# Patient Record
Sex: Female | Born: 1940 | Race: White | Hispanic: No | Marital: Married | Smoking: Never smoker
Health system: Southern US, Community
[De-identification: ages and names within clinical notes are randomized; demographics above are authoritative.]

## PROBLEM LIST (undated history)

## (undated) DIAGNOSIS — K449 Diaphragmatic hernia without obstruction or gangrene: Secondary | ICD-10-CM

## (undated) DIAGNOSIS — R0600 Dyspnea, unspecified: Secondary | ICD-10-CM

## (undated) DIAGNOSIS — M810 Age-related osteoporosis without current pathological fracture: Secondary | ICD-10-CM

## (undated) DIAGNOSIS — T8859XA Other complications of anesthesia, initial encounter: Secondary | ICD-10-CM

## (undated) DIAGNOSIS — I10 Essential (primary) hypertension: Secondary | ICD-10-CM

## (undated) DIAGNOSIS — K219 Gastro-esophageal reflux disease without esophagitis: Secondary | ICD-10-CM

## (undated) DIAGNOSIS — E039 Hypothyroidism, unspecified: Secondary | ICD-10-CM

## (undated) DIAGNOSIS — M719 Bursopathy, unspecified: Secondary | ICD-10-CM

## (undated) DIAGNOSIS — I499 Cardiac arrhythmia, unspecified: Secondary | ICD-10-CM

## (undated) DIAGNOSIS — I4891 Unspecified atrial fibrillation: Secondary | ICD-10-CM

## (undated) DIAGNOSIS — T4145XA Adverse effect of unspecified anesthetic, initial encounter: Secondary | ICD-10-CM

## (undated) DIAGNOSIS — F419 Anxiety disorder, unspecified: Secondary | ICD-10-CM

## (undated) DIAGNOSIS — K5790 Diverticulosis of intestine, part unspecified, without perforation or abscess without bleeding: Secondary | ICD-10-CM

## (undated) DIAGNOSIS — D126 Benign neoplasm of colon, unspecified: Secondary | ICD-10-CM

## (undated) DIAGNOSIS — G7000 Myasthenia gravis without (acute) exacerbation: Secondary | ICD-10-CM

## (undated) DIAGNOSIS — H409 Unspecified glaucoma: Secondary | ICD-10-CM

## (undated) DIAGNOSIS — K56609 Unspecified intestinal obstruction, unspecified as to partial versus complete obstruction: Secondary | ICD-10-CM

## (undated) DIAGNOSIS — E785 Hyperlipidemia, unspecified: Secondary | ICD-10-CM

## (undated) DIAGNOSIS — N183 Chronic kidney disease, stage 3 unspecified: Secondary | ICD-10-CM

## (undated) DIAGNOSIS — M1990 Unspecified osteoarthritis, unspecified site: Secondary | ICD-10-CM

## (undated) DIAGNOSIS — K602 Anal fissure, unspecified: Secondary | ICD-10-CM

## (undated) DIAGNOSIS — J45909 Unspecified asthma, uncomplicated: Secondary | ICD-10-CM

## (undated) HISTORY — DX: Hyperlipidemia, unspecified: E78.5

## (undated) HISTORY — DX: Diverticulosis of intestine, part unspecified, without perforation or abscess without bleeding: K57.90

## (undated) HISTORY — DX: Unspecified intestinal obstruction, unspecified as to partial versus complete obstruction: K56.609

## (undated) HISTORY — DX: Unspecified glaucoma: H40.9

## (undated) HISTORY — PX: TONSILLECTOMY: SUR1361

## (undated) HISTORY — DX: Myasthenia gravis without (acute) exacerbation: G70.00

## (undated) HISTORY — PX: APPENDECTOMY: SHX54

## (undated) HISTORY — DX: Chronic kidney disease, stage 3 unspecified: N18.30

## (undated) HISTORY — DX: Unspecified osteoarthritis, unspecified site: M19.90

## (undated) HISTORY — PX: EYE SURGERY: SHX253

## (undated) HISTORY — DX: Hypothyroidism, unspecified: E03.9

## (undated) HISTORY — DX: Diaphragmatic hernia without obstruction or gangrene: K44.9

## (undated) HISTORY — DX: Anal fissure, unspecified: K60.2

## (undated) HISTORY — DX: Age-related osteoporosis without current pathological fracture: M81.0

## (undated) HISTORY — DX: Bursopathy, unspecified: M71.9

## (undated) HISTORY — DX: Chronic kidney disease, stage 3 (moderate): N18.3

## (undated) HISTORY — DX: Unspecified asthma, uncomplicated: J45.909

## (undated) HISTORY — PX: BREAST LUMPECTOMY: SHX2

## (undated) HISTORY — PX: ABDOMINAL HYSTERECTOMY: SHX81

## (undated) HISTORY — DX: Benign neoplasm of colon, unspecified: D12.6

## (undated) HISTORY — DX: Essential (primary) hypertension: I10

## (undated) HISTORY — DX: Unspecified atrial fibrillation: I48.91

---

## 1997-11-05 HISTORY — PX: THYROIDECTOMY, PARTIAL: SHX18

## 2005-01-15 ENCOUNTER — Ambulatory Visit: Payer: Self-pay | Admitting: Family Medicine

## 2005-01-22 ENCOUNTER — Ambulatory Visit: Payer: Self-pay | Admitting: Family Medicine

## 2006-01-24 ENCOUNTER — Ambulatory Visit: Payer: Self-pay | Admitting: Family Medicine

## 2006-01-31 ENCOUNTER — Ambulatory Visit: Payer: Self-pay | Admitting: Family Medicine

## 2006-07-17 ENCOUNTER — Ambulatory Visit: Payer: Self-pay | Admitting: Family Medicine

## 2006-09-13 ENCOUNTER — Ambulatory Visit: Payer: Self-pay | Admitting: Family Medicine

## 2007-02-14 ENCOUNTER — Ambulatory Visit: Payer: Self-pay | Admitting: Family Medicine

## 2007-02-14 LAB — CONVERTED CEMR LAB
BUN: 18 mg/dL (ref 6–23)
Basophils Relative: 0.3 % (ref 0.0–1.0)
Bilirubin, Direct: 0.1 mg/dL (ref 0.0–0.3)
CO2: 30 meq/L (ref 19–32)
Cholesterol: 164 mg/dL (ref 0–200)
Creatinine, Ser: 1.1 mg/dL (ref 0.4–1.2)
GFR calc Af Amer: 64 mL/min
Glucose, Bld: 84 mg/dL (ref 70–99)
HCT: 43.5 % (ref 36.0–46.0)
HDL: 57.1 mg/dL (ref >39.0)
Hemoglobin: 14.7 g/dL (ref 12.0–15.0)
LDL Cholesterol: 80 mg/dL (ref 0–99)
Lymphocytes Relative: 24.6 % (ref 12.0–46.0)
Monocytes Absolute: 0.4 10*3/uL (ref 0.2–0.7)
Monocytes Relative: 5.7 % (ref 3.0–11.0)
Neutro Abs: 4.4 10*3/uL (ref 1.4–7.7)
Neutrophils Relative %: 68.7 % (ref 43.0–77.0)
Potassium: 4 meq/L (ref 3.5–5.1)
RDW: 12.6 % (ref 11.5–14.6)
Sodium: 144 meq/L (ref 135–145)
TSH: 0.51 microintl units/mL (ref 0.35–5.50)
Total Bilirubin: 1.3 mg/dL — ABNORMAL HIGH (ref 0.3–1.2)
Total Protein: 7.2 g/dL (ref 6.0–8.3)
VLDL: 27 mg/dL (ref 0–40)

## 2007-02-24 ENCOUNTER — Ambulatory Visit: Payer: Self-pay

## 2007-02-25 ENCOUNTER — Ambulatory Visit: Payer: Self-pay | Admitting: Gastroenterology

## 2007-03-05 ENCOUNTER — Ambulatory Visit: Payer: Self-pay | Admitting: Gastroenterology

## 2007-04-02 ENCOUNTER — Ambulatory Visit: Payer: Self-pay | Admitting: Gastroenterology

## 2007-04-02 LAB — CONVERTED CEMR LAB
OCCULT 1: NEGATIVE
OCCULT 2: NEGATIVE
OCCULT 5: NEGATIVE

## 2007-07-16 DIAGNOSIS — E039 Hypothyroidism, unspecified: Secondary | ICD-10-CM | POA: Insufficient documentation

## 2007-07-16 DIAGNOSIS — I10 Essential (primary) hypertension: Secondary | ICD-10-CM | POA: Insufficient documentation

## 2007-07-16 DIAGNOSIS — M810 Age-related osteoporosis without current pathological fracture: Secondary | ICD-10-CM | POA: Insufficient documentation

## 2007-07-16 DIAGNOSIS — E785 Hyperlipidemia, unspecified: Secondary | ICD-10-CM | POA: Insufficient documentation

## 2007-09-18 ENCOUNTER — Ambulatory Visit: Payer: Self-pay | Admitting: Family Medicine

## 2007-09-25 ENCOUNTER — Ambulatory Visit: Payer: Self-pay | Admitting: Family Medicine

## 2007-11-17 ENCOUNTER — Ambulatory Visit: Payer: Self-pay | Admitting: Family Medicine

## 2007-11-17 DIAGNOSIS — L723 Sebaceous cyst: Secondary | ICD-10-CM

## 2007-11-17 DIAGNOSIS — I059 Rheumatic mitral valve disease, unspecified: Secondary | ICD-10-CM | POA: Insufficient documentation

## 2008-05-04 ENCOUNTER — Telehealth: Payer: Self-pay | Admitting: Family Medicine

## 2008-06-15 ENCOUNTER — Telehealth: Payer: Self-pay | Admitting: Family Medicine

## 2008-06-22 ENCOUNTER — Encounter: Payer: Self-pay | Admitting: Family Medicine

## 2008-07-01 ENCOUNTER — Ambulatory Visit: Payer: Self-pay | Admitting: Family Medicine

## 2008-07-01 DIAGNOSIS — M1990 Unspecified osteoarthritis, unspecified site: Secondary | ICD-10-CM | POA: Insufficient documentation

## 2008-07-02 ENCOUNTER — Encounter: Payer: Self-pay | Admitting: Family Medicine

## 2008-07-05 LAB — CONVERTED CEMR LAB
AST: 24 units/L (ref 0–37)
Alkaline Phosphatase: 48 units/L (ref 39–117)
Bilirubin, Direct: 0.1 mg/dL (ref 0.0–0.3)
Chloride: 109 meq/L (ref 96–112)
Eosinophils Absolute: 0 10*3/uL (ref 0.0–0.7)
GFR calc Af Amer: 64 mL/min
GFR calc non Af Amer: 53 mL/min
Glucose, Bld: 108 mg/dL — ABNORMAL HIGH (ref 70–99)
HCT: 40.9 % (ref 36.0–46.0)
HDL: 68 mg/dL (ref >39.0)
LDL Cholesterol: 97 mg/dL (ref 0–99)
Monocytes Absolute: 0.4 10*3/uL (ref 0.1–1.0)
Monocytes Relative: 5.3 % (ref 3.0–12.0)
Neutrophils Relative %: 77.7 % — ABNORMAL HIGH (ref 43.0–77.0)
Platelets: 198 10*3/uL (ref 150–400)
Potassium: 4 meq/L (ref 3.5–5.1)
RDW: 12.6 % (ref 11.5–14.6)
Sodium: 141 meq/L (ref 135–145)
Total CHOL/HDL Ratio: 2.9
Triglycerides: 148 mg/dL (ref 0–149)
VLDL: 30 mg/dL (ref 0–40)
WBC: 7.8 10*3/uL (ref 4.5–10.5)

## 2008-12-20 ENCOUNTER — Encounter: Payer: Self-pay | Admitting: Gastroenterology

## 2009-01-06 ENCOUNTER — Ambulatory Visit: Payer: Self-pay | Admitting: Gastroenterology

## 2009-01-07 ENCOUNTER — Encounter: Payer: Self-pay | Admitting: Family Medicine

## 2009-01-07 ENCOUNTER — Encounter: Admission: RE | Admit: 2009-01-07 | Discharge: 2009-01-07 | Payer: Self-pay | Admitting: Orthopedic Surgery

## 2009-01-31 ENCOUNTER — Telehealth: Payer: Self-pay | Admitting: Gastroenterology

## 2009-02-16 ENCOUNTER — Ambulatory Visit: Payer: Self-pay | Admitting: Family Medicine

## 2009-02-16 DIAGNOSIS — K219 Gastro-esophageal reflux disease without esophagitis: Secondary | ICD-10-CM | POA: Insufficient documentation

## 2009-02-21 ENCOUNTER — Telehealth: Payer: Self-pay | Admitting: Family Medicine

## 2009-02-22 ENCOUNTER — Telehealth: Payer: Self-pay | Admitting: Family Medicine

## 2009-06-23 ENCOUNTER — Encounter: Payer: Self-pay | Admitting: Family Medicine

## 2009-07-01 ENCOUNTER — Telehealth: Payer: Self-pay | Admitting: Family Medicine

## 2009-07-13 ENCOUNTER — Ambulatory Visit: Payer: Self-pay | Admitting: Family Medicine

## 2009-07-13 DIAGNOSIS — K644 Residual hemorrhoidal skin tags: Secondary | ICD-10-CM | POA: Insufficient documentation

## 2010-06-05 ENCOUNTER — Ambulatory Visit: Payer: Self-pay | Admitting: Family Medicine

## 2010-06-05 DIAGNOSIS — R05 Cough: Secondary | ICD-10-CM

## 2010-06-05 DIAGNOSIS — R059 Cough, unspecified: Secondary | ICD-10-CM | POA: Insufficient documentation

## 2010-06-13 ENCOUNTER — Telehealth: Payer: Self-pay | Admitting: Family Medicine

## 2010-06-20 ENCOUNTER — Ambulatory Visit: Payer: Self-pay | Admitting: Family Medicine

## 2010-06-23 ENCOUNTER — Ambulatory Visit: Payer: Self-pay | Admitting: Family Medicine

## 2010-06-23 ENCOUNTER — Telehealth: Payer: Self-pay | Admitting: Family Medicine

## 2010-06-23 DIAGNOSIS — R000 Tachycardia, unspecified: Secondary | ICD-10-CM

## 2010-06-29 ENCOUNTER — Encounter: Payer: Self-pay | Admitting: Family Medicine

## 2010-07-14 ENCOUNTER — Telehealth: Payer: Self-pay | Admitting: Family Medicine

## 2010-07-14 ENCOUNTER — Ambulatory Visit: Payer: Self-pay | Admitting: Family Medicine

## 2010-07-14 DIAGNOSIS — K5730 Diverticulosis of large intestine without perforation or abscess without bleeding: Secondary | ICD-10-CM | POA: Insufficient documentation

## 2010-07-14 DIAGNOSIS — J45909 Unspecified asthma, uncomplicated: Secondary | ICD-10-CM | POA: Insufficient documentation

## 2010-07-17 DIAGNOSIS — K469 Unspecified abdominal hernia without obstruction or gangrene: Secondary | ICD-10-CM | POA: Insufficient documentation

## 2010-07-17 LAB — CONVERTED CEMR LAB
AST: 22 units/L (ref 0–37)
Alkaline Phosphatase: 49 units/L (ref 39–117)
BUN: 21 mg/dL (ref 6–23)
Basophils Absolute: 0 10*3/uL (ref 0.0–0.1)
Calcium: 10 mg/dL (ref 8.4–10.5)
Cholesterol: 155 mg/dL (ref 0–200)
GFR calc non Af Amer: 46.41 mL/min (ref >60)
Glucose, Bld: 93 mg/dL (ref 70–99)
HDL: 50 mg/dL (ref >39.00)
Lymphocytes Relative: 24.9 % (ref 12.0–46.0)
Monocytes Relative: 7.2 % (ref 3.0–12.0)
Neutrophils Relative %: 66.2 % (ref 43.0–77.0)
Platelets: 183 10*3/uL (ref 150.0–400.0)
RDW: 14.1 % (ref 11.5–14.6)
Sodium: 142 meq/L (ref 135–145)
Total Bilirubin: 1.4 mg/dL — ABNORMAL HIGH (ref 0.3–1.2)
VLDL: 29.2 mg/dL (ref 0.0–40.0)

## 2010-07-24 ENCOUNTER — Telehealth: Payer: Self-pay | Admitting: Family Medicine

## 2010-07-31 ENCOUNTER — Ambulatory Visit: Payer: Self-pay | Admitting: Family Medicine

## 2010-07-31 DIAGNOSIS — B029 Zoster without complications: Secondary | ICD-10-CM

## 2010-08-28 ENCOUNTER — Encounter: Admission: RE | Admit: 2010-08-28 | Discharge: 2010-08-28 | Payer: Self-pay | Admitting: Surgery

## 2010-09-06 ENCOUNTER — Encounter: Payer: Self-pay | Admitting: Family Medicine

## 2010-09-12 ENCOUNTER — Encounter (INDEPENDENT_AMBULATORY_CARE_PROVIDER_SITE_OTHER): Payer: Self-pay | Admitting: *Deleted

## 2010-09-15 ENCOUNTER — Encounter (INDEPENDENT_AMBULATORY_CARE_PROVIDER_SITE_OTHER): Payer: Self-pay | Admitting: *Deleted

## 2010-09-19 ENCOUNTER — Ambulatory Visit: Payer: Self-pay | Admitting: Gastroenterology

## 2010-09-27 ENCOUNTER — Ambulatory Visit: Payer: Self-pay | Admitting: Gastroenterology

## 2010-09-27 HISTORY — PX: COLONOSCOPY: SHX174

## 2010-10-03 ENCOUNTER — Encounter: Payer: Self-pay | Admitting: Gastroenterology

## 2010-10-18 ENCOUNTER — Inpatient Hospital Stay (HOSPITAL_COMMUNITY)
Admission: RE | Admit: 2010-10-18 | Discharge: 2010-10-20 | Payer: Self-pay | Source: Home / Self Care | Attending: Surgery | Admitting: Surgery

## 2010-10-18 HISTORY — PX: HERNIA REPAIR: SHX51

## 2010-11-09 ENCOUNTER — Encounter: Payer: Self-pay | Admitting: Family Medicine

## 2010-11-14 ENCOUNTER — Ambulatory Visit
Admission: RE | Admit: 2010-11-14 | Discharge: 2010-11-14 | Payer: Self-pay | Source: Home / Self Care | Attending: Family Medicine | Admitting: Family Medicine

## 2010-11-25 ENCOUNTER — Encounter: Payer: Self-pay | Admitting: Family Medicine

## 2010-12-03 LAB — CONVERTED CEMR LAB
AST: 23 units/L (ref 0–37)
BUN: 25 mg/dL — ABNORMAL HIGH (ref 6–23)
Basophils Absolute: 0 10*3/uL (ref 0.0–0.1)
Bilirubin Urine: NEGATIVE
Blood in Urine, dipstick: NEGATIVE
Calcium: 9.6 mg/dL (ref 8.4–10.5)
Cholesterol: 160 mg/dL (ref 0–200)
Creatinine, Ser: 1.3 mg/dL — ABNORMAL HIGH (ref 0.4–1.2)
GFR calc non Af Amer: 43.26 mL/min (ref >60)
Glucose, Bld: 110 mg/dL — ABNORMAL HIGH (ref 70–99)
Glucose, Urine, Semiquant: NEGATIVE
HCT: 42.6 % (ref 36.0–46.0)
HDL: 59.1 mg/dL (ref >39.00)
Lymphocytes Relative: 16.5 % (ref 12.0–46.0)
Lymphs Abs: 1.5 10*3/uL (ref 0.7–4.0)
Monocytes Relative: 5.4 % (ref 3.0–12.0)
Neutrophils Relative %: 77.5 % — ABNORMAL HIGH (ref 43.0–77.0)
Platelets: 200 10*3/uL (ref 150.0–400.0)
Protein, U semiquant: NEGATIVE
RDW: 12.9 % (ref 11.5–14.6)
TSH: 1.48 microintl units/mL (ref 0.35–5.50)
Total Bilirubin: 1.4 mg/dL — ABNORMAL HIGH (ref 0.3–1.2)
Triglycerides: 86 mg/dL (ref 0.0–149.0)
VLDL: 17.2 mg/dL (ref 0.0–40.0)
pH: 5.5

## 2010-12-05 NOTE — Letter (Signed)
Summary: [facility]   Imported By: Maryln Gottron 09/22/2010 10:56:49  _____________________________________________________________________  External Attachment:    Type:   Image     Comment:   External Document

## 2010-12-05 NOTE — Assessment & Plan Note (Signed)
Summary: long history of ? wheeze???/dm   Vital Signs:  Patient profile:   70 year old female Weight:      184 pounds O2 Sat:      94 % Temp:     98.3 degrees F oral Pulse rate:   88 / minute BP sitting:   122 / 90  (left arm) Cuff size:   regular  Vitals Entered By: Kathrynn Speed CMA (June 05, 2010 10:33 AM) CC: Long history of wheeze, started one year ago, worse at night, some cough,src   History of Present Illness: Here for a dry tickling cough that she has had almost every day for the past 9 months. She may wheeze at times with it. It is nonproductive, no chest pains, no SOB. No GERD symptoms.  Current Medications (verified): 1)  Multivitamins   Tabs (Multiple Vitamin) .Marland Kitchen.. 1 By Mouth Once Daily 2)  Actonel 35 Mg  Tabs (Risedronate Sodium) .... Once Weekly 3)  Triamterene-Hctz 37.5-25 Mg  Tabs (Triamterene-Hctz) .... Take 1 Tablet By Mouth Once A Day 4)  Lipitor 10 Mg  Tabs (Atorvastatin Calcium) .... Take 1 Tablet By Mouth Once A Day 5)  Synthroid 100 Mcg  Tabs (Levothyroxine Sodium) .Marland Kitchen.. 1 By Mouth Once Daily 6)  Lotrel 5-20 Mg Caps (Amlodipine Besy-Benazepril Hcl) .... Once Daily 7)  Aspirin 81 Mg  Tbec (Aspirin) .... One By Mouth Every Day 8)  Omeprazole 40 Mg Cpdr (Omeprazole) .... Once Daily  Allergies (verified): 1)  ! Darvon 2)  ! Demerol (Meperidine Hcl)  Past History:  Past Medical History: Reviewed history from 02/16/2009 and no changes required. Hyperlipidemia Hypertension Hypothyroidism Osteopenia, last DEXA 12-20-05 normal cardiac stress test 02-24-07 Osteoarthritis, sees Dr. Judson Roch  Past Surgical History: Reviewed history from 07/01/2008 and no changes required. Appendectomy Hysterectomy Tonsillectomy 7 benign lumps removed from breast Thyroidectomy partial colonoscopy 5-08  per Dr. Jarold Motto, repeat 3 years  Review of Systems  The patient denies anorexia, fever, weight loss, weight gain, vision loss, decreased hearing, hoarseness, chest  pain, syncope, dyspnea on exertion, peripheral edema, headaches, hemoptysis, abdominal pain, melena, hematochezia, severe indigestion/heartburn, hematuria, incontinence, genital sores, muscle weakness, suspicious skin lesions, transient blindness, difficulty walking, depression, unusual weight change, abnormal bleeding, enlarged lymph nodes, angioedema, breast masses, and testicular masses.    Physical Exam  General:  Well-developed,well-nourished,in no acute distress; alert,appropriate and cooperative throughout examination Neck:  No deformities, masses, or tenderness noted. Lungs:  Normal respiratory effort, chest expands symmetrically. Lungs are clear to auscultation, no crackles or wheezes. Heart:  Normal rate and regular rhythm. S1 and S2 normal without gallop, murmur, click, rub or other extra sounds.   Impression & Recommendations:  Problem # 1:  COUGH (ICD-786.2)  Problem # 2:  HYPERTENSION (ICD-401.9)  The following medications were removed from the medication list:    Lotrel 5-20 Mg Caps (Amlodipine besy-benazepril hcl) ..... Once daily Her updated medication list for this problem includes:    Triamterene-hctz 37.5-25 Mg Tabs (Triamterene-hctz) .Marland Kitchen... Take 1 tablet by mouth once a day    Amlodipine Besylate 5 Mg Tabs (Amlodipine besylate) ..... Once daily    Losartan Potassium 50 Mg Tabs (Losartan potassium) ..... Once daily  Orders: Prescription Created Electronically [phone])  Complete Medication List: 1)  Multivitamins Tabs (Multiple vitamin) .Marland Kitchen.. 1 by mouth once daily 2)  Actonel 35 Mg Tabs (Risedronate sodium) .... Once weekly 3)  Triamterene-hctz 37.5-25 Mg Tabs (Triamterene-hctz) .... Take 1 tablet by mouth once a day 4)  Lipitor 10  Mg Tabs (Atorvastatin calcium) .... Take 1 tablet by mouth once a day 5)  Synthroid 100 Mcg Tabs (Levothyroxine sodium) .Marland Kitchen.. 1 by mouth once daily 6)  Aspirin 81 Mg Tbec (Aspirin) .... One by mouth every day 7)  Omeprazole 40 Mg Cpdr  (Omeprazole) .... Once daily 8)  Amlodipine Besylate 5 Mg Tabs (Amlodipine besylate) .... Once daily 9)  Losartan Potassium 50 Mg Tabs (Losartan potassium) .... Once daily  Patient Instructions: 1)  This is consistent with an ACE inhibitor cough. Stop Benazepril and substitute Losartan.  2)  Please schedule a follow-up appointment in 1 month.  Prescriptions: LOSARTAN POTASSIUM 50 MG TABS (LOSARTAN POTASSIUM) once daily  #30 x 11   Entered and Authorized by:   Nelwyn Salisbury MD   Signed by:   Nelwyn Salisbury MD on 06/05/2010   Method used:   Electronically to        [facility] [phone]* (retail)       [address]       [place], [place]  [address]       Ph: [phone] or [phone]       Fax: [phone]   RxID:   [id number] AMLODIPINE BESYLATE 5 MG TABS (AMLODIPINE BESYLATE) once daily  #30 x 11   Entered and Authorized by:   Nelwyn Salisbury MD   Signed by:   Nelwyn Salisbury MD on 06/05/2010   Method used:   Electronically to        [facility] [phone][facility] (retail)       [address]       [place], [place]  [address]       Ph: [phone] or [phone]       Fax: [phone]   RxID:   [id number]

## 2010-12-05 NOTE — Assessment & Plan Note (Signed)
Summary: discuss meds/dm   Vital Signs:  Patient profile:   70 year old female Weight:      187 pounds BMI:     30.29 Pulse rate:   100 / minute Pulse rhythm:   irregular BP sitting:   156 / 82  (left arm) Cuff size:   regular  Vitals Entered By: Raechel Ache, RN (June 23, 2010 2:25 PM) CC: C/o fluctuating BP, fast pulse and palpitations.   History of Present Illness: Here to follow up on tachycardia and HTN. We recently increased her Amlodipine from 5 to 10 mg a day, but her BP has still been up to 150 systolic and her pulse oftens runs in the 90-100 range. No SOB or chest pain.   Allergies: 1)  ! Darvon 2)  ! Demerol (Meperidine Hcl)  Past History:  Past Medical History: Reviewed history from 02/16/2009 and no changes required. Hyperlipidemia Hypertension Hypothyroidism Osteopenia, last DEXA 12-20-05 normal cardiac stress test 02-24-07 Osteoarthritis, sees Dr. Judson Roch  Review of Systems  The patient denies anorexia, fever, weight loss, weight gain, vision loss, decreased hearing, hoarseness, chest pain, syncope, dyspnea on exertion, peripheral edema, prolonged cough, headaches, hemoptysis, abdominal pain, melena, hematochezia, severe indigestion/heartburn, hematuria, incontinence, genital sores, muscle weakness, suspicious skin lesions, transient blindness, difficulty walking, depression, unusual weight change, abnormal bleeding, enlarged lymph nodes, angioedema, breast masses, and testicular masses.    Physical Exam  General:  Well-developed,well-nourished,in no acute distress; alert,appropriate and cooperative throughout examination Neck:  No deformities, masses, or tenderness noted. Lungs:  Normal respiratory effort, chest expands symmetrically. Lungs are clear to auscultation, no crackles or wheezes. Heart:  mildly high rate, regular rhythm, no murmur, no gallop, no rub, and no JVD.  EKG shows sinus tachycardia with a single PAC   Impression &  Recommendations:  Problem # 1:  TACHYCARDIA (ICD-785.0)  Orders: EKG w/ Interpretation (93000)  Problem # 2:  HYPERTENSION (ICD-401.9)  The following medications were removed from the medication list:    Amlodipine Besylate 10 Mg Tabs (Amlodipine besylate) ..... Once daily Her updated medication list for this problem includes:    Triamterene-hctz 37.5-25 Mg Tabs (Triamterene-hctz) .Marland Kitchen... Take 1 tablet by mouth once a day    Losartan Potassium 50 Mg Tabs (Losartan potassium) ..... Once daily    Metoprolol Succinate 50 Mg Xr24h-tab (Metoprolol succinate) ..... Once daily  Complete Medication List: 1)  Multivitamins Tabs (Multiple vitamin) .Marland Kitchen.. 1 by mouth once daily 2)  Actonel 35 Mg Tabs (Risedronate sodium) .... Once weekly 3)  Triamterene-hctz 37.5-25 Mg Tabs (Triamterene-hctz) .... Take 1 tablet by mouth once a day 4)  Lipitor 10 Mg Tabs (Atorvastatin calcium) .... Take 1 tablet by mouth once a day 5)  Synthroid 100 Mcg Tabs (Levothyroxine sodium) .Marland Kitchen.. 1 by mouth once daily 6)  Aspirin 81 Mg Tbec (Aspirin) .... One by mouth every day 7)  Omeprazole 40 Mg Cpdr (Omeprazole) .... Once daily 8)  Losartan Potassium 50 Mg Tabs (Losartan potassium) .... Once daily 9)  Metoprolol Succinate 50 Mg Xr24h-tab (Metoprolol succinate) .... Once daily  Patient Instructions: 1)  We will stop the Amlodipine and start on Metoprolol succinate. She will call me in one week with an update, and she will return in 3 weeks for her cpx. Prescriptions: METOPROLOL SUCCINATE 50 MG XR24H-TAB (METOPROLOL SUCCINATE) once daily  #30 x 2   Entered and Authorized by:   Nelwyn Salisbury MD   Signed by:   Nelwyn Salisbury MD on 06/23/2010  Method used:   [facility] to        [facility]       [address]       [place], [place]  [address]       Ph: [phone] or [phone]       Fax: [phone]   RxID:   [id number]

## 2010-12-05 NOTE — Progress Notes (Signed)
Summary: Call-A-Nurse Report    Call-A-Nurse Triage Call Report Triage Record Num: [id number] Operator: Migdalia Dk Patient Name: Christina Jacobs Call Date & Time: 07/23/2010 4:45:24AM Patient Phone: [phone] PCP: Tera Mater. Clent Ridges Patient Gender: Female PCP Fax : [phone] Patient DOB: [birth year] [facility] Name: [facility] Reason for Call: Husband states pt. c/o severe pain in left lower back. 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 toconsult with surgeon, but pain severe tonight. Unable to attain position of comfort. Aleve for pain without relief. Husband to take pt. to ER for evaluation. Protocol(s) Used: Back Symptoms Recommended Outcome per Protocol: See ED Immediately Reason for Outcome: New onset of severe disabling back pain (unable to stand upright) Care Advice:  ~ 07/23/2010 4:49:21AM Page 1 of 1 [id number]

## 2010-12-05 NOTE — Assessment & Plan Note (Signed)
Summary: SHINGLES? // RS   Vital Signs:  Patient profile:   70 year old female Weight:      189 pounds O2 Sat:      95 % Temp:     98.3 degrees F Pulse rate:   88 / minute BP sitting:   140 / 94  (left arm) Cuff size:   large  Vitals Entered By: Pura Spice, RN (July 31, 2010 2:42 PM) CC: was seen at [facility] --shingles low back    History of Present Illness: Here to follow up on shingles which was diagnosed one week ago at an Urgent Care clinic. She developed sharp burning pains along with a rash on the right side of her trunk about 10 days ago. At the clinic she was put on a 7 day course of Valtrex and some Gabapentin. She was not given any steroids. The rash is drying up a little now but the pain persists.   Allergies: 1)  ! Darvon 2)  ! Demerol (Meperidine Hcl) 3)  ! Codeine  Past History:  Past Medical History: Reviewed history from 02/16/2009 and no changes required. Hyperlipidemia Hypertension Hypothyroidism Osteopenia, last DEXA 12-20-05 normal cardiac stress test 02-24-07 Osteoarthritis, sees Dr. Judson Roch  Review of Systems  The patient denies anorexia, fever, weight loss, weight gain, vision loss, decreased hearing, hoarseness, chest pain, syncope, dyspnea on exertion, peripheral edema, prolonged cough, headaches, hemoptysis, abdominal pain, melena, hematochezia, severe indigestion/heartburn, hematuria, incontinence, genital sores, muscle weakness, suspicious skin lesions, transient blindness, difficulty walking, depression, unusual weight change, abnormal bleeding, enlarged lymph nodes, angioedema, breast masses, and testicular masses.    Physical Exam  General:  Well-developed,well-nourished,in no acute distress; alert,appropriate and cooperative throughout examination Skin:  scattered vessicular red rash over the right side of her trunk   Impression & Recommendations:  Problem # 1:  SHINGLES (ICD-053.9)  Complete Medication List: 1)   Multivitamins Tabs (Multiple vitamin) .Marland Kitchen.. 1 by mouth once daily 2)  Actonel 35 Mg Tabs (Risedronate sodium) .... Once weekly 3)  Triamterene-hctz 37.5-25 Mg Tabs (Triamterene-hctz) .... Take 1 tablet by mouth once a day 4)  Lipitor 10 Mg Tabs (Atorvastatin calcium) .... Take 1 tablet by mouth once a day 5)  Synthroid 100 Mcg Tabs (Levothyroxine sodium) .Marland Kitchen.. 1 by mouth once daily 6)  Aspirin 81 Mg Tbec (Aspirin) .... One by mouth every day 7)  Omeprazole 40 Mg Cpdr (Omeprazole) .... Once daily 8)  Losartan Potassium 50 Mg Tabs (Losartan potassium) .... Once daily 9)  Metoprolol Succinate 50 Mg Xr24h-tab (Metoprolol succinate) .... Once daily 10)  Levaquin 500 Mg Tabs (Levofloxacin) .... Once daily 11)  Valacyclovir Hcl 1 Gm Tabs (Valacyclovir hcl) .... As directed 12)  Lidoderm 5 % Ptch (Lidocaine) 13)  Prednisone (pak) 10 Mg Tabs (Prednisone) .... As directed for 12 days 14)  Tramadol Hcl 50 Mg Tabs (Tramadol hcl) .Marland Kitchen.. 1 or 2 tabs q 4 hours as needed pain  Patient Instructions: 1)  Add a dose pack of Prednisone to reduce inflammation and some Tramadol for the pain.  2)  Please schedule a follow-up appointment as needed .  Prescriptions: TRAMADOL HCL 50 MG TABS (TRAMADOL HCL) 1 or 2 tabs q 4 hours as needed pain  #60 x 5   Entered and Authorized by:   Nelwyn Salisbury MD   Signed by:   Nelwyn Salisbury MD on 07/31/2010   Method used:   Print then Give to Patient   RxID:   [id number] PREDNISONE (  PAK) 10 MG TABS (PREDNISONE) as directed for 12 days  #1 x 0   Entered and Authorized by:   Nelwyn Salisbury MD   Signed by:   Nelwyn Salisbury MD on 07/31/2010   Method used:   Print then Give to Patient   RxID:   [id number]

## 2010-12-05 NOTE — Procedures (Signed)
Summary: Colonoscopy  Patient: Christina Jacobs Note: All result statuses are Final unless otherwise noted.  Tests: (1) Colonoscopy (COL)   COL Colonoscopy           DONE     [facility]     [address]     [place], [place]  [address]           COLONOSCOPY PROCEDURE REPORT           PATIENT:  Christina Jacobs, Christina Jacobs  MR#:  [id number]     BIRTHDATE:  April 26, 1941, 69 yrs. old  GENDER:  female     ENDOSCOPIST:  Vania Rea. Jarold Motto, MD, [facility]     REF. BY:  Tera Mater. Clent Ridges, M.D.     PROCEDURE DATE:  09/27/2010     PROCEDURE:  Colonoscopy with snare polypectomy     ASA CLASS:  Class II     INDICATIONS:  Elevated Risk Screening ++ FH.SHE HAS A LARGE     DIAPHRAGMATIC HERNIA AND PLANNED SURGICAL REPAIR.     MEDICATIONS:   Fentanyl 125 mcg IV, Versed 12 mg IV           DESCRIPTION OF PROCEDURE:   After the risks benefits and     alternatives of the procedure were thoroughly explained, informed     consent was obtained.  Digital rectal exam was performed and     revealed no abnormalities.   The [id number] endoscope was     introduced through the anus and advanced to the cecum, which was     identified by both the appendix and ileocecal valve, limited by a     redundant colon.  COLON SEEMS TO LOOP INTO THE INTERNAL HERNIA     REPEATEDLY.VERY HARD EXAM.  The quality of the prep was excellent,     using MoviPrep.  The instrument was then slowly withdrawn as the     colon was fully examined.     <<PROCEDUREIMAGES>>           FINDINGS:  Severe diverticulosis was found in the sigmoid to     descending colon segments. INFLAMMED 1 CM POLYP HOT SNARE REMOVED     FROM MIDSIGMOID AREA.  no active bleeding or blood in c.   SCARRED     RECTUM.SEE PICTURES,,NO OTHER LESIONS NOTED.  The scope was then     withdrawn from the patient and the procedure completed.           COMPLICATIONS:  None     ENDOSCOPIC IMPRESSION:     1) Severe diverticulosis in the sigmoid to descending colon     segments     2) No active bleeding or blood in c     POLYP.R/O ADENOMA     RECOMMENDATIONS:     1) high fiber diet     2) Repeat Colonoscopy in 5 years.     3) Await pathology results     REPEAT EXAM:  No           ______________________________     Vania Rea. Jarold Motto, MD, [facility]           CC:  Luretha Murphy, MD           n.     Rosalie Doctor:   Vania Rea. Patterson at 09/27/2010 11:24 AM           Elbert Ewings, [id number]  Note: An exclamation mark (!) indicates a result that was not dispersed into the flowsheet.  Document Creation Date: 09/27/2010 11:24 AM _______________________________________________________________________  (1) Order result status: Final Collection or observation date-time: 09/27/2010 11:14 Requested date-time:  Receipt date-time:  Reported date-time:  Referring Physician:   Ordering Physician: Sheryn Bison [phone]) Specimen Source:  Source: Launa Grill Order Number: [id number] Lab site:   Appended Document: Colonoscopy     Procedures Next Due Date:    Colonoscopy: 10/2015

## 2010-12-05 NOTE — Letter (Signed)
Summary: Moviprep Instructions  [facility]  [address]   [place], [place] [address]   Phone: [phone]  Fax: [phone]       Christina Jacobs    12/02/1954    MRN: [id number]        Procedure Day Dorna Bloom: Wednesday, 09-27-10     Arrival Time: 9:30 a.m.     Procedure Time: 10:30 a.m.     Location of Procedure:                    x   [facility] (4th Floor)                        PREPARATION FOR COLONOSCOPY WITH MOVIPREP   Starting 5 days prior to your procedure 09-22-10 do not eat nuts, seeds, popcorn, corn, beans, peas,  salads, or any raw vegetables.  Do not take any fiber supplements (e.g. Metamucil, Citrucel, and Benefiber).  THE DAY BEFORE YOUR PROCEDURE         DATE: 09-26-10  DAY: Tuesday  1.  Drink clear liquids the entire day-NO SOLID FOOD  2.  Do not drink anything colored red or purple.  Avoid juices with pulp.  No orange juice.  3.  Drink at least 64 oz. (8 glasses) of fluid/clear liquids during the day to prevent dehydration and help the prep work efficiently.  CLEAR LIQUIDS INCLUDE: Water Jello Ice Popsicles Tea (sugar ok, no milk/cream) Powdered fruit flavored drinks Coffee (sugar ok, no milk/cream) Gatorade Juice: apple, white grape, white cranberry  Lemonade Clear bullion, consomm, broth Carbonated beverages (any kind) Strained chicken noodle soup Hard Candy                             4.  In the morning, mix first dose of MoviPrep solution:    Empty 1 Pouch A and 1 Pouch B into the disposable container    Add lukewarm drinking water to the top line of the container. Mix to dissolve    Refrigerate (mixed solution should be used within 24 hrs)  5.  Begin drinking the prep at 5:00 p.m. The MoviPrep container is divided by 4 marks.   Every 15 minutes drink the solution down to the next mark (approximately 8 oz) until the full liter is complete.   6.  Follow completed prep with 16 oz of clear liquid of your  choice (Nothing red or purple).  Continue to drink clear liquids until bedtime.  7.  Before going to bed, mix second dose of MoviPrep solution:    Empty 1 Pouch A and 1 Pouch B into the disposable container    Add lukewarm drinking water to the top line of the container. Mix to dissolve    Refrigerate  THE DAY OF YOUR PROCEDURE      DATE: 09-27-10  DAY: Wednesday  Beginning at 5:30 a.m. (5 hours before procedure):         1. Every 15 minutes, drink the solution down to the next mark (approx 8 oz) until the full liter is complete.  2. Follow completed prep with 16 oz. of clear liquid of your choice.    3. You may drink clear liquids until 8:30 a.m.  (2 HOURS BEFORE PROCEDURE).   MEDICATION INSTRUCTIONS  Unless otherwise instructed, you should take regular prescription medications with a small sip of water   as early as possible the  morning of your procedure.  Diabetic patients - see separate instructions.  Stop taking Plavix or Aggrenox on  _  _  (7 days before procedure).     Stop taking Coumadin on  _ _  (5 days before procedure).  Additional medication instructions: _         OTHER INSTRUCTIONS  You will need a responsible adult at least 70 years of age to accompany you and drive you home.   This person must remain in the waiting room during your procedure.  Wear loose fitting clothing that is easily removed.  Leave jewelry and other valuables at home.  However, you may wish to bring a book to read or  an iPod/MP3 player to listen to music as you wait for your procedure to start.  Remove all body piercing jewelry and leave at home.  Total time from sign-in until discharge is approximately 2-3 hours.  You should go home directly after your procedure and rest.  You can resume normal activities the  day after your procedure.  The day of your procedure you should not:   Drive   Make legal decisions   Operate machinery   Drink alcohol   Return to  work  You will receive specific instructions about eating, activities and medications before you leave.    The above instructions have been reviewed and explained to me by   _______________________    I fully understand and can verbalize these instructions _____________________________ Date _________

## 2010-12-05 NOTE — Assessment & Plan Note (Signed)
Summary: emp---will fast//ccm   Vital Signs:  Patient profile:   70 year old female Height:      67 inches Weight:      188 pounds O2 Sat:      93 % Temp:     97.9 degrees F Pulse rate:   99 / minute BP sitting:   130 / 94  (left arm) Cuff size:   large  Vitals Entered By: Pura Spice, RN (July 14, 2010 8:58 AM) CC: cpx no pap partial hyst. 1984.  c/o wheezing  pt is fasting Is Patient Diabetic? No   History of Present Illness: 70 yr old female for a cpx. Her only complaint today is continuing occasional wheezing at night. We stopped her ACE inhibitor a few months ago to see if this helped, but it did not. We have added metoprolol to her regimen, and this has helped quite a bit with her palpitations. She has no SOB or chest pains, and she has good exercise tolerance. Apparently she was to have had a colonoscopy last year, but she never received a notification.   Allergies: 1)  ! Darvon 2)  ! Demerol (Meperidine Hcl)  Past History:  Past Medical History: Reviewed history from 02/16/2009 and no changes required. Hyperlipidemia Hypertension Hypothyroidism Osteopenia, last DEXA 12-20-05 normal cardiac stress test 02-24-07 Osteoarthritis, sees Dr. Judson Roch  Past Surgical History: Reviewed history from 07/01/2008 and no changes required. Appendectomy Hysterectomy Tonsillectomy 7 benign lumps removed from breast Thyroidectomy partial colonoscopy 5-08  per Dr. Jarold Motto, repeat 3 years  Family History: Reviewed history from 11/17/2007 and no changes required. Family History Breast cancer 1st degree relative <50 Family History Diabetes 1st degree relative Family History High cholesterol Family History Hypertension  Social History: Reviewed history from 11/17/2007 and no changes required. Retired Married Never Smoked Alcohol use-yes Drug use-no  Review of Systems  The patient denies anorexia, fever, weight loss, weight gain, vision loss, decreased hearing,  hoarseness, chest pain, syncope, dyspnea on exertion, peripheral edema, prolonged cough, headaches, hemoptysis, abdominal pain, melena, hematochezia, severe indigestion/heartburn, hematuria, incontinence, genital sores, muscle weakness, suspicious skin lesions, transient blindness, difficulty walking, depression, unusual weight change, abnormal bleeding, enlarged lymph nodes, angioedema, breast masses, and testicular masses.    Physical Exam  General:  Well-developed,well-nourished,in no acute distress; alert,appropriate and cooperative throughout examination Head:  Normocephalic and atraumatic without obvious abnormalities. No apparent alopecia or balding. Eyes:  No corneal or conjunctival inflammation noted. EOMI. Perrla. Funduscopic exam benign, without hemorrhages, exudates or papilledema. Vision grossly normal. Ears:  External ear exam shows no significant lesions or deformities.  Otoscopic examination reveals clear canals, tympanic membranes are intact bilaterally without bulging, retraction, inflammation or discharge. Hearing is grossly normal bilaterally. Nose:  External nasal examination shows no deformity or inflammation. Nasal mucosa are pink and moist without lesions or exudates. Mouth:  Oral mucosa and oropharynx without lesions or exudates.  Teeth in good repair. Neck:  No deformities, masses, or tenderness noted. Chest Wall:  No deformities, masses, or tenderness noted. Breasts:  No mass, nodules, thickening, tenderness, bulging, retraction, inflamation, nipple discharge or skin changes noted.   Lungs:  Normal respiratory effort, chest expands symmetrically. Lungs are clear to auscultation, no crackles or wheezes. Heart:  Normal rate and regular rhythm. S1 and S2 normal without gallop, murmur, click, rub or other extra sounds. Abdomen:  Bowel sounds positive,abdomen soft and non-tender without masses, organomegaly or hernias noted. Msk:  No deformity or scoliosis noted of thoracic or  lumbar  spine.   Pulses:  R and L carotid,radial,femoral,dorsalis pedis and posterior tibial pulses are full and equal bilaterally Extremities:  No clubbing, cyanosis, edema, or deformity noted with normal full range of motion of all joints.   Neurologic:  No cranial nerve deficits noted. Station and gait are normal. Plantar reflexes are down-going bilaterally. DTRs are symmetrical throughout. Sensory, motor and coordinative functions appear intact. Skin:  Intact without suspicious lesions or rashes Cervical Nodes:  No lymphadenopathy noted Axillary Nodes:  No palpable lymphadenopathy Inguinal Nodes:  No significant adenopathy Psych:  Cognition and judgment appear intact. Alert and cooperative with normal attention span and concentration. No apparent delusions, illusions, hallucinations   Impression & Recommendations:  Problem # 1:  TACHYCARDIA (ICD-785.0)  Problem # 2:  GERD (ICD-530.81)  Her updated medication list for this problem includes:    Omeprazole 40 Mg Cpdr (Omeprazole) ..... Once daily  Problem # 3:  OSTEOARTHRITIS (ICD-715.90)  Her updated medication list for this problem includes:    Aspirin 81 Mg Tbec (Aspirin) ..... One by mouth every day  Problem # 4:  HYPOTHYROIDISM (ICD-244.9)  Her updated medication list for this problem includes:    Synthroid 100 Mcg Tabs (Levothyroxine sodium) .Marland Kitchen... 1 by mouth once daily  Problem # 5:  HYPERTENSION (ICD-401.9)  Her updated medication list for this problem includes:    Triamterene-hctz 37.5-25 Mg Tabs (Triamterene-hctz) .Marland Kitchen... Take 1 tablet by mouth once a day    Losartan Potassium 50 Mg Tabs (Losartan potassium) ..... Once daily    Metoprolol Succinate 50 Mg Xr24h-tab (Metoprolol succinate) ..... Once daily  Orders: UA Dipstick w/o Micro (automated)  (81003) Venipuncture (83151) TLB-Lipid Panel (80061-LIPID) TLB-BMP (Basic Metabolic Panel-BMET) (80048-METABOL) TLB-CBC Platelet - w/Differential  (85025-CBCD) TLB-Hepatic/Liver Function Pnl (80076-HEPATIC) TLB-TSH (Thyroid Stimulating Hormone) (84443-TSH) Specimen Handling (76160)  Problem # 6:  HYPERLIPIDEMIA (ICD-272.4)  Her updated medication list for this problem includes:    Lipitor 10 Mg Tabs (Atorvastatin calcium) .Marland Kitchen... Take 1 tablet by mouth once a day  Problem # 7:  WHEEZING (ICD-786.07)  Orders: T-2 View CXR (71020TC)  Complete Medication List: 1)  Multivitamins Tabs (Multiple vitamin) .Marland Kitchen.. 1 by mouth once daily 2)  Actonel 35 Mg Tabs (Risedronate sodium) .... Once weekly 3)  Triamterene-hctz 37.5-25 Mg Tabs (Triamterene-hctz) .... Take 1 tablet by mouth once a day 4)  Lipitor 10 Mg Tabs (Atorvastatin calcium) .... Take 1 tablet by mouth once a day 5)  Synthroid 100 Mcg Tabs (Levothyroxine sodium) .Marland Kitchen.. 1 by mouth once daily 6)  Aspirin 81 Mg Tbec (Aspirin) .... One by mouth every day 7)  Omeprazole 40 Mg Cpdr (Omeprazole) .... Once daily 8)  Losartan Potassium 50 Mg Tabs (Losartan potassium) .... Once daily 9)  Metoprolol Succinate 50 Mg Xr24h-tab (Metoprolol succinate) .... Once daily  Other Orders: Gastroenterology Referral (GI)  Patient Instructions: 1)  Get fasting labs today. Get a CXR today. Set up another colonoscopy.    Eye Exam  last eye exam 2011 pt cant remember name of dr or location   Appended Document: Orders Update     Clinical Lists Changes  Observations: Added new observation of COMMENTS: Wynona Canes, CMA  July 14, 2010 12:26 PM  (07/14/2010 12:25) Added new observation of PH URINE: 7.5  (07/14/2010 12:25) Added new observation of SPEC GR URIN: 1.020  (07/14/2010 12:25) Added new observation of APPEARANCE U: Clear  (07/14/2010 12:25) Added new observation of UA COLOR: yellow  (07/14/2010 12:25) Added new observation of WBC DIPSTK  U: 2+  (07/14/2010 12:25) Added new observation of NITRITE URN: negative  (07/14/2010 12:25) Added new observation of UROBILINOGEN: 0.2   (07/14/2010 12:25) Added new observation of PROTEIN, URN: negative  (07/14/2010 12:25) Added new observation of BLOOD UR DIP: negative  (07/14/2010 12:25) Added new observation of KETONES URN: negative  (07/14/2010 12:25) Added new observation of BILIRUBIN UR: negative  (07/14/2010 12:25) Added new observation of GLUCOSE, URN: negative  (07/14/2010 12:25)      Laboratory Results   Urine Tests  Date/Time Recieved: July 14, 2010 12:26 PM  Date/Time Reported: July 14, 2010 12:26 PM   Routine Urinalysis   Color: yellow Appearance: Clear Glucose: negative   (Normal Range: Negative) Bilirubin: negative   (Normal Range: Negative) Ketone: negative   (Normal Range: Negative) Spec. Gravity: 1.020   (Normal Range: 1.003-1.035) Blood: negative   (Normal Range: Negative) pH: 7.5   (Normal Range: 5.0-8.0) Protein: negative   (Normal Range: Negative) Urobilinogen: 0.2   (Normal Range: 0-1) Nitrite: negative   (Normal Range: Negative) Leukocyte Esterace: 2+   (Normal Range: Negative)    Comments: Wynona Canes, CMA  July 14, 2010 12:26 PM

## 2010-12-05 NOTE — Progress Notes (Signed)
Summary: episodes of tachycardia last night. ? pulse rate at that time.  Phone Note Call from Patient   Caller: Patient Call For: Nelwyn Salisbury MD Summary of Call: Pt started Losartan, and has had tachycardia and sweating last night .....Marland KitchenMarland KitchenLasted 30 min, and BP is 170 / 102 right now.  What should she do? [phone] Initial call taken by: Lynann Beaver CMA,  June 13, 2010 10:11 AM  Follow-up for Phone Call        she should have taken an Amlodipine 5 mg tablet this am. If so, tell her to take a second one now. Start taking two tabs every am, and see me in one week. If she feels any worse, let us know Follow-up by: Nelwyn Salisbury MD,  June 13, 2010 10:26 AM  Additional Follow-up for Phone Call Additional follow up Details #1::        Pt notified. Additional Follow-up by: Lynann Beaver CMA,  June 13, 2010 11:09 AM

## 2010-12-05 NOTE — Miscellaneous (Signed)
Summary: Lec previsit  Clinical Lists Changes  Medications: Added new medication of MOVIPREP 100 GM  SOLR (PEG-KCL-NACL-NASULF-NA ASC-C) As per prep instructions. - Signed Rx of MOVIPREP 100 GM  SOLR (PEG-KCL-NACL-NASULF-NA ASC-C) As per prep instructions.;  #1 x 0;  Signed;  Entered by: Harlow Mares CMA (AAMA);  Authorized by: Mardella Layman MD [facility];  Method used: [facility] [id number]*, [address], [place], [place]  [address], Ph: [phone] or [phone], Fax: [phone] Observations: Added new observation of ALLERGY REV: Done (09/19/2010 13:10)    Prescriptions: MOVIPREP 100 GM  SOLR (PEG-KCL-NACL-NASULF-NA ASC-C) As per prep instructions.  #1 x 0   Entered by:   Harlow Mares CMA (AAMA)   Authorized by:   Mardella Layman MD [facility]   Signed by:   Harlow Mares CMA (AAMA) on 09/19/2010   Method used:   [facility]ically to        [facility] [phone]* (retail)       [address]       [place], [place]  [address]       Ph: [phone] or [phone]       Fax: [phone]   RxID:   [id number]  Pt came into office today for the previsit prior to the colonscopy on the 11/123/11. She has a hiatal hernia and has a lot gas and pain and does not want to take the Moviprep.Can we change the prep ?   Please advise She would need an office visit otherwise. There is no data available concerning her medical problems, meds, renal function et Karie Soda. Per Dr Jarold Motto   Harlow Mares CMA Duncan Dull)  September 19, 2010 3:05 PM    Patient declines to come for an office visit states that she "does not have time for that", She says that her surgeon requested that she have her colonoscopy since it was due in 2010. She has heard alot about the movi prep and she would like a different prep but once again I have advised her if she wishes to change preps per Dr. Jarold Motto she will need an office visit. She declines and says she will just do the movi prep. I have sent the pharm. and she  will come for the procedure.

## 2010-12-05 NOTE — Miscellaneous (Signed)
Summary: analpram prescription  Clinical Lists Changes  Medications: Added new medication of ANALPRAM-HC 1-2.5 %  CREA (HYDROCORTISONE ACE-PRAMOXINE) apply to rectal area as needed for hemorrhoids - Signed Rx of ANALPRAM-HC 1-2.5 %  CREA (HYDROCORTISONE ACE-PRAMOXINE) apply to rectal area as needed for hemorrhoids;  #1 tube x 1;  Signed;  Entered by: Doristine Church RN II;  Authorized by: Mardella Layman MD [facility];  Method used: [facility] to [facility] [id number]*, [address] [place], [place]  [address], Ph: [phone] or [phone], Fax: [phone]    Prescriptions: ANALPRAM-HC 1-2.5 %  CREA (HYDROCORTISONE ACE-PRAMOXINE) apply to rectal area as needed for hemorrhoids  #1 tube x 1   Entered by:   Doristine Church RN II   Authorized by:   Mardella Layman MD [facility]   Signed by:   Doristine Church RN II on 09/27/2010   Method used:   Electronically to        [facility] [phone]* (retail)       [address]       [place], [place]  [address]       Ph: [phone] or [phone]       Fax: [phone]   RxID:   [id number]

## 2010-12-05 NOTE — Progress Notes (Signed)
Summary: probs with meds.  Phone Note Call from Patient   Caller: Patient Call For: Nelwyn Salisbury MD Summary of Call: Pt is calling to let Dr. Clent Ridges know that she is having fast, irregular hearbeat on amlodopine. [phone] Initial call taken by: Lynann Beaver CMA,  June 23, 2010 9:04 AM  Follow-up for Phone Call        have her come in to see me this afternoon. We need to change her meds around Follow-up by: Nelwyn Salisbury MD,  June 23, 2010 11:36 AM  Additional Follow-up for Phone Call Additional follow up Details #1::        scheduled appt. Additional Follow-up by: Lynann Beaver CMA,  June 23, 2010 12:48 PM

## 2010-12-05 NOTE — Letter (Signed)
Summary: Pre Visit Letter Revised  [facility]  [address] [place], [place] [address]   Phone: [phone]  Fax: [phone]        09/12/2010 MRN: [id number] Christina Jacobs [address] [place], [place]  [address]             Procedure Date:  09/27/2010   Welcome to the Gastroenterology Division at [facility].    You are scheduled to see a nurse for your pre-procedure visit on 09/19/2010 at 1:30PM on the 3rd floor at [facility], [address]  We ask that you try to arrive at our office 15 minutes prior to your appointment time to allow for check-in.  Please take a minute to review the attached form.  If you answer "Yes" to one or more of the questions on the first page, we ask that you call the person listed at your earliest opportunity.  If you answer "No" to all of the questions, please complete the rest of the form and bring it to your appointment.    Your nurse visit will consist of discussing your medical and surgical history, your immediate family medical history, and your medications.   If you are unable to list all of your medications on the form, please bring the medication bottles to your appointment and we will list them.  We will need to be aware of both prescribed and over the counter drugs.  We will need to know exact dosage information as well.    Please be prepared to read and sign documents such as consent forms, a financial agreement, and acknowledgement forms.  If necessary, and with your consent, a friend or relative is welcome to sit-in on the nurse visit with you.  Please bring your insurance card so that we may make a copy of it.  If your insurance requires a referral to see a specialist, please bring your referral form from your primary care physician.  No co-pay is required for this nurse visit.     If you cannot keep your appointment, please call [phone] to cancel or reschedule prior to your appointment date.  This  allows [place] the opportunity to schedule an appointment for another patient in need of care.    Thank you for choosing [facility] for your medical needs.  We appreciate the opportunity to care for you.  Please visit [place] at our website  to learn more about our practice.  Sincerely, The Gastroenterology Division

## 2010-12-05 NOTE — Assessment & Plan Note (Signed)
Summary: 1 week follow up/per Dr. Karalee Height   Vital Signs:  Patient profile:   70 year old female Weight:      189 pounds BMI:     30.62 Pulse rate:   108 / minute Pulse rhythm:   regular BP sitting:   170 / 106  (left arm) Cuff size:   regular  Vitals Entered By: Raechel Ache, RN (June 20, 2010 9:51 AM) CC: F/u meds; feels shakey.   History of Present Illness: Here with questions about her BP. When I saw her several weeks ago, we discussed some wheezing and dry cough sha had had for awhile. We felt this could be from her Benazepril, so we stopped it. Sure enough these symptoms have almost completely disappeared now. We instructed her to double up on her daily dose of Amlodipine, but instead she has doubled up on Losartan (so she has been on Amlodipine 5mg  a day and Losartan 100mg  a day). Her BP has been up and down since then, and her pulse rate is often in the 90-100 range. No chest pain or SOB.   Allergies: 1)  ! Darvon 2)  ! Demerol (Meperidine Hcl)  Past History:  Past Medical History: Reviewed history from 02/16/2009 and no changes required. Hyperlipidemia Hypertension Hypothyroidism Osteopenia, last DEXA 12-20-05 normal cardiac stress test 02-24-07 Osteoarthritis, sees Dr. Judson Roch  Review of Systems  The patient denies anorexia, fever, weight loss, weight gain, vision loss, decreased hearing, hoarseness, chest pain, syncope, dyspnea on exertion, peripheral edema, prolonged cough, headaches, hemoptysis, abdominal pain, melena, hematochezia, severe indigestion/heartburn, hematuria, incontinence, genital sores, muscle weakness, suspicious skin lesions, transient blindness, difficulty walking, depression, unusual weight change, abnormal bleeding, enlarged lymph nodes, angioedema, breast masses, and testicular masses.    Physical Exam  General:  Well-developed,well-nourished,in no acute distress; alert,appropriate and cooperative throughout examination Neck:  No  deformities, masses, or tenderness noted. Lungs:  Normal respiratory effort, chest expands symmetrically. Lungs are clear to auscultation, no crackles or wheezes. Heart:  Normal rate and regular rhythm. S1 and S2 normal without gallop, murmur, click, rub or other extra sounds.   Impression & Recommendations:  Problem # 1:  HYPERTENSION (ICD-401.9)  The following medications were removed from the medication list:    Amlodipine Besylate 5 Mg Tabs (Amlodipine besylate) ..... Once daily Her updated medication list for this problem includes:    Triamterene-hctz 37.5-25 Mg Tabs (Triamterene-hctz) .Marland Kitchen... Take 1 tablet by mouth once a day    Losartan Potassium 50 Mg Tabs (Losartan potassium) ..... Once daily    Amlodipine Besylate 10 Mg Tabs (Amlodipine besylate) ..... Once daily  Problem # 2:  COUGH (ICD-786.2)  Complete Medication List: 1)  Multivitamins Tabs (Multiple vitamin) .Marland Kitchen.. 1 by mouth once daily 2)  Actonel 35 Mg Tabs (Risedronate sodium) .... Once weekly 3)  Triamterene-hctz 37.5-25 Mg Tabs (Triamterene-hctz) .... Take 1 tablet by mouth once a day 4)  Lipitor 10 Mg Tabs (Atorvastatin calcium) .... Take 1 tablet by mouth once a day 5)  Synthroid 100 Mcg Tabs (Levothyroxine sodium) .Marland Kitchen.. 1 by mouth once daily 6)  Aspirin 81 Mg Tbec (Aspirin) .... One by mouth every day 7)  Omeprazole 40 Mg Cpdr (Omeprazole) .... Once daily 8)  Losartan Potassium 50 Mg Tabs (Losartan potassium) .... Once daily 9)  Amlodipine Besylate 10 Mg Tabs (Amlodipine besylate) .... Once daily  Patient Instructions: 1)  Decrease Losartan to 50 mg a day and increase Amlodipine to 10 mg a day.  2)  Please schedule  a follow-up appointment in 2 weeks.  Prescriptions: AMLODIPINE BESYLATE 10 MG TABS (AMLODIPINE BESYLATE) once daily  #30 x 11   Entered and Authorized by:   Nelwyn Salisbury MD   Signed by:   Nelwyn Salisbury MD on 06/20/2010   Method used:   [facility] [phone]* (retail)        [address]       [place], [place]  [address]       Ph: [phone] or [phone]       Fax: [phone]   RxID:   [id number]

## 2010-12-05 NOTE — Progress Notes (Signed)
Summary: forgot Rxs  Phone Note Call from Patient Call back at [facility] Phone [phone]   Summary of Call: Forgot to get Rxs that I send into mailorder.  Triamterene/HCTZ 25mg  - 37.5 one a day, Levothyroxine 0.1 mg one daily, Losartn Pot 50mg  one daily, Metoprolol 50mg  one daily, Lipitor 10mg  one daily, Actonel 35mg  one weekly.  3 month supply and refills for year.  Levaquin 500mg  one daily, usually 5-7 days as Dr. Clent Ridges prescribes to have on hand for UTI.   Will send in herself and pick up antibiotic Levaquin with the written Rxs she needs ASAP.  Please let her know when she can pick them up.    Initial call taken by: Rudy Jew, RN,  July 14, 2010 11:37 AM  Follow-up for Phone Call        done Follow-up by: Nelwyn Salisbury MD,  July 14, 2010 1:27 PM  Additional Follow-up for Phone Call Additional follow up Details #1::        done  pt notified Additional Follow-up by: Pura Spice, RN,  July 14, 2010 2:32 PM    New/Updated Medications: ACTONEL 35 MG  TABS (RISEDRONATE SODIUM) once weekly LIPITOR 10 MG  TABS (ATORVASTATIN CALCIUM) Take 1 tablet by mouth once a day SYNTHROID 100 MCG  TABS (LEVOTHYROXINE SODIUM) 1 by mouth once daily [BMN] OMEPRAZOLE 40 MG CPDR (OMEPRAZOLE) once daily LOSARTAN POTASSIUM 50 MG TABS (LOSARTAN POTASSIUM) once daily LEVAQUIN 500 MG TABS (LEVOFLOXACIN) once daily Prescriptions: LEVAQUIN 500 MG TABS (LEVOFLOXACIN) once daily  #30 x 0   Entered and Authorized by:   Nelwyn Salisbury MD   Signed by:   Nelwyn Salisbury MD on 07/14/2010   Method used:   Print then Give to Patient   RxID:   [id number] METOPROLOL SUCCINATE 50 MG XR24H-TAB (METOPROLOL SUCCINATE) once daily  #90 x 3   Entered and Authorized by:   Nelwyn Salisbury MD   Signed by:   Nelwyn Salisbury MD on 07/14/2010   Method used:   Print then Give to Patient   RxID:   [id number] UXLKGMWN POTASSIUM 50 MG TABS (LOSARTAN POTASSIUM) once daily  #90 x 3   Entered and Authorized  by:   Nelwyn Salisbury MD   Signed by:   Nelwyn Salisbury MD on 07/14/2010   Method used:   Print then Give to Patient   RxID:   [id number] OMEPRAZOLE 40 MG CPDR (OMEPRAZOLE) once daily  #90 x 3   Entered and Authorized by:   Nelwyn Salisbury MD   Signed by:   Nelwyn Salisbury MD on 07/14/2010   Method used:   Print then Give to Patient   RxID:   [id number] SYNTHROID 100 MCG  TABS (LEVOTHYROXINE SODIUM) 1 by mouth once daily Brand medically necessary #90 x 3   Entered and Authorized by:   Nelwyn Salisbury MD   Signed by:   Nelwyn Salisbury MD on 07/14/2010   Method used:   Print then Give to Patient   RxID:   [id number] LIPITOR 10 MG  TABS (ATORVASTATIN CALCIUM) Take 1 tablet by mouth once a day  #90 x 3   Entered and Authorized by:   Nelwyn Salisbury MD   Signed by:   Nelwyn Salisbury MD on 07/14/2010   Method used:   Print then Give to Patient   RxID:   [id number] TRIAMTERENE-HCTZ 37.5-25 MG  TABS (TRIAMTERENE-HCTZ) Take 1  tablet by mouth once a day  #90 x 3   Entered and Authorized by:   Nelwyn Salisbury MD   Signed by:   Nelwyn Salisbury MD on 07/14/2010   Method used:   Print then Give to Patient   RxID:   [id number] ACTONEL 35 MG  TABS (RISEDRONATE SODIUM) once weekly  #12 x 3   Entered and Authorized by:   Nelwyn Salisbury MD   Signed by:   Nelwyn Salisbury MD on 07/14/2010   Method used:   Print then Give to Patient   RxID:   [id number]

## 2010-12-05 NOTE — Letter (Signed)
Summary: Patient Notice- Colon Biospy Results  [facility]  [address] [place], [place] [address]   Phone: [phone]  Fax: [phone]        October 03, 2010 MRN: [id number]    KENIESHA ADDERLY [address] [place], [place]  [address]    Dear Ms. Merlino,  I am pleased to inform you that the biopsies taken during your recent colonoscopy did not show any evidence of cancer upon pathologic examination.  Additional information/recommendations:  _x_No further action is needed at this time.  Please follow-up with      your primary care physician for your other healthcare needs.The sigmoid polyp was an inflammatory polyp and was not an adenoma. A copy of this pathology report will be sent to Dr. Clent Ridges and Dr. Daphine Deutscher.  __Please call [phone] to schedule a return visit to review      your condition.  __Continue with the treatment plan as outlined on the day of your      exam.  __You should have a repeat colonoscopy examination for this problem           in 5 years.  Please call us if you are having persistent problems or have questions about your condition that have not been fully answered at this time.  Sincerely,  Mardella Layman MD [facility]   This letter has been electronically signed by your physician.   Appended Document: Patient Notice- Colon Biospy Results Letter mailed

## 2010-12-07 NOTE — Assessment & Plan Note (Signed)
Summary: SHINGLE VACCINE/NJR   Nurse Visit   Allergies: 1)  ! Darvon 2)  ! Demerol (Meperidine Hcl) 3)  ! Codeine  Immunizations Administered:  Zostavax # 1:    Vaccine Type: Zostavax    Site: left deltoid    Mfr: Merck    Dose: 0.5 ml    Route: [place]    Given by: Pura Spice, RN    Exp. Date: 09/10/2011    Lot #: [id number]    VIS given: 08/17/05 given November 14, 2010.  Orders Added: 1)  Zoster (Shingles) Vaccine Live [90736] 2)  Admin 1st Vaccine [phone]

## 2010-12-13 NOTE — Letter (Signed)
Summary: [facility]   Imported By: Maryln Gottron 12/05/2010 11:10:55  _____________________________________________________________________  External Attachment:    Type:   Image     Comment:   External Document

## 2011-01-15 LAB — CBC
HCT: 36.5 % (ref 36.0–46.0)
Hemoglobin: 11.8 g/dL — ABNORMAL LOW (ref 12.0–15.0)
MCH: 29.5 pg (ref 26.0–34.0)
MCHC: 32.3 g/dL (ref 30.0–36.0)
MCV: 91.3 fL (ref 78.0–100.0)
Platelets: 152 K/uL (ref 150–400)
RBC: 4 MIL/uL (ref 3.87–5.11)
RDW: 13.7 % (ref 11.5–15.5)
WBC: 9.3 K/uL (ref 4.0–10.5)

## 2011-01-16 LAB — BASIC METABOLIC PANEL
BUN: 19 mg/dL (ref 6–23)
Chloride: 104 mEq/L (ref 96–112)
Glucose, Bld: 122 mg/dL — ABNORMAL HIGH (ref 70–99)
Potassium: 4.6 mEq/L (ref 3.5–5.1)
Sodium: 137 mEq/L (ref 135–145)

## 2011-01-16 LAB — CBC
HCT: 42.8 % (ref 36.0–46.0)
Hemoglobin: 14.4 g/dL (ref 12.0–15.0)
MCV: 88.8 fL (ref 78.0–100.0)
RDW: 13.6 % (ref 11.5–15.5)
WBC: 6.9 10*3/uL (ref 4.0–10.5)

## 2011-01-16 LAB — SURGICAL PCR SCREEN: MRSA, PCR: NEGATIVE

## 2011-03-23 NOTE — Assessment & Plan Note (Signed)
[facility] OFFICE NOTE   NAME:STEINMETZCarleen, Rhue                    MRN:          [id number]  DATE:02/14/2007                            DOB:          December 13, 1940    This is a 70 year old woman here for a complete physical examination.  She does have a couple of problems to discuss.  Primarily, that of  periodic palpitations in the chest.  She had mentioned this to me a few  years ago, and in fact she had a normal evaluation at that time,  including an echocardiogram in February of 2004 that showed no evidence  of mitral valve prolapse specifically, and was otherwise normal.  She  has spells of these palpitations from time to time, but they do not  usually bother her.  However, she stopped aggressively exercising about  3 weeks ago because they have become a little more frequent than usual.  She has been walking lightly since then, but not the hard, aggressive  walks that she used to take.  She denies any shortness of breath,  nausea, sweats, or chest pain.  As noted previously in the chart, she  did have some hyperplastic polyps removed in April of 2005.  She is not  sure when a followup colonoscopy was recommended for her, however, and I  do not have a copy of the colonoscopy report in my chart either.  Otherwise, she continues with regular mammograms.  For other details of  her past medical history, family history, social history, habits, Melvern Banker, refer to our last physical note dated January 31, 2006.   ALLERGIES:  DARVON and DEMEROL.   CURRENT MEDICATIONS:  1. Multivitamins daily.  2. Calcium and vitamin D daily.  3. Aspirin 81 mg daily.  4. Actonel 35 mg per week.  5. Triamterine hydrochlorothiazide 37.5/25 once a day.  6. Altace 10 mg per day.  7. Levothyroxine 0.1 mg per day.  8. Lipitor 10 mg per day.   OBJECTIVE:  Height 5 feet 6 inches.  Weight 175.  BP 142/98, pulse 72  and regular.  GENERAL:   She appears to be doing well.  SKIN:  Clear.  SKIN:  Clear.  EYES:  Clear sclerae.  OROPHARYNX:  Clear.  NECK:  Supple without lymphadenopathy or masses.  LUNGS:  Clear.  CARDIAC:  Rate and rhythm regular without gallops, murmurs, or rubs.  Distal pulses are full.  EKG shows sinus rhythm.  She does have some  mild depressions of her lateral ST segments, however, especially when  compared to a year ago.  BREASTS AND AXILLAE:  Clear.  ABDOMEN :  Soft, normal bowel sounds.  Nontender.  No masses.  RECTAL:  No mass or tenderness.  Stool is Hemoccult positive.  EXTREMITIES:  No cyanosis, clubbing, or edema.  NEUROLOGIC:  Grossly intact.   ASSESSMENT AND PLAN:  Problem #1:  Complete physical.  We will get the  usual laboratories today.  Problem #2:  Hypertension.  We will stop Altace and switch to Lotrel  5/20 to take once daily.  She will continue  to follow it at home and let  me know how she is doing in a few weeks.  Problem #3:  Hypothyroidism.  Will check a TSH level today, but we will  switch to name-brand only Synthroid to achieve more consistent dosing.  Problem #4:  Hemoccult positive stools today.  I will get her actual  report from 2005 to see if we need to pursue this any time soon.  She  certainly has a history of internal hemorrhoids, and I suspect the  Hemoccult result today is resulting from them.  Problem #5:  Hyperlipidemia.  Will check a fasting lipid panel today.  Problem #6:  Abnormal EKG, as well as some recent complaints of  palpitations.  Will set her up for a treadmill Myoview stress test soon.  Problem #7:  Osteoporosis.  Stable.     Tera Mater. Clent Ridges, MD  Electronically Signed    SAF/MedQ  DD: 02/14/2007  DT: 02/14/2007  Job #: [id number]

## 2011-03-23 NOTE — Assessment & Plan Note (Signed)
[facility]                         GASTROENTEROLOGY OFFICE NOTE   NAME:STEINMETZPorschia, Willbanks                    MRN:          [id number]  DATE:02/25/2007                            DOB:          1940-11-08    OFFICE CONSULTATION:   PROBLEM:  Hemoccult-positive stool.   HISTORY:  Catori is a pleasant 70 year old white female known to Dr.  Jarold Motto from prior colonoscopy, which was done in April 2005.  This  was done for screening.  She was found to have one 4 mm rectal polyp,  sessile, which was hot-biopsied and removed.  There is no pathology  available for that polyp.  She was also noted to have left colon  diverticulosis.  She also has history of hypertension, hyperlipidemia, a  thyroid nodule which was previously removed, remote hemorrhoidectomy,  partial hysterectomy in 1984, and a lumpectomy bilaterally.   The patient had undergone recent physical with Dr. Clent Ridges and as part of  that had a rectal exam, which showed Hemoccult-positive stool and was  subsequently referred.  The patient herself has not noticed any melena  or hematochezia.  She denies any problems with abdominal pain.  She says  if she overeats, she tends to get bloated and has indigestion but she  can control this with watching her diet.  Her bowel movements have been  regular.  She uses Metamucil on a daily basis and says the only time she  has difficulty is a tendency to be constipated when she is traveling.  She does take one baby aspirin per day, no other blood thinners, and no  regular NSAIDs.   CURRENT MEDICATIONS:  1. Vitamins daily.  2. Caltrate With D daily.  3. Aspirin 81 mg daily.  4. Actonel 35 mg weekly.  5. Triamterine/HCT 37.5/25 mg daily.  6. Altace 10 mg daily.  7. Levothyroxine 0.1 mg daily.  8. Lipitor 10 mg daily.   ALLERGIES:  DARVON and DEMEROL.   Past history as outlined above.  She also had tonsillectomy as a child.   SOCIAL HISTORY:  The patient is  married and lives with her husband.  She  has three children.  She is retired.  Previously had worked in [employer].  She is a nonsmoker, nondrinker.   FAMILY HISTORY:  Positive for colon cancer in her father in his 34s and  also relates that she believes her paternal grandfather had colon cancer  and had a colostomy.   REVIEW OF SYSTEMS:  GI:  As outlined above.  HEENT:  Negative with the  exception of eyeglasses.  GYN:  The patient has a history of fibrocystic  breast disease.  All other review of systems reviewed and negative.   PHYSICAL EXAMINATION:  GENERAL:  A well-developed white female in no  acute distress.  VITAL SIGNS:  Height is 5 feet 6 inches.  Weight is 176.2.  Blood  pressure 142/86, pulse is 88.  HEENT:  Atraumatic, normocephalic.  EOMI.  PERRLA.  Sclerae anicteric.  CARDIOVASCULAR:  Regular rate and rhythm with S1 and S2, occasional  ectopic.  No murmur, rub or gallop.  PULMONARY:  Clear to A&P.  ABDOMEN:  Soft.  Bowel sounds are active.  She is nontender.  There is  no palpable mass or hepatosplenomegaly.  RECTAL:  Exam not done at this time.   IMPRESSION:  81. A 70 year old white female with Hemoccult-positive stool and a      history of colon polyps.  Rule out occult colon lesion.  2. Positive family history of colon cancer in patient's father and      probably paternal grandfather.  3. Diverticulosis.   PLAN:  Schedule colonoscopy at her convenience.  If this is negative, I  will have her do Hemoccults and if those are positive, proceed with EGD.      Mike Gip, PA-C  Electronically Signed      Vania Rea. Jarold Motto, MD, Caleen Essex, FAGA  Electronically Signed   AE/MedQ  DD: 02/25/2007  DT: 02/25/2007  Job #: [id number]   cc:   Jeannett Senior A. Clent Ridges, MD

## 2011-05-29 ENCOUNTER — Other Ambulatory Visit: Payer: Self-pay

## 2011-05-29 MED ORDER — METOPROLOL SUCCINATE ER 50 MG PO TB24: 50.0000 mg | ORAL_TABLET | Freq: Every day | ORAL | Status: DC

## 2011-05-29 MED ORDER — LEVOTHYROXINE SODIUM 100 MCG PO TABS: 100.0000 ug | ORAL_TABLET | Freq: Every day | ORAL | Status: DC

## 2011-05-29 MED ORDER — TRIAMTERENE-HCTZ 37.5-25 MG PO CAPS: 1.0000 | ORAL_CAPSULE | Freq: Every day | ORAL | Status: DC

## 2011-05-29 MED ORDER — ATORVASTATIN CALCIUM 10 MG PO TABS: 10.0000 mg | ORAL_TABLET | Freq: Every day | ORAL | Status: DC

## 2011-05-29 MED ORDER — LOSARTAN POTASSIUM 50 MG PO TABS: 50.0000 mg | ORAL_TABLET | Freq: Every day | ORAL | Status: DC

## 2011-05-29 NOTE — Telephone Encounter (Signed)
07/31/2010 

## 2011-07-16 ENCOUNTER — Ambulatory Visit (INDEPENDENT_AMBULATORY_CARE_PROVIDER_SITE_OTHER): Payer: Medicare Other | Admitting: Family Medicine

## 2011-07-16 ENCOUNTER — Other Ambulatory Visit: Payer: Medicare Other

## 2011-07-16 ENCOUNTER — Encounter: Payer: Self-pay | Admitting: Family Medicine

## 2011-07-16 ENCOUNTER — Telehealth: Payer: Self-pay | Admitting: Family Medicine

## 2011-07-16 VITALS — BP 126/80 | HR 87 | Temp 98.0°F | Ht 66.5 in | Wt 184.0 lb

## 2011-07-16 DIAGNOSIS — R0902 Hypoxemia: Secondary | ICD-10-CM

## 2011-07-16 DIAGNOSIS — I10 Essential (primary) hypertension: Secondary | ICD-10-CM

## 2011-07-16 DIAGNOSIS — M899 Disorder of bone, unspecified: Secondary | ICD-10-CM

## 2011-07-16 DIAGNOSIS — M8580 Other specified disorders of bone density and structure, unspecified site: Secondary | ICD-10-CM

## 2011-07-16 DIAGNOSIS — E785 Hyperlipidemia, unspecified: Secondary | ICD-10-CM

## 2011-07-16 DIAGNOSIS — K440 Diaphragmatic hernia with obstruction, without gangrene: Secondary | ICD-10-CM

## 2011-07-16 DIAGNOSIS — Z136 Encounter for screening for cardiovascular disorders: Secondary | ICD-10-CM

## 2011-07-16 LAB — POCT URINALYSIS DIPSTICK
Ketones, UA: NEGATIVE
Spec Grav, UA: 1.02

## 2011-07-16 LAB — CBC WITH DIFFERENTIAL/PLATELET
Eosinophils Relative: 0.6 % (ref 0.0–5.0)
HCT: 44.4 % (ref 36.0–46.0)
Hemoglobin: 14.6 g/dL (ref 12.0–15.0)
Lymphs Abs: 3.1 10*3/uL (ref 0.7–4.0)
Monocytes Relative: 8.2 % (ref 3.0–12.0)
Neutro Abs: 5.9 10*3/uL (ref 1.4–7.7)
RDW: 13.8 % (ref 11.5–14.6)

## 2011-07-16 MED ORDER — OMEPRAZOLE 40 MG PO CPDR: 40.0000 mg | DELAYED_RELEASE_CAPSULE | Freq: Every day | ORAL | Status: DC

## 2011-07-16 MED ORDER — LEVOTHYROXINE SODIUM 100 MCG PO TABS: 100.0000 ug | ORAL_TABLET | Freq: Every day | ORAL | Status: DC

## 2011-07-16 MED ORDER — VALACYCLOVIR HCL 1 G PO TABS: 1000.0000 mg | ORAL_TABLET | Freq: Two times a day (BID) | ORAL | Status: DC

## 2011-07-16 MED ORDER — TRAMADOL HCL 50 MG PO TABS: 50.0000 mg | ORAL_TABLET | Freq: Four times a day (QID) | ORAL | Status: DC | PRN

## 2011-07-16 MED ORDER — TRIAMTERENE-HCTZ 37.5-25 MG PO CAPS: 1.0000 | ORAL_CAPSULE | Freq: Every day | ORAL | Status: DC

## 2011-07-16 MED ORDER — METOPROLOL SUCCINATE ER 50 MG PO TB24: 50.0000 mg | ORAL_TABLET | Freq: Every day | ORAL | Status: DC

## 2011-07-16 MED ORDER — LOSARTAN POTASSIUM 50 MG PO TABS: 50.0000 mg | ORAL_TABLET | Freq: Every day | ORAL | Status: DC

## 2011-07-16 MED ORDER — ATORVASTATIN CALCIUM 10 MG PO TABS: 10.0000 mg | ORAL_TABLET | Freq: Every day | ORAL | Status: DC

## 2011-07-16 NOTE — Telephone Encounter (Signed)
Pt had last bone density test done at [facility]. Pt would like a referral.

## 2011-07-16 NOTE — Progress Notes (Signed)
  Subjective:    Patient ID: Christina Jacobs, female    DOB: May 17, 1941, 70 y.o.   MRN: [id number]  HPI 70 yr old female for a cpx. She feels well in general except for some mild chronic fatigue. No chest pains. She gets mildly SOB on exercise. She had a laparoscopic surgery last December for a large hiatal hernia and some volvulus. Her entire stomach is above the diaphragm and her transverse colon was also in the chest cavity. He was able to reduce the volvulus, and he tacked down the colon against the omentum to keep this in the abdominal cavity. He could reduce the hernia however because it is adherent to the mediastinum with adhesions. She has no GERD symptoims fortunately. Her BMs are regular. She was also noted to have low oxygen levels around the time of her surgery.    Review of Systems  Constitutional: Negative.   HENT: Negative.   Eyes: Negative.   Respiratory: Negative.   Cardiovascular: Negative.   Gastrointestinal: Negative.   Genitourinary: Negative for dysuria, urgency, frequency, hematuria, flank pain, decreased urine volume, enuresis, difficulty urinating, pelvic pain and dyspareunia.  Musculoskeletal: Negative.   Skin: Negative.   Neurological: Negative.   Hematological: Negative.   Psychiatric/Behavioral: Negative.        Objective:   Physical Exam  Constitutional: She is oriented to person, place, and time. She appears well-developed and well-nourished. No distress.  HENT:  Head: Normocephalic and atraumatic.  Right Ear: External ear normal.  Left Ear: External ear normal.  Nose: Nose normal.  Mouth/Throat: Oropharynx is clear and moist. No oropharyngeal exudate.  Eyes: Conjunctivae and EOM are normal. Pupils are equal, round, and reactive to light. No scleral icterus.  Neck: Normal range of motion. Neck supple. No JVD present. No thyromegaly present.  Cardiovascular: Normal rate, regular rhythm, normal heart sounds and intact distal pulses.  Exam reveals no  gallop and no friction rub.   No murmur heard.      EKG normal   Pulmonary/Chest: Effort normal and breath sounds normal. No respiratory distress. She has no wheezes. She has no rales. She exhibits no tenderness.  Abdominal: Soft. Bowel sounds are normal. She exhibits no distension and no mass. There is no tenderness. There is no rebound and no guarding.  Musculoskeletal: Normal range of motion. She exhibits no edema and no tenderness.  Lymphadenopathy:    She has no cervical adenopathy.  Neurological: She is alert and oriented to person, place, and time. She has normal reflexes. No cranial nerve deficit. She exhibits normal muscle tone. Coordination normal.  Skin: Skin is warm and dry. No rash noted. No erythema.  Psychiatric: She has a normal mood and affect. Her behavior is normal. Judgment and thought content normal.          Assessment & Plan:  Get fasting labs. Refer to Pulmonary to address the hypoxia. Set up another DEXA,

## 2011-07-17 LAB — HEPATIC FUNCTION PANEL
ALT: 16 U/L (ref 0–35)
AST: 21 U/L (ref 0–37)
Alkaline Phosphatase: 47 U/L (ref 39–117)
Bilirubin, Direct: 0.1 mg/dL (ref 0.0–0.3)
Total Bilirubin: 1 mg/dL (ref 0.3–1.2)

## 2011-07-17 LAB — TSH: TSH: 0.78 u[IU]/mL (ref 0.35–5.50)

## 2011-07-17 LAB — BASIC METABOLIC PANEL
BUN: 24 mg/dL — ABNORMAL HIGH (ref 6–23)
CO2: 27 mEq/L (ref 19–32)
Glucose, Bld: 80 mg/dL (ref 70–99)

## 2011-07-17 LAB — LIPID PANEL
Cholesterol: 166 mg/dL (ref 0–200)
HDL: 66.8 mg/dL (ref >39.00)
Triglycerides: 136 mg/dL (ref 0.0–149.0)
VLDL: 27.2 mg/dL (ref 0.0–40.0)

## 2011-07-17 NOTE — Telephone Encounter (Signed)
Referral is done

## 2011-07-19 ENCOUNTER — Telehealth: Payer: Self-pay | Admitting: Family Medicine

## 2011-07-19 ENCOUNTER — Encounter: Payer: Self-pay | Admitting: Family Medicine

## 2011-07-19 NOTE — Telephone Encounter (Signed)
Left voice message with results.

## 2011-07-19 NOTE — Telephone Encounter (Signed)
Message copied by Baldemar Friday on Thu Jul 19, 2011  3:47 PM ------      Message from: Gershon Crane A      Created: Thu Jul 19, 2011 12:32 PM       normal

## 2011-07-25 ENCOUNTER — Institutional Professional Consult (permissible substitution): Payer: Medicare Other | Admitting: Critical Care Medicine

## 2011-07-30 ENCOUNTER — Telehealth: Payer: Self-pay | Admitting: Family Medicine

## 2011-07-30 NOTE — Telephone Encounter (Addendum)
Pt has appt for dexa scan at [facility] at [address] on 9-25. Please fax to [phone]

## 2011-07-30 NOTE — Telephone Encounter (Signed)
The order is in your box. Please fax this

## 2011-07-31 ENCOUNTER — Telehealth: Payer: Self-pay | Admitting: Family Medicine

## 2011-07-31 NOTE — Telephone Encounter (Signed)
Script faxed.

## 2011-07-31 NOTE — Telephone Encounter (Signed)
Pt requested a 30 day supply of Synthroid and call to [facility] on [address]. She is waiting on a mail order and might run out. I will call in.

## 2011-08-08 ENCOUNTER — Ambulatory Visit (INDEPENDENT_AMBULATORY_CARE_PROVIDER_SITE_OTHER): Payer: Medicare Other | Admitting: Critical Care Medicine

## 2011-08-08 ENCOUNTER — Encounter: Payer: Self-pay | Admitting: Critical Care Medicine

## 2011-08-08 ENCOUNTER — Ambulatory Visit (INDEPENDENT_AMBULATORY_CARE_PROVIDER_SITE_OTHER)
Admission: RE | Admit: 2011-08-08 | Discharge: 2011-08-08 | Disposition: A | Discharge status: Home / Self Care | Payer: Medicare Other | Source: Ambulatory Visit | Attending: Critical Care Medicine | Admitting: Critical Care Medicine

## 2011-08-08 VITALS — BP 136/98 | HR 97 | Temp 98.0°F | Ht 66.0 in | Wt 188.0 lb

## 2011-08-08 DIAGNOSIS — J45909 Unspecified asthma, uncomplicated: Secondary | ICD-10-CM

## 2011-08-08 DIAGNOSIS — R062 Wheezing: Secondary | ICD-10-CM

## 2011-08-08 MED ORDER — BUDESONIDE 180 MCG/ACT IN AEPB: 2.0000 | INHALATION_SPRAY | Freq: Two times a day (BID) | RESPIRATORY_TRACT | Status: DC

## 2011-08-08 NOTE — Patient Instructions (Signed)
Obtain a Chest xray today An overnight sleep oximetry will be obtained Start Pulmicort two puff twice daily Follow a reflux diet No other medication changes Return 6 weeks

## 2011-08-08 NOTE — Progress Notes (Signed)
Subjective:    Patient ID: Christina Jacobs, female    DOB: [birth year], 70 y.o.   MRN: [id number] 69 y.o.WF referred for dyspnea HPI Comments: Had surgery 12/11 for hiatal hernia,  Stomach up into the chest with LLL ATX, not able to separate the two and not successful, did staple intestines.    Shortness of Breath This is a chronic problem. The current episode started more than 1 year ago (dyspnea for three years ). The problem occurs every several days (exertion only if walks fast ). The problem has been unchanged. Duration: will recover in a few minutes, but depends on what the pt is doing. Associated symptoms include rhinorrhea, sputum production and wheezing. Pertinent negatives include no abdominal pain, chest pain, ear pain, fever, headaches, hemoptysis, leg pain, leg swelling, neck pain, orthopnea, PND, rash, sore throat or vomiting. The symptoms are aggravated by eating, any activity, exercise and smoke (symptoms worse in the evening before goes to bed, worse if eat heavy meal). Associated symptoms comments: Only a slight cough and mucus.  No dysphagia. There is no history of allergies, asthma, bronchiolitis, CAD, chronic lung disease, COPD, DVT, a heart failure, PE, pneumonia or a recent surgery.     Past Medical History  Diagnosis Date  . Hyperlipidemia   . Hypertension   . Hypothyroidism   . Osteopenia     last dexa 12-20-05  . Osteoarthritis     see's Dr. Charlett Blake  . Bursitis      Family History  Problem Relation Age of Onset  . Breast cancer Maternal Grandmother   . Diabetes    . Hyperlipidemia    . Hypertension    . Kidney cancer Mother   . Cancer Father   . Clotting disorder Brother      History   Social History  . Marital Status: Married    Spouse Name: N/A    Number of Children: N/A  . Years of Education: N/A   Occupational History  . Not on file.   Social History Main Topics  . Smoking status: Never Smoker   . Smokeless tobacco: Never Used  . Alcohol  Use: No  . Drug Use: No  . Sexually Active: Not on file   Other Topics Concern  . Not on file   Social History Narrative   RetiredMarried     Allergies  Allergen Reactions  . Codeine   . Meperidine Hcl     REACTION: unspecified  . Propoxyphene Hcl     REACTION: unspecified     Outpatient Prescriptions Prior to Visit  Medication Sig Dispense Refill  . aspirin 81 MG tablet Take 81 mg by mouth daily.        Marland Kitchen atorvastatin (LIPITOR) 10 MG tablet Take 1 tablet (10 mg total) by mouth daily.  90 tablet  3  . levothyroxine (SYNTHROID) 100 MCG tablet Take 1 tablet (100 mcg total) by mouth daily.  90 tablet  3  . losartan (COZAAR) 50 MG tablet Take 1 tablet (50 mg total) by mouth daily.  90 tablet  3  . metoprolol (TOPROL XL) 50 MG 24 hr tablet Take 1 tablet (50 mg total) by mouth daily.  90 tablet  3  . MULTIPLE VITAMIN PO Take by mouth.        . multivitamin-lutein (OCUVITE-LUTEIN) CAPS Take 1 capsule by mouth daily.        Marland Kitchen triamterene-hydrochlorothiazide (DYAZIDE) 37.5-25 MG per capsule Take 1 each (1 capsule total) by mouth daily.  90 capsule  3  . pramoxine-hydrocortisone (ANALPRAM HC) cream Apply topically as needed.       . lidocaine (LIDODERM) 5 % Place 1 patch onto the skin daily. Remove & Discard patch within 12 hours or as directed by MD       . omeprazole (PRILOSEC) 40 MG capsule Take 1 capsule (40 mg total) by mouth daily.  90 capsule  3  . traMADol (ULTRAM) 50 MG tablet Take 1 tablet (50 mg total) by mouth every 6 (six) hours as needed.  180 tablet  3  . valACYclovir (VALTREX) 1000 MG tablet Take 1 tablet (1,000 mg total) by mouth 2 (two) times daily. As directed  180 tablet  3      Review of Systems  Constitutional: Positive for diaphoresis. Negative for fever, chills, activity change, appetite change, fatigue and unexpected weight change.  HENT: Positive for rhinorrhea, sneezing, voice change and postnasal drip. Negative for hearing loss, ear pain, nosebleeds,  congestion, sore throat, facial swelling, mouth sores, trouble swallowing, neck pain, neck stiffness, dental problem, sinus pressure, tinnitus and ear discharge.   Eyes: Positive for itching. Negative for photophobia, discharge and visual disturbance.  Respiratory: Positive for sputum production, shortness of breath and wheezing. Negative for apnea, cough, hemoptysis, choking, chest tightness and stridor.   Cardiovascular: Negative for chest pain, palpitations, orthopnea, leg swelling and PND.  Gastrointestinal: Negative for nausea, vomiting, abdominal pain, constipation, blood in stool and abdominal distention.  Genitourinary: Negative for dysuria, urgency, frequency, hematuria, flank pain, decreased urine volume and difficulty urinating.  Musculoskeletal: Positive for arthralgias. Negative for myalgias, back pain, joint swelling and gait problem.  Skin: Negative for color change, pallor and rash.  Neurological: Positive for dizziness. Negative for tremors, seizures, syncope, speech difficulty, weakness, light-headedness, numbness and headaches.  Hematological: Negative for adenopathy. Does not bruise/bleed easily.  Psychiatric/Behavioral: Positive for sleep disturbance. Negative for confusion and agitation. The patient is nervous/anxious.        Objective:   Physical Exam Filed Vitals:   08/08/11 0948  BP: 136/98  Pulse: 97  Temp: 98 F (36.7 C)  TempSrc: Oral  Height: 5\' 6"  (1.676 m)  Weight: 188 lb (85.276 kg)  SpO2: 94%    Gen: Pleasant, well-nourished, in no distress,  normal affect  ENT: No lesions,  mouth clear,  oropharynx clear, no postnasal drip  Neck: No JVD, no TMG, no carotid bruits  Lungs: No use of accessory muscles, no dullness to percussion, distant BS  Cardiovascular: RRR, heart sounds normal, no murmur or gallops, no peripheral edema  Abdomen: soft and NT, no HSM,  BS normal  Musculoskeletal: No deformities, no cyanosis or clubbing  Neuro: alert, non  focal  Skin: Warm, no lesions or rashes   10/3 spiro: FeV1 78% FVC 78% Fev1/FVC: 101% Fef 25-75:  75%  CXR 08/08/11 Findings: The patient has a massive hiatal hernia. There is some left basilar atelectasis from hernia. Lungs otherwise clear. Cardiac silhouette is obscured.  IMPRESSION: Massive hiatal hernia with some left basilar atelectasis. Stable compared to prior exam.       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 aspirin 81 MG tablet Take 81 mg by mouth daily.        Marland Kitchen  atorvastatin (LIPITOR) 10 MG tablet Take 1 tablet (10 mg total) by mouth daily.  90 tablet  3  . levothyroxine (SYNTHROID) 100 MCG tablet Take 1 tablet (100 mcg total) by mouth daily.  90 tablet  3  . losartan (COZAAR) 50 MG tablet Take 1 tablet (50 mg total) by mouth daily.  90 tablet  3  . metoprolol (TOPROL XL) 50 MG 24 hr tablet Take 1 tablet (50 mg total) by mouth daily.  90 tablet  3  . MULTIPLE VITAMIN PO Take by mouth.        . multivitamin-lutein (OCUVITE-LUTEIN) CAPS Take 1 capsule by mouth daily.        Marland Kitchen triamterene-hydrochlorothiazide (DYAZIDE) 37.5-25 MG per capsule Take 1 each (1 capsule total) by mouth daily.  90 capsule  3  . DISCONTD: pramoxine-hydrocortisone (ANALPRAM HC) cream Apply topically as needed.       . budesonide (PULMICORT FLEXHALER) 180 MCG/ACT inhaler Inhale 2 puffs into the lungs 2 (two) times daily.  1 each  6  . DISCONTD: lidocaine (LIDODERM) 5 % Place 1 patch onto the skin daily. Remove & Discard patch within 12 hours or as directed by MD       . DISCONTD: omeprazole (PRILOSEC) 40 MG capsule Take 1 capsule (40 mg total) by mouth daily.  90 capsule  3  . DISCONTD: traMADol (ULTRAM)  50 MG tablet Take 1 tablet (50 mg total) by mouth every 6 (six) hours as needed.  180 tablet  3  . DISCONTD: valACYclovir (VALTREX) 1000 MG tablet Take 1 tablet (1,000 mg total) by mouth 2 (two) times daily. As directed  180 tablet  3

## 2011-08-09 ENCOUNTER — Telehealth: Payer: Self-pay | Admitting: *Deleted

## 2011-08-09 ENCOUNTER — Telehealth: Payer: Self-pay | Admitting: Critical Care Medicine

## 2011-08-09 NOTE — Telephone Encounter (Signed)
Pls advise of cxr results from 08/08/2011 OV.

## 2011-08-09 NOTE — Telephone Encounter (Signed)
Call pt and tell her CXR shows hiatal hernia, unchanged from prior film of 9/11

## 2011-08-09 NOTE — Assessment & Plan Note (Addendum)
Due to microaspiration d/t reflux and large hiatal hernia, hypoxemia resolved and episodic Lower airway inflammation noted Plan Trial ICS ONO ra Note cxr stable Reflux diet

## 2011-08-09 NOTE — Telephone Encounter (Signed)
Pt is calling for bone density results, please.

## 2011-08-10 NOTE — Telephone Encounter (Signed)
I have not seen any results yet.

## 2011-08-10 NOTE — Telephone Encounter (Signed)
Spoke with pt and she is going to call and get the results sent over here.

## 2011-08-10 NOTE — Telephone Encounter (Signed)
I spoke with patient about results and he verbalized understanding and had no questions 

## 2011-08-13 ENCOUNTER — Encounter: Payer: Self-pay | Admitting: Family Medicine

## 2011-08-16 ENCOUNTER — Encounter: Payer: Self-pay | Admitting: Critical Care Medicine

## 2011-08-16 ENCOUNTER — Telehealth: Payer: Self-pay | Admitting: Critical Care Medicine

## 2011-08-16 DIAGNOSIS — J45901 Unspecified asthma with (acute) exacerbation: Secondary | ICD-10-CM

## 2011-08-16 NOTE — Telephone Encounter (Signed)
Order was given to [facility] by [facility]

## 2011-08-16 NOTE — Telephone Encounter (Signed)
ONO positive , desats 21 times/ hour,  Low of 82% Pt needs O2 QHS Will use [facility]

## 2011-08-21 ENCOUNTER — Encounter: Payer: Self-pay | Admitting: Critical Care Medicine

## 2011-08-22 ENCOUNTER — Telehealth: Payer: Self-pay | Admitting: Family Medicine

## 2011-08-22 NOTE — Telephone Encounter (Signed)
I gave pt the results for the bone density scan. She has stopped the Actonel due to the advise of pulmonary. Do you want to replace the Actonel with anything else? Also pt wants a copy of the scan put in mail.

## 2011-08-23 NOTE — Telephone Encounter (Signed)
No for right now just take calcium 1500 mg a day and vitamin D 2000 units a day

## 2011-08-23 NOTE — Telephone Encounter (Signed)
Spoke with pt and put a copy of scan in mail.

## 2011-08-27 ENCOUNTER — Telehealth: Payer: Self-pay | Admitting: Critical Care Medicine

## 2011-08-27 NOTE — Telephone Encounter (Signed)
Patient returning call.

## 2011-08-27 NOTE — Telephone Encounter (Signed)
I tried to call pt, no answer.  Try to call her and tell her I need to see her in the office for f/u visit prior to pulling the oxygen  Does she have a cell number??  The number in the chart is home number.   She needs an appt for discussion of the oxygen issue.

## 2011-08-27 NOTE — Telephone Encounter (Signed)
I spoke with pt and she states the oxygen tanks are too big and too loud. Pt states she can't sleep with the oxygen and ends up taking it off. Pt states she is not that sick and is upset by the way the whole oxygen ordeal was went about. Pt states Dr. Delford Field left her message advising her she would be set up on oxygen and before she could call our office back, the home care company was outside her door unloading all the oxygen and equipment. Pt states she wants an order sent back to the home care company to remove the oxygen bc she doesn't feel she needs it. Pt aware PW is not in the office until Thursday, Please advise Dr. Delford Field, thanks  Carver Fila, CMA

## 2011-08-27 NOTE — Telephone Encounter (Signed)
I spoke with pt and she agree's that she needs an OV to discuss the oxygen therapy. Pt is scheduled to see PW in the [facility] location on Thursday at 11:!5. Pt is aware of location, phone #, date and time. Pt had no further questions. Will forward to Dr. Delford Field so he is aware of the apt date. Please advise, thanks  Carver Fila, CMA

## 2011-08-30 ENCOUNTER — Encounter: Payer: Self-pay | Admitting: Critical Care Medicine

## 2011-08-30 ENCOUNTER — Ambulatory Visit (INDEPENDENT_AMBULATORY_CARE_PROVIDER_SITE_OTHER): Payer: Medicare Other | Admitting: Critical Care Medicine

## 2011-08-30 DIAGNOSIS — K219 Gastro-esophageal reflux disease without esophagitis: Secondary | ICD-10-CM

## 2011-08-30 DIAGNOSIS — J45909 Unspecified asthma, uncomplicated: Secondary | ICD-10-CM

## 2011-08-30 MED ORDER — BUDESONIDE 180 MCG/ACT IN AEPB: 2.0000 | INHALATION_SPRAY | Freq: Two times a day (BID) | RESPIRATORY_TRACT | Status: DC

## 2011-08-30 MED ORDER — OMEPRAZOLE 40 MG PO CPDR: 40.0000 mg | DELAYED_RELEASE_CAPSULE | Freq: Every day | ORAL | Status: DC

## 2011-08-30 NOTE — Progress Notes (Signed)
Subjective:    Patient ID: Christina Jacobs, female    DOB: 1941/03/19, 70 y.o.   MRN: [id number] 70 y.o.WF referred for dyspnea HPI Comments: Had surgery 12/11 for hiatal hernia,  Stomach up into the chest with LLL ATX, not able to separate the two and not successful, did staple intestines.    Shortness of Breath This is a chronic problem. The current episode started more than 1 year ago (dyspnea for three years ). The problem occurs every several days (exertion only if walks fast ). The problem has been unchanged. Duration: will recover in a few minutes, but depends on what the pt is doing. Associated symptoms include sputum production. Pertinent negatives include no abdominal pain, chest pain, ear pain, fever, headaches, hemoptysis, leg pain, leg swelling, neck pain, orthopnea, PND, rash, rhinorrhea, sore throat, vomiting or wheezing. The symptoms are aggravated by eating, any activity, exercise and smoke (symptoms worse in the evening before goes to bed, worse if eat heavy meal). Associated symptoms comments: Only a slight cough and mucus.  No dysphagia. There is no history of allergies, asthma, bronchiolitis, CAD, chronic lung disease, COPD, DVT, a heart failure, PE, pneumonia or a recent surgery.   08/30/2011  Since last ov pt has been nonadherent to portable oxygen d/t inability to tolerate noise levels Pt denies any significant sore throat, nasal congestion or excess secretions, fever, chills, sweats, unintended weight loss, pleurtic or exertional chest pain, orthopnea PND, or leg swelling Pt denies any increase in rescue therapy over baseline, denies waking up needing it or having any early am or nocturnal exacerbations of coughing/wheezing/or dyspnea. Pt also denies any obvious fluctuation in symptoms with  weather or environmental change or other alleviating or aggravating factors  Dyspnea is better and is cough less d/t pulmicort and PPI use   Past Medical History  Diagnosis Date  .  Hyperlipidemia   . Hypertension   . Hypothyroidism   . Osteopenia     last dexa 12-20-05  . Osteoarthritis     see's Dr. Charlett Blake  . Bursitis      Family History  Problem Relation Age of Onset  . Breast cancer Maternal Grandmother   . Diabetes    . Hyperlipidemia    . Hypertension    . Kidney cancer Mother   . Cancer Father   . Clotting disorder Brother      History   Social History  . Marital Status: Married    Spouse Name: N/A    Number of Children: N/A  . Years of Education: N/A   Occupational History  . Not on file.   Social History Main Topics  . Smoking status: Never Smoker   . Smokeless tobacco: Never Used  . Alcohol Use: No  . Drug Use: No  . Sexually Active: Not on file   Other Topics Concern  . Not on file   Social History Narrative   RetiredMarried     Allergies  Allergen Reactions  . Codeine   . Meperidine Hcl     REACTION: unspecified  . Propoxyphene Hcl     REACTION: unspecified     Outpatient Prescriptions Prior to Visit  Medication Sig Dispense Refill  . aspirin 81 MG tablet Take 81 mg by mouth daily.        Marland Kitchen atorvastatin (LIPITOR) 10 MG tablet Take 1 tablet (10 mg total) by mouth daily.  90 tablet  3  . levothyroxine (SYNTHROID) 100 MCG tablet Take 1 tablet (100 mcg  total) by mouth daily.  90 tablet  3  . losartan (COZAAR) 50 MG tablet Take 1 tablet (50 mg total) by mouth daily.  90 tablet  3  . metoprolol (TOPROL XL) 50 MG 24 hr tablet Take 1 tablet (50 mg total) by mouth daily.  90 tablet  3  . MULTIPLE VITAMIN PO Take by mouth.        . multivitamin-lutein (OCUVITE-LUTEIN) CAPS Take 1 capsule by mouth daily.        Marland Kitchen triamterene-hydrochlorothiazide (DYAZIDE) 37.5-25 MG per capsule Take 1 each (1 capsule total) by mouth daily.  90 capsule  3  . budesonide (PULMICORT FLEXHALER) 180 MCG/ACT inhaler Inhale 2 puffs into the lungs 2 (two) times daily.  1 each  6      Review of Systems  Constitutional: Negative for fever, chills,  diaphoresis, activity change, appetite change, fatigue and unexpected weight change.  HENT: Positive for voice change. Negative for hearing loss, ear pain, nosebleeds, congestion, sore throat, facial swelling, rhinorrhea, sneezing, mouth sores, trouble swallowing, neck pain, neck stiffness, dental problem, postnasal drip, sinus pressure, tinnitus and ear discharge.   Eyes: Negative for photophobia, discharge, itching and visual disturbance.  Respiratory: Positive for sputum production. Negative for apnea, cough, hemoptysis, choking, chest tightness, shortness of breath, wheezing and stridor.   Cardiovascular: Negative for chest pain, palpitations, orthopnea, leg swelling and PND.  Gastrointestinal: Negative for nausea, vomiting, abdominal pain, constipation, blood in stool and abdominal distention.  Genitourinary: Negative for dysuria, urgency, frequency, hematuria, flank pain, decreased urine volume and difficulty urinating.  Musculoskeletal: Positive for arthralgias. Negative for myalgias, back pain, joint swelling and gait problem.  Skin: Negative for color change, pallor and rash.  Neurological: Positive for dizziness. Negative for tremors, seizures, syncope, speech difficulty, weakness, light-headedness, numbness and headaches.  Hematological: Negative for adenopathy. Does not bruise/bleed easily.  Psychiatric/Behavioral: Positive for sleep disturbance. Negative for confusion and agitation. The patient is nervous/anxious.        Objective:   Physical Exam Filed Vitals:   08/30/11 1107  BP: 150/96  Pulse: 84  Temp: 98.1 F (36.7 C)  TempSrc: Oral  Height: 5\' 6"  (1.676 m)  Weight: 83.008 kg (183 lb)  SpO2: 96%    Gen: Pleasant, well-nourished, in no distress,  normal affect  ENT: No lesions,  mouth clear,  oropharynx clear, no postnasal drip  Neck: No JVD, no TMG, no carotid bruits  Lungs: No use of accessory muscles, no dullness to percussion, distant BS  Cardiovascular: RRR,  heart sounds normal, no murmur or gallops, no peripheral edema  Abdomen: soft and NT, no HSM,  BS normal  Musculoskeletal: No deformities, no cyanosis or clubbing  Neuro: alert, non focal  Skin: Warm, no lesions or rashes   10/3 spiro: FeV1 78% FVC 78% Fev1/FVC: 101% Fef 25-75:  75%  CXR 08/08/11 Findings: The patient has a massive hiatal hernia. There is some left basilar atelectasis from hernia. Lungs otherwise clear. Cardiac silhouette is obscured.  IMPRESSION: Massive hiatal hernia with some left basilar atelectasis. Stable compared to prior exam.       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 aspirin 81 MG tablet Take 81 mg by mouth daily.        Marland Kitchen atorvastatin (LIPITOR) 10 MG tablet Take 1 tablet (10 mg total) by mouth daily.  90 tablet  3  . budesonide (PULMICORT FLEXHALER) 180 MCG/ACT inhaler Inhale 2 puffs into the lungs 2 (two) times daily.  3 each  4  . levothyroxine (SYNTHROID) 100 MCG tablet Take 1 tablet (100 mcg total) by mouth daily.  90 tablet  3  . losartan (COZAAR) 50 MG tablet Take 1 tablet (50 mg total) by mouth daily.  90 tablet  3  . metoprolol (TOPROL XL) 50 MG 24 hr tablet Take 1 tablet (50 mg total) by mouth daily.  90 tablet  3  . MULTIPLE VITAMIN PO Take by mouth.        . multivitamin-lutein (OCUVITE-LUTEIN) CAPS Take 1 capsule by mouth daily.        Marland Kitchen omeprazole (PRILOSEC) 40 MG capsule Take 1 capsule (40  mg total) by mouth daily.  90 capsule  4  . triamterene-hydrochlorothiazide (DYAZIDE) 37.5-25 MG per capsule Take 1 each (1 capsule total) by mouth daily.  90 capsule  3  . DISCONTD: budesonide (PULMICORT FLEXHALER) 180 MCG/ACT inhaler Inhale 2 puffs into the lungs 2 (two) times daily.  1 each  6  . DISCONTD: omeprazole (PRILOSEC) 40 MG capsule Take 40 mg by mouth daily.

## 2011-08-30 NOTE — Patient Instructions (Signed)
Stay on oxygen 2liter at night, we will try to obtain a quieter portable concentrator Stay on pulmicort twice daily Stay on prilosec daily Return 4 months

## 2011-08-30 NOTE — Telephone Encounter (Signed)
We discussed this today

## 2011-08-30 NOTE — Assessment & Plan Note (Signed)
Due to microaspiration d/t reflux and large hiatal hernia 10/3 spiro: FeV1 78% FVC 78% Fev1/FVC: 101% Fef 25-75:  75%  Amb pulse ox 10/3: Spo2 >94% ONO on RA 10/5: Desat 82%  21 time/hour., O2 QHS ordered Pt non adherent to nocturnal oxygen due to concentrator noise and lack of portability as pt travels frequently Plan Obtain lightweight portable quieter concentrator Cont pulmicort bid and PPI daily Cont reflux diet

## 2011-09-25 ENCOUNTER — Ambulatory Visit: Payer: Medicare Other | Admitting: Critical Care Medicine

## 2011-09-27 ENCOUNTER — Other Ambulatory Visit: Payer: Self-pay | Admitting: Family Medicine

## 2011-11-06 DIAGNOSIS — G7000 Myasthenia gravis without (acute) exacerbation: Secondary | ICD-10-CM

## 2011-11-06 HISTORY — DX: Myasthenia gravis without (acute) exacerbation: G70.00

## 2011-12-27 ENCOUNTER — Other Ambulatory Visit: Payer: Self-pay | Admitting: Ophthalmology

## 2011-12-27 DIAGNOSIS — H4900 Third [oculomotor] nerve palsy, unspecified eye: Secondary | ICD-10-CM

## 2012-01-01 ENCOUNTER — Ambulatory Visit: Payer: Self-pay | Admitting: Ophthalmology

## 2012-01-03 ENCOUNTER — Other Ambulatory Visit: Payer: Self-pay

## 2012-01-09 ENCOUNTER — Telehealth: Payer: Self-pay | Admitting: *Deleted

## 2012-01-09 NOTE — Telephone Encounter (Signed)
Her GFR was slightly below normal last September, but her creatinine was normal. I felt that her renal function was okay. Have her make an OV with me soon to talk about this further. Tell her to drink plenty of water the night before and the morning of this visit. We will repeat her labs that day to see where we are

## 2012-01-09 NOTE — Telephone Encounter (Signed)
Pt was scheduled for a MRI with her eye Doctor, but when she got there, she was told her GFR was too high to do  the contrast???  Had never been told of this.  What should she do?

## 2012-01-09 NOTE — Telephone Encounter (Signed)
Opened by error.

## 2012-01-10 NOTE — Telephone Encounter (Signed)
Appt made

## 2012-01-17 ENCOUNTER — Ambulatory Visit: Payer: Medicare Other | Admitting: Critical Care Medicine

## 2012-01-17 ENCOUNTER — Ambulatory Visit (INDEPENDENT_AMBULATORY_CARE_PROVIDER_SITE_OTHER): Payer: Medicare Other | Admitting: Critical Care Medicine

## 2012-01-17 ENCOUNTER — Encounter: Payer: Self-pay | Admitting: Critical Care Medicine

## 2012-01-17 VITALS — BP 146/94 | HR 74 | Temp 98.1°F | Ht 66.0 in | Wt 184.0 lb

## 2012-01-17 DIAGNOSIS — J45909 Unspecified asthma, uncomplicated: Secondary | ICD-10-CM

## 2012-01-17 MED ORDER — BUDESONIDE 180 MCG/ACT IN AEPB: 2.0000 | INHALATION_SPRAY | Freq: Two times a day (BID) | RESPIRATORY_TRACT | Status: DC

## 2012-01-17 NOTE — Patient Instructions (Signed)
No change in medications. Return in        6 months        

## 2012-01-17 NOTE — Progress Notes (Signed)
Subjective:    Patient ID: Christina Jacobs, female    DOB: 03-16-1941, 71 y.o.   MRN: [id number] 71 y.o.WF referred for dyspnea HPI Comments: Had surgery 12/11 for hiatal hernia,  Stomach up into the chest with LLL ATX, not able to separate the two and not successful, did staple intestines.    Shortness of Breath This is a chronic problem. The current episode started more than 1 year ago (dyspnea for three years ). The problem occurs every several days (exertion only if walks fast ). The problem has been unchanged. Duration: will recover in a few minutes, but depends on what the pt is doing. Associated symptoms include sputum production. Pertinent negatives include no abdominal pain, chest pain, ear pain, fever, headaches, hemoptysis, leg pain, leg swelling, neck pain, orthopnea, PND, rash, rhinorrhea, sore throat, vomiting or wheezing. The symptoms are aggravated by eating, any activity, exercise and smoke (symptoms worse in the evening before goes to bed, worse if eat heavy meal). Associated symptoms comments: Only a slight cough and mucus.  No dysphagia. There is no history of allergies, asthma, bronchiolitis, CAD, chronic lung disease, COPD, DVT, a heart failure, PE, pneumonia or a recent surgery.   10/12 Since last ov pt has been nonadherent to portable oxygen d/t inability to tolerate noise levels Pt denies any significant sore throat, nasal congestion or excess secretions, fever, chills, sweats, unintended weight loss, pleurtic or exertional chest pain, orthopnea PND, or leg swelling Pt denies any increase in rescue therapy over baseline, denies waking up needing it or having any early am or nocturnal exacerbations of coughing/wheezing/or dyspnea. Pt also denies any obvious fluctuation in symptoms with  weather or environmental change or other alleviating or aggravating factors  Dyspnea is better and is cough less d/t pulmicort and PPI use   01/18/2012 Feels better,  Less dyspnea and  cough.  On oxygen at night. Now with double vision.  On pulmicort.   MRI of head is normal.   Past Medical History  Diagnosis Date  . Hyperlipidemia   . Hypertension   . Hypothyroidism   . Osteopenia     last dexa 12-20-05  . Osteoarthritis     see's Dr. Charlett Blake  . Bursitis      Family History  Problem Relation Age of Onset  . Breast cancer Maternal Grandmother   . Diabetes    . Hyperlipidemia    . Hypertension    . Kidney cancer Mother   . Cancer Father   . Clotting disorder Brother      History   Social History  . Marital Status: Married    Spouse Name: N/A    Number of Children: N/A  . Years of Education: N/A   Occupational History  . Not on file.   Social History Main Topics  . Smoking status: Never Smoker   . Smokeless tobacco: Never Used  . Alcohol Use: Yes     rare  . Drug Use: No  . Sexually Active: Not on file   Other Topics Concern  . Not on file   Social History Narrative   RetiredMarried     Allergies  Allergen Reactions  . Codeine   . Meperidine Hcl     REACTION: unspecified  . Propoxyphene Hcl     REACTION: unspecified     Outpatient Prescriptions Prior to Visit  Medication Sig Dispense Refill  . aspirin 81 MG tablet Take 81 mg by mouth daily.        Marland Kitchen  atorvastatin (LIPITOR) 10 MG tablet Take 1 tablet (10 mg total) by mouth daily.  90 tablet  3  . levothyroxine (SYNTHROID, LEVOTHROID) 100 MCG tablet TAKE 1 TABLET BY MOUTH EVERY DAY  30 tablet  11  . losartan (COZAAR) 50 MG tablet Take 1 tablet (50 mg total) by mouth daily.  90 tablet  3  . metoprolol (TOPROL XL) 50 MG 24 hr tablet Take 1 tablet (50 mg total) by mouth daily.  90 tablet  3  . MULTIPLE VITAMIN PO Take by mouth.        . multivitamin-lutein (OCUVITE-LUTEIN) CAPS Take 1 capsule by mouth daily.        Marland Kitchen triamterene-hydrochlorothiazide (DYAZIDE) 37.5-25 MG per capsule Take 1 each (1 capsule total) by mouth daily.  90 capsule  3  . budesonide (PULMICORT FLEXHALER) 180  MCG/ACT inhaler Inhale 2 puffs into the lungs 2 (two) times daily.  3 each  4  . omeprazole (PRILOSEC) 40 MG capsule Take 1 capsule (40 mg total) by mouth daily.  90 capsule  4      Review of Systems  Constitutional: Negative for fever, chills, diaphoresis, activity change, appetite change, fatigue and unexpected weight change.  HENT: Positive for voice change. Negative for hearing loss, ear pain, nosebleeds, congestion, sore throat, facial swelling, rhinorrhea, sneezing, mouth sores, trouble swallowing, neck pain, neck stiffness, dental problem, postnasal drip, sinus pressure, tinnitus and ear discharge.   Eyes: Negative for photophobia, discharge, itching and visual disturbance.  Respiratory: Positive for sputum production. Negative for apnea, cough, hemoptysis, choking, chest tightness, shortness of breath, wheezing and stridor.   Cardiovascular: Negative for chest pain, palpitations, orthopnea, leg swelling and PND.  Gastrointestinal: Negative for nausea, vomiting, abdominal pain, constipation, blood in stool and abdominal distention.  Genitourinary: Negative for dysuria, urgency, frequency, hematuria, flank pain, decreased urine volume and difficulty urinating.  Musculoskeletal: Positive for arthralgias. Negative for myalgias, back pain, joint swelling and gait problem.  Skin: Negative for color change, pallor and rash.  Neurological: Positive for dizziness. Negative for tremors, seizures, syncope, speech difficulty, weakness, light-headedness, numbness and headaches.  Hematological: Negative for adenopathy. Does not bruise/bleed easily.  Psychiatric/Behavioral: Positive for sleep disturbance. Negative for confusion and agitation. The patient is nervous/anxious.        Objective:   Physical Exam  Filed Vitals:   01/17/12 1425  BP: 146/94  Pulse: 74  Temp: 98.1 F (36.7 C)  TempSrc: Oral  Height: 5\' 6"  (1.676 m)  Weight: 184 lb (83.462 kg)  SpO2: 93%    Gen: Pleasant,  well-nourished, in no distress,  normal affect  ENT: No lesions,  mouth clear,  oropharynx clear, no postnasal drip  Neck: No JVD, no TMG, no carotid bruits  Lungs: No use of accessory muscles, no dullness to percussion, distant BS  Cardiovascular: RRR, heart sounds normal, no murmur or gallops, no peripheral edema  Abdomen: soft and NT, no HSM,  BS normal  Musculoskeletal: No deformities, no cyanosis or clubbing  Neuro: alert, non focal  Skin: Warm, no lesions or rashes   10/3 spiro: FeV1 78% FVC 78% Fev1/FVC: 101% Fef 25-75:  75%  CXR 08/08/11 Findings: The patient has a massive hiatal hernia. There is some left basilar atelectasis from hernia. Lungs otherwise clear. Cardiac silhouette is obscured.  IMPRESSION: Massive hiatal hernia with some left basilar atelectasis. Stable compared to prior exam.       Assessment & Plan:   Asthma, extrinsic Due to microaspiration d/t reflux and large hiatal  hernia>>>improved  10/3 spiro: FeV1 78% FVC 78% Fev1/FVC: 101% Fef 25-75:  75%  Amb pulse ox 10/3: Spo2 >94% ONO on RA 10/5: Desat 82%  21 time/hour., O2 QHS ordered Pt non adherent to nocturnal oxygen due to concentrator noise and lack of portability as pt travels frequently Plan Cont oxygen QHS Cont pulmicort bid and PPI daily Cont reflux diet       Updated Medication List Outpatient Encounter Prescriptions as of 01/17/2012  Medication Sig Dispense Refill  . aspirin 81 MG tablet Take 81 mg by mouth daily.        Marland Kitchen atorvastatin (LIPITOR) 10 MG tablet Take 1 tablet (10 mg total) by mouth daily.  90 tablet  3  . budesonide (PULMICORT FLEXHALER) 180 MCG/ACT inhaler Inhale 2 puffs into the lungs 2 (two) times daily.  3 each  4  . levothyroxine (SYNTHROID, LEVOTHROID) 100 MCG tablet TAKE 1 TABLET BY MOUTH EVERY DAY  30 tablet  11  . losartan (COZAAR) 50 MG tablet Take 1 tablet (50 mg total) by mouth daily.  90 tablet  3  . metoprolol (TOPROL XL) 50 MG 24 hr tablet  Take 1 tablet (50 mg total) by mouth daily.  90 tablet  3  . MULTIPLE VITAMIN PO Take by mouth.        . multivitamin-lutein (OCUVITE-LUTEIN) CAPS Take 1 capsule by mouth daily.        Marland Kitchen triamterene-hydrochlorothiazide (DYAZIDE) 37.5-25 MG per capsule Take 1 each (1 capsule total) by mouth daily.  90 capsule  3  . DISCONTD: budesonide (PULMICORT FLEXHALER) 180 MCG/ACT inhaler Inhale 2 puffs into the lungs 2 (two) times daily.  3 each  4  . DISCONTD: omeprazole (PRILOSEC) 40 MG capsule Take 1 capsule (40 mg total) by mouth daily.  90 capsule  4

## 2012-01-18 ENCOUNTER — Ambulatory Visit (INDEPENDENT_AMBULATORY_CARE_PROVIDER_SITE_OTHER): Payer: Medicare Other | Admitting: Family Medicine

## 2012-01-18 ENCOUNTER — Encounter: Payer: Self-pay | Admitting: Family Medicine

## 2012-01-18 VITALS — BP 138/86 | HR 97 | Temp 97.5°F | Wt 182.0 lb

## 2012-01-18 DIAGNOSIS — I10 Essential (primary) hypertension: Secondary | ICD-10-CM

## 2012-01-18 NOTE — Progress Notes (Signed)
  Subjective:    Patient ID: Christina Jacobs, female    DOB: 1941/01/09, 71 y.o.   MRN: [id number]  HPI Here to ask about her renal function. 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 However the radiologists refused to do this with contrast because her GFR was found to be 47. Her creatinine was normal at 1.1. She asks me if this should be addressed. Of note, she very rarely takes any OTC NSAIDs. We have found her to have borderline renal function for the past few years as well. Our last GFR last September was slightly low at 51 but her creatinine was normal at 1.1. She did have one creatinine of 1.3 several years ago which then normalized. She drinks plenty of water, almost no sodas. Her HTN has been well controlled. She is not diabetic. She is also concerned because her mother had some renal disease.   Review of Systems  Constitutional: Negative.   Respiratory: Negative.   Cardiovascular: Negative.        Objective:   Physical Exam  Constitutional: She appears well-developed and well-nourished.  Cardiovascular: Normal rate, regular rhythm, normal heart sounds and intact distal pulses.   Pulmonary/Chest: Effort normal and breath sounds normal.  Musculoskeletal: She exhibits no edema.          Assessment & Plan:  She has borderline but stable renal function over the past few years. I told her what we need to do is control the BP as well as possible and watch her renal values closely. I offered to refer her to Nephrology but she declined. We will plan on checking a BMET every 3 months to monitor this closely.

## 2012-01-18 NOTE — Assessment & Plan Note (Signed)
Due to microaspiration d/t reflux and large hiatal hernia>>>improved  10/3 spiro: FeV1 78% FVC 78% Fev1/FVC: 101% Fef 25-75:  75%  Amb pulse ox 10/3: Spo2 >94% ONO on RA 10/5: Desat 82%  21 time/hour., O2 QHS ordered Pt non adherent to nocturnal oxygen due to concentrator noise and lack of portability as pt travels frequently Plan Cont oxygen QHS Cont pulmicort bid and PPI daily Cont reflux diet

## 2012-04-21 ENCOUNTER — Other Ambulatory Visit: Payer: Medicare Other

## 2012-04-24 ENCOUNTER — Other Ambulatory Visit (INDEPENDENT_AMBULATORY_CARE_PROVIDER_SITE_OTHER): Payer: Medicare Other

## 2012-04-24 DIAGNOSIS — I10 Essential (primary) hypertension: Secondary | ICD-10-CM

## 2012-04-24 LAB — BASIC METABOLIC PANEL
CO2: 30 mEq/L (ref 19–32)
GFR: 46.17 mL/min — ABNORMAL LOW (ref >60.00)
Glucose, Bld: 103 mg/dL — ABNORMAL HIGH (ref 70–99)
Potassium: 3.7 mEq/L (ref 3.5–5.1)
Sodium: 140 mEq/L (ref 135–145)

## 2012-04-28 NOTE — Progress Notes (Signed)
Quick Note:  I spoke with pt ______ 

## 2012-05-14 ENCOUNTER — Other Ambulatory Visit: Payer: Self-pay | Admitting: *Deleted

## 2012-05-14 ENCOUNTER — Ambulatory Visit
Admission: RE | Admit: 2012-05-14 | Discharge: 2012-05-14 | Disposition: A | Discharge status: Home / Self Care | Payer: Medicare Other | Source: Ambulatory Visit | Attending: *Deleted | Admitting: *Deleted

## 2012-05-14 DIAGNOSIS — G7000 Myasthenia gravis without (acute) exacerbation: Secondary | ICD-10-CM

## 2012-07-14 ENCOUNTER — Ambulatory Visit (INDEPENDENT_AMBULATORY_CARE_PROVIDER_SITE_OTHER): Payer: Medicare Other | Admitting: Critical Care Medicine

## 2012-07-14 ENCOUNTER — Encounter: Payer: Self-pay | Admitting: Critical Care Medicine

## 2012-07-14 VITALS — BP 130/80 | HR 101 | Temp 97.7°F | Ht 66.0 in | Wt 183.0 lb

## 2012-07-14 DIAGNOSIS — J45909 Unspecified asthma, uncomplicated: Secondary | ICD-10-CM

## 2012-07-14 DIAGNOSIS — Z23 Encounter for immunization: Secondary | ICD-10-CM

## 2012-07-14 DIAGNOSIS — R911 Solitary pulmonary nodule: Secondary | ICD-10-CM | POA: Insufficient documentation

## 2012-07-14 DIAGNOSIS — G7000 Myasthenia gravis without (acute) exacerbation: Secondary | ICD-10-CM

## 2012-07-14 NOTE — Progress Notes (Signed)
Subjective:    Patient ID: Christina Jacobs, female    DOB: 24-Jul-1941, 71 y.o.   MRN: [id number]   71 y.o.WF with cyclical cough and moderate persistent asthma  due to microaspiration d/t reflux and large hiatal hernia  HPI Comments: Had surgery 12/11 for hiatal hernia,  Stomach up into the chest with LLL ATX, not able to separate the two and not successful, did staple intestines.    10/12 Since last ov pt has been nonadherent to portable oxygen d/t inability to tolerate noise levels Pt denies any significant sore throat, nasal congestion or excess secretions, fever, chills, sweats, unintended weight loss, pleurtic or exertional chest pain, orthopnea PND, or leg swelling Pt denies any increase in rescue therapy over baseline, denies waking up needing it or having any early am or nocturnal exacerbations of coughing/wheezing/or dyspnea. Pt also denies any obvious fluctuation in symptoms with  weather or environmental change or other alleviating or aggravating factors  Dyspnea is better and is cough less d/t pulmicort and PPI use   01/2012 Feels better,  Less dyspnea and cough.  On oxygen at night. Now with double vision.  On pulmicort.   MRI of head is normal.  07/14/2012 10/12 Cleda Daub showed mild peripheral airflow obstruction. Amb pulse ox 10/3: Spo2 >94% ONO on RA 10/5: Desat 82%  21 time/hour., O2 QHS ordered Since last Ov developed more tightness and wheezing.  Pt had cycled off PPI,  Then restarted about one week ago and helps the cough.  No pain in chest area.  Felt tightness in chest.  Pt notes some wheeze.   Dx with MG 02/2012.  Symptoms were diplopia.  Neuro MD: Gomez Cleverly.   Pt has nodule in RUL 3mm on CT 05/2012.  No thymus gland seen.   No real pndrip.  No peripheral motor weakness.   Past Medical History  Diagnosis Date  . Hyperlipidemia   . Hypertension   . Hypothyroidism   . Osteopenia     last dexa 12-20-05  . Osteoarthritis     see's Dr. Charlett Blake  . Bursitis     . Myasthenia gravis 2013    followed at [facility] Dr Jeremy Johann, diplopia only     Family History  Problem Relation Age of Onset  . Breast cancer Maternal Grandmother   . Diabetes    . Hyperlipidemia    . Hypertension    . Kidney cancer Mother   . Cancer Father   . Clotting disorder Brother      History   Social History  . Marital Status: Married    Spouse Name: N/A    Number of Children: N/A  . Years of Education: N/A   Occupational History  . Not on file.   Social History Main Topics  . Smoking status: Never Smoker   . Smokeless tobacco: Never Used  . Alcohol Use: Yes     rare  . Drug Use: No  . Sexually Active: Not on file   Other Topics Concern  . Not on file   Social History Narrative   RetiredMarried     Allergies  Allergen Reactions  . Codeine   . Meperidine Hcl     REACTION: unspecified  . Propoxyphene Hcl     REACTION: unspecified     Outpatient Prescriptions Prior to Visit  Medication Sig Dispense Refill  . aspirin 81 MG tablet Take 81 mg by mouth daily.        Marland Kitchen atorvastatin (LIPITOR) 10  MG tablet Take 1 tablet (10 mg total) by mouth daily.  90 tablet  3  . budesonide (PULMICORT FLEXHALER) 180 MCG/ACT inhaler Inhale 2 puffs into the lungs 2 (two) times daily.  3 each  4  . levothyroxine (SYNTHROID, LEVOTHROID) 100 MCG tablet TAKE 1 TABLET BY MOUTH EVERY DAY  30 tablet  11  . losartan (COZAAR) 50 MG tablet Take 1 tablet (50 mg total) by mouth daily.  90 tablet  3  . metoprolol (TOPROL XL) 50 MG 24 hr tablet Take 1 tablet (50 mg total) by mouth daily.  90 tablet  3  . MULTIPLE VITAMIN PO Take 1 tablet by mouth daily.       Marland Kitchen triamterene-hydrochlorothiazide (DYAZIDE) 37.5-25 MG per capsule Take 1 each (1 capsule total) by mouth daily.  90 capsule  3  . multivitamin-lutein (OCUVITE-LUTEIN) CAPS Take 1 capsule by mouth daily.            Review of Systems  Constitutional: Negative for chills, diaphoresis, activity change, appetite change, fatigue  and unexpected weight change.  HENT: Positive for voice change. Negative for hearing loss, nosebleeds, congestion, facial swelling, sneezing, mouth sores, trouble swallowing, neck stiffness, dental problem, postnasal drip, sinus pressure, tinnitus and ear discharge.   Eyes: Negative for photophobia, discharge, itching and visual disturbance.  Respiratory: Negative for apnea, cough, choking, chest tightness and stridor.   Cardiovascular: Negative for palpitations.  Gastrointestinal: Negative for nausea, constipation, blood in stool and abdominal distention.  Genitourinary: Negative for dysuria, urgency, frequency, hematuria, flank pain, decreased urine volume and difficulty urinating.  Musculoskeletal: Positive for arthralgias. Negative for myalgias, back pain, joint swelling and gait problem.  Skin: Negative for color change and pallor.  Neurological: Positive for dizziness. Negative for tremors, seizures, syncope, speech difficulty, weakness, light-headedness and numbness.  Hematological: Negative for adenopathy. Does not bruise/bleed easily.  Psychiatric/Behavioral: Positive for disturbed wake/sleep cycle. Negative for confusion and agitation. The patient is nervous/anxious.        Objective:   Physical Exam  Filed Vitals:   07/14/12 0959  BP: 130/80  Pulse: 101  Temp: 97.7 F (36.5 C)  TempSrc: Oral  Height: 5\' 6"  (1.676 m)  Weight: 183 lb (83.008 kg)  SpO2: 95%    Gen: Pleasant, well-nourished, in no distress,  normal affect  ENT: No lesions,  mouth clear,  oropharynx clear, no postnasal drip  Neck: No JVD, no TMG, no carotid bruits  Lungs: No use of accessory muscles, no dullness to percussion, distant BS  Cardiovascular: RRR, heart sounds normal, no murmur or gallops, no peripheral edema  Abdomen: soft and NT, no HSM,  BS normal  Musculoskeletal: No deformities, no cyanosis or clubbing  Neuro: alert, non focal  Skin: Warm, no lesions or rashes   10/3  spiro: FeV1 78% FVC 78% Fev1/FVC: 101% Fef 25-75:  75%  CXR 08/08/11 Findings: The patient has a massive hiatal hernia. There is some left basilar atelectasis from hernia. Lungs otherwise clear. Cardiac silhouette is obscured.  IMPRESSION: Massive hiatal hernia with some left basilar atelectasis. Stable compared to prior exam.       Assessment & Plan:   Asthma, extrinsic Moderate persistent asthma d/t microaspiration from GERD and large hiatal hernia.  Stable on PPI and pulmicort inhaler. Pt now with Myasthenia gravis. Only involvement is diplopia  Plan Cont PPI Check Vital capacity 3x weekly with Incentive spirometer, pt already has the spirometer. Note nocturnal hypoxemia on ONO>>cont 2Liters QHS per [facility] Note pt deferred pneumovax and  flu vaccine>>she needs both, will remind PCP   Pulmonary nodule, right Recheck CT Chest in one year  Myasthenia gravis Followed at [facility] by Jeremy Johann Diplopia only.  Prn mestinon.  No pulmonary involvement Plan IS check three times weekly to chk Vital capacity No need for repeat PFTs at this time     Updated Medication List Outpatient Encounter Prescriptions as of 07/14/2012  Medication Sig Dispense Refill  . aspirin 81 MG tablet Take 81 mg by mouth daily.        Marland Kitchen atorvastatin (LIPITOR) 10 MG tablet Take 1 tablet (10 mg total) by mouth daily.  90 tablet  3  . budesonide (PULMICORT FLEXHALER) 180 MCG/ACT inhaler Inhale 2 puffs into the lungs 2 (two) times daily.  3 each  4  . levothyroxine (SYNTHROID, LEVOTHROID) 100 MCG tablet TAKE 1 TABLET BY MOUTH EVERY DAY  30 tablet  11  . losartan (COZAAR) 50 MG tablet Take 1 tablet (50 mg total) by mouth daily.  90 tablet  3  . metoprolol (TOPROL XL) 50 MG 24 hr tablet Take 1 tablet (50 mg total) by mouth daily.  90 tablet  3  . MULTIPLE VITAMIN PO Take 1 tablet by mouth daily.       Marland Kitchen omeprazole (PRILOSEC) 40 MG capsule Take 40 mg by mouth daily.      Marland Kitchen pyridostigmine (MESTINON) 60 MG  tablet Take 60 mg by mouth 3 (three) times daily as needed.      . triamterene-hydrochlorothiazide (DYAZIDE) 37.5-25 MG per capsule Take 1 each (1 capsule total) by mouth daily.  90 capsule  3  . DISCONTD: pyridostigmine (MESTINON) 60 MG tablet Take 1 tablet by mouth Three times daily as needed.      Marland Kitchen DISCONTD: multivitamin-lutein (OCUVITE-LUTEIN) CAPS Take 1 capsule by mouth daily.

## 2012-07-14 NOTE — Assessment & Plan Note (Signed)
Followed at [facility] by Jeremy Johann Diplopia only.  Prn mestinon.  No pulmonary involvement Plan IS check three times weekly to chk Vital capacity No need for repeat PFTs at this time

## 2012-07-14 NOTE — Assessment & Plan Note (Signed)
Recheck CT Chest in one year

## 2012-07-14 NOTE — Patient Instructions (Addendum)
No change in medications,  Stay on omeprazole Check your vital capacity with incentive spirometer about 3 times a week. Call if you drop by 10% or greater consistently You will need a flu vaccine and  Pneumovax this year

## 2012-07-14 NOTE — Assessment & Plan Note (Addendum)
Moderate persistent asthma d/t microaspiration from GERD and large hiatal hernia.  Stable on PPI and pulmicort inhaler. Pt now with Myasthenia gravis. Only involvement is diplopia  Plan Cont PPI Check Vital capacity 3x weekly with Incentive spirometer, pt already has the spirometer.

## 2012-07-16 ENCOUNTER — Telehealth: Payer: Self-pay | Admitting: Critical Care Medicine

## 2012-07-16 DIAGNOSIS — J45909 Unspecified asthma, uncomplicated: Secondary | ICD-10-CM

## 2012-07-16 MED ORDER — BUDESONIDE 180 MCG/ACT IN AEPB: 2.0000 | INHALATION_SPRAY | Freq: Two times a day (BID) | RESPIRATORY_TRACT | Status: DC

## 2012-07-16 NOTE — Telephone Encounter (Signed)
Pulmicort rx signed by Dr. Delford Field and placed in mail to pt's home address.  Pt aware.

## 2012-07-16 NOTE — Telephone Encounter (Signed)
I spoke with pt and she is requesting to have her pulmicort inhaler rx for 6 month supply mailed to her home address. I have confirmed address and have placed RX in Dr. Delford Field look at for him to sign and mail out to her. Will forward to Crystal to document once this has been taking care of.

## 2012-07-23 ENCOUNTER — Encounter: Payer: Self-pay | Admitting: Family Medicine

## 2012-07-23 ENCOUNTER — Ambulatory Visit (INDEPENDENT_AMBULATORY_CARE_PROVIDER_SITE_OTHER): Payer: Medicare Other | Admitting: Family Medicine

## 2012-07-23 VITALS — BP 122/74 | HR 95 | Temp 98.0°F | Ht 65.75 in | Wt 188.0 lb

## 2012-07-23 DIAGNOSIS — I10 Essential (primary) hypertension: Secondary | ICD-10-CM

## 2012-07-23 DIAGNOSIS — E785 Hyperlipidemia, unspecified: Secondary | ICD-10-CM

## 2012-07-23 DIAGNOSIS — E039 Hypothyroidism, unspecified: Secondary | ICD-10-CM

## 2012-07-23 DIAGNOSIS — Z23 Encounter for immunization: Secondary | ICD-10-CM

## 2012-07-23 DIAGNOSIS — G7000 Myasthenia gravis without (acute) exacerbation: Secondary | ICD-10-CM

## 2012-07-23 DIAGNOSIS — J45909 Unspecified asthma, uncomplicated: Secondary | ICD-10-CM

## 2012-07-23 LAB — BASIC METABOLIC PANEL
BUN: 25 mg/dL — ABNORMAL HIGH (ref 6–23)
CO2: 30 mEq/L (ref 19–32)
Chloride: 103 mEq/L (ref 96–112)
Creatinine, Ser: 1.2 mg/dL (ref 0.4–1.2)

## 2012-07-23 LAB — LIPID PANEL
Cholesterol: 175 mg/dL (ref 0–200)
LDL Cholesterol: 94 mg/dL (ref 0–99)
Triglycerides: 131 mg/dL (ref 0.0–149.0)

## 2012-07-23 LAB — CBC WITH DIFFERENTIAL/PLATELET
Basophils Absolute: 0 10*3/uL (ref 0.0–0.1)
Eosinophils Absolute: 0.1 10*3/uL (ref 0.0–0.7)
Lymphocytes Relative: 19.7 % (ref 12.0–46.0)
Monocytes Relative: 5.5 % (ref 3.0–12.0)
Platelets: 174 10*3/uL (ref 150.0–400.0)
RDW: 13.7 % (ref 11.5–14.6)

## 2012-07-23 LAB — POCT URINALYSIS DIPSTICK
Nitrite, UA: NEGATIVE
Protein, UA: NEGATIVE
Urobilinogen, UA: 0.2

## 2012-07-23 LAB — HEPATIC FUNCTION PANEL
ALT: 21 U/L (ref 0–35)
Albumin: 4.1 g/dL (ref 3.5–5.2)
Total Bilirubin: 1.1 mg/dL (ref 0.3–1.2)
Total Protein: 7.2 g/dL (ref 6.0–8.3)

## 2012-07-23 LAB — TSH: TSH: 0.63 u[IU]/mL (ref 0.35–5.50)

## 2012-07-23 MED ORDER — OMEPRAZOLE 40 MG PO CPDR: 40.0000 mg | DELAYED_RELEASE_CAPSULE | Freq: Every day | ORAL | Status: DC

## 2012-07-23 MED ORDER — LEVOTHYROXINE SODIUM 100 MCG PO TABS: 100.0000 ug | ORAL_TABLET | Freq: Every day | ORAL | Status: DC

## 2012-07-23 MED ORDER — ATORVASTATIN CALCIUM 10 MG PO TABS: 10.0000 mg | ORAL_TABLET | Freq: Every day | ORAL | Status: DC

## 2012-07-23 MED ORDER — LOSARTAN POTASSIUM 50 MG PO TABS: 50.0000 mg | ORAL_TABLET | Freq: Every day | ORAL | Status: DC

## 2012-07-23 MED ORDER — TRIAMTERENE-HCTZ 37.5-25 MG PO CAPS: 1.0000 | ORAL_CAPSULE | Freq: Every day | ORAL | Status: DC

## 2012-07-23 MED ORDER — METOPROLOL SUCCINATE ER 50 MG PO TB24: 50.0000 mg | ORAL_TABLET | Freq: Every day | ORAL | Status: DC

## 2012-07-23 NOTE — Addendum Note (Signed)
Addended by: Aniceto Boss A on: 07/23/2012 10:15 AM   Modules accepted: Orders

## 2012-07-23 NOTE — Progress Notes (Signed)
  Subjective:    Patient ID: Christina Jacobs, female    DOB: [birth year], 71 y.o.   MRN: [id number]  HPI 71 yr old female for a cpx. She feels well. She is being treated for myasthenia gravis at [facility] which affects only her extraocular muscles at this point.    Review of Systems  Constitutional: Negative.   HENT: Negative.   Eyes: Negative.   Respiratory: Negative.   Cardiovascular: Negative.   Gastrointestinal: Negative.   Genitourinary: Negative for dysuria, urgency, frequency, hematuria, flank pain, decreased urine volume, enuresis, difficulty urinating, pelvic pain and dyspareunia.  Musculoskeletal: Negative.   Skin: Negative.   Neurological: Negative.   Hematological: Negative.   Psychiatric/Behavioral: Negative.        Objective:   Physical Exam  Constitutional: She is oriented to person, place, and time. She appears well-developed and well-nourished. No distress.  HENT:  Head: Normocephalic and atraumatic.  Right Ear: External ear normal.  Left Ear: External ear normal.  Nose: Nose normal.  Mouth/Throat: Oropharynx is clear and moist. No oropharyngeal exudate.  Eyes: Conjunctivae normal and EOM are normal. Pupils are equal, round, and reactive to light. No scleral icterus.  Neck: Normal range of motion. Neck supple. No JVD present. No thyromegaly present.  Cardiovascular: Normal rate, regular rhythm, normal heart sounds and intact distal pulses.  Exam reveals no gallop and no friction rub.   No murmur heard.      EKG normal  Pulmonary/Chest: Effort normal and breath sounds normal. No respiratory distress. She has no wheezes. She has no rales. She exhibits no tenderness.  Abdominal: Soft. Bowel sounds are normal. She exhibits no distension and no mass. There is no tenderness. There is no rebound and no guarding.  Musculoskeletal: Normal range of motion. She exhibits no edema and no tenderness.  Lymphadenopathy:    She has no cervical adenopathy.  Neurological: She is  alert and oriented to person, place, and time. She has normal reflexes. No cranial nerve deficit. She exhibits normal muscle tone. Coordination normal.  Skin: Skin is warm and dry. No rash noted. No erythema.  Psychiatric: She has a normal mood and affect. Her behavior is normal. Judgment and thought content normal.          Assessment & Plan:  Well exam. Get fasting labs

## 2012-07-24 NOTE — Progress Notes (Signed)
Quick Note:  Called and spoke with pt and pt is aware. Address verified and labs sent to address. ______

## 2012-08-12 ENCOUNTER — Other Ambulatory Visit: Payer: Self-pay | Admitting: Dermatology

## 2013-01-13 ENCOUNTER — Telehealth: Payer: Self-pay | Admitting: Critical Care Medicine

## 2013-01-13 NOTE — Telephone Encounter (Signed)
ATC patient x3. No return call back. Sent letter 01/13/13.  °

## 2013-01-22 ENCOUNTER — Ambulatory Visit (INDEPENDENT_AMBULATORY_CARE_PROVIDER_SITE_OTHER): Payer: Medicare Other | Admitting: Critical Care Medicine

## 2013-01-22 ENCOUNTER — Encounter: Payer: Self-pay | Admitting: Critical Care Medicine

## 2013-01-22 VITALS — BP 120/84 | HR 88 | Temp 97.6°F | Ht 66.0 in | Wt 186.0 lb

## 2013-01-22 DIAGNOSIS — J45909 Unspecified asthma, uncomplicated: Secondary | ICD-10-CM

## 2013-01-22 DIAGNOSIS — R911 Solitary pulmonary nodule: Secondary | ICD-10-CM

## 2013-01-22 MED ORDER — PREDNISONE 10 MG PO TABS: ORAL_TABLET | ORAL | Status: DC

## 2013-01-22 MED ORDER — BUDESONIDE 180 MCG/ACT IN AEPB: 2.0000 | INHALATION_SPRAY | Freq: Two times a day (BID) | RESPIRATORY_TRACT | Status: DC

## 2013-01-22 NOTE — Assessment & Plan Note (Signed)
Chronic persistent asthma on the basis of microaspiration due to reflux disease and large hiatal hernia but typically with nocturnal symptom complex Mild exacerbation currently present Plan Take prednisone 10mg  Take 4 for two days three for two days two for two days one for two days STay on pulmicort two puff twice daily Return 4 months

## 2013-01-22 NOTE — Patient Instructions (Addendum)
Take prednisone 10mg  Take 4 for two days three for two days two for two days one for two days STay on pulmicort two puff twice daily Return 4 months

## 2013-01-22 NOTE — Assessment & Plan Note (Signed)
RUL 3mm nodule with benign characteristics in a never smoker Plan No further CT Chest needed

## 2013-01-22 NOTE — Progress Notes (Signed)
Subjective:    Patient ID: Christina Jacobs, female    DOB: [birth year], 72 y.o.   MRN: [id number]   72 y.o.WF with cyclical cough and moderate persistent asthma  due to microaspiration d/t reflux and large hiatal hernia  HPI Comments: Had surgery 12/11 for hiatal hernia,  Stomach up into the chest with LLL ATX, not able to separate the two and not successful, did staple intestines.    01/22/2013 The patient notes more dyspnea with exertion over the last several months. There is no chest pain. There is some wheezing. No real cough. The patient's myasthenia gravis is stable.  PUL ASTHMA HISTORY 01/22/2013  Symptoms Daily  Nighttime awakenings 3-4/month  Interference with activity Some limitations  SABA use Daily  Exacerbations requiring oral steroids 0-1 / year     Past Medical History  Diagnosis Date  . Hyperlipidemia   . Hypertension   . Hypothyroidism   . Osteopenia     last dexa 12-20-05  . Osteoarthritis     see's Dr. Charlett Blake  . Bursitis   . Myasthenia gravis 2013    followed at [facility] Dr Jeremy Johann, diplopia only  . Asthma     sees Dr. Danise Mina      Family History  Problem Relation Age of Onset  . Breast cancer Maternal Grandmother   . Diabetes    . Hyperlipidemia    . Hypertension    . Kidney cancer Mother   . Cancer Father   . Clotting disorder Brother      History   Social History  . Marital Status: Married    Spouse Name: N/A    Number of Children: N/A  . Years of Education: N/A   Occupational History  . Not on file.   Social History Main Topics  . Smoking status: Never Smoker   . Smokeless tobacco: Never Used  . Alcohol Use: No  . Drug Use: No  . Sexually Active: Not on file   Other Topics Concern  . Not on file   Social History Narrative   Retired   Married           Allergies  Allergen Reactions  . Codeine   . Meperidine Hcl     REACTION: unspecified  . Propoxyphene Hcl     REACTION: unspecified     Outpatient  Prescriptions Prior to Visit  Medication Sig Dispense Refill  . aspirin 81 MG tablet Take 81 mg by mouth daily.        Marland Kitchen atorvastatin (LIPITOR) 10 MG tablet Take 1 tablet (10 mg total) by mouth daily.  90 tablet  3  . levothyroxine (SYNTHROID, LEVOTHROID) 100 MCG tablet Take 1 tablet (100 mcg total) by mouth daily.  90 tablet  3  . losartan (COZAAR) 50 MG tablet Take 1 tablet (50 mg total) by mouth daily.  90 tablet  3  . metoprolol succinate (TOPROL XL) 50 MG 24 hr tablet Take 1 tablet (50 mg total) by mouth daily.  90 tablet  3  . MULTIPLE VITAMIN PO Take 1 tablet by mouth daily.       Marland Kitchen pyridostigmine (MESTINON) 60 MG tablet Take 60 mg by mouth 3 (three) times daily as needed.      . triamterene-hydrochlorothiazide (DYAZIDE) 37.5-25 MG per capsule Take 1 each (1 capsule total) by mouth daily.  90 capsule  3  . budesonide (PULMICORT FLEXHALER) 180 MCG/ACT inhaler Inhale 2 puffs into the lungs 2 (two) times daily.  1 Inhaler  6  . omeprazole (PRILOSEC) 40 MG capsule Take 1 capsule (40 mg total) by mouth daily.  90 capsule  3   No facility-administered medications prior to visit.      Review of Systems  Constitutional: Negative for chills, diaphoresis, activity change, appetite change, fatigue and unexpected weight change.  HENT: Positive for voice change. Negative for hearing loss, nosebleeds, congestion, facial swelling, sneezing, mouth sores, trouble swallowing, neck stiffness, dental problem, postnasal drip, sinus pressure, tinnitus and ear discharge.   Eyes: Negative for photophobia, discharge, itching and visual disturbance.  Respiratory: Negative for apnea, cough, choking, chest tightness and stridor.   Cardiovascular: Negative for palpitations.  Gastrointestinal: Negative for nausea, constipation, blood in stool and abdominal distention.  Genitourinary: Negative for dysuria, urgency, frequency, hematuria, flank pain, decreased urine volume and difficulty urinating.  Musculoskeletal:  Positive for arthralgias. Negative for myalgias, back pain, joint swelling and gait problem.  Skin: Negative for color change and pallor.  Neurological: Positive for dizziness. Negative for tremors, seizures, syncope, speech difficulty, weakness, light-headedness and numbness.  Hematological: Negative for adenopathy. Does not bruise/bleed easily.  Psychiatric/Behavioral: Positive for sleep disturbance. Negative for confusion and agitation. The patient is nervous/anxious.        Objective:   Physical Exam  Filed Vitals:   01/22/13 1435  BP: 120/84  Pulse: 88  Temp: 97.6 F (36.4 C)  TempSrc: Oral  Height: 5\' 6"  (1.676 m)  Weight: 186 lb (84.369 kg)  SpO2: 96%    Gen: Pleasant, well-nourished, in no distress,  normal affect  ENT: No lesions,  mouth clear,  oropharynx clear, no postnasal drip  Neck: No JVD, no TMG, no carotid bruits  Lungs: No use of accessory muscles, no dullness to percussion, distant BS  Cardiovascular: RRR, heart sounds normal, no murmur or gallops, no peripheral edema  Abdomen: soft and NT, no HSM,  BS normal  Musculoskeletal: No deformities, no cyanosis or clubbing  Neuro: alert, non focal  Skin: Warm, no lesions or rashes      Assessment & Plan:   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 level reflux Chronic persistent asthma on the basis of microaspiration due to reflux disease and large hiatal hernia but typically with nocturnal symptom complex Mild exacerbation currently present Plan Take prednisone 10mg  Take 4 for two days three for two days two for two days one for two days STay on pulmicort two puff twice daily Return 4 months   Pulmonary nodule, right RUL 3mm nodule with benign characteristics in a never smoker Plan No further CT Chest needed     Updated Medication List Outpatient Encounter Prescriptions as of 01/22/2013  Medication Sig Dispense Refill  . aspirin 81 MG tablet Take 81 mg by mouth daily.         Marland Kitchen atorvastatin (LIPITOR) 10 MG tablet Take 1 tablet (10 mg total) by mouth daily.  90 tablet  3  . budesonide (PULMICORT FLEXHALER) 180 MCG/ACT inhaler Inhale 2 puffs into the lungs 2 (two) times daily.  3 Inhaler  4  . levothyroxine (SYNTHROID, LEVOTHROID) 100 MCG tablet Take 1 tablet (100 mcg total) by mouth daily.  90 tablet  3  . losartan (COZAAR) 50 MG tablet Take 1 tablet (50 mg total) by mouth daily.  90 tablet  3  . metoprolol succinate (TOPROL XL) 50 MG 24 hr tablet Take 1 tablet (50 mg total) by mouth daily.  90 tablet  3  . MULTIPLE VITAMIN PO Take 1  tablet by mouth daily.       Marland Kitchen pyridostigmine (MESTINON) 60 MG tablet Take 60 mg by mouth 3 (three) times daily as needed.      . tretinoin (RETIN-A) 0.025 % cream Apply topically daily.      Marland Kitchen triamterene-hydrochlorothiazide (DYAZIDE) 37.5-25 MG per capsule Take 1 each (1 capsule total) by mouth daily.  90 capsule  3  . [DISCONTINUED] budesonide (PULMICORT FLEXHALER) 180 MCG/ACT inhaler Inhale 2 puffs into the lungs 2 (two) times daily.  1 Inhaler  6  . predniSONE (DELTASONE) 10 MG tablet Take 4 for two days three for two days two for two days one for two days  20 tablet  0  . [DISCONTINUED] omeprazole (PRILOSEC) 40 MG capsule Take 1 capsule (40 mg total) by mouth daily.  90 capsule  3   No facility-administered encounter medications on file as of 01/22/2013.        Updated Medication List Outpatient Encounter Prescriptions as of 01/22/2013  Medication Sig Dispense Refill  . aspirin 81 MG tablet Take 81 mg by mouth daily.        Marland Kitchen atorvastatin (LIPITOR) 10 MG tablet Take 1 tablet (10 mg total) by mouth daily.  90 tablet  3  . budesonide (PULMICORT FLEXHALER) 180 MCG/ACT inhaler Inhale 2 puffs into the lungs 2 (two) times daily.  3 Inhaler  4  . levothyroxine (SYNTHROID, LEVOTHROID) 100 MCG tablet Take 1 tablet (100 mcg total) by mouth daily.  90 tablet  3  . losartan (COZAAR) 50 MG tablet Take 1 tablet (50 mg total) by mouth daily.   90 tablet  3  . metoprolol succinate (TOPROL XL) 50 MG 24 hr tablet Take 1 tablet (50 mg total) by mouth daily.  90 tablet  3  . MULTIPLE VITAMIN PO Take 1 tablet by mouth daily.       Marland Kitchen pyridostigmine (MESTINON) 60 MG tablet Take 60 mg by mouth 3 (three) times daily as needed.      . tretinoin (RETIN-A) 0.025 % cream Apply topically daily.      Marland Kitchen triamterene-hydrochlorothiazide (DYAZIDE) 37.5-25 MG per capsule Take 1 each (1 capsule total) by mouth daily.  90 capsule  3  . [DISCONTINUED] budesonide (PULMICORT FLEXHALER) 180 MCG/ACT inhaler Inhale 2 puffs into the lungs 2 (two) times daily.  1 Inhaler  6  . predniSONE (DELTASONE) 10 MG tablet Take 4 for two days three for two days two for two days one for two days  20 tablet  0  . [DISCONTINUED] omeprazole (PRILOSEC) 40 MG capsule Take 1 capsule (40 mg total) by mouth daily.  90 capsule  3   No facility-administered encounter medications on file as of 01/22/2013.

## 2013-05-25 ENCOUNTER — Encounter: Payer: Self-pay | Admitting: Critical Care Medicine

## 2013-05-25 ENCOUNTER — Ambulatory Visit (INDEPENDENT_AMBULATORY_CARE_PROVIDER_SITE_OTHER): Payer: Managed Care, Other (non HMO) | Admitting: Critical Care Medicine

## 2013-05-25 VITALS — BP 132/80 | HR 71 | Ht 66.0 in | Wt 183.0 lb

## 2013-05-25 DIAGNOSIS — J4541 Moderate persistent asthma with (acute) exacerbation: Secondary | ICD-10-CM

## 2013-05-25 DIAGNOSIS — J45901 Unspecified asthma with (acute) exacerbation: Secondary | ICD-10-CM

## 2013-05-25 MED ORDER — BUDESONIDE-FORMOTEROL FUMARATE 160-4.5 MCG/ACT IN AERO: 2.0000 | INHALATION_SPRAY | Freq: Two times a day (BID) | RESPIRATORY_TRACT | Status: DC

## 2013-05-25 NOTE — Patient Instructions (Addendum)
You can try pepcid 20mg  or zantac 150mg  at bedtime in place of omeprazole  Stop pulmicort  Start Symbicort two puff twice daily, use sample, call with results. Use printed Rx for refills if it is helping Return 4 months

## 2013-05-25 NOTE — Progress Notes (Signed)
Subjective:    Patient ID: Christina Jacobs, female    DOB: 23-Jun-1941, 72 y.o.   MRN: [id number]   72 y.o.WF with cyclical cough and moderate persistent asthma  due to microaspiration d/t reflux and large hiatal hernia  HPI Comments: Had surgery 12/11 for hiatal hernia,  Stomach up into the chest with LLL ATX, not able to separate the two and not successful, did staple intestines.    01/22/2013 The patient notes more dyspnea with exertion over the last several months. There is no chest pain. There is some wheezing. No real cough. The patient's myasthenia gravis is stable.  PUL ASTHMA HISTORY 01/22/2013  Symptoms Daily  Nighttime awakenings 3-4/month  Interference with activity Some limitations  SABA use Daily  Exacerbations requiring oral steroids 0-1 / year   05/25/2013 Chief Complaint  Patient presents with  . 4 month follow up    Breathing has improved since last OV.  Does still notice SOB after eating and some wheezing.  No chest tightness, chest pain, or cough at this time.  ? If pulmicort helps. No cough now. Pt with MG eye involvement.  Pt with ongoing dyspnea and wheezing noted.    Past Medical History  Diagnosis Date  . Hyperlipidemia   . Hypertension   . Hypothyroidism   . Osteopenia     last dexa 12-20-05  . Osteoarthritis     see's Dr. Charlett Blake  . Bursitis   . Myasthenia gravis 2013    followed at [facility] Dr Jeremy Johann, diplopia only  . Asthma     sees Dr. Danise Mina      Family History  Problem Relation Age of Onset  . Breast cancer Maternal Grandmother   . Diabetes    . Hyperlipidemia    . Hypertension    . Kidney cancer Mother   . Cancer Father   . Clotting disorder Brother      History   Social History  . Marital Status: Married    Spouse Name: N/A    Number of Children: N/A  . Years of Education: N/A   Occupational History  . Not on file.   Social History Main Topics  . Smoking status: Never Smoker   . Smokeless tobacco: Never Used  .  Alcohol Use: No  . Drug Use: No  . Sexually Active: Not on file   Other Topics Concern  . Not on file   Social History Narrative   Retired   Married           Allergies  Allergen Reactions  . Codeine   . Meperidine Hcl     REACTION: unspecified  . Propoxyphene Hcl     REACTION: unspecified     Outpatient Prescriptions Prior to Visit  Medication Sig Dispense Refill  . aspirin 81 MG tablet Take 81 mg by mouth daily.        Marland Kitchen atorvastatin (LIPITOR) 10 MG tablet Take 1 tablet (10 mg total) by mouth daily.  90 tablet  3  . levothyroxine (SYNTHROID, LEVOTHROID) 100 MCG tablet Take 1 tablet (100 mcg total) by mouth daily.  90 tablet  3  . losartan (COZAAR) 50 MG tablet Take 1 tablet (50 mg total) by mouth daily.  90 tablet  3  . metoprolol succinate (TOPROL XL) 50 MG 24 hr tablet Take 1 tablet (50 mg total) by mouth daily.  90 tablet  3  . MULTIPLE VITAMIN PO Take 1 tablet by mouth daily.       Marland Kitchen  pyridostigmine (MESTINON) 60 MG tablet Take 60 mg by mouth 3 (three) times daily as needed.      . tretinoin (RETIN-A) 0.025 % cream Apply topically daily.      Marland Kitchen triamterene-hydrochlorothiazide (DYAZIDE) 37.5-25 MG per capsule Take 1 each (1 capsule total) by mouth daily.  90 capsule  3  . budesonide (PULMICORT FLEXHALER) 180 MCG/ACT inhaler Inhale 2 puffs into the lungs 2 (two) times daily.  3 Inhaler  4  . predniSONE (DELTASONE) 10 MG tablet Take 4 for two days three for two days two for two days one for two days  20 tablet  0   No facility-administered medications prior to visit.      Review of Systems  Constitutional: Negative for chills, diaphoresis, activity change, appetite change, fatigue and unexpected weight change.  HENT: Positive for voice change. Negative for hearing loss, nosebleeds, congestion, facial swelling, sneezing, mouth sores, trouble swallowing, neck stiffness, dental problem, postnasal drip, sinus pressure, tinnitus and ear discharge.   Eyes: Negative for  photophobia, discharge, itching and visual disturbance.  Respiratory: Negative for apnea, cough, choking, chest tightness and stridor.   Cardiovascular: Negative for palpitations.  Gastrointestinal: Negative for nausea, constipation, blood in stool and abdominal distention.  Genitourinary: Negative for dysuria, urgency, frequency, hematuria, flank pain, decreased urine volume and difficulty urinating.  Musculoskeletal: Positive for arthralgias. Negative for myalgias, back pain, joint swelling and gait problem.  Skin: Negative for color change and pallor.  Neurological: Positive for dizziness. Negative for tremors, seizures, syncope, speech difficulty, weakness, light-headedness and numbness.  Hematological: Negative for adenopathy. Does not bruise/bleed easily.  Psychiatric/Behavioral: Positive for sleep disturbance. Negative for confusion and agitation. The patient is nervous/anxious.        Objective:   Physical Exam  Filed Vitals:   05/25/13 0938  BP: 132/80  Pulse: 71  Height: 5\' 6"  (1.676 m)  Weight: 183 lb (83.008 kg)  SpO2: 96%    Gen: Pleasant, well-nourished, in no distress,  normal affect  ENT: No lesions,  mouth clear,  oropharynx clear, no postnasal drip  Neck: No JVD, no TMG, no carotid bruits  Lungs: No use of accessory muscles, no dullness to percussion, distant BS  Cardiovascular: RRR, heart sounds normal, no murmur or gallops, no peripheral edema  Abdomen: soft and NT, no HSM,  BS normal  Musculoskeletal: No deformities, no cyanosis or clubbing  Neuro: alert, non focal  Skin: Warm, no lesions or rashes      Assessment & Plan:   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 level reflux Moderate persistent asthma with high level reflux due to hiatal hernia Plan Start pepcid 20mg  or zantac 150mg  at bedtime in place of omeprazole  Stop pulmicort  Start Symbicort two puff twice daily, use sample, call with results. Use printed Rx for  refills if it is helping Return 4 months     Updated Medication List Outpatient Encounter Prescriptions as of 05/25/2013  Medication Sig Dispense Refill  . aspirin 81 MG tablet Take 81 mg by mouth daily.        Marland Kitchen atorvastatin (LIPITOR) 10 MG tablet Take 1 tablet (10 mg total) by mouth daily.  90 tablet  3  . levothyroxine (SYNTHROID, LEVOTHROID) 100 MCG tablet Take 1 tablet (100 mcg total) by mouth daily.  90 tablet  3  . losartan (COZAAR) 50 MG tablet Take 1 tablet (50 mg total) by mouth daily.  90 tablet  3  . metoprolol succinate (TOPROL XL) 50 MG  24 hr tablet Take 1 tablet (50 mg total) by mouth daily.  90 tablet  3  . MULTIPLE VITAMIN PO Take 1 tablet by mouth daily.       Marland Kitchen omeprazole (PRILOSEC) 40 MG capsule Take 40 mg by mouth daily.      Marland Kitchen pyridostigmine (MESTINON) 60 MG tablet Take 60 mg by mouth 3 (three) times daily as needed.      . tretinoin (RETIN-A) 0.025 % cream Apply topically daily.      Marland Kitchen triamterene-hydrochlorothiazide (DYAZIDE) 37.5-25 MG per capsule Take 1 each (1 capsule total) by mouth daily.  90 capsule  3  . [DISCONTINUED] budesonide (PULMICORT FLEXHALER) 180 MCG/ACT inhaler Inhale 2 puffs into the lungs 2 (two) times daily.  3 Inhaler  4  . budesonide-formoterol (SYMBICORT) 160-4.5 MCG/ACT inhaler Inhale 2 puffs into the lungs 2 (two) times daily.  1 Inhaler  11  . [DISCONTINUED] budesonide-formoterol (SYMBICORT) 160-4.5 MCG/ACT inhaler Inhale 2 puffs into the lungs 2 (two) times daily.  1 Inhaler  0  . [DISCONTINUED] predniSONE (DELTASONE) 10 MG tablet Take 4 for two days three for two days two for two days one for two days  20 tablet  0   No facility-administered encounter medications on file as of 05/25/2013.        Updated Medication List Outpatient Encounter Prescriptions as of 05/25/2013  Medication Sig Dispense Refill  . aspirin 81 MG tablet Take 81 mg by mouth daily.        Marland Kitchen atorvastatin (LIPITOR) 10 MG tablet Take 1 tablet (10 mg total) by mouth  daily.  90 tablet  3  . levothyroxine (SYNTHROID, LEVOTHROID) 100 MCG tablet Take 1 tablet (100 mcg total) by mouth daily.  90 tablet  3  . losartan (COZAAR) 50 MG tablet Take 1 tablet (50 mg total) by mouth daily.  90 tablet  3  . metoprolol succinate (TOPROL XL) 50 MG 24 hr tablet Take 1 tablet (50 mg total) by mouth daily.  90 tablet  3  . MULTIPLE VITAMIN PO Take 1 tablet by mouth daily.       Marland Kitchen omeprazole (PRILOSEC) 40 MG capsule Take 40 mg by mouth daily.      Marland Kitchen pyridostigmine (MESTINON) 60 MG tablet Take 60 mg by mouth 3 (three) times daily as needed.      . tretinoin (RETIN-A) 0.025 % cream Apply topically daily.      Marland Kitchen triamterene-hydrochlorothiazide (DYAZIDE) 37.5-25 MG per capsule Take 1 each (1 capsule total) by mouth daily.  90 capsule  3  . [DISCONTINUED] budesonide (PULMICORT FLEXHALER) 180 MCG/ACT inhaler Inhale 2 puffs into the lungs 2 (two) times daily.  3 Inhaler  4  . budesonide-formoterol (SYMBICORT) 160-4.5 MCG/ACT inhaler Inhale 2 puffs into the lungs 2 (two) times daily.  1 Inhaler  11  . [DISCONTINUED] budesonide-formoterol (SYMBICORT) 160-4.5 MCG/ACT inhaler Inhale 2 puffs into the lungs 2 (two) times daily.  1 Inhaler  0  . [DISCONTINUED] predniSONE (DELTASONE) 10 MG tablet Take 4 for two days three for two days two for two days one for two days  20 tablet  0   No facility-administered encounter medications on file as of 05/25/2013.

## 2013-05-25 NOTE — Assessment & Plan Note (Signed)
Moderate persistent asthma with high level reflux due to hiatal hernia Plan Start pepcid 20mg  or zantac 150mg  at bedtime in place of omeprazole  Stop pulmicort  Start Symbicort two puff twice daily, use sample, call with results. Use printed Rx for refills if it is helping Return 4 months

## 2013-05-26 ENCOUNTER — Encounter: Payer: Self-pay | Admitting: Critical Care Medicine

## 2013-05-26 NOTE — Telephone Encounter (Signed)
Dr. Delford Field:  In reading the Medication Guide on the Symbicort 80/4.5 it says to discuss with your Doctor if you have thyroid problems, osteoporosis, cataracts or immune system problems. I have all of these symptoms along with controlled blood pressure. Should I continue to use the Symbicort you have given me?  Thank you.  Please advise.Carron Curie, CMA

## 2013-06-19 NOTE — Telephone Encounter (Signed)
ATC patient-- phone rang >10 times. No option to leave VM Will send patient msg on My Chart informing her of this

## 2013-07-27 ENCOUNTER — Encounter: Payer: Self-pay | Admitting: Family Medicine

## 2013-07-27 ENCOUNTER — Ambulatory Visit (INDEPENDENT_AMBULATORY_CARE_PROVIDER_SITE_OTHER): Payer: Medicare HMO | Admitting: Family Medicine

## 2013-07-27 VITALS — BP 120/80 | HR 100 | Temp 98.2°F | Ht 65.5 in | Wt 180.0 lb

## 2013-07-27 DIAGNOSIS — Z0000 Encounter for general adult medical examination without abnormal findings: Secondary | ICD-10-CM

## 2013-07-27 DIAGNOSIS — R9389 Abnormal findings on diagnostic imaging of other specified body structures: Secondary | ICD-10-CM

## 2013-07-27 DIAGNOSIS — N289 Disorder of kidney and ureter, unspecified: Secondary | ICD-10-CM

## 2013-07-27 LAB — HEPATIC FUNCTION PANEL
ALT: 30 U/L (ref 0–35)
Albumin: 3.4 g/dL — ABNORMAL LOW (ref 3.5–5.2)
Total Bilirubin: 1.4 mg/dL — ABNORMAL HIGH (ref 0.3–1.2)

## 2013-07-27 LAB — POCT URINALYSIS DIPSTICK
Ketones, UA: NEGATIVE
Nitrite, UA: POSITIVE
Urobilinogen, UA: 0.2

## 2013-07-27 LAB — BASIC METABOLIC PANEL
BUN: 24 mg/dL — ABNORMAL HIGH (ref 6–23)
Chloride: 103 mEq/L (ref 96–112)
Creatinine, Ser: 1.8 mg/dL — ABNORMAL HIGH (ref 0.4–1.2)
Glucose, Bld: 119 mg/dL — ABNORMAL HIGH (ref 70–99)

## 2013-07-27 LAB — LIPID PANEL
Cholesterol: 217 mg/dL — ABNORMAL HIGH (ref 0–200)
HDL: 53.4 mg/dL (ref >39.00)
VLDL: 37.2 mg/dL (ref 0.0–40.0)

## 2013-07-27 LAB — CBC WITH DIFFERENTIAL/PLATELET
Basophils Absolute: 0 10*3/uL (ref 0.0–0.1)
Eosinophils Absolute: 0 10*3/uL (ref 0.0–0.7)
Hemoglobin: 13.8 g/dL (ref 12.0–15.0)
Lymphocytes Relative: 15.8 % (ref 12.0–46.0)
MCHC: 33.8 g/dL (ref 30.0–36.0)
Monocytes Relative: 4.1 % (ref 3.0–12.0)
Neutrophils Relative %: 79.8 % — ABNORMAL HIGH (ref 43.0–77.0)
Platelets: 121 10*3/uL — ABNORMAL LOW (ref 150.0–400.0)
RDW: 16 % — ABNORMAL HIGH (ref 11.5–14.6)

## 2013-07-27 LAB — TSH: TSH: 0.88 u[IU]/mL (ref 0.35–5.50)

## 2013-07-27 MED ORDER — TRIAMTERENE-HCTZ 37.5-25 MG PO CAPS: 1.0000 | ORAL_CAPSULE | Freq: Every day | ORAL | Status: DC

## 2013-07-27 MED ORDER — ATORVASTATIN CALCIUM 10 MG PO TABS: 10.0000 mg | ORAL_TABLET | Freq: Every day | ORAL | Status: DC

## 2013-07-27 MED ORDER — LEVOFLOXACIN 500 MG PO TABS: 500.0000 mg | ORAL_TABLET | Freq: Every day | ORAL | Status: AC

## 2013-07-27 MED ORDER — OMEPRAZOLE 40 MG PO CPDR: 40.0000 mg | DELAYED_RELEASE_CAPSULE | Freq: Every day | ORAL | Status: DC

## 2013-07-27 MED ORDER — LEVOTHYROXINE SODIUM 100 MCG PO TABS: 100.0000 ug | ORAL_TABLET | Freq: Every day | ORAL | Status: DC

## 2013-07-27 MED ORDER — LOSARTAN POTASSIUM 50 MG PO TABS: 50.0000 mg | ORAL_TABLET | Freq: Every day | ORAL | Status: DC

## 2013-07-27 MED ORDER — METOPROLOL SUCCINATE ER 50 MG PO TB24: 50.0000 mg | ORAL_TABLET | Freq: Every day | ORAL | Status: DC

## 2013-07-27 NOTE — Progress Notes (Signed)
  Subjective:    Patient ID: Christina Jacobs, female    DOB: 04/11/1941, 72 y.o.   MRN: [id number]  HPI 72 yr old female for a cpx. She feels well except for her asthma which has been acting up this past year. She sees Dr. Delford Field for this, and she has been on Prednisone daily all summer. Currently she is taking 40 mg every other day. She still gets SOB with minimal exertion. She will be travelling to [place], [place] this fall to visit her children.    Review of Systems  Constitutional: Negative.   HENT: Negative.   Eyes: Negative.   Respiratory: Positive for cough, shortness of breath and wheezing. Negative for apnea, choking and stridor.   Cardiovascular: Negative.   Gastrointestinal: Negative.   Genitourinary: Negative for dysuria, urgency, frequency, hematuria, flank pain, decreased urine volume, enuresis, difficulty urinating, pelvic pain and dyspareunia.  Musculoskeletal: Negative.   Skin: Negative.   Neurological: Negative.   Psychiatric/Behavioral: Negative.        Objective:   Physical Exam  Constitutional: She is oriented to person, place, and time. She appears well-developed and well-nourished. No distress.  HENT:  Head: Normocephalic and atraumatic.  Right Ear: External ear normal.  Left Ear: External ear normal.  Nose: Nose normal.  Mouth/Throat: Oropharynx is clear and moist. No oropharyngeal exudate.  Eyes: Conjunctivae and EOM are normal. Pupils are equal, round, and reactive to light. No scleral icterus.  Neck: Normal range of motion. Neck supple. No JVD present. No thyromegaly present.  Cardiovascular: Normal rate, regular rhythm, normal heart sounds and intact distal pulses.  Exam reveals no gallop and no friction rub.   No murmur heard. EKG normal with an occasional PAC   Pulmonary/Chest: Effort normal and breath sounds normal. No respiratory distress. She has no wheezes. She has no rales. She exhibits no tenderness.  Abdominal: Soft. Bowel sounds are normal. She  exhibits no distension and no mass. There is no tenderness. There is no rebound and no guarding.  Musculoskeletal: Normal range of motion. She exhibits no edema and no tenderness.  Lymphadenopathy:    She has no cervical adenopathy.  Neurological: She is alert and oriented to person, place, and time. She has normal reflexes. No cranial nerve deficit. She exhibits normal muscle tone. Coordination normal.  Skin: Skin is warm and dry. No rash noted. No erythema.  Psychiatric: She has a normal mood and affect. Her behavior is normal. Judgment and thought content normal.          Assessment & Plan:  Well exam. Get fasting labs. She will return soon for a high dose influenza vaccine. Set up a chest C to follow up a lesion seen in the right upper lobe one year ago

## 2013-07-27 NOTE — Addendum Note (Signed)
Addended by: Bonnye Fava on: 07/27/2013 01:10 PM   Modules accepted: Orders

## 2013-07-29 LAB — URINE CULTURE: Colony Count: >=100000

## 2013-07-31 ENCOUNTER — Other Ambulatory Visit: Payer: Medicare HMO

## 2013-07-31 NOTE — Addendum Note (Signed)
Addended by: Gershon Crane A on: 07/31/2013 01:25 PM   Modules accepted: Orders

## 2013-07-31 NOTE — Progress Notes (Signed)
Quick Note:  I spoke with pt and released results in my chart ______ 

## 2013-08-07 ENCOUNTER — Ambulatory Visit (INDEPENDENT_AMBULATORY_CARE_PROVIDER_SITE_OTHER)
Admission: RE | Admit: 2013-08-07 | Discharge: 2013-08-07 | Disposition: A | Discharge status: Home / Self Care | Payer: Medicare HMO | Source: Ambulatory Visit | Attending: Family Medicine | Admitting: Family Medicine

## 2013-08-07 ENCOUNTER — Telehealth: Payer: Self-pay

## 2013-08-07 DIAGNOSIS — R9389 Abnormal findings on diagnostic imaging of other specified body structures: Secondary | ICD-10-CM

## 2013-08-07 NOTE — Telephone Encounter (Signed)
Called and advised pt that high dose flu is in and pt can schedule.

## 2013-08-10 NOTE — Progress Notes (Signed)
Quick Note:  I spoke with pt and released results in my chart ______ 

## 2013-08-14 ENCOUNTER — Ambulatory Visit (INDEPENDENT_AMBULATORY_CARE_PROVIDER_SITE_OTHER): Payer: Medicare HMO

## 2013-08-14 DIAGNOSIS — Z23 Encounter for immunization: Secondary | ICD-10-CM

## 2013-08-23 ENCOUNTER — Encounter: Payer: Self-pay | Admitting: Family Medicine

## 2013-09-14 ENCOUNTER — Telehealth: Payer: Self-pay | Admitting: Family Medicine

## 2013-09-14 NOTE — Telephone Encounter (Signed)
Patient Information:  Caller Name: Tabbetha  Phone: [phone]  Patient: Kaliyan, Osbourn  Gender: Female  DOB: [birth year]  Age: 72 Years  PCP: Gershon Crane [facility])  Office Follow Up:  Does the office need to follow up with this patient?: No  Instructions For The Office: N/A  RN Note:  Protocol used: Blood Pressure control Problems. Disp: see provider in 2-3 days due to questions concerning treatment plan. Has appointment scheduled for 11-12. Call back parameters given.  Symptoms  Reason For Call & Symptoms: Has been checking blood pressure oveer week end and has been 150/90's. Takes Prednisone daily and was told to watch pressure.  Reviewed Health History In EMR: Yes  Reviewed Medications In EMR: Yes  Reviewed Allergies In EMR: Yes  Reviewed Surgeries / Procedures: Yes  Date of Onset of Symptoms: 09/12/2013  Guideline(s) Used:  No Protocol Available - Sick Adult  Disposition Per Guideline:   See Within 3 Days in Office  Reason For Disposition Reached:   Nursing judgment  Advice Given:  N/A  Patient Refused Recommendation:  Patient Will Follow Up With Office Later  Has already scheduled appointment.

## 2013-09-14 NOTE — Telephone Encounter (Signed)
Noted  

## 2013-09-16 ENCOUNTER — Encounter: Payer: Self-pay | Admitting: Family Medicine

## 2013-09-16 ENCOUNTER — Ambulatory Visit (INDEPENDENT_AMBULATORY_CARE_PROVIDER_SITE_OTHER): Payer: Medicare HMO | Admitting: Family Medicine

## 2013-09-16 VITALS — BP 156/70 | HR 96 | Temp 98.0°F | Wt 184.0 lb

## 2013-09-16 DIAGNOSIS — R002 Palpitations: Secondary | ICD-10-CM

## 2013-09-16 DIAGNOSIS — N289 Disorder of kidney and ureter, unspecified: Secondary | ICD-10-CM

## 2013-09-16 LAB — BASIC METABOLIC PANEL
BUN: 21 mg/dL (ref 6–23)
CO2: 30 mEq/L (ref 19–32)
Calcium: 9.8 mg/dL (ref 8.4–10.5)
Chloride: 99 mEq/L (ref 96–112)
Creatinine, Ser: 1.3 mg/dL — ABNORMAL HIGH (ref 0.4–1.2)
GFR: 44.31 mL/min — ABNORMAL LOW (ref >60.00)
Potassium: 3.7 mEq/L (ref 3.5–5.1)
Sodium: 138 mEq/L (ref 135–145)

## 2013-09-16 NOTE — Progress Notes (Signed)
Pre visit review using our clinic review tool, if applicable. No additional management support is needed unless otherwise documented below in the visit note. 

## 2013-09-16 NOTE — Progress Notes (Signed)
  Subjective:    Patient ID: Christina Jacobs, female    DOB: 25-Mar-1941, 72 y.o.   MRN: [id number]  HPI Here for episodes of heart flutters and SOB. These are becoming a bit more frequent. No chest pains. She describes spells where her heart races and other spells where it has pauses or skips. During her cpx here in September her EKG showed a primarily sinus rhythm with clear P waves but she has a lot of ectopy and even shorts runs of flutter waves.    Review of Systems  Constitutional: Negative.   Respiratory: Positive for shortness of breath. Negative for cough, choking and wheezing.   Cardiovascular: Positive for palpitations. Negative for chest pain and leg swelling.       Objective:   Physical Exam  Constitutional: She appears well-developed and well-nourished. No distress.  Cardiovascular: Normal rate, regular rhythm, normal heart sounds and intact distal pulses.  Exam reveals no gallop and no friction rub.   No murmur heard. Pulmonary/Chest: Effort normal and breath sounds normal.          Assessment & Plan:  Today she is in regular rhythm but it sounds like she may be having paroxysmal atrial fibrillations. We will refer to Cardiology.

## 2013-09-23 ENCOUNTER — Ambulatory Visit (INDEPENDENT_AMBULATORY_CARE_PROVIDER_SITE_OTHER): Payer: Medicare HMO | Admitting: Cardiology

## 2013-09-23 ENCOUNTER — Encounter: Payer: Self-pay | Admitting: Cardiology

## 2013-09-23 VITALS — BP 132/88 | HR 108 | Ht 66.0 in | Wt 184.6 lb

## 2013-09-23 DIAGNOSIS — R0600 Dyspnea, unspecified: Secondary | ICD-10-CM

## 2013-09-23 DIAGNOSIS — R0609 Other forms of dyspnea: Secondary | ICD-10-CM

## 2013-09-23 DIAGNOSIS — R002 Palpitations: Secondary | ICD-10-CM

## 2013-09-23 NOTE — Patient Instructions (Signed)
The current medical regimen is effective;  continue present plan and medications.  Your physician has recommended that you wear an event monitor. Event monitors are medical devices that record the heart's electrical activity. Doctors most often us these monitors to diagnose arrhythmias. Arrhythmias are problems with the speed or rhythm of the heartbeat. The monitor is a small, portable device. You can wear one while you do your normal daily activities. This is usually used to diagnose what is causing palpitations/syncope (passing out).  Further follow up will be based on these results. 

## 2013-09-23 NOTE — Progress Notes (Signed)
HPI   The patient presents for evaluation ofshortness of breath and palpitations.  She has no history previously of coronary disease or dysrhythmias.  She has been treated with prednisone for myasthenia gravis recently. She associates some of her symptoms with this. She describes some episodes of palpitations with a rapid heart rate not necessarily irregular. However, she had some severe shortness of breath recently while walking from a department store. She became somewhat acutely dyspneic and it took a while after she stopped her symptoms to recover. She thinks this happens on a day she takes the prednisone and other day she can feel quite well. When this didn't happen she wasn't having any chest pressure or neck discomfort. She doesn't describe any resting PND or orthopnea. She hasn't had any presyncope or syncope. Her heart raced with this event however.  Allergies  Allergen Reactions  . Codeine Hives  . Propoxyphene Hcl Hives  . Meperidine Hcl Rash    Current Outpatient Prescriptions  Medication Sig Dispense Refill  . atorvastatin (LIPITOR) 10 MG tablet Take 1 tablet (10 mg total) by mouth daily.  90 tablet  3  . calcium citrate-vitamin D (CITRACAL+D) 315-200 MG-UNIT per tablet Take 1 tablet by mouth daily.      Marland Kitchen levothyroxine (SYNTHROID, LEVOTHROID) 100 MCG tablet Take 1 tablet (100 mcg total) by mouth daily.  90 tablet  3  . losartan (COZAAR) 50 MG tablet Take 1 tablet (50 mg total) by mouth daily.  90 tablet  3  . metoprolol succinate (TOPROL XL) 50 MG 24 hr tablet Take 1 tablet (50 mg total) by mouth daily.  90 tablet  3  . MULTIPLE VITAMIN PO Take 1 tablet by mouth daily.       . NON FORMULARY Home oxygen 2 L      . omeprazole (PRILOSEC) 40 MG capsule Take 1 capsule (40 mg total) by mouth daily.  90 capsule  3  . predniSONE (DELTASONE) 20 MG tablet Take 40 mg by mouth every other day.      . tretinoin (RETIN-A) 0.025 % cream Apply topically daily.      Marland Kitchen  triamterene-hydrochlorothiazide (DYAZIDE) 37.5-25 MG per capsule Take 1 each (1 capsule total) by mouth daily.  90 capsule  3   No current facility-administered medications for this visit.    Past Medical History  Diagnosis Date  . Hyperlipidemia   . Hypertension   . Hypothyroidism   . Osteoporosis     last DEXA 08-03-11  . Osteoarthritis     see's Dr. Charlett Blake  . Bursitis   . Myasthenia gravis 2013    followed at [facility] Dr Jeremy Johann, diplopia only  . Asthma     sees Dr. Danise Mina     Past Surgical History  Procedure Laterality Date  . Cardiac stress test  02-24-07    norma  . Appendectomy    . Abdominal hysterectomy    . Tonsillectomy    . Breast surgery      benign breast lumps removed  . Thyroidectomy, partial    . Colonscopy   09-27-10    per Dr. Jarold Motto,  repeat in 5  years   . Hernia repair  10-18-10    Dr. Daphine Deutscher attempted but could reduce this, the entire stomach is above the diaphragm    Family History  Problem Relation Age of Onset  . Breast cancer Maternal Grandmother   . Diabetes    . Hyperlipidemia    . Hypertension    .  Kidney cancer Mother   . Cancer Father   . Clotting disorder Brother     History   Social History  . Marital Status: Married    Spouse Name: N/A    Number of Children: N/A  . Years of Education: N/A   Occupational History  . Not on file.   Social History Main Topics  . Smoking status: Never Smoker   . Smokeless tobacco: Never Used  . Alcohol Use: No  . Drug Use: No  . Sexual Activity: Not on file   Other Topics Concern  . Not on file   Social History Narrative   Retired   Married          ROS:  Positive for reflux, mild renal insufficiency, nocturia, leg cramping, feet swelling.  Otherwise as stated in the HPI and negative for all other systems.   PHYSICAL EXAM BP 132/88  Pulse 108  Ht 5\' 6"  (1.676 m)  Wt 184 lb 9.6 oz (83.734 kg)  BMI 29.81 kg/m2 GENERAL:  Well appearing HEENT:  Pupils equal round  and reactive, fundi not visualized, oral mucosa unremarkable NECK:  No jugular venous distention, waveform within normal limits, carotid upstroke brisk and symmetric, no bruits, no thyromegaly LYMPHATICS:  No cervical, inguinal adenopathy LUNGS:  Clear to auscultation bilaterally BACK:  No CVA tenderness CHEST:  Unremarkable HEART:  PMI not displaced or sustained,S1 and S2 within normal limits, no S3, no S4, no clicks, no rubs, no murmurs ABD:  Flat, positive bowel sounds normal in frequency in pitch, no bruits, no rebound, no guarding, no midline pulsatile mass, no hepatomegaly, no splenomegaly EXT:  2 plus pulses throughout, no edema, no cyanosis no clubbing SKIN:  No rashes no nodules NEURO:  Cranial nerves II through XII grossly intact, motor grossly intact throughout PSYCH:  Cognitively intact, oriented to person place and time   EKG:  Sinus rhythm, rate 95, axis within normal limits, intervals within normal limits, premature ectopic complex.  Nonspecific ST T wave changes.  09/29/2013  ASSESSMENT AND PLAN  DYSPNEA:  The etiology of this is not clear.  She is thought to have some microaspiration causing asthma.   She does describe tachycardia palpitationsand this needs to be further evaluated with an event monitor. I would consider further testing these results of this were recurrent symptoms. I think this is very atypical for any evidence of ischemia.  PALPITATIONS:  This will be evaluated as above.    HTN:  The blood pressure is at target. No change in medications is indicated. We will continue with therapeutic lifestyle changes (TLC).

## 2013-09-25 ENCOUNTER — Encounter: Payer: Self-pay | Admitting: Cardiology

## 2013-09-28 ENCOUNTER — Encounter: Payer: Self-pay | Admitting: Critical Care Medicine

## 2013-09-28 ENCOUNTER — Ambulatory Visit (INDEPENDENT_AMBULATORY_CARE_PROVIDER_SITE_OTHER): Payer: Medicare HMO | Admitting: *Deleted

## 2013-09-28 ENCOUNTER — Ambulatory Visit (INDEPENDENT_AMBULATORY_CARE_PROVIDER_SITE_OTHER): Payer: Medicare HMO | Admitting: Critical Care Medicine

## 2013-09-28 VITALS — BP 156/104 | HR 105 | Temp 97.9°F | Ht 66.0 in | Wt 183.0 lb

## 2013-09-28 DIAGNOSIS — R002 Palpitations: Secondary | ICD-10-CM

## 2013-09-28 DIAGNOSIS — R911 Solitary pulmonary nodule: Secondary | ICD-10-CM

## 2013-09-28 DIAGNOSIS — J45909 Unspecified asthma, uncomplicated: Secondary | ICD-10-CM

## 2013-09-28 DIAGNOSIS — Z23 Encounter for immunization: Secondary | ICD-10-CM

## 2013-09-28 MED ORDER — BUDESONIDE 180 MCG/ACT IN AEPB: 2.0000 | INHALATION_SPRAY | Freq: Two times a day (BID) | RESPIRATORY_TRACT | Status: DC

## 2013-09-28 NOTE — Progress Notes (Signed)
Subjective:    Patient ID: Christina Jacobs, female    DOB: [birth year], 72 y.o.   MRN: [id number]   72 y.o.WF with cyclical cough and moderate persistent asthma  due to microaspiration d/t reflux and large hiatal hernia  HPI 09/28/2013 Chief Complaint  Patient presents with  . 4 month follow up    Breathing has gradually worsened since August - has increased DOE (seems to notice this more on days she takes prednisone) and some chest tightness.  Not much wheezing or cough.  At last ov we started symbicort.  Pt now on prednisone 30mg /d per [facility].  For MG.  Now on pred 30mg  qod.  Pt tried symbicort and had no results.  Pt stayed on pulmicort.   Past Medical History  Diagnosis Date  . Hyperlipidemia   . Hypertension   . Hypothyroidism   . Osteoporosis     last DEXA 08-03-11  . Osteoarthritis     see's Dr. Charlett Blake  . Bursitis   . Myasthenia gravis 2013    followed at [facility] Dr Jeremy Johann, diplopia only  . Asthma     sees Dr. Danise Mina      Family History  Problem Relation Age of Onset  . Breast cancer Maternal Grandmother   . Diabetes    . Hyperlipidemia    . Hypertension    . Kidney cancer Mother   . Cancer Father   . Clotting disorder Brother      History   Social History  . Marital Status: Married    Spouse Name: N/A    Number of Children: 3  . Years of Education: N/A   Occupational History  .     Social History Main Topics  . Smoking status: Never Smoker   . Smokeless tobacco: Never Used  . Alcohol Use: No  . Drug Use: No  . Sexual Activity: Not on file   Other Topics Concern  . Not on file   Social History Narrative   Retired   Married           Allergies  Allergen Reactions  . Codeine Hives  . Propoxyphene Hcl Hives  . Meperidine Hcl Rash     Outpatient Prescriptions Prior to Visit  Medication Sig Dispense Refill  . atorvastatin (LIPITOR) 10 MG tablet Take 1 tablet (10 mg total) by mouth daily.  90 tablet  3  . levothyroxine  (SYNTHROID, LEVOTHROID) 100 MCG tablet Take 1 tablet (100 mcg total) by mouth daily.  90 tablet  3  . losartan (COZAAR) 50 MG tablet Take 1 tablet (50 mg total) by mouth daily.  90 tablet  3  . metoprolol succinate (TOPROL XL) 50 MG 24 hr tablet Take 1 tablet (50 mg total) by mouth daily.  90 tablet  3  . NON FORMULARY Home oxygen 2 L at bedtime      . omeprazole (PRILOSEC) 40 MG capsule Take 1 capsule (40 mg total) by mouth daily.  90 capsule  3  . predniSONE (DELTASONE) 20 MG tablet Take 30 mg by mouth every other day.       . tretinoin (RETIN-A) 0.025 % cream Apply topically daily.      Marland Kitchen triamterene-hydrochlorothiazide (DYAZIDE) 37.5-25 MG per capsule Take 1 each (1 capsule total) by mouth daily.  90 capsule  3  . calcium citrate-vitamin D (CITRACAL+D) 315-200 MG-UNIT per tablet Take 1 tablet by mouth daily.      . MULTIPLE VITAMIN PO Take 1 tablet  by mouth daily.        No facility-administered medications prior to visit.      Review of Systems  Constitutional: Negative for chills, diaphoresis, activity change, appetite change, fatigue and unexpected weight change.  HENT: Positive for voice change. Negative for congestion, dental problem, ear discharge, facial swelling, hearing loss, mouth sores, nosebleeds, postnasal drip, sinus pressure, sneezing, tinnitus and trouble swallowing.   Eyes: Negative for photophobia, discharge, itching and visual disturbance.  Respiratory: Negative for apnea, cough, choking, chest tightness and stridor.   Cardiovascular: Negative for palpitations.  Gastrointestinal: Negative for nausea, constipation, blood in stool and abdominal distention.  Genitourinary: Negative for dysuria, urgency, frequency, hematuria, flank pain, decreased urine volume and difficulty urinating.  Musculoskeletal: Positive for arthralgias. Negative for back pain, gait problem, joint swelling, myalgias and neck stiffness.  Skin: Negative for color change and pallor.  Neurological:  Positive for dizziness. Negative for tremors, seizures, syncope, speech difficulty, weakness, light-headedness and numbness.  Hematological: Negative for adenopathy. Does not bruise/bleed easily.  Psychiatric/Behavioral: Positive for sleep disturbance. Negative for confusion and agitation. The patient is nervous/anxious.        Objective:   Physical Exam  Filed Vitals:   09/28/13 1416  BP: 156/104  Pulse: 105  Temp: 97.9 F (36.6 C)  TempSrc: Oral  Height: 5\' 6"  (1.676 m)  Weight: 183 lb (83.008 kg)  SpO2: 93%    Gen: Pleasant, well-nourished, in no distress,  normal affect  ENT: No lesions,  mouth clear,  oropharynx clear, no postnasal drip  Neck: No JVD, no TMG, no carotid bruits  Lungs: No use of accessory muscles, no dullness to percussion, distant BS  Cardiovascular: RRR, heart sounds normal, no murmur or gallops, no peripheral edema  Abdomen: soft and NT, no HSM,  BS normal  Musculoskeletal: No deformities, no cyanosis or clubbing  Neuro: alert, non focal  Skin: Warm, no lesions or rashes      Assessment & Plan:   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 level reflux Moderate persistent asthma was microaspiration from high level reflux stable at this time 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 aspirin 81 MG tablet Take 81 mg by mouth daily.  Marland Kitchen atorvastatin (LIPITOR) 10 MG tablet Take 1 tablet (10 mg total) by mouth daily.  . budesonide (PULMICORT) 180 MCG/ACT inhaler Inhale 2 puffs into the lungs 2 (two) times daily.  . calcium-vitamin D (CALCIUM 500+D HIGH POTENCY) 500-400  MG-UNIT per tablet Take 1 tablet by mouth daily.  Marland Kitchen levothyroxine (SYNTHROID, LEVOTHROID) 100 MCG tablet Take 1 tablet (100 mcg total) by mouth daily.  Marland Kitchen losartan (COZAAR) 50 MG tablet Take 1 tablet (50 mg total) by mouth daily.  . metoprolol succinate (TOPROL XL) 50 MG 24 hr tablet Take 1 tablet (50 mg total) by mouth daily.  . NON FORMULARY Home oxygen 2 L at bedtime  . omeprazole (PRILOSEC) 40 MG capsule Take 1 capsule (40 mg total) by mouth daily.  . predniSONE (DELTASONE) 20 MG tablet Take 30 mg by mouth every other day.   . tretinoin (RETIN-A) 0.025 % cream Apply topically daily.  Marland Kitchen triamterene-hydrochlorothiazide (DYAZIDE) 37.5-25 MG per capsule Take 1 each (  1 capsule total) by mouth daily.  . [DISCONTINUED] budesonide (PULMICORT) 180 MCG/ACT inhaler Inhale 2 puffs into the lungs 2 (two) times daily.  . [DISCONTINUED] calcium citrate-vitamin D (CITRACAL+D) 315-200 MG-UNIT per tablet Take 1 tablet by mouth daily.  . [DISCONTINUED] MULTIPLE VITAMIN PO Take 1 tablet by mouth daily.   . multivitamin-iron-minerals-folic acid (CENTRUM) chewable tablet Chew 1 tablet by mouth daily.        Updated Medication List Outpatient Encounter Prescriptions as of 09/28/2013  Medication Sig  . aspirin 81 MG tablet Take 81 mg by mouth daily.  Marland Kitchen atorvastatin (LIPITOR) 10 MG tablet Take 1 tablet (10 mg total) by mouth daily.  . budesonide (PULMICORT) 180 MCG/ACT inhaler Inhale 2 puffs into the lungs 2 (two) times daily.  . calcium-vitamin D (CALCIUM 500+D HIGH POTENCY) 500-400 MG-UNIT per tablet Take 1 tablet by mouth daily.  Marland Kitchen levothyroxine (SYNTHROID, LEVOTHROID) 100 MCG tablet Take 1 tablet (100 mcg total) by mouth daily.  Marland Kitchen losartan (COZAAR) 50 MG tablet Take 1 tablet (50 mg total) by mouth daily.  . metoprolol succinate (TOPROL XL) 50 MG 24 hr tablet Take 1 tablet (50 mg total) by mouth daily.  . NON FORMULARY Home oxygen 2 L at bedtime  . omeprazole (PRILOSEC) 40 MG capsule Take 1 capsule (40 mg  total) by mouth daily.  . predniSONE (DELTASONE) 20 MG tablet Take 30 mg by mouth every other day.   . tretinoin (RETIN-A) 0.025 % cream Apply topically daily.  Marland Kitchen triamterene-hydrochlorothiazide (DYAZIDE) 37.5-25 MG per capsule Take 1 each (1 capsule total) by mouth daily.  . [DISCONTINUED] budesonide (PULMICORT) 180 MCG/ACT inhaler Inhale 2 puffs into the lungs 2 (two) times daily.  . [DISCONTINUED] calcium citrate-vitamin D (CITRACAL+D) 315-200 MG-UNIT per tablet Take 1 tablet by mouth daily.  . [DISCONTINUED] MULTIPLE VITAMIN PO Take 1 tablet by mouth daily.   . multivitamin-iron-minerals-folic acid (CENTRUM) chewable tablet Chew 1 tablet by mouth daily.

## 2013-09-28 NOTE — Patient Instructions (Addendum)
Stay on pulmicort, refills sent Prevnar 13 given Call [facility] again about your prednisone dosing.  They may want to increase prednisone on off days  No other changes offered Return 4 months

## 2013-09-28 NOTE — Progress Notes (Signed)
Pt in for 30-day Cardiac Event Monitor.  Monitor hooked up and not detecting signal.  RN called [employer] and advised to have pt leave w/ monitor and try to connect at home.  If still stating [place], then pt can call them to walk through set-up or they will mail another monitor to patient.  Pt to call [employer] for questions and supplies for monitor as needed.  Pt/husband verbalized understanding and agreed w/ plan.  Pt discharged w/ kit in hand.

## 2013-09-29 ENCOUNTER — Encounter: Payer: Self-pay | Admitting: Cardiology

## 2013-09-29 ENCOUNTER — Ambulatory Visit: Payer: Medicare HMO | Admitting: Cardiology

## 2013-09-29 DIAGNOSIS — J984 Other disorders of lung: Secondary | ICD-10-CM | POA: Insufficient documentation

## 2013-09-29 NOTE — Assessment & Plan Note (Signed)
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

## 2013-10-14 ENCOUNTER — Ambulatory Visit (INDEPENDENT_AMBULATORY_CARE_PROVIDER_SITE_OTHER): Payer: Medicare HMO | Admitting: Family Medicine

## 2013-10-14 ENCOUNTER — Encounter: Payer: Self-pay | Admitting: Family Medicine

## 2013-10-14 VITALS — BP 140/80 | HR 104 | Temp 98.5°F | Wt 182.0 lb

## 2013-10-14 DIAGNOSIS — J209 Acute bronchitis, unspecified: Secondary | ICD-10-CM

## 2013-10-14 MED ORDER — AZITHROMYCIN 250 MG PO TABS: ORAL_TABLET | ORAL | Status: DC

## 2013-10-14 NOTE — Progress Notes (Signed)
   Subjective:    Patient ID: Christina Jacobs, female    DOB: 28-Apr-1941, 72 y.o.   MRN: [id number]  HPI Here for 5 days of PND, chest congestion and coughing up yellow sputum. No fever. Drinking fluids.    Review of Systems  Constitutional: Negative.   HENT: Positive for congestion and postnasal drip. Negative for sinus pressure.   Eyes: Negative.   Respiratory: Positive for cough, chest tightness, shortness of breath and wheezing.   Cardiovascular: Negative.        Objective:   Physical Exam  Constitutional: She appears well-developed and well-nourished. No distress.  HENT:  Right Ear: External ear normal.  Left Ear: External ear normal.  Nose: Nose normal.  Mouth/Throat: Oropharynx is clear and moist.  Eyes: Conjunctivae are normal.  Pulmonary/Chest: Effort normal. She has no rales.  Scattered rhonchi and wheezes   Lymphadenopathy:    She has no cervical adenopathy.          Assessment & Plan:  Add Mucinex

## 2013-12-07 ENCOUNTER — Ambulatory Visit (INDEPENDENT_AMBULATORY_CARE_PROVIDER_SITE_OTHER): Payer: Medicare HMO | Admitting: Cardiology

## 2013-12-07 ENCOUNTER — Encounter: Payer: Self-pay | Admitting: Cardiology

## 2013-12-07 VITALS — BP 160/100 | HR 87 | Ht 65.0 in | Wt 185.0 lb

## 2013-12-07 DIAGNOSIS — R0600 Dyspnea, unspecified: Secondary | ICD-10-CM

## 2013-12-07 DIAGNOSIS — R0989 Other specified symptoms and signs involving the circulatory and respiratory systems: Secondary | ICD-10-CM

## 2013-12-07 DIAGNOSIS — R0609 Other forms of dyspnea: Secondary | ICD-10-CM

## 2013-12-07 NOTE — Patient Instructions (Signed)
The current medical regimen is effective;  continue present plan and medications.  Follow up as needed 

## 2013-12-07 NOTE — Progress Notes (Signed)
HPI   The patient presents for evaluation ofshortness of breath and palpitations.  She has no history previously of coronary disease or dysrhythmias.  She has been treated with prednisone for myasthenia gravis recently. She associates some of her symptoms with this. At the last visit she was describing episodes of dyspnea as well as palpitations. However, since her dose of prednisone has been reduced she has been feeling better. Her shortness of breath is improved. She is not noticing any severe palpitations though she has some isolated beats. She did wear an event monitor and I reviewed this with her today. She had a couple of episodes of paroxysmal atrial tachycardia that were very brief and isolated PVCs. However, she said she hasn't had these in about a month.  Allergies  Allergen Reactions  . Codeine Hives  . Propoxyphene Hcl Hives  . Meperidine Hcl Rash    Current Outpatient Prescriptions  Medication Sig Dispense Refill  . aspirin 81 MG tablet Take 81 mg by mouth daily.      Marland Kitchen atorvastatin (LIPITOR) 10 MG tablet Take 1 tablet (10 mg total) by mouth daily.  90 tablet  3  . budesonide (PULMICORT) 180 MCG/ACT inhaler Inhale 2 puffs into the lungs 2 (two) times daily.  1 Inhaler  6  . calcium-vitamin D (CALCIUM 500+D HIGH POTENCY) 500-400 MG-UNIT per tablet Take 1 tablet by mouth daily.      Marland Kitchen levothyroxine (SYNTHROID, LEVOTHROID) 100 MCG tablet Take 1 tablet (100 mcg total) by mouth daily.  90 tablet  3  . losartan (COZAAR) 50 MG tablet Take 1 tablet (50 mg total) by mouth daily.  90 tablet  3  . metoprolol succinate (TOPROL XL) 50 MG 24 hr tablet Take 1 tablet (50 mg total) by mouth daily.  90 tablet  3  . multivitamin-iron-minerals-folic acid (CENTRUM) chewable tablet Chew 1 tablet by mouth daily.      . NON FORMULARY Home oxygen 2 L at bedtime      . omeprazole (PRILOSEC) 40 MG capsule Take 1 capsule (40 mg total) by mouth daily.  90 capsule  3  . predniSONE (DELTASONE) 5 MG tablet  Take 1 tablet by mouth daily.      Marland Kitchen tretinoin (RETIN-A) 0.025 % cream Apply topically daily.      Marland Kitchen triamterene-hydrochlorothiazide (DYAZIDE) 37.5-25 MG per capsule Take 1 each (1 capsule total) by mouth daily.  90 capsule  3   No current facility-administered medications for this visit.    Past Medical History  Diagnosis Date  . Hyperlipidemia   . Hypertension   . Hypothyroidism   . Osteoporosis     last DEXA 08-03-11  . Osteoarthritis     see's Dr. [name]  . Bursitis   . Myasthenia gravis 2013    followed at [facility] Dr Scheryl Marten, diplopia only  . Asthma     sees Dr. Lyda Jester     Past Surgical History  Procedure Laterality Date  . Cardiac stress test  02-24-07    normal  . Appendectomy    . Abdominal hysterectomy    . Tonsillectomy    . Breast surgery      benign breast lumps removed  . Thyroidectomy, partial    . Colonscopy   09-27-10    per Dr. Sharlett Iles,  repeat in 5  years   . Hernia repair  10-18-10    Dr. Hassell Done attempted but could reduce this, the entire stomach is above the diaphragm  ROS:  Positive for reflux, mild renal insufficiency, nocturia, leg cramping, feet swelling.  Otherwise as stated in the HPI and negative for all other systems.   PHYSICAL EXAM BP 160/100  Pulse 87  Ht 5\' 5"  (1.651 m)  Wt 185 lb (83.915 kg)  BMI 30.79 kg/m2  SpO2 94% GENERAL:  Well appearing NECK:  No jugular venous distention, waveform within normal limits, carotid upstroke brisk and symmetric, no bruits, no thyromegaly LUNGS:  Clear to auscultation bilaterally BACK:  No CVA tenderness CHEST:  Unremarkable HEART:  PMI not displaced or sustained,S1 and S2 within normal limits, no S3, no S4, no clicks, no rubs, no murmurs ABD:  Flat, positive bowel sounds normal in frequency in pitch, no bruits, no rebound, no guarding, no midline pulsatile mass, no hepatomegaly, no splenomegaly EXT:  2 plus pulses throughout, no edema, no cyanosis no clubbing   ASSESSMENT AND  PLAN  DYSPNEA:  This seems to be improved. At this point no change in therapy is indicated. She will continue with medications as listed.  PALPITATIONS:  Since these are less symptomatic than previous no change in therapy is planned. She will let me know if these worsen in the future and we could modify her medications.  HTN:  The blood pressure is elevated today. However, it has been well controlled at home. No change in medications is indicated. We will continue with therapeutic lifestyle changes (TLC).

## 2014-01-26 ENCOUNTER — Ambulatory Visit (INDEPENDENT_AMBULATORY_CARE_PROVIDER_SITE_OTHER): Payer: Medicare HMO | Admitting: Critical Care Medicine

## 2014-01-26 ENCOUNTER — Encounter: Payer: Self-pay | Admitting: Critical Care Medicine

## 2014-01-26 VITALS — BP 134/82 | HR 77 | Temp 98.0°F | Ht 66.0 in | Wt 186.0 lb

## 2014-01-26 DIAGNOSIS — J45909 Unspecified asthma, uncomplicated: Secondary | ICD-10-CM

## 2014-01-26 MED ORDER — BUDESONIDE 180 MCG/ACT IN AEPB: 2.0000 | INHALATION_SPRAY | Freq: Two times a day (BID) | RESPIRATORY_TRACT | Status: DC

## 2014-01-26 NOTE — Progress Notes (Signed)
Subjective:    Patient ID: Christina Jacobs, female    DOB: June 08, 1941, 73 y.o.   MRN: [id number]   72 y.o.WF with cyclical cough and moderate persistent asthma  due to microaspiration d/t reflux and large hiatal hernia  HPI 01/26/2014 Chief Complaint  Patient presents with  . Follow-up    Pt has no breathing complaints at this time.  [facility] lowered prednisone to 5mg  qd, is tolerating well.   Pt had more dyspnea while on high dose prednisone  Still on pulmicort.  Now down to 5mg  pred /d.  Has MG, doing well on this.  No heartburn.  On PPI daily 40mg  /d. On oxygen 2L qhs.     Past Medical History  Diagnosis Date  . Hyperlipidemia   . Hypertension   . Hypothyroidism   . Osteoporosis     last DEXA 08-03-11  . Osteoarthritis     see's Dr. [name]  . Bursitis   . Myasthenia gravis 2013    followed at [facility] Dr Scheryl Marten, diplopia only  . Asthma     sees Dr. Lyda Jester      Family History  Problem Relation Age of Onset  . Breast cancer Maternal Grandmother   . Diabetes    . Hyperlipidemia    . Hypertension    . Kidney cancer Mother   . Cancer Father   . Clotting disorder Brother      History   Social History  . Marital Status: Married    Spouse Name: N/A    Number of Children: 3  . Years of Education: N/A   Occupational History  .     Social History Main Topics  . Smoking status: Never Smoker   . Smokeless tobacco: Never Used  . Alcohol Use: No  . Drug Use: No  . Sexual Activity: Not on file   Other Topics Concern  . Not on file   Social History Narrative   Retired   Married           Allergies  Allergen Reactions  . Codeine Hives  . Propoxyphene Hcl Hives  . Meperidine Hcl Rash     Outpatient Prescriptions Prior to Visit  Medication Sig Dispense Refill  . aspirin 81 MG tablet Take 81 mg by mouth daily.      Marland Kitchen atorvastatin (LIPITOR) 10 MG tablet Take 1 tablet (10 mg total) by mouth daily.  90 tablet  3  . calcium-vitamin D (CALCIUM 500+D  HIGH POTENCY) 500-400 MG-UNIT per tablet Take 1 tablet by mouth daily.      Marland Kitchen levothyroxine (SYNTHROID, LEVOTHROID) 100 MCG tablet Take 1 tablet (100 mcg total) by mouth daily.  90 tablet  3  . losartan (COZAAR) 50 MG tablet Take 1 tablet (50 mg total) by mouth daily.  90 tablet  3  . metoprolol succinate (TOPROL XL) 50 MG 24 hr tablet Take 1 tablet (50 mg total) by mouth daily.  90 tablet  3  . multivitamin-iron-minerals-folic acid (CENTRUM) chewable tablet Chew 1 tablet by mouth daily.      . NON FORMULARY Home oxygen 2 L at bedtime      . tretinoin (RETIN-A) 0.025 % cream Apply topically daily.      Marland Kitchen triamterene-hydrochlorothiazide (DYAZIDE) 37.5-25 MG per capsule Take 1 each (1 capsule total) by mouth daily.  90 capsule  3  . budesonide (PULMICORT) 180 MCG/ACT inhaler Inhale 2 puffs into the lungs 2 (two) times daily.  1 Inhaler  6  .  omeprazole (PRILOSEC) 40 MG capsule Take 1 capsule (40 mg total) by mouth daily.  90 capsule  3   No facility-administered medications prior to visit.      Review of Systems  Constitutional: Negative for chills, diaphoresis, activity change, appetite change, fatigue and unexpected weight change.  HENT: Positive for voice change. Negative for congestion, dental problem, ear discharge, facial swelling, hearing loss, mouth sores, nosebleeds, postnasal drip, sinus pressure, sneezing, tinnitus and trouble swallowing.   Eyes: Negative for photophobia, discharge, itching and visual disturbance.  Respiratory: Negative for apnea, cough, choking, chest tightness and stridor.   Cardiovascular: Negative for palpitations.  Gastrointestinal: Negative for nausea, constipation, blood in stool and abdominal distention.  Genitourinary: Negative for dysuria, urgency, frequency, hematuria, flank pain, decreased urine volume and difficulty urinating.  Musculoskeletal: Positive for arthralgias. Negative for back pain, gait problem, joint swelling, myalgias and neck stiffness.   Skin: Negative for color change and pallor.  Neurological: Positive for dizziness. Negative for tremors, seizures, syncope, speech difficulty, weakness, light-headedness and numbness.  Hematological: Negative for adenopathy. Does not bruise/bleed easily.  Psychiatric/Behavioral: Positive for sleep disturbance. Negative for confusion and agitation. The patient is nervous/anxious.        Objective:   Physical Exam  Filed Vitals:   01/26/14 1122  BP: 134/82  Pulse: 77  Temp: 98 F (36.7 C)  TempSrc: Oral  Height: 5\' 6"  (1.676 m)  Weight: 186 lb (84.369 kg)  SpO2: 95%    Gen: Pleasant, well-nourished, in no distress,  normal affect  ENT: No lesions,  mouth clear,  oropharynx clear, no postnasal drip  Neck: No JVD, no TMG, no carotid bruits  Lungs: No use of accessory muscles, no dullness to percussion, distant BS  Cardiovascular: RRR, heart sounds normal, no murmur or gallops, no peripheral edema  Abdomen: soft and NT, no HSM,  BS normal  Musculoskeletal: No deformities, no cyanosis or clubbing  Neuro: alert, non focal  Skin: Warm, no lesions or rashes      Assessment & Plan:   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 aspirin 81 MG tablet Take 81 mg by mouth daily.  Marland Kitchen atorvastatin (LIPITOR) 10 MG tablet Take 1 tablet (10 mg total) by mouth daily.  . budesonide (PULMICORT) 180 MCG/ACT inhaler Inhale 2 puffs into the lungs 2 (two) times daily.  . calcium-vitamin D (CALCIUM 500+D HIGH POTENCY) 500-400 MG-UNIT per tablet Take 1 tablet by mouth daily.  Marland Kitchen levothyroxine (SYNTHROID, LEVOTHROID) 100 MCG tablet Take 1 tablet (100 mcg total) by  mouth daily.  Marland Kitchen losartan (COZAAR) 50 MG tablet Take 1 tablet (50 mg total) by mouth daily.  . metoprolol succinate (TOPROL XL) 50 MG 24 hr tablet Take 1 tablet (50 mg total) by mouth daily.  . multivitamin-iron-minerals-folic acid (CENTRUM) chewable tablet Chew 1 tablet by mouth daily.  . NON FORMULARY Home oxygen 2 L at bedtime  . predniSONE (DELTASONE) 5 MG tablet Take 5 mg by mouth daily.  Marland Kitchen tretinoin (RETIN-A) 0.025 % cream Apply topically daily.  Marland Kitchen triamterene-hydrochlorothiazide (DYAZIDE) 37.5-25 MG per capsule Take 1 each (1 capsule total) by mouth daily.  . [DISCONTINUED] budesonide (PULMICORT) 180 MCG/ACT inhaler Inhale 2 puffs into the lungs 2 (two)  times daily.  . [DISCONTINUED] omeprazole (PRILOSEC) 40 MG capsule Take 1 capsule (40 mg total) by mouth daily.        Updated Medication List Outpatient Encounter Prescriptions as of 01/26/2014  Medication Sig  . aspirin 81 MG tablet Take 81 mg by mouth daily.  Marland Kitchen atorvastatin (LIPITOR) 10 MG tablet Take 1 tablet (10 mg total) by mouth daily.  . budesonide (PULMICORT) 180 MCG/ACT inhaler Inhale 2 puffs into the lungs 2 (two) times daily.  . calcium-vitamin D (CALCIUM 500+D HIGH POTENCY) 500-400 MG-UNIT per tablet Take 1 tablet by mouth daily.  Marland Kitchen levothyroxine (SYNTHROID, LEVOTHROID) 100 MCG tablet Take 1 tablet (100 mcg total) by mouth daily.  Marland Kitchen losartan (COZAAR) 50 MG tablet Take 1 tablet (50 mg total) by mouth daily.  . metoprolol succinate (TOPROL XL) 50 MG 24 hr tablet Take 1 tablet (50 mg total) by mouth daily.  . multivitamin-iron-minerals-folic acid (CENTRUM) chewable tablet Chew 1 tablet by mouth daily.  . NON FORMULARY Home oxygen 2 L at bedtime  . predniSONE (DELTASONE) 5 MG tablet Take 5 mg by mouth daily.  Marland Kitchen tretinoin (RETIN-A) 0.025 % cream Apply topically daily.  Marland Kitchen triamterene-hydrochlorothiazide (DYAZIDE) 37.5-25 MG per capsule Take 1 each (1 capsule total) by mouth daily.  . [DISCONTINUED] budesonide (PULMICORT) 180  MCG/ACT inhaler Inhale 2 puffs into the lungs 2 (two) times daily.  . [DISCONTINUED] omeprazole (PRILOSEC) 40 MG capsule Take 1 capsule (40 mg total) by mouth daily.

## 2014-01-26 NOTE — Patient Instructions (Signed)
Stop omeprazole No other medication changes Return 4 months

## 2014-01-27 NOTE — Assessment & Plan Note (Signed)
Moderate persistent asthma exacerbated by reflux disease Currently patient's reflux symptoms are stable Plan Maintain Pulmicort Cycle off proton pump inhibitor and follow reflux diet on a strict basis

## 2014-02-16 ENCOUNTER — Encounter: Payer: Self-pay | Admitting: Family Medicine

## 2014-02-17 NOTE — Telephone Encounter (Signed)
Tell her that her thyroid levels were perfect and we do not need to change anything

## 2014-03-09 ENCOUNTER — Encounter: Payer: Self-pay | Admitting: Family Medicine

## 2014-07-15 ENCOUNTER — Ambulatory Visit: Payer: Medicare HMO | Admitting: Critical Care Medicine

## 2014-07-22 ENCOUNTER — Ambulatory Visit: Payer: Medicare HMO | Admitting: Critical Care Medicine

## 2014-07-30 ENCOUNTER — Encounter: Payer: Self-pay | Admitting: Family Medicine

## 2014-07-30 ENCOUNTER — Ambulatory Visit (INDEPENDENT_AMBULATORY_CARE_PROVIDER_SITE_OTHER): Payer: Medicare HMO | Admitting: Family Medicine

## 2014-07-30 VITALS — BP 148/90 | Temp 98.1°F | Ht 66.0 in | Wt 186.0 lb

## 2014-07-30 DIAGNOSIS — E039 Hypothyroidism, unspecified: Secondary | ICD-10-CM

## 2014-07-30 DIAGNOSIS — Z0000 Encounter for general adult medical examination without abnormal findings: Secondary | ICD-10-CM

## 2014-07-30 DIAGNOSIS — Z23 Encounter for immunization: Secondary | ICD-10-CM

## 2014-07-30 DIAGNOSIS — M899 Disorder of bone, unspecified: Secondary | ICD-10-CM

## 2014-07-30 DIAGNOSIS — E785 Hyperlipidemia, unspecified: Secondary | ICD-10-CM

## 2014-07-30 DIAGNOSIS — N289 Disorder of kidney and ureter, unspecified: Secondary | ICD-10-CM

## 2014-07-30 DIAGNOSIS — M949 Disorder of cartilage, unspecified: Secondary | ICD-10-CM

## 2014-07-30 DIAGNOSIS — I10 Essential (primary) hypertension: Secondary | ICD-10-CM

## 2014-07-30 LAB — CBC WITH DIFFERENTIAL/PLATELET
BASOS ABS: 0 10*3/uL (ref 0.0–0.1)
Basophils Relative: 0.4 % (ref 0.0–3.0)
Eosinophils Absolute: 0.1 10*3/uL (ref 0.0–0.7)
Eosinophils Relative: 1.3 % (ref 0.0–5.0)
HCT: 44.4 % (ref 36.0–46.0)
HEMOGLOBIN: 14.7 g/dL (ref 12.0–15.0)
Lymphocytes Relative: 26.8 % (ref 12.0–46.0)
Lymphs Abs: 2.7 10*3/uL (ref 0.7–4.0)
MCHC: 33 g/dL (ref 30.0–36.0)
MCV: 90.8 fl (ref 78.0–100.0)
MONOS PCT: 8.4 % (ref 3.0–12.0)
Monocytes Absolute: 0.8 10*3/uL (ref 0.1–1.0)
NEUTROS ABS: 6.3 10*3/uL (ref 1.4–7.7)
Neutrophils Relative %: 63.1 % (ref 43.0–77.0)
Platelets: 186 10*3/uL (ref 150.0–400.0)
RBC: 4.89 Mil/uL (ref 3.87–5.11)
RDW: 14.1 % (ref 11.5–15.5)
WBC: 10 10*3/uL (ref 4.0–10.5)

## 2014-07-30 LAB — HEPATIC FUNCTION PANEL
ALBUMIN: 4.3 g/dL (ref 3.5–5.2)
ALT: 26 U/L (ref 0–35)
AST: 30 U/L (ref 0–37)
Alkaline Phosphatase: 42 U/L (ref 39–117)
Bilirubin, Direct: 0.2 mg/dL (ref 0.0–0.3)
TOTAL PROTEIN: 7.2 g/dL (ref 6.0–8.3)
Total Bilirubin: 1.5 mg/dL — ABNORMAL HIGH (ref 0.2–1.2)

## 2014-07-30 LAB — LIPID PANEL
CHOL/HDL RATIO: 2
Cholesterol: 163 mg/dL (ref 0–200)
HDL: 68.7 mg/dL (ref >39.00)
LDL Cholesterol: 71 mg/dL (ref 0–99)
NONHDL: 94.3
Triglycerides: 116 mg/dL (ref 0.0–149.0)
VLDL: 23.2 mg/dL (ref 0.0–40.0)

## 2014-07-30 LAB — BASIC METABOLIC PANEL
BUN: 18 mg/dL (ref 6–23)
CHLORIDE: 102 meq/L (ref 96–112)
CO2: 28 mEq/L (ref 19–32)
Calcium: 10.4 mg/dL (ref 8.4–10.5)
Creatinine, Ser: 1.3 mg/dL — ABNORMAL HIGH (ref 0.4–1.2)
GFR: 44.2 mL/min — AB (ref >60.00)
GLUCOSE: 90 mg/dL (ref 70–99)
Potassium: 3.5 mEq/L (ref 3.5–5.1)
SODIUM: 138 meq/L (ref 135–145)

## 2014-07-30 LAB — TSH: TSH: 0.26 u[IU]/mL — AB (ref 0.35–4.50)

## 2014-07-30 MED ORDER — TRIAMTERENE-HCTZ 37.5-25 MG PO CAPS: 1.0000 | ORAL_CAPSULE | Freq: Every day | ORAL | Status: DC

## 2014-07-30 MED ORDER — ATORVASTATIN CALCIUM 10 MG PO TABS: 10.0000 mg | ORAL_TABLET | Freq: Every day | ORAL | Status: DC

## 2014-07-30 MED ORDER — METOPROLOL SUCCINATE ER 50 MG PO TB24: 50.0000 mg | ORAL_TABLET | Freq: Every day | ORAL | Status: DC

## 2014-07-30 MED ORDER — LEVOTHYROXINE SODIUM 100 MCG PO TABS: 100.0000 ug | ORAL_TABLET | Freq: Every day | ORAL | Status: DC

## 2014-07-30 MED ORDER — LOSARTAN POTASSIUM 50 MG PO TABS: 50.0000 mg | ORAL_TABLET | Freq: Every day | ORAL | Status: DC

## 2014-07-30 NOTE — Progress Notes (Signed)
   Subjective:    Patient ID: Christina Jacobs, female    DOB: Oct 26, 1941, 73 y.o.   MRN: [id number]  HPI 73 yr old female for a cpx. She feels well. She still sees Dr. Percival Spanish and Dr. Moshe Cipro regularly.    Review of Systems  Constitutional: Negative.   HENT: Negative.   Eyes: Negative.   Respiratory: Negative.   Cardiovascular: Negative.   Gastrointestinal: Negative.   Genitourinary: Negative for dysuria, urgency, frequency, hematuria, flank pain, decreased urine volume, enuresis, difficulty urinating, pelvic pain and dyspareunia.  Musculoskeletal: Negative.   Skin: Negative.   Neurological: Negative.   Psychiatric/Behavioral: Negative.        Objective:   Physical Exam  Constitutional: She is oriented to person, place, and time. She appears well-developed and well-nourished. No distress.  HENT:  Head: Normocephalic and atraumatic.  Right Ear: External ear normal.  Left Ear: External ear normal.  Nose: Nose normal.  Mouth/Throat: Oropharynx is clear and moist. No oropharyngeal exudate.  Eyes: Conjunctivae and EOM are normal. Pupils are equal, round, and reactive to light. No scleral icterus.  Neck: Normal range of motion. Neck supple. No JVD present. No thyromegaly present.  Cardiovascular: Normal rate, regular rhythm, normal heart sounds and intact distal pulses.  Exam reveals no gallop and no friction rub.   No murmur heard. Pulmonary/Chest: Effort normal and breath sounds normal. No respiratory distress. She has no wheezes. She has no rales. She exhibits no tenderness.  Abdominal: Soft. Bowel sounds are normal. She exhibits no distension and no mass. There is no tenderness. There is no rebound and no guarding.  Musculoskeletal: Normal range of motion. She exhibits no edema and no tenderness.  Lymphadenopathy:    She has no cervical adenopathy.  Neurological: She is alert and oriented to person, place, and time. She has normal reflexes. No cranial nerve deficit. She  exhibits normal muscle tone. Coordination normal.  Skin: Skin is warm and dry. No rash noted. No erythema.  Psychiatric: She has a normal mood and affect. Her behavior is normal. Judgment and thought content normal.          Assessment & Plan:  Well exam. Get fasting labs.  Set up another DEXA.

## 2014-07-30 NOTE — Progress Notes (Signed)
Pre visit review using our clinic review tool, if applicable. No additional management support is needed unless otherwise documented below in the visit note. 

## 2014-08-12 ENCOUNTER — Ambulatory Visit: Payer: Medicare HMO | Admitting: Critical Care Medicine

## 2014-08-19 ENCOUNTER — Encounter: Payer: Self-pay | Admitting: Critical Care Medicine

## 2014-08-19 ENCOUNTER — Ambulatory Visit (INDEPENDENT_AMBULATORY_CARE_PROVIDER_SITE_OTHER): Payer: Medicare HMO | Admitting: Critical Care Medicine

## 2014-08-19 VITALS — BP 146/86 | HR 84 | Temp 97.7°F | Ht 66.0 in | Wt 185.0 lb

## 2014-08-19 DIAGNOSIS — J4540 Moderate persistent asthma, uncomplicated: Secondary | ICD-10-CM

## 2014-08-19 MED ORDER — ALBUTEROL SULFATE 108 (90 BASE) MCG/ACT IN AEPB: 2.0000 | INHALATION_SPRAY | Freq: Four times a day (QID) | RESPIRATORY_TRACT | Status: DC | PRN

## 2014-08-19 MED ORDER — BUDESONIDE 180 MCG/ACT IN AEPB: 2.0000 | INHALATION_SPRAY | Freq: Two times a day (BID) | RESPIRATORY_TRACT | Status: DC

## 2014-08-19 NOTE — Patient Instructions (Addendum)
Trial Proair respiclick 1-2 puff every 6 hours as needed, try this before a heavy meal Stay on pulmicort Stay on oxygen at night Return 6 months

## 2014-08-19 NOTE — Progress Notes (Signed)
Subjective:    Patient ID: Christina Jacobs, female    DOB: [birth year], 73 y.o.   MRN: [id number]  HPI 08/19/2014 Chief Complaint  Patient presents with  . Follow-up    Increased SOB "comes and goes" - worse after eating.  No wheezing, cough, or chest tightness/pain.  Overall the patient is stable. Dyspnea comes and goes and is worse after large heavy meals. There is no wheezing cough or chest congestion. There is no chest tightness. There is no fever chills or sweats. The patient does not have a rescue inhaler but only is on inhaled steroid.    Review of Systems Constitutional:   No  weight loss, night sweats,  Fevers, chills, fatigue, lassitude. HEENT:   No headaches,  Difficulty swallowing,  Tooth/dental problems,  Sore throat,                No sneezing, itching, ear ache, nasal congestion, post nasal drip,   CV:  No chest pain,  Orthopnea, PND, swelling in lower extremities, anasarca, dizziness, palpitations  GI  No heartburn,+++ indigestion, abdominal pain, nausea, vomiting, diarrhea, change in bowel habits, loss of appetite  Resp: Notes  shortness of breath with large heavy meals.  No excess mucus, no productive cough,  No non-productive cough,  No coughing up of blood.  No change in color of mucus.  No wheezing.  No chest wall deformity  Skin: no rash or lesions.  GU: no dysuria, change in color of urine, no urgency or frequency.  No flank pain.  MS:  No joint pain or swelling.  No decreased range of motion.  No back pain.  Psych:  No change in mood or affect. No depression or anxiety.  No memory loss.     Objective:   Physical Exam Filed Vitals:   08/19/14 1045  BP: 146/86  Pulse: 84  Temp: 97.7 F (36.5 C)  TempSrc: Oral  Height: 5\' 6"  (1.676 m)  Weight: 185 lb (83.915 kg)  SpO2: 93%    Gen: Pleasant, well-nourished, in no distress,  normal affect  ENT: No lesions,  mouth clear,  oropharynx clear, no postnasal drip  Neck: No JVD, no TMG, no carotid  bruits  Lungs: No use of accessory muscles, no dullness to percussion, clear without rales or rhonchi  Cardiovascular: RRR, heart sounds normal, no murmur or gallops, no peripheral edema  Abdomen: soft and NT, no HSM,  BS normal  Musculoskeletal: No deformities, no cyanosis or clubbing  Neuro: alert, non focal  Skin: Warm, no lesions or rashes  No results found.     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 Note the patient does not have a rescue inhaler. Plan Trial Proair respiclick 1-2 puff every 6 hours as needed, try this before a heavy meal Stay on pulmicort Stay on oxygen at night Return 6 months    Updated Medication List Outpatient Encounter Prescriptions as of 08/19/2014  Medication Sig  . aspirin 81 MG tablet Take 81 mg by mouth daily.  Marland Kitchen atorvastatin (LIPITOR) 10 MG tablet Take 1 tablet (10 mg total) by mouth daily.  . budesonide (PULMICORT) 180 MCG/ACT inhaler Inhale 2 puffs into the lungs 2 (two) times daily.  . calcium-vitamin D (CALCIUM 500+D HIGH POTENCY) 500-400 MG-UNIT per tablet Take 1 tablet by mouth daily.  Marland Kitchen  levothyroxine (SYNTHROID, LEVOTHROID) 100 MCG tablet Take 1 tablet (100 mcg total) by mouth daily.  Marland Kitchen losartan (COZAAR) 50 MG tablet Take 1 tablet (50 mg total) by mouth daily.  . metoprolol succinate (TOPROL XL) 50 MG 24 hr tablet Take 1 tablet (50 mg total) by mouth daily.  . multivitamin-iron-minerals-folic acid (CENTRUM) chewable tablet Chew 1 tablet by mouth daily.  . NON FORMULARY Home oxygen 2 L at bedtime  . predniSONE (DELTASONE) 5 MG tablet Take 5 mg by mouth daily.  Marland Kitchen tretinoin (RETIN-A) 0.025 % cream Apply topically daily.  Marland Kitchen triamterene-hydrochlorothiazide (DYAZIDE) 37.5-25 MG per capsule Take 1 each (1 capsule total) by mouth  daily.  . [DISCONTINUED] budesonide (PULMICORT) 180 MCG/ACT inhaler Inhale 2 puffs into the lungs 2 (two) times daily.  . Albuterol Sulfate (PROAIR RESPICLICK) 017 (90 BASE) MCG/ACT AEPB Inhale 2 puffs into the lungs every 6 (six) hours as needed.

## 2014-08-20 NOTE — Assessment & Plan Note (Signed)
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 Note the patient does not have a rescue inhaler. Plan Trial Proair respiclick 1-2 puff every 6 hours as needed, try this before a heavy meal Stay on pulmicort Stay on oxygen at night Return 6 months

## 2014-11-29 ENCOUNTER — Ambulatory Visit (INDEPENDENT_AMBULATORY_CARE_PROVIDER_SITE_OTHER): Payer: Medicare HMO | Admitting: Family Medicine

## 2014-11-29 ENCOUNTER — Encounter: Payer: Self-pay | Admitting: Family Medicine

## 2014-11-29 VITALS — BP 156/93 | HR 89 | Temp 98.3°F | Ht 66.0 in | Wt 186.0 lb

## 2014-11-29 DIAGNOSIS — M8580 Other specified disorders of bone density and structure, unspecified site: Secondary | ICD-10-CM

## 2014-11-29 DIAGNOSIS — N390 Urinary tract infection, site not specified: Secondary | ICD-10-CM

## 2014-11-29 LAB — POCT URINALYSIS DIPSTICK
BILIRUBIN UA: NEGATIVE
Glucose, UA: NEGATIVE
Ketones, UA: NEGATIVE
Spec Grav, UA: 1.02
UROBILINOGEN UA: 0.2
pH, UA: 6

## 2014-11-29 MED ORDER — NITROFURANTOIN MONOHYD MACRO 100 MG PO CAPS: 100.0000 mg | ORAL_CAPSULE | Freq: Two times a day (BID) | ORAL | Status: DC

## 2014-11-29 NOTE — Progress Notes (Signed)
   Subjective:    Patient ID: Christina Jacobs, female    DOB: Jan 28, 1941, 74 y.o.   MRN: [id number]  HPI Here for 2 weeks of urinary urgency and burning. No fever. Drinking plenty of water.    Review of Systems  Constitutional: Negative.   Genitourinary: Positive for dysuria, urgency and frequency. Negative for hematuria and flank pain.       Objective:   Physical Exam  Constitutional: She appears well-developed and well-nourished.  Abdominal: Soft. Bowel sounds are normal. She exhibits no distension and no mass. There is no tenderness. There is no rebound and no guarding.          Assessment & Plan:  Treat with Macrobid. Culture the sample.

## 2014-11-29 NOTE — Progress Notes (Signed)
Pre visit review using our clinic review tool, if applicable. No additional management support is needed unless otherwise documented below in the visit note. 

## 2014-11-29 NOTE — Addendum Note (Signed)
Addended by: Aggie Hacker A on: 11/29/2014 01:58 PM   Modules accepted: Orders

## 2014-12-02 LAB — URINE CULTURE: Colony Count: >=100000

## 2014-12-06 ENCOUNTER — Telehealth: Payer: Self-pay | Admitting: Family Medicine

## 2014-12-06 NOTE — Telephone Encounter (Signed)
Pt call about lab results and is requesting a call back

## 2014-12-07 NOTE — Telephone Encounter (Signed)
Per Dr. Sarajane Jews, pt does not need another script at this time. I spoke with pt and gave this message.

## 2014-12-07 NOTE — Telephone Encounter (Signed)
Pt stated that she finished the antibiotic on 12/05/14 & she is not having any symptoms at this time.

## 2014-12-07 NOTE — Telephone Encounter (Signed)
I spoke with pt and went over urine culture results. She purchased a kit from the drug store to test for UTI and is was positive. Can she stop by and give another sample of urine to test or does she need to see you?

## 2014-12-15 ENCOUNTER — Encounter: Payer: Self-pay | Admitting: Family Medicine

## 2014-12-15 ENCOUNTER — Ambulatory Visit (INDEPENDENT_AMBULATORY_CARE_PROVIDER_SITE_OTHER): Payer: Medicare HMO | Admitting: Family Medicine

## 2014-12-15 ENCOUNTER — Telehealth: Payer: Self-pay | Admitting: Family Medicine

## 2014-12-15 VITALS — BP 161/96 | HR 94 | Temp 98.5°F | Ht 66.0 in | Wt 186.0 lb

## 2014-12-15 DIAGNOSIS — N390 Urinary tract infection, site not specified: Secondary | ICD-10-CM

## 2014-12-15 DIAGNOSIS — R8299 Other abnormal findings in urine: Secondary | ICD-10-CM

## 2014-12-15 DIAGNOSIS — R8290 Unspecified abnormal findings in urine: Secondary | ICD-10-CM

## 2014-12-15 LAB — POCT URINALYSIS DIPSTICK
Bilirubin, UA: NEGATIVE
Blood, UA: NEGATIVE
Glucose, UA: NEGATIVE
KETONES UA: NEGATIVE
Nitrite, UA: NEGATIVE
PH UA: 7.5
Protein, UA: NEGATIVE
Spec Grav, UA: 1.01
Urobilinogen, UA: 0.2

## 2014-12-15 MED ORDER — SULFAMETHOXAZOLE-TRIMETHOPRIM 800-160 MG PO TABS: 1.0000 | ORAL_TABLET | Freq: Two times a day (BID) | ORAL | Status: DC

## 2014-12-15 NOTE — Progress Notes (Signed)
   Subjective:    Patient ID: Christina Jacobs, female    DOB: Nov 03, 1941, 74 y.o.   MRN: [id number]  HPI Here for an unresolved UTI. She was here 2 weeks ago and we gave her Macrobid, which should have worked going by the [employer] on the culture. However she still has some urgency and burning.    Review of Systems  Constitutional: Negative.   Genitourinary: Positive for dysuria and frequency. Negative for hematuria and flank pain.       Objective:   Physical Exam  Constitutional: She appears well-developed and well-nourished.  Abdominal: Soft. Bowel sounds are normal. She exhibits no distension and no mass. There is no tenderness. There is no rebound and no guarding.          Assessment & Plan:  This time we will try Bactrim DS for 7 days.

## 2014-12-15 NOTE — Progress Notes (Signed)
Pre visit review using our clinic review tool, if applicable. No additional management support is needed unless otherwise documented below in the visit note. 

## 2014-12-15 NOTE — Telephone Encounter (Signed)
Pt called to schedule office visit.

## 2014-12-17 LAB — URINE CULTURE: Colony Count: 30000

## 2014-12-27 ENCOUNTER — Telehealth: Payer: Self-pay | Admitting: Family Medicine

## 2014-12-27 NOTE — Telephone Encounter (Signed)
Pt finished medication for UTI four days ago. She has taken another test from drug store, it tested positive for leukocytes but at the low end. She does not have any symptoms to report. Also she would like results from her bone density scan.

## 2014-12-27 NOTE — Telephone Encounter (Signed)
I have not received any results of a bone density. Since she is not symptomatic, let's not treat a positive OTC urine test. Just drink lots of water and wait to see how she feels

## 2014-12-28 NOTE — Telephone Encounter (Signed)
I spoke with pt and went over below information. 

## 2015-01-05 ENCOUNTER — Ambulatory Visit (INDEPENDENT_AMBULATORY_CARE_PROVIDER_SITE_OTHER): Payer: Medicare HMO | Admitting: Family Medicine

## 2015-01-05 ENCOUNTER — Encounter: Payer: Self-pay | Admitting: Family Medicine

## 2015-01-05 VITALS — BP 158/91 | HR 93 | Temp 98.2°F | Ht 66.0 in | Wt 188.0 lb

## 2015-01-05 DIAGNOSIS — R1030 Lower abdominal pain, unspecified: Secondary | ICD-10-CM | POA: Diagnosis not present

## 2015-01-05 DIAGNOSIS — N390 Urinary tract infection, site not specified: Secondary | ICD-10-CM | POA: Diagnosis not present

## 2015-01-05 LAB — CBC WITH DIFFERENTIAL/PLATELET
Basophils Absolute: 0 10*3/uL (ref 0.0–0.1)
Basophils Relative: 0.5 % (ref 0.0–3.0)
EOS ABS: 0.1 10*3/uL (ref 0.0–0.7)
EOS PCT: 1 % (ref 0.0–5.0)
HCT: 42.7 % (ref 36.0–46.0)
Hemoglobin: 14.4 g/dL (ref 12.0–15.0)
LYMPHS PCT: 26.8 % (ref 12.0–46.0)
Lymphs Abs: 2.1 10*3/uL (ref 0.7–4.0)
MCHC: 33.8 g/dL (ref 30.0–36.0)
MCV: 88.1 fl (ref 78.0–100.0)
MONOS PCT: 8.1 % (ref 3.0–12.0)
Monocytes Absolute: 0.6 10*3/uL (ref 0.1–1.0)
NEUTROS ABS: 4.9 10*3/uL (ref 1.4–7.7)
NEUTROS PCT: 63.6 % (ref 43.0–77.0)
Platelets: 185 10*3/uL (ref 150.0–400.0)
RBC: 4.85 Mil/uL (ref 3.87–5.11)
RDW: 13.5 % (ref 11.5–15.5)
WBC: 7.7 10*3/uL (ref 4.0–10.5)

## 2015-01-05 LAB — BASIC METABOLIC PANEL
BUN: 18 mg/dL (ref 6–23)
CHLORIDE: 103 meq/L (ref 96–112)
CO2: 30 meq/L (ref 19–32)
Calcium: 9.9 mg/dL (ref 8.4–10.5)
Creatinine, Ser: 1.2 mg/dL (ref 0.40–1.20)
GFR: 46.71 mL/min — ABNORMAL LOW (ref >60.00)
GLUCOSE: 115 mg/dL — AB (ref 70–99)
POTASSIUM: 3.8 meq/L (ref 3.5–5.1)
SODIUM: 138 meq/L (ref 135–145)

## 2015-01-05 LAB — POCT URINALYSIS DIPSTICK
Bilirubin, UA: NEGATIVE
Blood, UA: NEGATIVE
GLUCOSE UA: NEGATIVE
KETONES UA: NEGATIVE
Nitrite, UA: NEGATIVE
Urobilinogen, UA: 0.2
pH, UA: 8

## 2015-01-05 MED ORDER — ALIGN 4 MG PO CAPS: 1.0000 | ORAL_CAPSULE | Freq: Every day | ORAL | Status: DC

## 2015-01-05 MED ORDER — POLYETHYLENE GLYCOL 3350 17 GM/SCOOP PO POWD: 17.0000 g | Freq: Every day | ORAL | Status: DC

## 2015-01-05 MED ORDER — NITROFURANTOIN MONOHYD MACRO 100 MG PO CAPS: 100.0000 mg | ORAL_CAPSULE | Freq: Two times a day (BID) | ORAL | Status: DC

## 2015-01-05 NOTE — Progress Notes (Signed)
   Subjective:    Patient ID: Christina Jacobs, female    DOB: 1941-10-26, 74 y.o.   MRN: [id number]  HPI Here to follow up on UTIs and to discuss other abdominal issues. She was treated in January for a UTI with Macrobid, but her urgency and burning did not improve, so she was given Bactrim. Her symptoms improved but at home urine tests continued to indicate the presence of bacteria. She also asks about intermittent lower abdominal pains which started a few months ago. She is usually constipated but sometimes she has loose stools and often passes large amounts of flatus. Sometimes she has urgency and incontinence of her bowels also. Her last colonoscopy was 4 years ago and she is scheduled for another one next year.    Review of Systems  Constitutional: Negative.   Respiratory: Negative.   Cardiovascular: Negative.   Gastrointestinal: Positive for abdominal pain, diarrhea and constipation. Negative for nausea, vomiting, blood in stool, abdominal distention, anal bleeding and rectal pain.  Genitourinary: Positive for urgency and frequency. Negative for dysuria, hematuria and flank pain.       Objective:   Physical Exam  Constitutional: She appears well-developed and well-nourished.  Cardiovascular: Normal rate, regular rhythm, normal heart sounds and intact distal pulses.   Pulmonary/Chest: Effort normal and breath sounds normal.  Abdominal: Soft. Bowel sounds are normal.          Assessment & Plan:  We will treat her current UTI with Macrobid and will send her sample today for another culture. Refer to Urology for recurrent UTIs. She is probably chronically constipated and periodically passes loose stools around impacted stools. Suggested she take Miralax 17 gm daily and Align daily. Set up a contrasted CT soon of the abdomen and pelvis.

## 2015-01-05 NOTE — Addendum Note (Signed)
Addended by: Aggie Hacker A on: 01/05/2015 04:42 PM   Modules accepted: Orders

## 2015-01-05 NOTE — Progress Notes (Signed)
Pre visit review using our clinic review tool, if applicable. No additional management support is needed unless otherwise documented below in the visit note. 

## 2015-01-07 LAB — URINE CULTURE: Colony Count: 6000

## 2015-01-12 ENCOUNTER — Ambulatory Visit (INDEPENDENT_AMBULATORY_CARE_PROVIDER_SITE_OTHER)
Admission: RE | Admit: 2015-01-12 | Discharge: 2015-01-12 | Disposition: A | Discharge status: Home / Self Care | Payer: Medicare HMO | Source: Ambulatory Visit | Attending: Family Medicine | Admitting: Family Medicine

## 2015-01-12 DIAGNOSIS — R1030 Lower abdominal pain, unspecified: Secondary | ICD-10-CM

## 2015-01-12 MED ORDER — IOHEXOL 300 MG/ML  SOLN
100.0000 mL | Freq: Once | INTRAMUSCULAR | Status: AC | PRN
  Administered 2015-01-12: 100 mL via INTRAVENOUS

## 2015-01-14 ENCOUNTER — Encounter: Payer: Self-pay | Admitting: Family Medicine

## 2015-01-17 ENCOUNTER — Telehealth: Payer: Self-pay | Admitting: Family Medicine

## 2015-01-17 NOTE — Telephone Encounter (Signed)
Pt took another test from the drug store and it was positive for UTI, she has no symptoms. She is waiting on a call from Urology to giver her a appointment.

## 2015-01-19 NOTE — Telephone Encounter (Signed)
I spoke with Christina Jacobs and pt has appointment February 16, 2015 with urology.

## 2015-01-19 NOTE — Telephone Encounter (Signed)
I spoke with pt and she is aware of her upcoming appointment. She will call us back if she starts to have any symptoms.

## 2015-04-05 IMAGING — CT CT CHEST W/O CM
2 of 4 series · 15 of 36 positions shown, 18 images · IV contrast (Omnipaque 300)
Comparison: Chest CT, 05/14/2012

CLINICAL DATA: Routine followup for right upper lobe lesion.

EXAM:
CT CHEST WITHOUT CONTRAST
TECHNIQUE: Multidetector CT imaging of the chest was performed following the
standard protocol without IV contrast.

[Series 2: chest routine with · axial · 0.71mm/px · z∈[-300,-54]mm · 12 of 59 slices shown, 15 images]
[im 5/59  mediastinal]
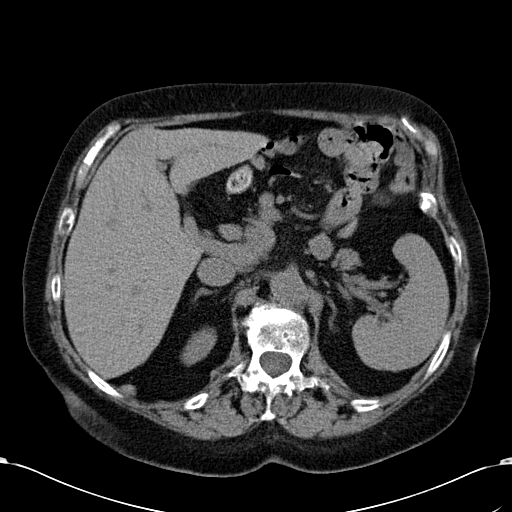
[im 5/59  lung]
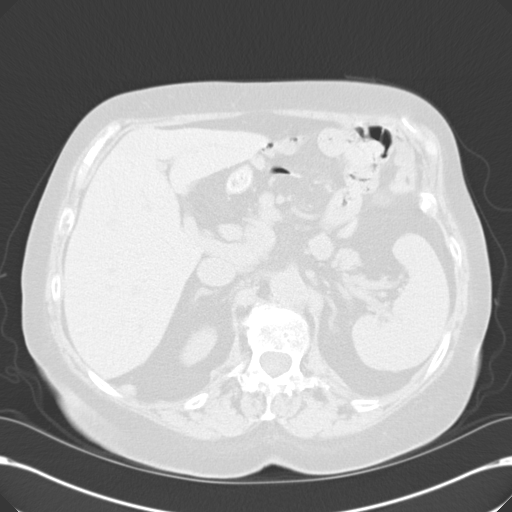
[im 9/59  lung]
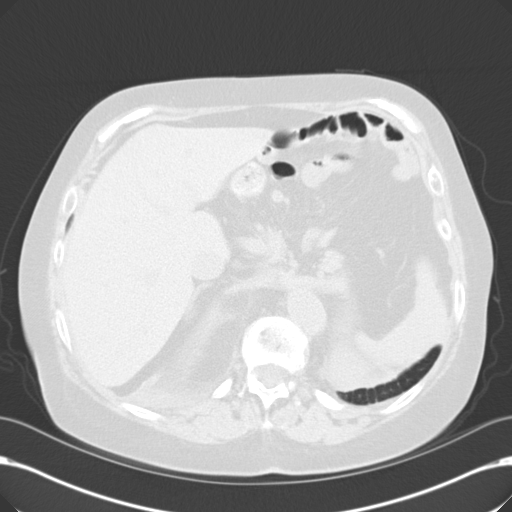
[im 14/59  lung]
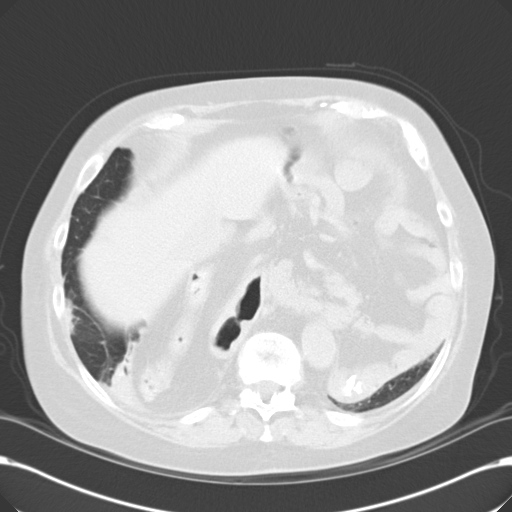
[im 18/59  lung]
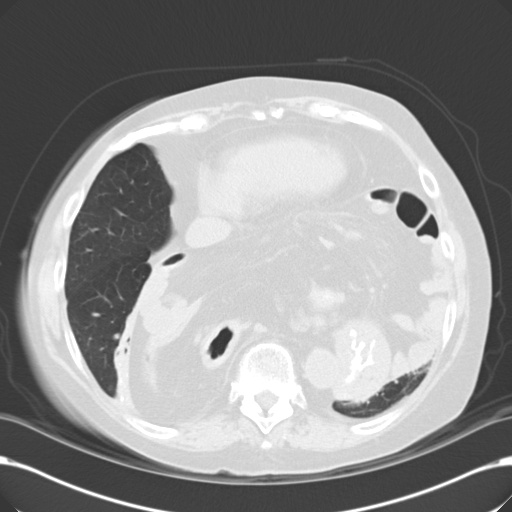
[im 23/59  mediastinal]
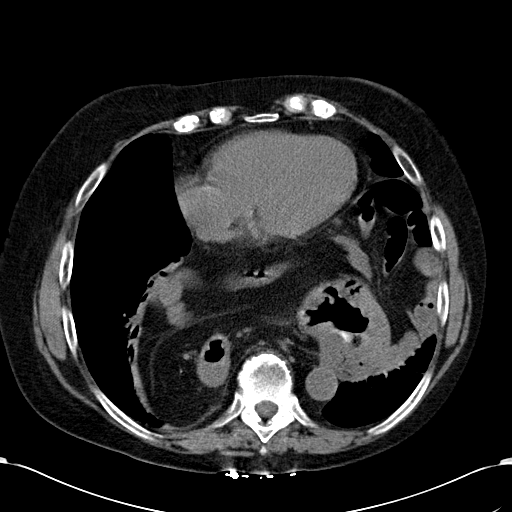
[im 23/59  lung]
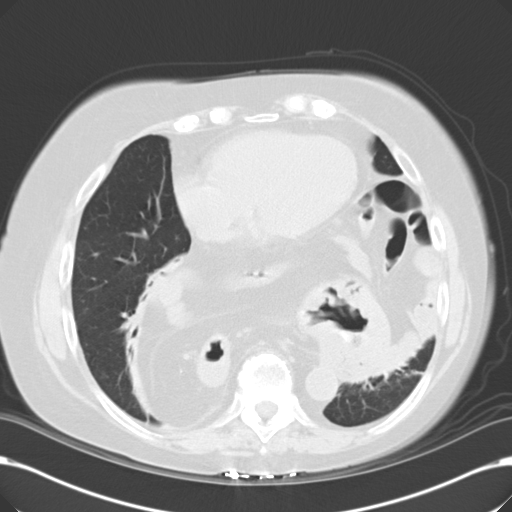
[im 27/59  lung]
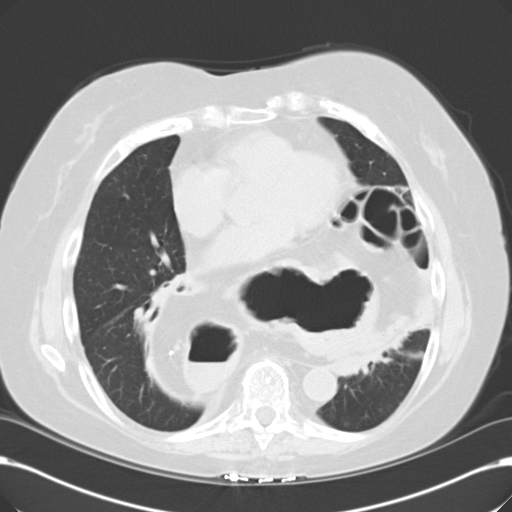
[im 32/59  lung]
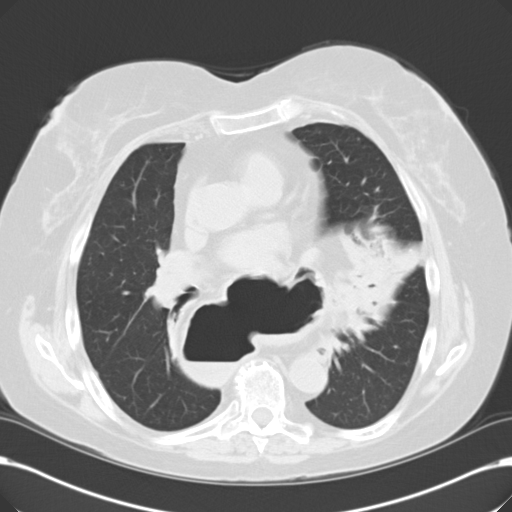
[im 36/59  lung]
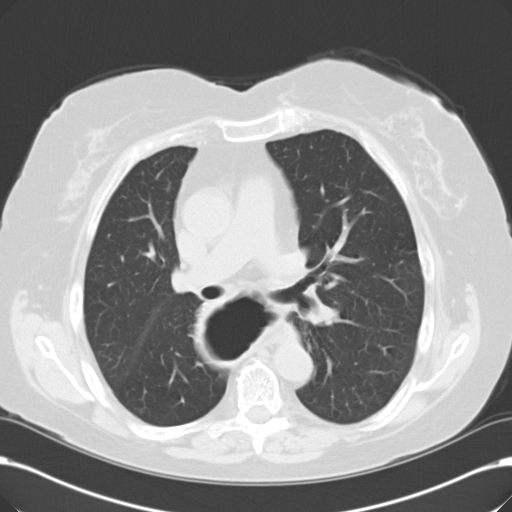
[im 41/59  mediastinal]
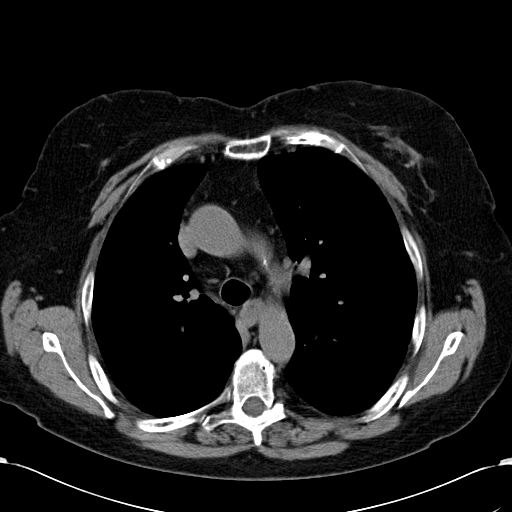
[im 41/59  lung]
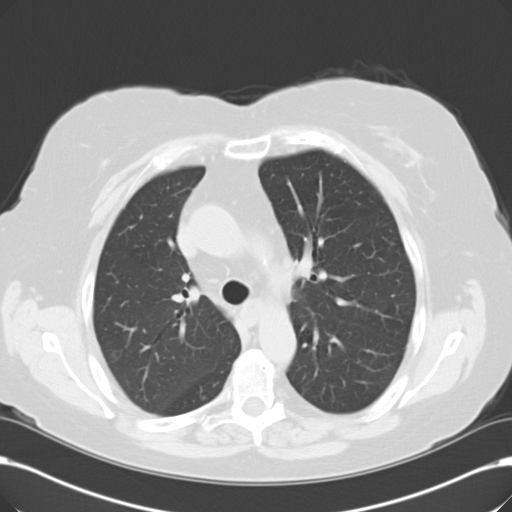
[im 45/59  lung]
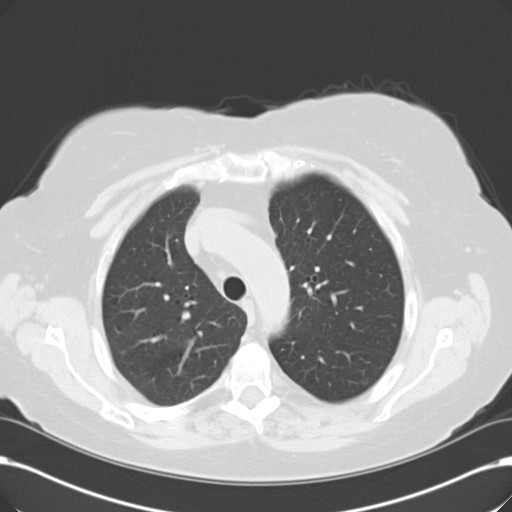
[im 50/59  lung]
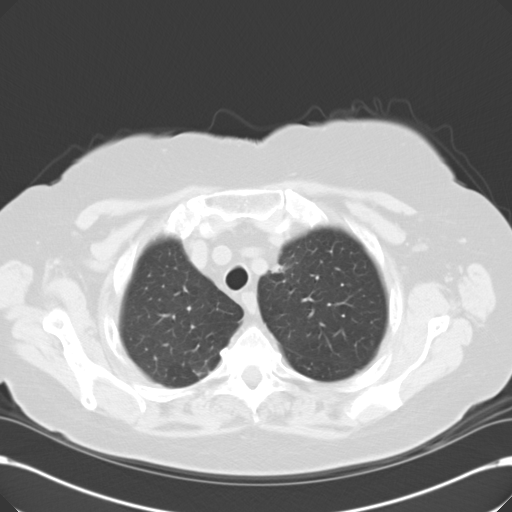
[im 54/59  lung]
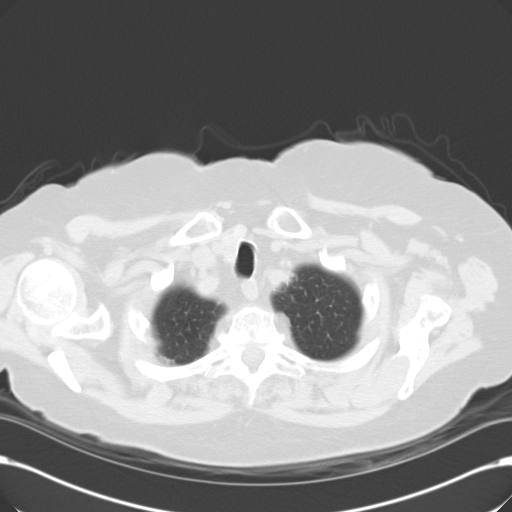

[Series 602: cor · coronal · 0.71mm/px · 3 of 128 slices shown]
[im 26/128  lung]
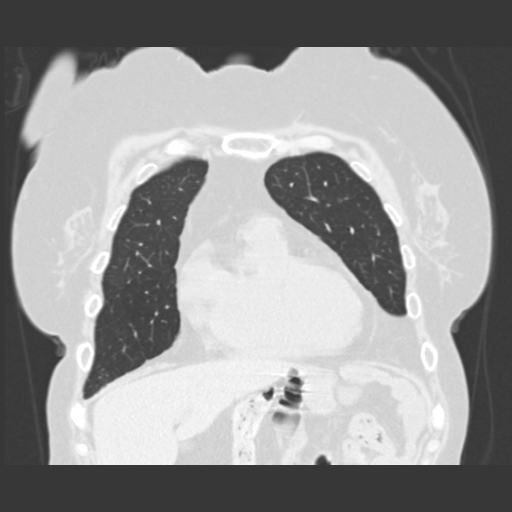
[im 51/128  lung]
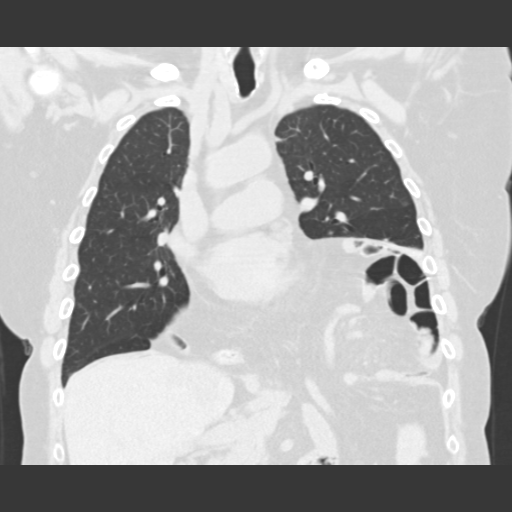
[im 77/128  lung]
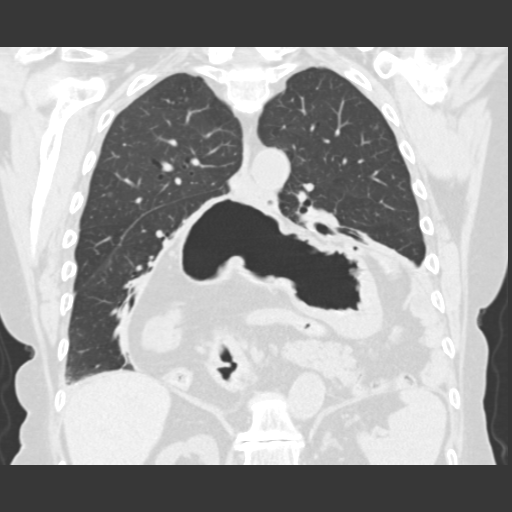

[15 of 36 positions shown; findings below may reference images not displayed]

FINDINGS: 3 mm right upper lobe nodule is stable.

There are a few tiny vague nodular opacities also noted in the right
upper lobe. There is atelectasis adjacent to a massive hiatal
hernia. This is stable. The lungs are otherwise clear.

The heart is normal in size and configuration. No mediastinal or
hilar masses or enlarged lymph nodes are appreciated. There are no
neck base or axillary masses or adenopathy.

Degenerative changes are noted throughout the visualized spine. The
bones are demineralized. No osteoblastic or osteolytic lesions.
IMPRESSION: 1. Stable 3 mm right upper lobe nodule. This is benign needing no
additional evaluation.
2. Massive hiatal hernia associated with compressive lower lung
atelectasis. This is stable from the prior study.
3. No acute findings.

## 2015-04-28 ENCOUNTER — Ambulatory Visit (INDEPENDENT_AMBULATORY_CARE_PROVIDER_SITE_OTHER): Payer: Medicare HMO | Admitting: Pulmonary Disease

## 2015-04-28 ENCOUNTER — Encounter: Payer: Self-pay | Admitting: Pulmonary Disease

## 2015-04-28 VITALS — BP 152/82 | HR 82 | Temp 98.0°F | Ht 66.0 in | Wt 186.0 lb

## 2015-04-28 DIAGNOSIS — R0609 Other forms of dyspnea: Secondary | ICD-10-CM

## 2015-04-28 DIAGNOSIS — J984 Other disorders of lung: Secondary | ICD-10-CM | POA: Diagnosis not present

## 2015-04-28 DIAGNOSIS — R0600 Dyspnea, unspecified: Secondary | ICD-10-CM

## 2015-04-28 DIAGNOSIS — J4530 Mild persistent asthma, uncomplicated: Secondary | ICD-10-CM

## 2015-04-28 MED ORDER — BUDESONIDE 180 MCG/ACT IN AEPB: 2.0000 | INHALATION_SPRAY | Freq: Two times a day (BID) | RESPIRATORY_TRACT | Status: DC

## 2015-04-28 MED ORDER — OMEPRAZOLE 40 MG PO CPDR: 40.0000 mg | DELAYED_RELEASE_CAPSULE | Freq: Every day | ORAL | Status: DC

## 2015-04-28 NOTE — Progress Notes (Signed)
   Subjective:    Patient ID: Christina Jacobs, female    DOB: 10-15-1941, 74 y.o.   MRN: [id number]  HPI  Chief Complaint  Patient presents with  . Asthma    Doing well, food bothers her in the evenings due to Hiatal hernia and GERD.  Patient still having SOB, does not use ProAir any longer.  Does not have rescue inhaler and has not felt the need to have one.   Nine-month follow-up, 74 year old never smoker with large hiatal hernia and myasthenia Her respiratory problems have been attributed to microaspiration from high level reflux disease and large hiatal hernia .Note the patient has symptoms when overeating. She also has myasthenia since 2013 - on low dose prednisone Maintained on pulmicort for asthma-she denies wheezing, has not needed her rescue inhaler. Feel that she has more restrictive lung disease-she is maintained on nocturnal oxygen  Significant tests/ events  Spirometry 09/2013 >> FEV1 43%, FVC 44% CT chest 08/2013 >> massive hiatal hernia with compressive lower lung atelectasis stable from 2013 CT abdomen 01/2015 >> unchanged hiatal hernia  Past Medical History  Diagnosis Date  . Hyperlipidemia   . Hypertension   . Hypothyroidism   . Osteoporosis     last DEXA 08-03-11  . Osteoarthritis     see's Dr. [name]  . Bursitis   . Myasthenia gravis 2013    followed at [facility] Dr Scheryl Marten, diplopia only  . Asthma     sees Dr. Lyda Jester   . Renal insufficiency     sees Dr. [name]      Review of Systems neg for any significant sore throat, dysphagia, itching, sneezing, nasal congestion or excess/ purulent secretions, fever, chills, sweats, unintended wt loss, pleuritic or exertional cp, hempoptysis, orthopnea pnd or change in chronic leg swelling. Also denies presyncope, palpitations, heartburn, abdominal pain, nausea, vomiting, diarrhea or change in bowel or urinary habits, dysuria,hematuria, rash, arthralgias, visual complaints, headache, numbness weakness or  ataxia.     Objective:   Physical Exam  Gen. Pleasant, obese, in no distress, normal affect ENT - no lesions, no post nasal drip, class 2 airway Neck: No JVD, no thyromegaly, no carotid bruits Lungs: no use of accessory muscles, no dullness to percussion, decreased without rales or rhonchi  Cardiovascular: Rhythm regular, heart sounds  normal, no murmurs or gallops, no peripheral edema Abdomen: soft and non-tender, no hepatosplenomegaly, BS normal. Musculoskeletal: No deformities, no cyanosis or clubbing Neuro:  alert, non focal, no tremors       Assessment & Plan:

## 2015-04-28 NOTE — Patient Instructions (Signed)
Refills on pulmicort  Rx for omeprazole as needed #30 x 5  Check oxygen during sleep Call as needed

## 2015-04-29 NOTE — Assessment & Plan Note (Signed)
She will attempt tapering Pulmicort to off

## 2015-04-29 NOTE — Assessment & Plan Note (Addendum)
The seems to be the predominant issue-seems to be related to large hiatal hernia rather than myasthenia.Her symptoms are mostly meal related and periodically flareup when her GI symptoms worsen Recheck ONO -she may not need nocturnal oxygen anymore

## 2015-05-12 ENCOUNTER — Telehealth: Payer: Self-pay | Admitting: Pulmonary Disease

## 2015-05-12 NOTE — Telephone Encounter (Signed)
ONO on RA seems ok OK to stop using O2 & we can send order to dc O2 if she does OK x 1 month

## 2015-05-13 NOTE — Telephone Encounter (Signed)
lmtcb

## 2015-05-16 ENCOUNTER — Telehealth: Payer: Self-pay | Admitting: Pulmonary Disease

## 2015-05-16 NOTE — Telephone Encounter (Signed)
Pt is aware of ONO results. She would like to keep oxygen at this time. Nothing further was needed.

## 2015-05-16 NOTE — Telephone Encounter (Signed)
Patient requesting copy of ONO report. It has been sent downstairs to be scanned.   Advised patient that report is in process of being scanned into her chart and I would get her a copy as soon as they scan it into her chart. Hold in my box until I can copy ONO and mail to patient.

## 2015-05-19 NOTE — Telephone Encounter (Signed)
Sent message to [facility] to obtain another copy of ONO since this copy is not in chart yet.

## 2015-05-20 ENCOUNTER — Encounter: Payer: Self-pay | Admitting: Pulmonary Disease

## 2015-05-20 NOTE — Telephone Encounter (Signed)
Has Melissa responded to you? I do not have access to your box to check on the status of this. Thanks.

## 2015-05-23 NOTE — Telephone Encounter (Signed)
Results have been scanned into chart. Printed out copy of results and mailed to patient per patient's request. Nothing further needed.

## 2015-05-25 ENCOUNTER — Telehealth: Payer: Self-pay | Admitting: Family Medicine

## 2015-05-25 DIAGNOSIS — J984 Other disorders of lung: Secondary | ICD-10-CM

## 2015-05-25 NOTE — Telephone Encounter (Signed)
She has received conflicting advice from pulmonologists in [place] and wants a third opinion from someone at [facility]

## 2015-07-04 LAB — HM MAMMOGRAPHY

## 2015-07-06 ENCOUNTER — Encounter: Payer: Self-pay | Admitting: Family Medicine

## 2015-07-28 ENCOUNTER — Encounter: Payer: Self-pay | Admitting: Gastroenterology

## 2015-08-02 ENCOUNTER — Ambulatory Visit (INDEPENDENT_AMBULATORY_CARE_PROVIDER_SITE_OTHER): Payer: Medicare HMO | Admitting: Family Medicine

## 2015-08-02 ENCOUNTER — Encounter: Payer: Self-pay | Admitting: Family Medicine

## 2015-08-02 VITALS — BP 133/88 | HR 78 | Temp 98.1°F | Ht 66.0 in | Wt 184.0 lb

## 2015-08-02 DIAGNOSIS — I10 Essential (primary) hypertension: Secondary | ICD-10-CM | POA: Diagnosis not present

## 2015-08-02 DIAGNOSIS — Z0000 Encounter for general adult medical examination without abnormal findings: Secondary | ICD-10-CM

## 2015-08-02 DIAGNOSIS — Z23 Encounter for immunization: Secondary | ICD-10-CM

## 2015-08-02 DIAGNOSIS — E785 Hyperlipidemia, unspecified: Secondary | ICD-10-CM | POA: Diagnosis not present

## 2015-08-02 LAB — CBC WITH DIFFERENTIAL/PLATELET
Basophils Absolute: 0 10*3/uL (ref 0.0–0.1)
Basophils Relative: 0.4 % (ref 0.0–3.0)
EOS PCT: 1.4 % (ref 0.0–5.0)
Eosinophils Absolute: 0.1 10*3/uL (ref 0.0–0.7)
HCT: 46 % (ref 36.0–46.0)
Hemoglobin: 15.2 g/dL — ABNORMAL HIGH (ref 12.0–15.0)
LYMPHS ABS: 2.6 10*3/uL (ref 0.7–4.0)
Lymphocytes Relative: 29.6 % (ref 12.0–46.0)
MCHC: 33 g/dL (ref 30.0–36.0)
MCV: 91.3 fl (ref 78.0–100.0)
MONOS PCT: 7.6 % (ref 3.0–12.0)
Monocytes Absolute: 0.7 10*3/uL (ref 0.1–1.0)
NEUTROS ABS: 5.3 10*3/uL (ref 1.4–7.7)
NEUTROS PCT: 61 % (ref 43.0–77.0)
PLATELETS: 172 10*3/uL (ref 150.0–400.0)
RBC: 5.04 Mil/uL (ref 3.87–5.11)
RDW: 13.4 % (ref 11.5–15.5)
WBC: 8.7 10*3/uL (ref 4.0–10.5)

## 2015-08-02 LAB — HEPATIC FUNCTION PANEL
ALK PHOS: 46 U/L (ref 39–117)
ALT: 19 U/L (ref 0–35)
AST: 23 U/L (ref 0–37)
Albumin: 4.1 g/dL (ref 3.5–5.2)
BILIRUBIN TOTAL: 1.1 mg/dL (ref 0.2–1.2)
Bilirubin, Direct: 0.2 mg/dL (ref 0.0–0.3)
Total Protein: 6.7 g/dL (ref 6.0–8.3)

## 2015-08-02 LAB — LIPID PANEL
Cholesterol: 146 mg/dL (ref 0–200)
HDL: 68.9 mg/dL (ref >39.00)
LDL Cholesterol: 57 mg/dL (ref 0–99)
NONHDL: 77.53
Total CHOL/HDL Ratio: 2
Triglycerides: 104 mg/dL (ref 0.0–149.0)
VLDL: 20.8 mg/dL (ref 0.0–40.0)

## 2015-08-02 LAB — VITAMIN D 25 HYDROXY (VIT D DEFICIENCY, FRACTURES): VITD: 40.29 ng/mL (ref 30.00–100.00)

## 2015-08-02 LAB — TSH: TSH: 0.95 u[IU]/mL (ref 0.35–4.50)

## 2015-08-02 MED ORDER — LOSARTAN POTASSIUM 50 MG PO TABS: 50.0000 mg | ORAL_TABLET | Freq: Every day | ORAL | Status: DC

## 2015-08-02 MED ORDER — TRIAMTERENE-HCTZ 37.5-25 MG PO CAPS: 1.0000 | ORAL_CAPSULE | Freq: Every day | ORAL | Status: DC

## 2015-08-02 MED ORDER — LEVOTHYROXINE SODIUM 100 MCG PO TABS: 100.0000 ug | ORAL_TABLET | Freq: Every day | ORAL | Status: DC

## 2015-08-02 MED ORDER — METOPROLOL SUCCINATE ER 50 MG PO TB24: 50.0000 mg | ORAL_TABLET | Freq: Every day | ORAL | Status: DC

## 2015-08-02 MED ORDER — ATORVASTATIN CALCIUM 10 MG PO TABS: 10.0000 mg | ORAL_TABLET | Freq: Every day | ORAL | Status: DC

## 2015-08-02 NOTE — Progress Notes (Signed)
Pre visit review using our clinic review tool, if applicable. No additional management support is needed unless otherwise documented below in the visit note. 

## 2015-08-02 NOTE — Progress Notes (Signed)
   Subjective:    Patient ID: Christina Jacobs, female    DOB: 1941/01/25, 74 y.o.   MRN: [id number]  HPI 74 yr old female for a cpx. She feels well in general. She recently saw Dr. Moshe Cipro for a follow up on kidney disease and she is doing well. Her GFR is stable at 47. She sees Dr. Burnett Harry about the myasthenia gravis, and this has been stable. She will be due for colon screening this November, but her colon is so tortuous this was impossible to get accomplished last time. She walks for exercise.    Review of Systems  Constitutional: Negative.   HENT: Negative.   Eyes: Negative.   Respiratory: Negative.   Cardiovascular: Negative.   Gastrointestinal: Negative.   Genitourinary: Negative for dysuria, urgency, frequency, hematuria, flank pain, decreased urine volume, enuresis, difficulty urinating, pelvic pain and dyspareunia.  Musculoskeletal: Negative.   Skin: Negative.   Neurological: Negative.   Psychiatric/Behavioral: Negative.        Objective:   Physical Exam  Constitutional: She is oriented to person, place, and time. She appears well-developed and well-nourished. No distress.  HENT:  Head: Normocephalic and atraumatic.  Right Ear: External ear normal.  Left Ear: External ear normal.  Nose: Nose normal.  Mouth/Throat: Oropharynx is clear and moist. No oropharyngeal exudate.  Eyes: Conjunctivae and EOM are normal. Pupils are equal, round, and reactive to light. No scleral icterus.  Neck: Normal range of motion. Neck supple. No JVD present. No thyromegaly present.  Cardiovascular: Normal rate, regular rhythm, normal heart sounds and intact distal pulses.  Exam reveals no gallop and no friction rub.   No murmur heard. EKG normal   Pulmonary/Chest: Effort normal and breath sounds normal. No respiratory distress. She has no wheezes. She has no rales. She exhibits no tenderness.  Abdominal: Soft. Bowel sounds are normal. She exhibits no distension and no mass. There is no  tenderness. There is no rebound and no guarding.  Musculoskeletal: Normal range of motion. She exhibits no edema or tenderness.  Lymphadenopathy:    She has no cervical adenopathy.  Neurological: She is alert and oriented to person, place, and time. She has normal reflexes. No cranial nerve deficit. She exhibits normal muscle tone. Coordination normal.  Skin: Skin is warm and dry. No rash noted. No erythema.  Psychiatric: She has a normal mood and affect. Her behavior is normal. Judgment and thought content normal.          Assessment & Plan:  Well exam. We discussed diet and exercise advice. Get fasting labs today. Refer to GI to discuss alternative ways to screen her colon.

## 2015-08-04 ENCOUNTER — Encounter: Payer: Self-pay | Admitting: Gastroenterology

## 2015-09-26 ENCOUNTER — Encounter: Payer: Self-pay | Admitting: Gastroenterology

## 2015-09-26 ENCOUNTER — Ambulatory Visit (INDEPENDENT_AMBULATORY_CARE_PROVIDER_SITE_OTHER): Payer: Medicare HMO | Admitting: Gastroenterology

## 2015-09-26 VITALS — BP 124/80 | HR 76 | Ht 64.75 in | Wt 184.0 lb

## 2015-09-26 DIAGNOSIS — K449 Diaphragmatic hernia without obstruction or gangrene: Secondary | ICD-10-CM

## 2015-09-26 DIAGNOSIS — Z800 Family history of malignant neoplasm of digestive organs: Secondary | ICD-10-CM | POA: Diagnosis not present

## 2015-09-26 DIAGNOSIS — Z86010 Personal history of colonic polyps: Secondary | ICD-10-CM

## 2015-09-26 DIAGNOSIS — Z860101 Personal history of adenomatous and serrated colon polyps: Secondary | ICD-10-CM

## 2015-09-26 NOTE — Progress Notes (Signed)
    History of Present Illness: This is a 74 year old female referred by Laurey Morale, MD for the evaluation of a large hiatal hernia and surveillance colonoscopy. He is accompanied by her husband. She is a former patient of Dr. Buel Ream and was evaluated by Dr. Johnathan Hausen. She underwent laparotomy in 2011 for attempted reduction of the hiatal hernia but this was not successful. She notes fullness in her chest and some shortness of breath after eating meals especially large meals and these symptoms have been stable for years she notes no dysphagia or reflux symptoms. Symptoms have not changed over the past several years. She has a history of an adenomatous polyp in 2005 and a family history of colon cancer in her sister. Her last 2 colonoscopies in 2008 and 2011 did not have precancerous polyps. Her colonoscopy in 2011 Dr. Sharlett Iles indicated difficult exam likely due to looping related to her diaphragmatic hernia. The report indicates exam to the cecum however the photos seem to show the ileocecal valve as the extent of the exam and the patient was under the impression that the exam was not complete to the cecum. Given that she was told last exam was difficult and not complete due to her diaphragmatic hernia she is reluctant to schedule colonoscopy. She has mild constipation.   IMPRESSION: 1. Large hiatal hernia, unchanged in appearance. No associated bowel obstruction or mesenteric edema. Stable appearing large hiatal hernia containing all of the stomach, large and small-bowel as well as of portion of the pancreas 2. 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 3. Status post hysterectomy, appendectomy. 4. Significant stool burden.Redundant colon. 5. Diverticulosis.  Review of Systems: Pertinent positive and negative review of systems were noted in the above HPI section. All other review of systems were otherwise negative.  Current Medications, Allergies, Past Medical History, Past Surgical History, Family  History and Social History were reviewed in [employer] record.  Physical Exam: General: Well developed, well nourished, no acute distress Head: Normocephalic and atraumatic Eyes:  sclerae anicteric, EOMI Ears: Normal auditory acuity Mouth: No deformity or lesions Neck: Supple, no masses or thyromegaly Lungs: Clear throughout to auscultation Heart: Regular rate and rhythm; no murmurs, rubs or bruits Abdomen: Soft, non tender and non distended. No masses, hepatosplenomegaly or hernias noted. Normal Bowel sounds Musculoskeletal: Symmetrical with no gross deformities  Skin: No lesions on visible extremities Pulses:  Normal pulses noted Extremities: No clubbing, cyanosis, edema or deformities noted Neurological: Alert oriented x 4, grossly nonfocal Cervical Nodes:  No significant cervical adenopathy Inguinal Nodes: No significant inguinal adenopathy Psychological:  Alert and cooperative. Normal mood and affect  Assessment and Recommendations:  1. Stable appearing large hiatal hernia containing all of the stomach, large and small-bowel as well as of portion of the pancreas. Surgical repair was not possible. Recommend eating small meals and remaining upright for 2-3 hours after meals. GI follow up as needed.  2. Personal history of adenomatous colon polyps and family history of colon cancer in her sister. Given that her last exam was difficult due to her diaphragmatic hernia and her last 2 colonoscopies did not show adenomatous polyps, the patient declines to proceed with colonoscopy and I think this is a very reasonable decision. She was offered air-contrast barium enema and FIT but states she will defer on colon cancer screening at this time. GI follow up as needed.    cc: Laurey Morale, MD [address] [place], [place] [address]

## 2015-09-26 NOTE — Patient Instructions (Signed)
We have cancelled all of your colonoscopy recalls at your request.  Follow up as needed.  Thank you for choosing me and [facility].  Pricilla Riffle. Dagoberto Ligas., MD., Marval Regal

## 2015-11-16 DIAGNOSIS — H2513 Age-related nuclear cataract, bilateral: Secondary | ICD-10-CM | POA: Diagnosis not present

## 2015-11-16 DIAGNOSIS — H40013 Open angle with borderline findings, low risk, bilateral: Secondary | ICD-10-CM | POA: Diagnosis not present

## 2015-11-16 DIAGNOSIS — H43813 Vitreous degeneration, bilateral: Secondary | ICD-10-CM | POA: Diagnosis not present

## 2015-11-16 DIAGNOSIS — H524 Presbyopia: Secondary | ICD-10-CM | POA: Diagnosis not present

## 2015-11-24 ENCOUNTER — Encounter: Payer: Self-pay | Admitting: Pulmonary Disease

## 2015-11-24 ENCOUNTER — Ambulatory Visit (INDEPENDENT_AMBULATORY_CARE_PROVIDER_SITE_OTHER): Payer: Medicare HMO | Admitting: Pulmonary Disease

## 2015-11-24 VITALS — HR 84 | Ht 64.0 in | Wt 181.0 lb

## 2015-11-24 DIAGNOSIS — M810 Age-related osteoporosis without current pathological fracture: Secondary | ICD-10-CM | POA: Diagnosis not present

## 2015-11-24 DIAGNOSIS — J4540 Moderate persistent asthma, uncomplicated: Secondary | ICD-10-CM

## 2015-11-24 MED ORDER — OMEPRAZOLE 40 MG PO CPDR: 40.0000 mg | DELAYED_RELEASE_CAPSULE | Freq: Every day | ORAL | Status: DC

## 2015-11-24 MED ORDER — BUDESONIDE 180 MCG/ACT IN AEPB: 2.0000 | INHALATION_SPRAY | Freq: Two times a day (BID) | RESPIRATORY_TRACT | Status: DC

## 2015-11-24 NOTE — Progress Notes (Signed)
   Subjective:    Patient ID: Christina Jacobs, female    DOB: 1941/01/25, 75 y.o.   MRN: [id number]  HPI   75 year old never smoker with large hiatal hernia and myasthenia Her respiratory problems have been attributed to microaspiration from high level reflux disease and large hiatal hernia . 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 Follow-up    Pt is here for 7 month follow up. Pt states her breathing is doing well and has no new complaints at this time.    51m follow-up, accompanied by husband   Not using O2 -feels ok Came off pulmicort but wheezing got worse >> back on it Stopped omez but felt worse, now back on it  They have numerous questions  Significant tests/ events  Spirometry 09/2013 >> FEV1 43%, FVC 44% CT chest 08/2013 >> massive hiatal hernia with compressive lower lung atelectasis stable from 2013 CT abdomen 01/2015 >> unchanged hiatal hernia 05/2015 ONO on RA - 20 min desatn  Review of Systems neg for any significant sore throat, dysphagia, itching, sneezing, nasal congestion or excess/ purulent secretions, fever, chills, sweats, unintended wt loss, pleuritic or exertional cp, hempoptysis, orthopnea pnd or change in chronic leg swelling. Also denies presyncope, palpitations, heartburn, abdominal pain, nausea, vomiting, diarrhea or change in bowel or urinary habits, dysuria,hematuria, rash, arthralgias, visual complaints, headache, numbness weakness or ataxia.     Objective:   Physical Exam   Gen. Pleasant, well-nourished, in no distress, normal affect ENT - no lesions, no post nasal drip Neck: No JVD, no thyromegaly, no carotid bruits Lungs: no use of accessory muscles, no  dullness to percussion, clear without rales or rhonchi  Cardiovascular: Rhythm regular, heart sounds  normal, no murmurs or gallops, no peripheral edema Abdomen: soft and non-tender, no hepatosplenomegaly, BS normal. Musculoskeletal: No deformities, no cyanosis or clubbing Neuro:  alert, non focal        Assessment & Plan:

## 2015-11-24 NOTE — Assessment & Plan Note (Addendum)
3 refills on 90 d supply of pulmicort & omeprazole OK to stay off O2 Call as needed We discussed reasons for reactive airway disease -feel that the large hiatal hernia and reflux (perhaps nonacid reflux ) is mostly responsible and doubt true asthma, but she does have response to Pulmicort and hence we'll keep on this

## 2015-11-24 NOTE — Patient Instructions (Signed)
3 refills on 90 d supply of pulmicort & omeprazole OK to stay off O2 Call as needed We discussed reasons for reactive airway disease

## 2015-11-25 NOTE — Assessment & Plan Note (Signed)
Suggest calcium and vitamin D supplements Defer to PCP about other therapies

## 2015-12-01 DIAGNOSIS — H25812 Combined forms of age-related cataract, left eye: Secondary | ICD-10-CM | POA: Diagnosis not present

## 2015-12-01 DIAGNOSIS — H268 Other specified cataract: Secondary | ICD-10-CM | POA: Diagnosis not present

## 2015-12-01 DIAGNOSIS — H2512 Age-related nuclear cataract, left eye: Secondary | ICD-10-CM | POA: Diagnosis not present

## 2016-01-19 DIAGNOSIS — H25041 Posterior subcapsular polar age-related cataract, right eye: Secondary | ICD-10-CM | POA: Diagnosis not present

## 2016-01-19 DIAGNOSIS — H25811 Combined forms of age-related cataract, right eye: Secondary | ICD-10-CM | POA: Diagnosis not present

## 2016-01-19 DIAGNOSIS — H2511 Age-related nuclear cataract, right eye: Secondary | ICD-10-CM | POA: Diagnosis not present

## 2016-02-07 DIAGNOSIS — Z85828 Personal history of other malignant neoplasm of skin: Secondary | ICD-10-CM | POA: Diagnosis not present

## 2016-02-07 DIAGNOSIS — D2361 Other benign neoplasm of skin of right upper limb, including shoulder: Secondary | ICD-10-CM | POA: Diagnosis not present

## 2016-02-07 DIAGNOSIS — D2360 Other benign neoplasm of skin of unspecified upper limb, including shoulder: Secondary | ICD-10-CM | POA: Diagnosis not present

## 2016-02-07 DIAGNOSIS — D3612 Benign neoplasm of peripheral nerves and autonomic nervous system, upper limb, including shoulder: Secondary | ICD-10-CM | POA: Diagnosis not present

## 2016-02-07 DIAGNOSIS — D485 Neoplasm of uncertain behavior of skin: Secondary | ICD-10-CM | POA: Diagnosis not present

## 2016-02-07 DIAGNOSIS — L821 Other seborrheic keratosis: Secondary | ICD-10-CM | POA: Diagnosis not present

## 2016-02-21 ENCOUNTER — Ambulatory Visit (INDEPENDENT_AMBULATORY_CARE_PROVIDER_SITE_OTHER): Payer: Medicare HMO | Admitting: Family Medicine

## 2016-02-21 ENCOUNTER — Encounter: Payer: Self-pay | Admitting: Family Medicine

## 2016-02-21 VITALS — BP 128/105 | HR 96 | Temp 98.6°F | Ht 64.0 in | Wt 178.0 lb

## 2016-02-21 DIAGNOSIS — J40 Bronchitis, not specified as acute or chronic: Secondary | ICD-10-CM

## 2016-02-21 MED ORDER — AMOXICILLIN-POT CLAVULANATE 875-125 MG PO TABS: 1.0000 | ORAL_TABLET | Freq: Two times a day (BID) | ORAL | Status: DC

## 2016-02-21 NOTE — Progress Notes (Signed)
   Subjective:    Patient ID: Christina Jacobs, female    DOB: 1941-02-23, 75 y.o.   MRN: [id number]  HPI Here for 5 days of chest congestion and coughing up yellow sputum. No fever. Using her inhalers and Mucinex DM.    Review of Systems  Constitutional: Negative.   HENT: Positive for congestion and sinus pressure. Negative for postnasal drip and sore throat.   Eyes: Negative.   Respiratory: Positive for cough, chest tightness, shortness of breath and wheezing.   Cardiovascular: Negative.        Objective:   Physical Exam  Constitutional: She appears well-developed and well-nourished. No distress.  HENT:  Right Ear: External ear normal.  Left Ear: External ear normal.  Nose: Nose normal.  Mouth/Throat: Oropharynx is clear and moist.  Eyes: Conjunctivae are normal.  Neck: No thyromegaly present.  Cardiovascular: Normal rate, regular rhythm, normal heart sounds and intact distal pulses.   Pulmonary/Chest: Effort normal. No respiratory distress. She has no rales.  Scattered rhonchi and wheezes   Lymphadenopathy:    She has no cervical adenopathy.          Assessment & Plan:  Bronchitis, treated with Augmentin.  Laurey Morale, MD

## 2016-02-21 NOTE — Progress Notes (Signed)
Pre visit review using our clinic review tool, if applicable. No additional management support is needed unless otherwise documented below in the visit note. 

## 2016-03-09 ENCOUNTER — Telehealth: Payer: Self-pay | Admitting: Family Medicine

## 2016-03-09 NOTE — Telephone Encounter (Signed)
The form is ready  

## 2016-03-09 NOTE — Telephone Encounter (Signed)
I left a voice message, form is ready for pick up.

## 2016-03-09 NOTE — Telephone Encounter (Signed)
Pt would like to have a handicap placard.  Due to the husband being in the hospital and scared of falling when walking a distance alone to get to the hospital.

## 2016-04-30 ENCOUNTER — Other Ambulatory Visit: Payer: Self-pay | Admitting: Pulmonary Disease

## 2016-05-09 DIAGNOSIS — Z79899 Other long term (current) drug therapy: Secondary | ICD-10-CM | POA: Diagnosis not present

## 2016-05-09 DIAGNOSIS — G7000 Myasthenia gravis without (acute) exacerbation: Secondary | ICD-10-CM | POA: Diagnosis not present

## 2016-05-30 ENCOUNTER — Other Ambulatory Visit: Payer: Self-pay | Admitting: Pulmonary Disease

## 2016-06-08 DIAGNOSIS — H401122 Primary open-angle glaucoma, left eye, moderate stage: Secondary | ICD-10-CM | POA: Diagnosis not present

## 2016-06-08 DIAGNOSIS — H401112 Primary open-angle glaucoma, right eye, moderate stage: Secondary | ICD-10-CM | POA: Diagnosis not present

## 2016-07-05 DIAGNOSIS — N289 Disorder of kidney and ureter, unspecified: Secondary | ICD-10-CM | POA: Diagnosis not present

## 2016-07-11 DIAGNOSIS — N183 Chronic kidney disease, stage 3 (moderate): Secondary | ICD-10-CM | POA: Diagnosis not present

## 2016-07-11 DIAGNOSIS — Z6829 Body mass index (BMI) 29.0-29.9, adult: Secondary | ICD-10-CM | POA: Diagnosis not present

## 2016-07-11 DIAGNOSIS — N390 Urinary tract infection, site not specified: Secondary | ICD-10-CM | POA: Diagnosis not present

## 2016-07-11 DIAGNOSIS — I10 Essential (primary) hypertension: Secondary | ICD-10-CM | POA: Diagnosis not present

## 2016-07-11 DIAGNOSIS — E039 Hypothyroidism, unspecified: Secondary | ICD-10-CM | POA: Diagnosis not present

## 2016-07-17 ENCOUNTER — Other Ambulatory Visit: Payer: Self-pay | Admitting: Pulmonary Disease

## 2016-07-20 DIAGNOSIS — H401112 Primary open-angle glaucoma, right eye, moderate stage: Secondary | ICD-10-CM | POA: Diagnosis not present

## 2016-07-20 DIAGNOSIS — H401122 Primary open-angle glaucoma, left eye, moderate stage: Secondary | ICD-10-CM | POA: Diagnosis not present

## 2016-07-31 ENCOUNTER — Encounter: Payer: Self-pay | Admitting: Family Medicine

## 2016-07-31 DIAGNOSIS — Z1231 Encounter for screening mammogram for malignant neoplasm of breast: Secondary | ICD-10-CM | POA: Diagnosis not present

## 2016-08-01 ENCOUNTER — Other Ambulatory Visit (INDEPENDENT_AMBULATORY_CARE_PROVIDER_SITE_OTHER): Payer: Medicare HMO

## 2016-08-01 DIAGNOSIS — Z0000 Encounter for general adult medical examination without abnormal findings: Secondary | ICD-10-CM

## 2016-08-01 LAB — HEPATIC FUNCTION PANEL
ALT: 17 U/L (ref 0–35)
AST: 18 U/L (ref 0–37)
Albumin: 3.7 g/dL (ref 3.5–5.2)
Alkaline Phosphatase: 45 U/L (ref 39–117)
BILIRUBIN DIRECT: 0.2 mg/dL (ref 0.0–0.3)
BILIRUBIN TOTAL: 0.8 mg/dL (ref 0.2–1.2)
Total Protein: 6.2 g/dL (ref 6.0–8.3)

## 2016-08-01 LAB — CBC WITH DIFFERENTIAL/PLATELET
BASOS PCT: 0.6 % (ref 0.0–3.0)
Basophils Absolute: 0 10*3/uL (ref 0.0–0.1)
EOS ABS: 0.1 10*3/uL (ref 0.0–0.7)
Eosinophils Relative: 1.2 % (ref 0.0–5.0)
HCT: 42.7 % (ref 36.0–46.0)
HEMOGLOBIN: 14.4 g/dL (ref 12.0–15.0)
Lymphocytes Relative: 36.1 % (ref 12.0–46.0)
Lymphs Abs: 2.8 10*3/uL (ref 0.7–4.0)
MCHC: 33.8 g/dL (ref 30.0–36.0)
MCV: 89.8 fl (ref 78.0–100.0)
MONO ABS: 0.6 10*3/uL (ref 0.1–1.0)
Monocytes Relative: 8.3 % (ref 3.0–12.0)
Neutro Abs: 4.2 10*3/uL (ref 1.4–7.7)
Neutrophils Relative %: 53.8 % (ref 43.0–77.0)
Platelets: 167 10*3/uL (ref 150.0–400.0)
RBC: 4.76 Mil/uL (ref 3.87–5.11)
RDW: 13.9 % (ref 11.5–15.5)
WBC: 7.8 10*3/uL (ref 4.0–10.5)

## 2016-08-01 LAB — BASIC METABOLIC PANEL
BUN: 25 mg/dL — AB (ref 6–23)
CHLORIDE: 104 meq/L (ref 96–112)
CO2: 31 mEq/L (ref 19–32)
CREATININE: 1.34 mg/dL — AB (ref 0.40–1.20)
Calcium: 9.3 mg/dL (ref 8.4–10.5)
GFR: 40.95 mL/min — AB (ref >60.00)
Glucose, Bld: 86 mg/dL (ref 70–99)
Potassium: 4.1 mEq/L (ref 3.5–5.1)
Sodium: 142 mEq/L (ref 135–145)

## 2016-08-01 LAB — LIPID PANEL
Cholesterol: 156 mg/dL (ref 0–200)
HDL: 64.8 mg/dL (ref >39.00)
LDL CALC: 71 mg/dL (ref 0–99)
NonHDL: 91.29
TRIGLYCERIDES: 100 mg/dL (ref 0.0–149.0)
Total CHOL/HDL Ratio: 2
VLDL: 20 mg/dL (ref 0.0–40.0)

## 2016-08-01 LAB — TSH: TSH: 0.64 u[IU]/mL (ref 0.35–4.50)

## 2016-08-08 ENCOUNTER — Ambulatory Visit (INDEPENDENT_AMBULATORY_CARE_PROVIDER_SITE_OTHER): Payer: Medicare HMO | Admitting: Family Medicine

## 2016-08-08 ENCOUNTER — Encounter: Payer: Self-pay | Admitting: Family Medicine

## 2016-08-08 VITALS — BP 129/96 | HR 86 | Temp 97.9°F | Ht 64.0 in | Wt 178.0 lb

## 2016-08-08 DIAGNOSIS — E785 Hyperlipidemia, unspecified: Secondary | ICD-10-CM

## 2016-08-08 DIAGNOSIS — Z0000 Encounter for general adult medical examination without abnormal findings: Secondary | ICD-10-CM

## 2016-08-08 DIAGNOSIS — Z23 Encounter for immunization: Secondary | ICD-10-CM

## 2016-08-08 DIAGNOSIS — H409 Unspecified glaucoma: Secondary | ICD-10-CM

## 2016-08-08 MED ORDER — TRIAMTERENE-HCTZ 37.5-25 MG PO CAPS: 1.0000 | ORAL_CAPSULE | Freq: Every day | ORAL | 3 refills | Status: DC

## 2016-08-08 MED ORDER — LEVOTHYROXINE SODIUM 100 MCG PO TABS: 100.0000 ug | ORAL_TABLET | Freq: Every day | ORAL | 3 refills | Status: DC

## 2016-08-08 MED ORDER — ATORVASTATIN CALCIUM 10 MG PO TABS: 10.0000 mg | ORAL_TABLET | Freq: Every day | ORAL | 3 refills | Status: DC

## 2016-08-08 MED ORDER — METOPROLOL SUCCINATE ER 100 MG PO TB24: 100.0000 mg | ORAL_TABLET | Freq: Every day | ORAL | 3 refills | Status: DC

## 2016-08-08 MED ORDER — BIMATOPROST 0.01 % OP SOLN: 1.0000 [drp] | Freq: Every day | OPHTHALMIC | 0 refills | Status: DC

## 2016-08-08 NOTE — Progress Notes (Signed)
   Subjective:    Patient ID: Christina Jacobs, female    DOB: 06-May-1941, 75 y.o.   MRN: [id number]  HPI 75 yr old female for a well exam. She feels well  Except she does mention bloating after a meal. Her typical diet pattern is to skip breakfast and to skip lunch, and then to eat a large supper. She feels fine during the day, but immediately after eating supper she feels bloated and her abdomen is distended. No nausea or vomiting. Her BMs are regular. She does have a known large hiatal hernia. She was recently diagnosed with glaucoma and is treated with eye drops. She saw Dr. Clover Mealy last month and her renal function is stable. We again noted some mild but stable renal deficiency. She drinks plenty of water and she avoids NSAIDs.    Review of Systems  Constitutional: Negative.   HENT: Negative.   Eyes: Negative.   Respiratory: Negative.   Cardiovascular: Negative.   Gastrointestinal: Positive for abdominal distention. Negative for abdominal pain, anal bleeding, blood in stool, constipation, diarrhea, nausea, rectal pain and vomiting.  Genitourinary: Negative for decreased urine volume, difficulty urinating, dyspareunia, dysuria, enuresis, flank pain, frequency, hematuria, pelvic pain and urgency.  Musculoskeletal: Negative.   Skin: Negative.   Neurological: Negative.   Psychiatric/Behavioral: Negative.        Objective:   Physical Exam  Constitutional: She is oriented to person, place, and time. She appears well-developed and well-nourished. No distress.  HENT:  Head: Normocephalic and atraumatic.  Right Ear: External ear normal.  Left Ear: External ear normal.  Nose: Nose normal.  Mouth/Throat: Oropharynx is clear and moist. No oropharyngeal exudate.  Eyes: Conjunctivae and EOM are normal. Pupils are equal, round, and reactive to light. No scleral icterus.  Neck: Normal range of motion. Neck supple. No JVD present. No thyromegaly present.  Cardiovascular: Normal rate, regular  rhythm, normal heart sounds and intact distal pulses.  Exam reveals no gallop and no friction rub.   No murmur heard. EKG normal  Pulmonary/Chest: Effort normal and breath sounds normal. No respiratory distress. She has no wheezes. She has no rales. She exhibits no tenderness.  Abdominal: Soft. Bowel sounds are normal. She exhibits no distension and no mass. There is no tenderness. There is no rebound and no guarding.  Musculoskeletal: Normal range of motion. She exhibits no edema or tenderness.  Lymphadenopathy:    She has no cervical adenopathy.  Neurological: She is alert and oriented to person, place, and time. She has normal reflexes. No cranial nerve deficit. She exhibits normal muscle tone. Coordination normal.  Skin: Skin is warm and dry. No rash noted. No erythema.  Psychiatric: She has a normal mood and affect. Her behavior is normal. Judgment and thought content normal.          Assessment & Plan:  Well exam. We discussed diet and exercise. Her evening bloating is likely the result of eating a large meal which then irritates her hiatal hernia. I advised her to eat more frequent but smaller meals. Her renal function is stable but we decided to avoid all ACE inhibitors or ARBs, so we will stop the Losartan. Instead we will increase the metoprolol succinate to 100 mg daily. Recheck in 4 weeks.

## 2016-08-08 NOTE — Progress Notes (Signed)
Pre visit review using our clinic review tool, if applicable. No additional management support is needed unless otherwise documented below in the visit note. 

## 2016-08-14 ENCOUNTER — Other Ambulatory Visit: Payer: Self-pay | Admitting: Pulmonary Disease

## 2016-08-29 DIAGNOSIS — H401132 Primary open-angle glaucoma, bilateral, moderate stage: Secondary | ICD-10-CM | POA: Diagnosis not present

## 2016-09-10 ENCOUNTER — Ambulatory Visit (INDEPENDENT_AMBULATORY_CARE_PROVIDER_SITE_OTHER): Payer: Medicare HMO | Admitting: Family Medicine

## 2016-09-10 ENCOUNTER — Encounter: Payer: Self-pay | Admitting: Family Medicine

## 2016-09-10 VITALS — BP 152/101 | HR 78 | Temp 98.3°F | Ht 64.0 in | Wt 179.0 lb

## 2016-09-10 DIAGNOSIS — K219 Gastro-esophageal reflux disease without esophagitis: Secondary | ICD-10-CM | POA: Diagnosis not present

## 2016-09-10 DIAGNOSIS — N289 Disorder of kidney and ureter, unspecified: Secondary | ICD-10-CM | POA: Diagnosis not present

## 2016-09-10 DIAGNOSIS — I10 Essential (primary) hypertension: Secondary | ICD-10-CM | POA: Diagnosis not present

## 2016-09-10 MED ORDER — METOPROLOL SUCCINATE ER 100 MG PO TB24: 100.0000 mg | ORAL_TABLET | Freq: Every day | ORAL | 3 refills | Status: DC

## 2016-09-10 NOTE — Progress Notes (Signed)
Pre visit review using our clinic review tool, if applicable. No additional management support is needed unless otherwise documented below in the visit note. 

## 2016-09-10 NOTE — Progress Notes (Signed)
   Subjective:    Patient ID: Christina Jacobs, female    DOB: 11-18-1940, 75 y.o.   MRN: [id number]  HPI Here to follow up on HTN and to ask about GERD symptoms. She has been taking Omperazole every morning before breakfast for years and she never has GERD during the day. However she has been having frequent heartburn during the night. One month ago we stopped her Losartan to protect her kidneys and we increased the Metoprolol to 100 mg daily. Since then her BP at home has been steady in the 0000000 range systolic over [id number].    Review of Systems  Constitutional: Negative.   Respiratory: Negative.   Cardiovascular: Negative.   Gastrointestinal: Negative.   Neurological: Negative.        Objective:   Physical Exam  Constitutional: She appears well-developed and well-nourished.  Neck: No thyromegaly present.  Cardiovascular: Normal rate, regular rhythm, normal heart sounds and intact distal pulses.   Pulmonary/Chest: Effort normal and breath sounds normal.  Abdominal: Soft. Bowel sounds are normal. She exhibits no distension and no mass. There is no tenderness. There is no rebound and no guarding.  Musculoskeletal: She exhibits no edema.  Lymphadenopathy:    She has no cervical adenopathy.          Assessment & Plan:  Her HTN is stable on the current regimen. She will return for a BMET in 2 months to check her renal function. She will change the Omeprazoel to take in the evenings before dinner.  Laurey Morale, MD

## 2016-09-24 DIAGNOSIS — D0461 Carcinoma in situ of skin of right upper limb, including shoulder: Secondary | ICD-10-CM | POA: Diagnosis not present

## 2016-09-24 DIAGNOSIS — D485 Neoplasm of uncertain behavior of skin: Secondary | ICD-10-CM | POA: Diagnosis not present

## 2016-09-24 DIAGNOSIS — L570 Actinic keratosis: Secondary | ICD-10-CM | POA: Diagnosis not present

## 2016-09-24 DIAGNOSIS — Z85828 Personal history of other malignant neoplasm of skin: Secondary | ICD-10-CM | POA: Diagnosis not present

## 2016-10-10 ENCOUNTER — Telehealth: Payer: Self-pay | Admitting: Pulmonary Disease

## 2016-10-10 ENCOUNTER — Ambulatory Visit (INDEPENDENT_AMBULATORY_CARE_PROVIDER_SITE_OTHER): Payer: Medicare HMO | Admitting: Pulmonary Disease

## 2016-10-10 ENCOUNTER — Ambulatory Visit (INDEPENDENT_AMBULATORY_CARE_PROVIDER_SITE_OTHER)
Admission: RE | Admit: 2016-10-10 | Discharge: 2016-10-10 | Disposition: A | Discharge status: Home / Self Care | Payer: Medicare HMO | Source: Ambulatory Visit | Attending: Pulmonary Disease | Admitting: Pulmonary Disease

## 2016-10-10 ENCOUNTER — Encounter: Payer: Self-pay | Admitting: Pulmonary Disease

## 2016-10-10 VITALS — BP 130/82 | HR 72 | Ht 66.0 in | Wt 178.0 lb

## 2016-10-10 DIAGNOSIS — J4540 Moderate persistent asthma, uncomplicated: Secondary | ICD-10-CM | POA: Diagnosis not present

## 2016-10-10 DIAGNOSIS — K449 Diaphragmatic hernia without obstruction or gangrene: Secondary | ICD-10-CM

## 2016-10-10 DIAGNOSIS — K219 Gastro-esophageal reflux disease without esophagitis: Secondary | ICD-10-CM | POA: Diagnosis not present

## 2016-10-10 DIAGNOSIS — R0602 Shortness of breath: Secondary | ICD-10-CM | POA: Diagnosis not present

## 2016-10-10 DIAGNOSIS — J9811 Atelectasis: Secondary | ICD-10-CM | POA: Diagnosis not present

## 2016-10-10 MED ORDER — FLUTICASONE FUROATE-VILANTEROL 100-25 MCG/INH IN AEPB: 1.0000 | INHALATION_SPRAY | Freq: Every day | RESPIRATORY_TRACT | 5 refills | Status: DC

## 2016-10-10 NOTE — Telephone Encounter (Signed)
Pt seen by Dr Halford Chessman today in clinic and has requested to switch care from Dr Elsworth Soho to Dr Halford Chessman.  Please advise Dr Elsworth Soho if this is okay. Thanks.

## 2016-10-10 NOTE — Patient Instructions (Signed)
Breo one puff daily >> rinse mouth after each use Stop using pulmicort while using Breo Chest xray today Will schedule pulmonary function test  Follow up in 4 weeks with Dr. Halford Chessman or Nurse Practitioner

## 2016-10-10 NOTE — Progress Notes (Signed)
Current Outpatient Prescriptions on File Prior to Visit  Medication Sig  . aspirin 81 MG tablet Take 81 mg by mouth daily.  Marland Kitchen atorvastatin (LIPITOR) 10 MG tablet Take 1 tablet (10 mg total) by mouth daily.  . bimatoprost (LUMIGAN) 0.01 % SOLN Place 1 drop into both eyes at bedtime.  Marland Kitchen levothyroxine (SYNTHROID, LEVOTHROID) 100 MCG tablet Take 1 tablet (100 mcg total) by mouth daily.  . metoprolol succinate (TOPROL-XL) 100 MG 24 hr tablet Take 1 tablet (100 mg total) by mouth daily. Take with or immediately following a meal.  . multivitamin-iron-minerals-folic acid (CENTRUM) chewable tablet Chew 1 tablet by mouth daily.  Marland Kitchen omeprazole (PRILOSEC) 40 MG capsule TAKE 1 CAPSULE (40 MG TOTAL) BY MOUTH DAILY.  Marland Kitchen predniSONE (DELTASONE) 1 MG tablet Take 4 mg by mouth daily.  . psyllium (METAMUCIL) 58.6 % powder Take 1 packet by mouth daily.  Marland Kitchen triamterene-hydrochlorothiazide (DYAZIDE) 37.5-25 MG capsule Take 1 each (1 capsule total) by mouth daily.   No current facility-administered medications on file prior to visit.      Chief Complaint  Patient presents with  . Follow-up    Pt c/o SOB x 4 months. Pt states that she has been using PPI to help with possible reflux contributing. Pt states that the SOB is worse after eating.     Past medical history Glaucoma, HLD, HTN, Hypothyroidism, OA, CKD  Past surgical history, Family history, Social history, Allergies all reviewed.  Vital Signs BP 130/82 (BP Location: Left Arm, Cuff Size: Normal)   Pulse 72   Ht 5\' 6"  (1.676 m)   Wt 178 lb (80.7 kg)   SpO2 95%   BMI 28.73 kg/m   History of Present Illness Christina Jacobs is a 75 y.o. female with asthma, hiatal hernia and myasthenia gravis.  She is followed by Dr. Elsworth Soho.  She has noticed more trouble with her breathing over the past several months.  She this happens especially after her evening meal.  She gets bloated and feels like she needs to belch.  She gets a cough and chest tightness.  She  will also having wheezing.  She has been using pulmicort, and this helps some.  She doesn't always bring up sputum, and this is usually clear.  She has not had fever, diarrhea, or leg swelling.  She does not feel like she has an issue with her swallowing, and is not aware of reflux symptoms.  She is followed by neurology for Myasthenia, but this has mostly caused only ocular symptoms and she never had respiratory symptoms related to this.  She has been taking omeprazole for reflux.  She was seen by GI years ago for her hiatal hernia and told that surgery for this would be too risky due to the location of her hernia.  She was previously seen by Dr. Fuller Plan.  Physical Exam  General - No distress ENT - No sinus tenderness, no oral exudate, no LAN Cardiac - s1s2 regular, no murmur Chest - coarse breath sounds, no wheeze Back - No focal tenderness Abd - Soft, non-tender Ext - No edema Neuro - Normal strength Skin - No rashes Psych - normal mood, and behavior   CMP Latest Ref Rng & Units 08/01/2016 08/02/2015 01/05/2015  Glucose 70 - 99 mg/dL 86 - 115(H)  BUN 6 - 23 mg/dL 25(H) - 18  Creatinine 0.40 - 1.20 mg/dL 1.34(H) - 1.20  Sodium 135 - 145 mEq/L 142 - 138  Potassium 3.5 - 5.1 mEq/L 4.1 -  3.8  Chloride 96 - 112 mEq/L 104 - 103  CO2 19 - 32 mEq/L 31 - 30  Calcium 8.4 - 10.5 mg/dL 9.3 - 9.9  Total Protein 6.0 - 8.3 g/dL 6.2 6.7 -  Total Bilirubin 0.2 - 1.2 mg/dL 0.8 1.1 -  Alkaline Phos 39 - 117 U/L 45 46 -  AST 0 - 37 U/L 18 23 -  ALT 0 - 35 U/L 17 19 -    CBC Latest Ref Rng & Units 08/01/2016 08/02/2015 01/05/2015  WBC 4.0 - 10.5 K/uL 7.8 8.7 7.7  Hemoglobin 12.0 - 15.0 g/dL 14.4 15.2(H) 14.4  Hematocrit 36.0 - 46.0 % 42.7 46.0 42.7  Platelets 150.0 - 400.0 K/uL 167.0 172.0 185.0    Assessment/Plan  Dyspnea with asthma. - will arrange for CXR and PFT - change to breo and continue prn albuterol  Hiatal hernia with concern for reflux. - continue omeprazole - might need f/u with Dr.  Fuller Plan  Myasthenia Dawayne Cirri. - I don't think this is contributing to her current symptoms - continue current dose of prednisone per neurology   Patient Instructions  Breo one puff daily >> rinse mouth after each use Stop using pulmicort while using Breo Chest xray today Will schedule pulmonary function test  Follow up in 4 weeks with Dr. Halford Chessman or Nurse Practitioner    Chesley Mires, MD [facility] Pager:  [phone] 10/10/2016, 2:55 PM

## 2016-10-15 ENCOUNTER — Telehealth: Payer: Self-pay | Admitting: Pulmonary Disease

## 2016-10-15 DIAGNOSIS — H401122 Primary open-angle glaucoma, left eye, moderate stage: Secondary | ICD-10-CM | POA: Diagnosis not present

## 2016-10-15 DIAGNOSIS — H401112 Primary open-angle glaucoma, right eye, moderate stage: Secondary | ICD-10-CM | POA: Diagnosis not present

## 2016-10-15 NOTE — Telephone Encounter (Signed)
CXR 10/10/16 >> progression of HH   Spoke with pt.  Explained that [facility] has progressed.  Advised her to follow through with surgical assessment at [facility] >> explained she is not committed to any surgery, but would be best to gather all information.  Advised her to call back if she needs help rescheduling appointment with surgery at [facility].

## 2016-10-16 ENCOUNTER — Encounter: Payer: Self-pay | Admitting: Pulmonary Disease

## 2016-10-16 NOTE — Telephone Encounter (Signed)
Pt is scheduled for f/u with VS on 11-07-16 with a PFT piror, pt would like to know if she should keep these appointments after speaking with VS.  VS please advise. Thanks

## 2016-10-17 ENCOUNTER — Telehealth: Payer: Self-pay | Admitting: Pulmonary Disease

## 2016-10-17 MED ORDER — FLUTICASONE FUROATE-VILANTEROL 100-25 MCG/INH IN AEPB: 1.0000 | INHALATION_SPRAY | Freq: Every day | RESPIRATORY_TRACT | 0 refills | Status: AC

## 2016-10-17 NOTE — Telephone Encounter (Signed)
ATC-no vm set up W[facility]  Sample left up front to be picked up.

## 2016-10-19 NOTE — Telephone Encounter (Signed)
Spoke with pt. She is aware that her samples are ready to be picked up. Nothing further was needed.

## 2016-10-22 ENCOUNTER — Other Ambulatory Visit: Payer: Self-pay | Admitting: Pulmonary Disease

## 2016-10-22 MED ORDER — OMEPRAZOLE 40 MG PO CPDR: 40.0000 mg | DELAYED_RELEASE_CAPSULE | Freq: Every day | ORAL | 1 refills | Status: DC

## 2016-10-22 NOTE — Telephone Encounter (Signed)
OK with me.

## 2016-10-26 NOTE — Telephone Encounter (Signed)
Dr. Halford Chessman are you okay with this switch? Thanks.

## 2016-10-31 ENCOUNTER — Other Ambulatory Visit (INDEPENDENT_AMBULATORY_CARE_PROVIDER_SITE_OTHER): Payer: Medicare HMO

## 2016-10-31 DIAGNOSIS — N289 Disorder of kidney and ureter, unspecified: Secondary | ICD-10-CM

## 2016-10-31 LAB — BASIC METABOLIC PANEL
BUN: 24 mg/dL — ABNORMAL HIGH (ref 6–23)
CALCIUM: 8.9 mg/dL (ref 8.4–10.5)
CO2: 30 mEq/L (ref 19–32)
CREATININE: 1.17 mg/dL (ref 0.40–1.20)
Chloride: 103 mEq/L (ref 96–112)
GFR: 47.86 mL/min — ABNORMAL LOW (ref >60.00)
GLUCOSE: 93 mg/dL (ref 70–99)
Potassium: 3.3 mEq/L — ABNORMAL LOW (ref 3.5–5.1)
Sodium: 141 mEq/L (ref 135–145)

## 2016-10-31 NOTE — Telephone Encounter (Signed)
Pt aware of below message. Pt states she is scheduled for a f/u with TP on 11/08/15. Pt states she will schedule an appointment with VS after her f/u with TP. Nothing further needed.

## 2016-10-31 NOTE — Telephone Encounter (Signed)
I am okay with switch. 

## 2016-11-02 ENCOUNTER — Other Ambulatory Visit: Payer: Self-pay | Admitting: Family Medicine

## 2016-11-02 MED ORDER — POTASSIUM CHLORIDE ER 10 MEQ PO TBCR: 10.0000 meq | EXTENDED_RELEASE_TABLET | Freq: Every day | ORAL | 3 refills | Status: DC

## 2016-11-07 ENCOUNTER — Telehealth: Payer: Self-pay

## 2016-11-07 ENCOUNTER — Ambulatory Visit (HOSPITAL_COMMUNITY)
Admission: RE | Admit: 2016-11-07 | Discharge: 2016-11-07 | Disposition: A | Discharge status: Home / Self Care | Payer: Medicare HMO | Source: Ambulatory Visit | Attending: Pulmonary Disease | Admitting: Pulmonary Disease

## 2016-11-07 ENCOUNTER — Ambulatory Visit (INDEPENDENT_AMBULATORY_CARE_PROVIDER_SITE_OTHER): Payer: Medicare HMO | Admitting: Adult Health

## 2016-11-07 ENCOUNTER — Encounter: Payer: Self-pay | Admitting: Adult Health

## 2016-11-07 VITALS — BP 122/90 | HR 90 | Temp 98.1°F | Ht 66.0 in | Wt 175.6 lb

## 2016-11-07 DIAGNOSIS — J984 Other disorders of lung: Secondary | ICD-10-CM | POA: Diagnosis not present

## 2016-11-07 DIAGNOSIS — J45909 Unspecified asthma, uncomplicated: Secondary | ICD-10-CM

## 2016-11-07 DIAGNOSIS — K219 Gastro-esophageal reflux disease without esophagitis: Secondary | ICD-10-CM | POA: Diagnosis not present

## 2016-11-07 DIAGNOSIS — R0602 Shortness of breath: Secondary | ICD-10-CM | POA: Insufficient documentation

## 2016-11-07 DIAGNOSIS — J4530 Mild persistent asthma, uncomplicated: Secondary | ICD-10-CM

## 2016-11-07 LAB — PULMONARY FUNCTION TEST
DL/VA % pred: 80 %
DL/VA: 4.05 ml/min/mmHg/L
DLCO unc % pred: 46 %
DLCO unc: 12.62 ml/min/mmHg
FEF 25-75 PRE: 1.43 L/s
FEF 25-75 Post: 1.08 L/sec
FEF2575-%CHANGE-POST: -24 %
FEF2575-%PRED-PRE: 81 %
FEF2575-%Pred-Post: 61 %
FEV1-%Change-Post: -6 %
FEV1-%PRED-POST: 64 %
FEV1-%PRED-PRE: 69 %
FEV1-POST: 1.48 L
FEV1-PRE: 1.58 L
FEV1FVC-%CHANGE-POST: 5 %
FEV1FVC-%Pred-Pre: 106 %
FEV6-%CHANGE-POST: -10 %
FEV6-%PRED-PRE: 67 %
FEV6-%Pred-Post: 60 %
FEV6-POST: 1.75 L
FEV6-PRE: 1.96 L
FEV6FVC-%Change-Post: 0 %
FEV6FVC-%PRED-POST: 105 %
FEV6FVC-%PRED-PRE: 104 %
FVC-%CHANGE-POST: -11 %
FVC-%PRED-PRE: 64 %
FVC-%Pred-Post: 57 %
FVC-POST: 1.75 L
FVC-Pre: 1.98 L
POST FEV6/FVC RATIO: 100 %
PRE FEV1/FVC RATIO: 80 %
Post FEV1/FVC ratio: 84 %
Pre FEV6/FVC Ratio: 99 %
RV % PRED: 186 %
RV: 4.48 L
TLC % PRED: 126 %
TLC: 6.77 L

## 2016-11-07 MED ORDER — FLUTICASONE-SALMETEROL 55-14 MCG/ACT IN AEPB: 1.0000 | INHALATION_SPRAY | Freq: Two times a day (BID) | RESPIRATORY_TRACT | 5 refills | Status: DC

## 2016-11-07 MED ORDER — FLUTICASONE-SALMETEROL 55-14 MCG/ACT IN AEPB: 1.0000 | INHALATION_SPRAY | Freq: Two times a day (BID) | RESPIRATORY_TRACT | 0 refills | Status: DC

## 2016-11-07 MED ORDER — ALBUTEROL SULFATE (2.5 MG/3ML) 0.083% IN NEBU
2.5000 mg | INHALATION_SOLUTION | Freq: Once | RESPIRATORY_TRACT | Status: AC
  Administered 2016-11-07: 2.5 mg via RESPIRATORY_TRACT

## 2016-11-07 NOTE — Progress Notes (Signed)
@Patient  ID: [name], female    DOB: 1941-02-25, 76 y.o.   MRN: [id number]  No chief complaint on file.   Referring provider: Laurey Morale, MD  HPI: 76 yo female never smoker followed for Asthma  She has a very large Hiatal hernia .  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     TEST  Spirometry 09/2013 >> FEV1 43%, FVC 44% CT chest 08/2013 >> massive hiatal hernia with compressive lower lung atelectasis stable from 2013 CT abdomen 01/2015 >> unchanged hiatal hernia 05/2015 ONO on RA - 20 min desatn   11/07/16 Follow up : Asthma  Pt returns for 1 month follow up . She was seen last ov with Dr. Halford Chessman  For worsening dyspnea . Felt increased dyspnea in afternoon hours after eating dinner especially. She has associated belching , bloating, dry cough cough and intermittent wheezing .  She was started on BREO and feels this does help but is too expensive.  She had a chest xray that showed increased large hiatal hernia w/ encroachment of the entire lower right and left hemi thoraces w/ associated atelectasis .   She has a known  large Hiatal hernia and previously seen by GI and is treated w/ PPI .  It was felt this may be causing much of her symptoms and she was referred to [facility] to surgeon and has an appointment last this month with surgeon.   PFT done today and reviewed in detail with pt. It shows Restriction w/ no BD response or airflow obstruction.  FEV1 69%, ratio 80, FVC 64%, no sign BD response , DLCO 46%.   Allergies  Allergen Reactions  . Codeine Hives  . Darvon [Propoxyphene]   . Demerol [Meperidine]   . Propoxyphene Hcl Hives  . Meperidine Hcl Rash    Immunization History  Administered Date(s) Administered  . Influenza Split 07/23/2012  . Influenza Whole 09/18/2007, 08/19/2011  . Influenza, High Dose Seasonal PF 08/14/2013, 07/30/2014, 08/02/2015, 08/08/2016  . Pneumococcal Conjugate-13 09/28/2013  . Pneumococcal Polysaccharide-23  09/25/2007  . Tetanus 07/30/2014  . Zoster 11/14/2010    Past Medical History:  Diagnosis Date  . Adenomatous colon polyp   . Anal fissure   . Asthma    sees Dr. Lyda Jester   . Bowel obstruction   . Bursitis   . Diverticulosis   . Glaucoma   . Hyperlipidemia   . Hypertension   . Hypothyroidism   . Myasthenia gravis ([place]) 2013   followed at [facility] Dr Scheryl Marten, diplopia only  . Osteoarthritis    see's Dr. [name]  . Osteoporosis    last DEXA 08-03-11  . Renal insufficiency    sees Dr. [name]     Tobacco History: History  Smoking Status  . Never Smoker  Smokeless Tobacco  . Never Used   Counseling given: Not Answered   Outpatient Encounter Prescriptions as of 11/07/2016  Medication Sig  . aspirin 81 MG tablet Take 81 mg by mouth daily.  Marland Kitchen atorvastatin (LIPITOR) 10 MG tablet Take 1 tablet (10 mg total) by mouth daily.  Marland Kitchen levothyroxine (SYNTHROID, LEVOTHROID) 100 MCG tablet Take 1 tablet (100 mcg total) by mouth daily.  . metoprolol succinate (TOPROL-XL) 100 MG 24 hr tablet Take 1 tablet (100 mg total) by mouth daily. Take with or immediately following a meal.  . multivitamin-iron-minerals-folic acid (CENTRUM) chewable tablet Chew 1 tablet by mouth daily.  Marland Kitchen omeprazole (PRILOSEC) 40 MG capsule  Take 1 capsule (40 mg total) by mouth daily.  . predniSONE (DELTASONE) 1 MG tablet Take 4 mg by mouth daily.  . psyllium (METAMUCIL) 58.6 % powder Take 1 packet by mouth daily.  Marland Kitchen triamterene-hydrochlorothiazide (DYAZIDE) 37.5-25 MG capsule Take 1 each (1 capsule total) by mouth daily.  . [DISCONTINUED] fluticasone furoate-vilanterol (BREO ELLIPTA) 100-25 MCG/INH AEPB Inhale 1 puff into the lungs daily.  . bimatoprost (LUMIGAN) 0.01 % SOLN Place 1 drop into both eyes at bedtime. (Patient not taking: Reported on 11/07/2016)  . Fluticasone-Salmeterol (AIRDUO RESPICLICK 95/09) 32-67 MCG/ACT AEPB Inhale 1 puff into the lungs 2 (two) times daily.  . Fluticasone-Salmeterol (AIRDUO  RESPICLICK 12/45) 80-99 MCG/ACT AEPB Inhale 1 puff into the lungs 2 (two) times daily.  . potassium chloride (KLOR-CON 10) 10 MEQ tablet Take 1 tablet (10 mEq total) by mouth daily. (Patient not taking: Reported on 11/07/2016)   No facility-administered encounter medications on file as of 11/07/2016.      Review of Systems  Constitutional:   No  weight loss, night sweats,  Fevers, chills, + fatigue, or  lassitude.  HEENT:   No headaches,  Difficulty swallowing,  Tooth/dental problems, or  Sore throat,                No sneezing, itching, ear ache, nasal congestion, post nasal drip,   CV:  No chest pain,  Orthopnea, PND, swelling in lower extremities, anasarca, dizziness, palpitations, syncope.   GI +heartburn, indigestion,  No abdominal pain, nausea, vomiting, diarrhea, change in bowel habits, loss of appetite, bloody stools.   Resp:   No non-productive cough,  No coughing up of blood.  No change in color of mucus.  No wheezing.  No chest wall deformity  Skin: no rash or lesions.  GU: no dysuria, change in color of urine, no urgency or frequency.  No flank pain, no hematuria   MS:  No joint pain or swelling.  No decreased range of motion.  No back pain.    Physical Exam  BP 122/90 (BP Location: Right Arm, Patient Position: Sitting, Cuff Size: Normal)   Pulse 90   Temp 98.1 F (36.7 C)   Ht 5' 6"  (1.676 m)   Wt 175 lb 9.6 oz (79.7 kg)   SpO2 94%   BMI 28.34 kg/m   GEN: A/Ox3; pleasant , NAD, elderly    HEENT:  [place]/AT,  EACs-clear, TMs-wnl, NOSE-clear, THROAT-clear, no lesions, no postnasal drip or exudate noted.   NECK:  Supple w/ fair ROM; no JVD; normal carotid impulses w/o bruits; no thyromegaly or nodules palpated; no lymphadenopathy.    RESP  Clear  P & A; w/o, wheezes/ rales/ or rhonchi. no accessory muscle use, no dullness to percussion  CARD:  RRR, no m/r/g, no peripheral edema, pulses intact, no cyanosis or clubbing.  GI:   Soft & nt; nml bowel sounds; no  organomegaly or masses detected.   Musco: Warm bil, no deformities or joint swelling noted.   Neuro: alert, no focal deficits noted.    Skin: Warm, no lesions or rashes  Psych:  No change in mood or affect. No depression or anxiety.  No memory loss.  Lab Results:  CBC    Component Value Date/Time   WBC 7.8 08/01/2016 0842   RBC 4.76 08/01/2016 0842   HGB 14.4 08/01/2016 0842   HCT 42.7 08/01/2016 0842   PLT 167.0 08/01/2016 0842   MCV 89.8 08/01/2016 0842   MCH 29.5 10/19/2010 0440   MCHC  33.8 08/01/2016 0842   RDW 13.9 08/01/2016 0842   LYMPHSABS 2.8 08/01/2016 0842   MONOABS 0.6 08/01/2016 0842   EOSABS 0.1 08/01/2016 0842   BASOSABS 0.0 08/01/2016 0842    BMET    Component Value Date/Time   NA 141 10/31/2016 0920   K 3.3 (L) 10/31/2016 0920   CL 103 10/31/2016 0920   CO2 30 10/31/2016 0920   GLUCOSE 93 10/31/2016 0920   BUN 24 (H) 10/31/2016 0920   CREATININE 1.17 10/31/2016 0920   CALCIUM 8.9 10/31/2016 0920   GFRNONAA 42 (L) 10/10/2010 1205   GFRAA (L) 10/10/2010 1205    51        The eGFR has been calculated using the MDRD equation. This calculation has not been validated in all clinical situations. eGFR's persistently <60 mL/min signify possible Chronic Kidney Disease.    BNP No results found for: BNP  ProBNP No results found for: PROBNP  Imaging: Dg Chest 2 View  Result Date: 10/10/2016 CLINICAL DATA:  Sob on exertion ( mainly while eating) x 4 mos, HTN, non smoker, asthma, hx of MG (Myathenia Gravis), hiatal hernia. EXAM: CHEST  2 VIEW COMPARISON:  Chest CT, 08/07/2013.  Chest radiograph, 08/08/2011. FINDINGS: There is a large hiatal hernia with associated lung base atelectasis. Hernia has increased in size from prior studies now occupying the entire inferior hemithoraces. Remainder of the lungs is clear. No convincing mediastinal or hilar masses, although portions of the hilum and the inferior mediastinum are obscured by the hernia. No  convincing pleural effusion.  No pneumothorax. Skeletal structures are demineralized but grossly intact. IMPRESSION: Large hernia which has increased in size and now encroaches on the entire lower right and left hemi thoraces. There is associated compressive atelectasis. No evidence of pneumonia and no pulmonary edema. Electronically Signed   By: Lajean Manes M.D.   On: 10/10/2016 16:00     Assessment & Plan:   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 level reflux -She seems to be improved with BREO. -will change to alternative to see if more affordable as insuarnace does not cover BREO   Suspect dyspnea may be secondary to large [place] that is resulting in restrictive lung process and compressive atx .  Would go for surgical consult   Cont w/ GRED tx  Plan  Patient Instructions  May change from [facility] to Air Duo 55  1 puff Twice daily  , rinse after use.  Follow up with [facility] as planned for surgical consult .  follow up Dr. Halford Chessman  In 3 months and As needed       Restrictive lung disease PFT reviewed with pt . W/ restrictive lung dz from large hiatal hernia , seems to have increased in size on CXR .  Go for Surgical consult as planned this month at [facility]   GERD Cont GERD tx      Rexene Edison, NP 11/08/2016

## 2016-11-07 NOTE — Patient Instructions (Signed)
May change from [facility] to Air Duo 55  1 puff Twice daily  , rinse after use.  Follow up with [facility] as planned for surgical consult .  follow up Dr. Halford Chessman  In 3 months and As needed

## 2016-11-07 NOTE — Telephone Encounter (Signed)
Left voicemail letting patient know Airduo medication [facility]. Patient was informed to call if she had any questions.

## 2016-11-08 NOTE — Progress Notes (Signed)
I have reviewed and agree with assessment/plan.  Chesley Mires, MD [facility] Pulmonary/Critical Care 11/08/2016, 12:18 PM Pager:  [phone]

## 2016-11-08 NOTE — Assessment & Plan Note (Addendum)
-  She seems to be improved with BREO. -will change to alternative to see if more affordable as insuarnace does not cover BREO   Suspect dyspnea may be secondary to large [place] that is resulting in restrictive lung process and compressive atx .  Would go for surgical consult   Cont w/ GRED tx  Plan  Patient Instructions  May change from [facility] to Air Duo 55  1 puff Twice daily  , rinse after use.  Follow up with [facility] as planned for surgical consult .  follow up Dr. Halford Chessman  In 3 months and As needed

## 2016-11-08 NOTE — Assessment & Plan Note (Signed)
Cont GERD tx

## 2016-11-08 NOTE — Assessment & Plan Note (Signed)
PFT reviewed with pt . W/ restrictive lung dz from large hiatal hernia , seems to have increased in size on CXR .  Go for Surgical consult as planned this month at [facility]

## 2016-11-13 ENCOUNTER — Telehealth: Payer: Self-pay | Admitting: Adult Health

## 2016-11-13 NOTE — Telephone Encounter (Signed)
Called and spoke with pt and she stated that her mail order pharmacy has denied the airduo.  I advised her that this was sent in for only 1 inhaler and that may be why they denied it.  I advised the pt to use the inhaler sample that was given and call us once she is sure that she will be able to use it, then we can send this in for her. Pt agreed.

## 2016-12-03 DIAGNOSIS — J9811 Atelectasis: Secondary | ICD-10-CM | POA: Diagnosis not present

## 2016-12-03 DIAGNOSIS — K449 Diaphragmatic hernia without obstruction or gangrene: Secondary | ICD-10-CM | POA: Diagnosis not present

## 2016-12-03 DIAGNOSIS — J984 Other disorders of lung: Secondary | ICD-10-CM | POA: Diagnosis not present

## 2016-12-03 DIAGNOSIS — J4540 Moderate persistent asthma, uncomplicated: Secondary | ICD-10-CM | POA: Diagnosis not present

## 2016-12-04 ENCOUNTER — Other Ambulatory Visit (INDEPENDENT_AMBULATORY_CARE_PROVIDER_SITE_OTHER): Payer: Medicare HMO

## 2016-12-04 DIAGNOSIS — N289 Disorder of kidney and ureter, unspecified: Secondary | ICD-10-CM | POA: Diagnosis not present

## 2016-12-04 LAB — BASIC METABOLIC PANEL
BUN: 20 mg/dL (ref 6–23)
CO2: 30 meq/L (ref 19–32)
Calcium: 9.6 mg/dL (ref 8.4–10.5)
Chloride: 104 mEq/L (ref 96–112)
Creatinine, Ser: 1.26 mg/dL — ABNORMAL HIGH (ref 0.40–1.20)
GFR: 43.92 mL/min — AB (ref >60.00)
GLUCOSE: 94 mg/dL (ref 70–99)
POTASSIUM: 3.5 meq/L (ref 3.5–5.1)
SODIUM: 141 meq/L (ref 135–145)

## 2016-12-25 DIAGNOSIS — H401132 Primary open-angle glaucoma, bilateral, moderate stage: Secondary | ICD-10-CM | POA: Diagnosis not present

## 2017-02-13 ENCOUNTER — Ambulatory Visit (INDEPENDENT_AMBULATORY_CARE_PROVIDER_SITE_OTHER): Payer: Medicare HMO | Admitting: Pulmonary Disease

## 2017-02-13 ENCOUNTER — Encounter: Payer: Self-pay | Admitting: Pulmonary Disease

## 2017-02-13 VITALS — BP 134/72 | HR 80 | Ht 66.0 in | Wt 173.6 lb

## 2017-02-13 DIAGNOSIS — J4530 Mild persistent asthma, uncomplicated: Secondary | ICD-10-CM | POA: Diagnosis not present

## 2017-02-13 DIAGNOSIS — K449 Diaphragmatic hernia without obstruction or gangrene: Secondary | ICD-10-CM

## 2017-02-13 DIAGNOSIS — J984 Other disorders of lung: Secondary | ICD-10-CM | POA: Diagnosis not present

## 2017-02-13 NOTE — Progress Notes (Signed)
Current Outpatient Prescriptions on File Prior to Visit  Medication Sig  . aspirin 81 MG tablet Take 81 mg by mouth daily.  Marland Kitchen atorvastatin (LIPITOR) 10 MG tablet Take 1 tablet (10 mg total) by mouth daily.  . Fluticasone-Salmeterol (AIRDUO RESPICLICK 16/10) 96-04 MCG/ACT AEPB Inhale 1 puff into the lungs 2 (two) times daily. (Patient taking differently: Inhale 1 puff into the lungs as needed. )  . levothyroxine (SYNTHROID, LEVOTHROID) 100 MCG tablet Take 1 tablet (100 mcg total) by mouth daily.  . metoprolol succinate (TOPROL-XL) 100 MG 24 hr tablet Take 1 tablet (100 mg total) by mouth daily. Take with or immediately following a meal.  . multivitamin-iron-minerals-folic acid (CENTRUM) chewable tablet Chew 1 tablet by mouth daily.  Marland Kitchen omeprazole (PRILOSEC) 40 MG capsule Take 1 capsule (40 mg total) by mouth daily.  . predniSONE (DELTASONE) 1 MG tablet Take 4 mg by mouth daily.  . psyllium (METAMUCIL) 58.6 % powder Take 1 packet by mouth daily.  Marland Kitchen triamterene-hydrochlorothiazide (DYAZIDE) 37.5-25 MG capsule Take 1 each (1 capsule total) by mouth daily.  . potassium chloride (KLOR-CON 10) 10 MEQ tablet Take 1 tablet (10 mEq total) by mouth daily. (Patient not taking: Reported on 02/13/2017)   No current facility-administered medications on file prior to visit.      Chief Complaint  Patient presents with  . Follow-up    Pt notes no change in breathing since last OV. Pt seen by Dr [name] at [facility], MD adviased against surgery to repair Hiatal Hernia   Pulmonary tests PFT 11/07/16 >> FEV1 1.58 (69%), FEV1% 80, TLC 6.77 (126%), DLCO 46%  Past medical history Glaucoma, HLD, HTN, Hypothyroidism, OA, CKD  Past surgical history, Family history, Social history, Allergies all reviewed.  Vital Signs BP 134/72 (BP Location: Left Arm, Cuff Size: Normal)   Pulse 80   Ht 5\' 6"  (1.676 m)   Wt 173 lb 9.6 oz (78.7 kg)   SpO2 92%   BMI 28.02 kg/m   History of Present Illness Christina Jacobs is  a 76 y.o. female with asthma, hiatal hernia and myasthenia gravis.  She was seen by Dr. Saint Lucia at [facility] to assess for hiatal hernia surgery.  She decided not to proceed with surgery.  She is able to keep up with usual activities w/o too much difficulty with her breathing.  She does get cough and chest congestion occasionally.  She uses aeroduo prn and this helps.  She is noticed more breathing trouble after meals, and has to clear her throat more frequently after eating.  She is not having chest pain, fever, hemoptysis, or swelling.  Physical Exam  General - pleasant ENT - no sinus tenderness Cardiac - regular, no murmur Chest - no wheeze Back - no tenderness Abd - soft, non tender Ext - no edema Neuro - normal strength Skin - no rashes Psych - normal mood  Assessment/Plan  Mild, intermittent asthma. 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 - monitor clinically - she decided against surgical intervention at this time  Myasthenia gravis. - continue current dose of prednisone per neurology   Patient Instructions  Check with your insurance company about which albuterol inhaler brand is covered to treat asthma  Follow up in 1 year    Chesley Mires, MD [place] Pulmonary/Critical Care/Sleep Pager:  [phone] 02/13/2017, 6:01  PM

## 2017-02-13 NOTE — Patient Instructions (Signed)
Check with your insurance company about which albuterol inhaler brand is covered to treat asthma  Follow up in 1 year

## 2017-02-15 ENCOUNTER — Telehealth: Payer: Self-pay | Admitting: Pulmonary Disease

## 2017-02-15 MED ORDER — ALBUTEROL SULFATE HFA 108 (90 BASE) MCG/ACT IN AERS: 2.0000 | INHALATION_SPRAY | Freq: Four times a day (QID) | RESPIRATORY_TRACT | 3 refills | Status: DC | PRN

## 2017-02-15 NOTE — Telephone Encounter (Signed)
rx has been sent in per pts request.  Nothing further is needed.

## 2017-02-15 NOTE — Telephone Encounter (Signed)
Patient confirmed 90-day supply and [facility] [address].Christina Jacobs

## 2017-02-15 NOTE — Telephone Encounter (Signed)
Called and LMOMTCB x 1.  Wanted to make sure the pt wanted [facility] [address]

## 2017-03-08 ENCOUNTER — Ambulatory Visit: Payer: Medicare HMO | Admitting: Physician Assistant

## 2017-03-28 DIAGNOSIS — D1801 Hemangioma of skin and subcutaneous tissue: Secondary | ICD-10-CM | POA: Diagnosis not present

## 2017-03-28 DIAGNOSIS — L570 Actinic keratosis: Secondary | ICD-10-CM | POA: Diagnosis not present

## 2017-03-28 DIAGNOSIS — D485 Neoplasm of uncertain behavior of skin: Secondary | ICD-10-CM | POA: Diagnosis not present

## 2017-03-28 DIAGNOSIS — L812 Freckles: Secondary | ICD-10-CM | POA: Diagnosis not present

## 2017-03-28 DIAGNOSIS — C44519 Basal cell carcinoma of skin of other part of trunk: Secondary | ICD-10-CM | POA: Diagnosis not present

## 2017-03-28 DIAGNOSIS — L821 Other seborrheic keratosis: Secondary | ICD-10-CM | POA: Diagnosis not present

## 2017-04-15 ENCOUNTER — Ambulatory Visit: Payer: Medicare HMO | Admitting: Gastroenterology

## 2017-05-01 ENCOUNTER — Ambulatory Visit: Payer: Medicare HMO | Admitting: Gastroenterology

## 2017-05-01 DIAGNOSIS — H401132 Primary open-angle glaucoma, bilateral, moderate stage: Secondary | ICD-10-CM | POA: Diagnosis not present

## 2017-06-03 ENCOUNTER — Ambulatory Visit (INDEPENDENT_AMBULATORY_CARE_PROVIDER_SITE_OTHER): Payer: Medicare HMO | Admitting: Gastroenterology

## 2017-06-03 ENCOUNTER — Encounter: Payer: Self-pay | Admitting: Gastroenterology

## 2017-06-03 VITALS — BP 146/86 | HR 72 | Ht 64.75 in | Wt 172.2 lb

## 2017-06-03 DIAGNOSIS — K5904 Chronic idiopathic constipation: Secondary | ICD-10-CM

## 2017-06-03 DIAGNOSIS — R197 Diarrhea, unspecified: Secondary | ICD-10-CM

## 2017-06-03 DIAGNOSIS — K219 Gastro-esophageal reflux disease without esophagitis: Secondary | ICD-10-CM

## 2017-06-03 DIAGNOSIS — Z86010 Personal history of colonic polyps: Secondary | ICD-10-CM | POA: Diagnosis not present

## 2017-06-03 NOTE — Progress Notes (Signed)
History of Present Illness: This is a 76 year old female chronic constipation with infrequent diarrhea and fecal incontinence. She is accompanied by her husband. She has a history of a large diaphragmatic hiatal hernia that contains all of the stomach as well as portions of large and small bowel and pancreas. Surgical repair was not possible. Last colonoscopy performed by Dr. Sharlett Iles in November 2011 was difficult to to her diaphragmatic hernia. The patient declined to proceed with colonoscopy in 2016 due to this prior difficult colonoscopy and her large diaphragmatic hernia. She relates symptoms have been present for several years but they have been worse recently she is most concerned about her urgent intermittent episodes of diarrhea. She spoke with Dr. Sarajane Jews about these problems in 2016 and CT scan below was ordered. She takes prunes and MiraLAX intermittently but no regimen regularly and she often goes several days between bowel movements.  Abd/pelvic CT 01/2015 IMPRESSION: 1. Large hiatal hernia, unchanged in appearance. No associated bowel obstruction or mesenteric edema. 2. 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 3. Status post hysterectomy, appendectomy. 4. Significant stool burden.  Redundant colon. 5. Diverticulosis.   Allergies  Allergen Reactions  . Codeine Hives  . Darvon [Propoxyphene]   . Demerol [Meperidine]   . Propoxyphene Hcl Hives  . Meperidine Hcl Rash   Outpatient Medications Prior to Visit  Medication Sig Dispense Refill  . aspirin 81 MG tablet Take 81 mg by mouth daily.    Marland Kitchen atorvastatin (LIPITOR) 10 MG tablet Take 1 tablet (10 mg total) by mouth daily. 90 tablet 3  . Fluticasone-Salmeterol (AIRDUO RESPICLICK 82/42) 35-36 MCG/ACT AEPB Inhale 1 puff into the lungs 2 (two) times daily. (Patient taking differently: Inhale 1 puff into the lungs as needed. ) 1 each 0  . levothyroxine (SYNTHROID, LEVOTHROID) 100 MCG tablet Take 1 tablet (100 mcg total) by mouth daily. 90 tablet 3   . metoprolol succinate (TOPROL-XL) 100 MG 24 hr tablet Take 1 tablet (100 mg total) by mouth daily. Take with or immediately following a meal. 90 tablet 3  . multivitamin-iron-minerals-folic acid (CENTRUM) chewable tablet Chew 1 tablet by mouth daily.    . predniSONE (DELTASONE) 1 MG tablet Take 4 mg by mouth daily.    . psyllium (METAMUCIL) 58.6 % powder Take 1 packet by mouth daily.    . Travoprost, BAK Free, (TRAVATAN) 0.004 % SOLN ophthalmic solution Place 1 drop into both eyes at bedtime.    . triamterene-hydrochlorothiazide (DYAZIDE) 37.5-25 MG capsule Take 1 each (1 capsule total) by mouth daily. 90 capsule 3  . albuterol (PROVENTIL HFA;VENTOLIN HFA) 108 (90 Base) MCG/ACT inhaler Inhale 2 puffs into the lungs every 6 (six) hours as needed for wheezing or shortness of breath. 3 Inhaler 3  . omeprazole (PRILOSEC) 40 MG capsule Take 1 capsule (40 mg total) by mouth daily. 90 capsule 1  . potassium chloride (KLOR-CON 10) 10 MEQ tablet Take 1 tablet (10 mEq total) by mouth daily. (Patient not taking: Reported on 02/13/2017) 90 tablet 3   No facility-administered medications prior to visit.    Past Medical History:  Diagnosis Date  . Adenomatous colon polyp   . Anal fissure   . Asthma    sees Dr. Lyda Jester   . Bowel obstruction ([place])   . Bursitis   . Diverticulosis   . Glaucoma   . Hyperlipidemia   . Hypertension   . Hypothyroidism   . Myasthenia gravis ([place]) 2013   followed at [facility] Dr Scheryl Marten, diplopia only  .  Osteoarthritis    see's Dr. [name]  . Osteoporosis    last DEXA 08-03-11  . Renal insufficiency    sees Dr. [name]    Past Surgical History:  Procedure Laterality Date  . ABDOMINAL HYSTERECTOMY    . APPENDECTOMY    . BREAST LUMPECTOMY Bilateral     several, enign breast lumps removed  . cardiac stress test  02-24-07   normal  . colonscopy   09/27/2010   per Dr. Sharlett Iles,  benign polyps, no repeats needed   . HERNIA REPAIR  10-18-10   Dr. Hassell Done  attempted but could reduce this, the entire stomach is above the diaphragm  . THYROIDECTOMY, PARTIAL  1999  . TONSILLECTOMY     Social History   Social History  . Marital status: Married    Spouse name: N/A  . Number of children: 3  . Years of education: N/A   Occupational History  . retired Retired   Social History Main Topics  . Smoking status: Never Smoker  . Smokeless tobacco: Never Used  . Alcohol use No  . Drug use: No  . Sexual activity: Not Asked   Other Topics Concern  . None   Social History Narrative   Retired   Married         Family History  Problem Relation Age of Onset  . Breast cancer Maternal Grandmother   . Diabetes Father   . Hyperlipidemia Father   . Hypertension Father   . Cancer Father        origin unknown  . Kidney cancer Mother   . Clotting disorder Brother   . Cancer Sister        2015, anal ca     ROS: as in HPI, otherwise negative.  Physical Exam: General: Well developed, well nourished, no acute distress Head: Normocephalic and atraumatic Eyes:  sclerae anicteric, EOMI Ears: Normal auditory acuity Mouth: No deformity or lesions Lungs: Clear throughout to auscultation Heart: Regular rate and rhythm; no murmurs, rubs or bruits Abdomen: Soft, non tender and non distended. No masses, hepatosplenomegaly or hernias noted. Normal Bowel sounds Musculoskeletal: Symmetrical with no gross deformities  Pulses:  Normal pulses noted Extremities: No clubbing, cyanosis, edema or deformities noted Neurological: Alert oriented x 4, grossly nonfocal Psychological:  Alert and cooperative. Normal mood and affect  Assessment and Recommendations:  1. Chronic constipation with suspected overflow diarrhea and fecal incontinence. MiraLAX once or twice daily every day titrated for complete bowel movements each day. Kegel exercises. REV in 6 weeks.   2. GERD. Continue antireflux measures and omeprazole 40 mg daily.  3. Personal history of  adenomatous colon polyps and family history of colon cancer in her sister. Last colonoscopy in November 2011 did not show polyps. She does not want to have a colonoscopy due to her previous difficult colonoscopy and large diaphragmatic hernia.

## 2017-06-03 NOTE — Patient Instructions (Signed)
Start over the counter Miralax mixing 17 grams in 8 oz or water 1-2 x daily to titrate depending on bowel movements.   Normal BMI (Body Mass Index- based on height and weight) is between 23 and 30. Your BMI today is Body mass index is 28.89 kg/m. Marland Kitchen Please consider follow up  regarding your BMI with your Primary Care Provider.  Thank you for choosing me and [facility].  Pricilla Riffle. Dagoberto Ligas., MD., Marval Regal

## 2017-07-24 DIAGNOSIS — Z6829 Body mass index (BMI) 29.0-29.9, adult: Secondary | ICD-10-CM | POA: Diagnosis not present

## 2017-07-24 DIAGNOSIS — N183 Chronic kidney disease, stage 3 (moderate): Secondary | ICD-10-CM | POA: Diagnosis not present

## 2017-07-24 DIAGNOSIS — I10 Essential (primary) hypertension: Secondary | ICD-10-CM | POA: Diagnosis not present

## 2017-07-24 DIAGNOSIS — E039 Hypothyroidism, unspecified: Secondary | ICD-10-CM | POA: Diagnosis not present

## 2017-07-24 DIAGNOSIS — N390 Urinary tract infection, site not specified: Secondary | ICD-10-CM | POA: Diagnosis not present

## 2017-07-25 ENCOUNTER — Encounter: Payer: Self-pay | Admitting: Family Medicine

## 2017-07-26 ENCOUNTER — Encounter: Payer: Self-pay | Admitting: Gastroenterology

## 2017-07-26 ENCOUNTER — Ambulatory Visit (INDEPENDENT_AMBULATORY_CARE_PROVIDER_SITE_OTHER): Payer: Medicare HMO | Admitting: Gastroenterology

## 2017-07-26 VITALS — BP 106/76 | HR 72 | Ht 66.0 in | Wt 170.0 lb

## 2017-07-26 DIAGNOSIS — R197 Diarrhea, unspecified: Secondary | ICD-10-CM | POA: Diagnosis not present

## 2017-07-26 DIAGNOSIS — K921 Melena: Secondary | ICD-10-CM | POA: Diagnosis not present

## 2017-07-26 DIAGNOSIS — K5904 Chronic idiopathic constipation: Secondary | ICD-10-CM | POA: Diagnosis not present

## 2017-07-26 NOTE — Patient Instructions (Signed)
Please purchase the following medications over the counter and take as directed: Preparation H Cream to rectum twice daily x 5 days, then you may use as needed after that.  Resume metamucil, miralax or both for constipation (over the counter medications)  Follow up with Dr Fuller Plan as needed.  If you are age 76 or older, your body mass index should be between 23-30. Your Body mass index is 27.44 kg/m. If this is out of the aforementioned range listed, please consider follow up with your Primary Care Provider.  If you are age 14 or younger, your body mass index should be between 19-25. Your Body mass index is 27.44 kg/m. If this is out of the aformentioned range listed, please consider follow up with your Primary Care Provider.

## 2017-07-26 NOTE — Progress Notes (Signed)
    History of Present Illness: This is a 76 year old female returning for follow up of constipation with occasional diarrhea and occasional incontinence. She has very few problems over the past few weeks and she stopped metamucil and Miralax. She noted a small streak of blood on her stool today. No prior bleeding noted.   Current Medications, Allergies, Past Medical History, Past Surgical History, Family History and Social History were reviewed in [employer] record.  Physical Exam: General: Well developed, well nourished, no acute distress Head: Normocephalic and atraumatic Eyes:  sclerae anicteric, EOMI Ears: Normal auditory acuity Mouth: No deformity or lesions Lungs: Clear throughout to auscultation Heart: Regular rate and rhythm; no murmurs, rubs or bruits Abdomen: Soft, non tender and non distended. No masses, hepatosplenomegaly or hernias noted. Normal Bowel sounds Rectal: small external hemorrhoid, no other lesions, no tenderness, trace heme pos brown stool Musculoskeletal: Symmetrical with no gross deformities  Pulses:  Normal pulses noted Extremities: No clubbing, cyanosis, edema or deformities noted Neurological: Alert oriented x 4, grossly nonfocal Psychological:  Alert and cooperative. Normal mood and affect  Assessment and Recommendations:  1. Chronic constipation with intermittent diarrhea and occasional incontinence likely related to overflow diarrhea. Diarrhea and incontinence have not occurred recently. Continue Kegel exercises. She is advised to resume Metamucil and MiraLAX for ongoing management of chronic constipation. Ongoing follow up with her PCP. GI follow prn.  2. Small volume hematochezia today. Likely due to an external hemorrhoid. Preparation H cream twice a day for 5 days and then twice a day when necessary. If she has persistent bleeding she is advised to call for further evaluation. She has previously declined colonoscopy.

## 2017-08-09 ENCOUNTER — Ambulatory Visit (INDEPENDENT_AMBULATORY_CARE_PROVIDER_SITE_OTHER): Payer: Medicare HMO | Admitting: Family Medicine

## 2017-08-09 ENCOUNTER — Encounter: Payer: Self-pay | Admitting: Family Medicine

## 2017-08-09 VITALS — BP 140/90 | HR 62 | Temp 97.8°F | Ht 66.0 in | Wt 169.0 lb

## 2017-08-09 DIAGNOSIS — E039 Hypothyroidism, unspecified: Secondary | ICD-10-CM

## 2017-08-09 DIAGNOSIS — N183 Chronic kidney disease, stage 3 unspecified: Secondary | ICD-10-CM | POA: Insufficient documentation

## 2017-08-09 DIAGNOSIS — K449 Diaphragmatic hernia without obstruction or gangrene: Secondary | ICD-10-CM | POA: Insufficient documentation

## 2017-08-09 DIAGNOSIS — E785 Hyperlipidemia, unspecified: Secondary | ICD-10-CM | POA: Diagnosis not present

## 2017-08-09 DIAGNOSIS — K219 Gastro-esophageal reflux disease without esophagitis: Secondary | ICD-10-CM | POA: Diagnosis not present

## 2017-08-09 DIAGNOSIS — J984 Other disorders of lung: Secondary | ICD-10-CM | POA: Diagnosis not present

## 2017-08-09 DIAGNOSIS — I10 Essential (primary) hypertension: Secondary | ICD-10-CM

## 2017-08-09 DIAGNOSIS — Z23 Encounter for immunization: Secondary | ICD-10-CM | POA: Diagnosis not present

## 2017-08-09 LAB — POC URINALSYSI DIPSTICK (AUTOMATED)
Bilirubin, UA: NEGATIVE
CLARITY UA: NEGATIVE
Glucose, UA: NEGATIVE
KETONES UA: NEGATIVE
PH UA: 7.5 (ref 5.0–8.0)
PROTEIN UA: NEGATIVE
Spec Grav, UA: 1.015 (ref 1.010–1.025)
UROBILINOGEN UA: 0.2 U/dL

## 2017-08-09 LAB — BASIC METABOLIC PANEL
BUN: 17 mg/dL (ref 6–23)
CO2: 31 mEq/L (ref 19–32)
CREATININE: 1.19 mg/dL (ref 0.40–1.20)
Calcium: 9.9 mg/dL (ref 8.4–10.5)
Chloride: 101 mEq/L (ref 96–112)
GFR: 46.83 mL/min — AB (ref >60.00)
Glucose, Bld: 85 mg/dL (ref 70–99)
Potassium: 3.5 mEq/L (ref 3.5–5.1)
Sodium: 140 mEq/L (ref 135–145)

## 2017-08-09 LAB — LIPID PANEL
CHOL/HDL RATIO: 2
Cholesterol: 165 mg/dL (ref 0–200)
HDL: 75.4 mg/dL (ref >39.00)
LDL CALC: 71 mg/dL (ref 0–99)
NONHDL: 89.59
TRIGLYCERIDES: 93 mg/dL (ref 0.0–149.0)
VLDL: 18.6 mg/dL (ref 0.0–40.0)

## 2017-08-09 LAB — HEPATIC FUNCTION PANEL
ALT: 28 U/L (ref 0–35)
AST: 21 U/L (ref 0–37)
Albumin: 4.2 g/dL (ref 3.5–5.2)
Alkaline Phosphatase: 49 U/L (ref 39–117)
BILIRUBIN DIRECT: 0.3 mg/dL (ref 0.0–0.3)
BILIRUBIN TOTAL: 1.4 mg/dL — AB (ref 0.2–1.2)
Total Protein: 6.6 g/dL (ref 6.0–8.3)

## 2017-08-09 LAB — TSH: TSH: 0.87 u[IU]/mL (ref 0.35–4.50)

## 2017-08-09 LAB — CBC WITH DIFFERENTIAL/PLATELET
BASOS ABS: 0 10*3/uL (ref 0.0–0.1)
Basophils Relative: 0.5 % (ref 0.0–3.0)
EOS PCT: 0.9 % (ref 0.0–5.0)
Eosinophils Absolute: 0.1 10*3/uL (ref 0.0–0.7)
HCT: 45.5 % (ref 36.0–46.0)
HEMOGLOBIN: 15.2 g/dL — AB (ref 12.0–15.0)
LYMPHS ABS: 2.5 10*3/uL (ref 0.7–4.0)
Lymphocytes Relative: 29.1 % (ref 12.0–46.0)
MCHC: 33.4 g/dL (ref 30.0–36.0)
MCV: 92.3 fl (ref 78.0–100.0)
MONO ABS: 0.7 10*3/uL (ref 0.1–1.0)
MONOS PCT: 7.7 % (ref 3.0–12.0)
NEUTROS PCT: 61.8 % (ref 43.0–77.0)
Neutro Abs: 5.4 10*3/uL (ref 1.4–7.7)
Platelets: 190 10*3/uL (ref 150.0–400.0)
RBC: 4.93 Mil/uL (ref 3.87–5.11)
RDW: 14.3 % (ref 11.5–15.5)
WBC: 8.7 10*3/uL (ref 4.0–10.5)

## 2017-08-09 MED ORDER — TRIAMTERENE-HCTZ 37.5-25 MG PO CAPS: 1.0000 | ORAL_CAPSULE | Freq: Every day | ORAL | 3 refills | Status: DC

## 2017-08-09 MED ORDER — ATORVASTATIN CALCIUM 10 MG PO TABS: 10.0000 mg | ORAL_TABLET | Freq: Every day | ORAL | 3 refills | Status: DC

## 2017-08-09 MED ORDER — METOPROLOL SUCCINATE ER 100 MG PO TB24: 100.0000 mg | ORAL_TABLET | Freq: Every day | ORAL | 3 refills | Status: DC

## 2017-08-09 MED ORDER — LOSARTAN POTASSIUM 50 MG PO TABS: 50.0000 mg | ORAL_TABLET | Freq: Every day | ORAL | 3 refills | Status: DC

## 2017-08-09 MED ORDER — LEVOTHYROXINE SODIUM 100 MCG PO TABS: 100.0000 ug | ORAL_TABLET | Freq: Every day | ORAL | 3 refills | Status: DC

## 2017-08-09 NOTE — Patient Instructions (Signed)
WE NOW OFFER    [facility] FAST TRACK!!!  SAME DAY Appointments for ACUTE CARE  Such as: Sprains, Injuries, cuts, abrasions, rashes, muscle pain, joint pain, back pain Colds, flu, sore throats, headache, allergies, cough, fever  Ear pain, sinus and eye infections Abdominal pain, nausea, vomiting, diarrhea, upset stomach Animal/insect bites  3 Easy Ways to Schedule: Walk-In Scheduling Call in scheduling Mychart Sign-up: [email or web address]         

## 2017-08-09 NOTE — Progress Notes (Signed)
   Subjective:    Patient ID: Christina Jacobs, female    DOB: 1941/04/11, 76 y.o.   MRN: [id number]  HPI Here to follow up several issues. Her BP remains a little high in the range of 062B to 762G systolic. She saw Dr. Moshe Cipro recently and she suggested Mahogani get back on Losartan. She saw Dr. Saint Lucia in January to consider a surgical repair of her hiatal hernia. He said such a surgery would be quite risky for her and she would have a recurrence rate of 50% anyway, so he did not recommend the surgery. She feels well in general.    Review of Systems  Constitutional: Negative.   HENT: Negative.   Eyes: Negative.   Respiratory: Negative.   Cardiovascular: Negative.   Gastrointestinal: Negative.   Genitourinary: Negative for decreased urine volume, difficulty urinating, dyspareunia, dysuria, enuresis, flank pain, frequency, hematuria, pelvic pain and urgency.  Musculoskeletal: Negative.   Skin: Negative.   Neurological: Negative.   Psychiatric/Behavioral: Negative.        Objective:   Physical Exam  Constitutional: She is oriented to person, place, and time. She appears well-developed and well-nourished. No distress.  HENT:  Head: Normocephalic and atraumatic.  Right Ear: External ear normal.  Left Ear: External ear normal.  Nose: Nose normal.  Mouth/Throat: Oropharynx is clear and moist. No oropharyngeal exudate.  Eyes: Pupils are equal, round, and reactive to light. Conjunctivae and EOM are normal. No scleral icterus.  Neck: Normal range of motion. Neck supple. No JVD present. No thyromegaly present.  Cardiovascular: Normal rate, regular rhythm, normal heart sounds and intact distal pulses.  Exam reveals no gallop and no friction rub.   No murmur heard. Pulmonary/Chest: Effort normal and breath sounds normal. No respiratory distress. She has no wheezes. She has no rales. She exhibits no tenderness.  Abdominal: Soft. Bowel sounds are normal. She exhibits no distension and no mass.  There is no tenderness. There is no rebound and no guarding.  Musculoskeletal: Normal range of motion. She exhibits no edema or tenderness.  Lymphadenopathy:    She has no cervical adenopathy.  Neurological: She is alert and oriented to person, place, and time. She has normal reflexes. No cranial nerve deficit. She exhibits normal muscle tone. Coordination normal.  Skin: Skin is warm and dry. No rash noted. No erythema.  Psychiatric: She has a normal mood and affect. Her behavior is normal. Judgment and thought content normal.          Assessment & Plan:  Her HTN is not well controlled so we will add the Losartan 50 mg daily back to her regimen. She will report back to [place] in 3 weeks. He rCKD is stable. Get fasting labs today to check her renal function, lipids, etc. Check her thyroid level.  Alysia Penna, MD

## 2017-08-18 ENCOUNTER — Encounter: Payer: Self-pay | Admitting: Family Medicine

## 2017-08-19 NOTE — Telephone Encounter (Signed)
Tell her to take 25 mg (or 1/2 of a tablet) daily and let us know how this goes

## 2017-08-21 ENCOUNTER — Encounter: Payer: Self-pay | Admitting: Family Medicine

## 2017-08-21 DIAGNOSIS — Z1231 Encounter for screening mammogram for malignant neoplasm of breast: Secondary | ICD-10-CM | POA: Diagnosis not present

## 2017-08-26 ENCOUNTER — Encounter: Payer: Self-pay | Admitting: Family Medicine

## 2017-08-26 DIAGNOSIS — R922 Inconclusive mammogram: Secondary | ICD-10-CM | POA: Diagnosis not present

## 2017-09-04 DIAGNOSIS — Z79899 Other long term (current) drug therapy: Secondary | ICD-10-CM | POA: Diagnosis not present

## 2017-09-04 DIAGNOSIS — G7000 Myasthenia gravis without (acute) exacerbation: Secondary | ICD-10-CM | POA: Diagnosis not present

## 2017-09-09 DIAGNOSIS — H401132 Primary open-angle glaucoma, bilateral, moderate stage: Secondary | ICD-10-CM | POA: Diagnosis not present

## 2017-09-09 DIAGNOSIS — H04123 Dry eye syndrome of bilateral lacrimal glands: Secondary | ICD-10-CM | POA: Diagnosis not present

## 2017-09-09 DIAGNOSIS — H43813 Vitreous degeneration, bilateral: Secondary | ICD-10-CM | POA: Diagnosis not present

## 2017-09-09 DIAGNOSIS — H52203 Unspecified astigmatism, bilateral: Secondary | ICD-10-CM | POA: Diagnosis not present

## 2017-09-10 ENCOUNTER — Encounter: Payer: Self-pay | Admitting: Family Medicine

## 2017-09-11 NOTE — Telephone Encounter (Signed)
Pancreatic cancer is very difficult to detect in the early stages. The best test is probably a CT scan, but these involve radiation exposure and are very expensive, so we cannot routinely do then on everyone. Of note she had an abdominal CT in 2016 that showed her pancreas to appear normal.

## 2017-09-30 DIAGNOSIS — C44319 Basal cell carcinoma of skin of other parts of face: Secondary | ICD-10-CM | POA: Diagnosis not present

## 2017-09-30 DIAGNOSIS — Z85828 Personal history of other malignant neoplasm of skin: Secondary | ICD-10-CM | POA: Diagnosis not present

## 2017-09-30 DIAGNOSIS — D485 Neoplasm of uncertain behavior of skin: Secondary | ICD-10-CM | POA: Diagnosis not present

## 2017-11-07 DIAGNOSIS — Z85828 Personal history of other malignant neoplasm of skin: Secondary | ICD-10-CM | POA: Diagnosis not present

## 2017-11-07 DIAGNOSIS — C44319 Basal cell carcinoma of skin of other parts of face: Secondary | ICD-10-CM | POA: Diagnosis not present

## 2017-12-01 ENCOUNTER — Encounter: Payer: Self-pay | Admitting: Family Medicine

## 2017-12-03 NOTE — Telephone Encounter (Signed)
She stay off Losartan completely and see me to discuss the next step

## 2017-12-04 ENCOUNTER — Encounter: Payer: Self-pay | Admitting: Family Medicine

## 2017-12-06 ENCOUNTER — Telehealth: Payer: Self-pay | Admitting: Cardiology

## 2017-12-06 ENCOUNTER — Encounter: Payer: Self-pay | Admitting: Family Medicine

## 2017-12-06 ENCOUNTER — Ambulatory Visit: Payer: Medicare HMO | Admitting: Family Medicine

## 2017-12-06 VITALS — BP 120/80 | HR 121 | Temp 98.1°F | Wt 172.2 lb

## 2017-12-06 DIAGNOSIS — I499 Cardiac arrhythmia, unspecified: Secondary | ICD-10-CM | POA: Diagnosis not present

## 2017-12-06 DIAGNOSIS — I10 Essential (primary) hypertension: Secondary | ICD-10-CM

## 2017-12-06 DIAGNOSIS — I4891 Unspecified atrial fibrillation: Secondary | ICD-10-CM | POA: Diagnosis not present

## 2017-12-06 MED ORDER — APIXABAN 5 MG PO TABS: 5.0000 mg | ORAL_TABLET | Freq: Two times a day (BID) | ORAL | 0 refills | Status: DC

## 2017-12-06 NOTE — Progress Notes (Signed)
   Subjective:    Patient ID: Christina Jacobs, female    DOB: [birth year], 77 y.o.   MRN: [id number]  HPI Here to discuss her BP and a high pulse. No chest pain or SOB. No palpitations. She telephoned [place] a week ago about low BP readings and we advised her to stop taking Losartan, which she did. She still takes Dyazide and Metoprolol succinate. Her BP has normalized a bit but her rate remains high. She uses no caffeine and the only supplements she takes OTC are a Centrum multivitamin and vitamin C.    Review of Systems  Constitutional: Negative.   HENT: Negative.   Eyes: Negative.   Respiratory: Negative.   Cardiovascular: Negative.   Neurological: Negative.        Objective:   Physical Exam  Constitutional: She is oriented to person, place, and time. She appears well-developed and well-nourished.  Neck: No thyromegaly present.  Cardiovascular: Normal heart sounds and intact distal pulses.  No murmur heard. Rapid rate, irregular rhythm. EKG shows atrial fibrillation with a rate of 120   Pulmonary/Chest: Effort normal and breath sounds normal. No respiratory distress. She has no wheezes. She has no rales.  Lymphadenopathy:    She has no cervical adenopathy.  Neurological: She is alert and oriented to person, place, and time.          Assessment & Plan:  Recent onset of atrial fibrillation with a rapid ventricular response. I discussed her case with Dr. Percival Spanish over the phone, and per his advice we will increase her Metoprolol succinate 100 mg to taking a whole pill in the morning and another 1/2 pill (50 mg) in the evening for rate control. Start on Eliquis 5 mg bid for anticoagulation. She will stop the baby aspirin and avoid all NSAIDs at home. She will see Dr. Percival Spanish in the cardiology clinic at 8 am on 12-09-17.  Christina Penna, MD

## 2017-12-06 NOTE — Telephone Encounter (Signed)
Pt was started on Eliquis and increased beta blocker.  Has appt with me on Monday.

## 2017-12-07 NOTE — Progress Notes (Addendum)
Cardiology Office Note   Date:  12/09/2017   ID:  [name], DOB 1941-07-15, MRN [id number]  PCP:  Laurey Morale, MD  Cardiologist:   No primary care provider on file. Referring:  Laurey Morale, MD  Chief Complaint  Patient presents with  . Atrial Fibrillation      History of Present Illness: Christina Jacobs is a 77 y.o. female who is referred by Laurey Morale, MD for evaluation of atrial fib.  she was noted to be in this on Friday at Dr. Barbie Banner office.  I spoke with him and she was started on Eliquis and her beta blocker was increased.  I had seen her in 2014 for palpitations and she had no sustained arrhythmias on Holter.  She has felt palpitations for some time and it really has been more significant over the past 3 weeks.  She feels her heart flipping and flopping.  Finally on Friday she felt some lightheadedness and her blood pressure was slightly low.  That is when she went to the emergency room.  He has not had any syncope but she has been slightly presyncopal.  She is not particularly active.  She is limited in her breathing because of a hiatal hernia.  With her activities of daily living she denies any chest pressure, neck or arm discomfort.  She has no PND or orthopnea.  She is had no weight gain or edema.  Past Medical History:  Diagnosis Date  . Adenomatous colon polyp   . Anal fissure   . Asthma    Dr. Halford Chessman  . Bowel obstruction ([place])   . Bursitis   . CKD (chronic kidney disease) stage 3, GFR 30-59 ml/min (HCC)    sees Dr. [name]   . Diverticulosis   . Glaucoma   . Hiatal hernia    sees Dr. [name] at [facility]   . Hyperlipidemia   . Hypertension   . Hypothyroidism   . 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 Osteoarthritis    see's Dr. [name]  . Osteoporosis    last DEXA 08-03-11    Past Surgical History:  Procedure Laterality Date  . ABDOMINAL HYSTERECTOMY    . APPENDECTOMY    .  BREAST LUMPECTOMY Bilateral     several, enign breast lumps removed  . HERNIA REPAIR  10-18-10   Dr. Hassell Done attempted but could reduce this, the entire stomach is above the diaphragm  . THYROIDECTOMY, PARTIAL  1999  . TONSILLECTOMY       Current Outpatient Medications  Medication Sig Dispense Refill  . apixaban (ELIQUIS) 5 MG TABS tablet Take 1 tablet (5 mg total) by mouth 2 (two) times daily. 60 tablet 0  . aspirin 81 MG tablet Take 81 mg by mouth daily.    Marland Kitchen atorvastatin (LIPITOR) 10 MG tablet Take 1 tablet (10 mg total) by mouth daily. 90 tablet 3  . budesonide (PULMICORT FLEXHALER) 180 MCG/ACT inhaler Inhale 1 puff into the lungs as needed.    Marland Kitchen levothyroxine (SYNTHROID, LEVOTHROID) 100 MCG tablet Take 1 tablet (100 mcg total) by mouth daily. 90 tablet 3  . metoprolol succinate (TOPROL-XL) 100 MG 24 hr tablet Take 1 tablet (100 mg total) by mouth daily. Take with or immediately following a meal. 90 tablet 3  . multivitamin-iron-minerals-folic acid (CENTRUM) chewable tablet Chew 1 tablet by mouth daily.    . predniSONE (DELTASONE) 1 MG tablet  Take 4 mg by mouth daily.    . psyllium (METAMUCIL) 58.6 % powder Take 1 packet by mouth daily.    . Travoprost, BAK Free, (TRAVATAN) 0.004 % SOLN ophthalmic solution Place 1 drop into both eyes at bedtime.    . triamterene-hydrochlorothiazide (DYAZIDE) 37.5-25 MG capsule Take 1 each (1 capsule total) by mouth daily. 90 capsule 3   No current facility-administered medications for this visit.     Allergies:   Codeine; Darvon [propoxyphene]; Demerol [meperidine]; Propoxyphene hcl; and Meperidine hcl    Social History:  The patient  reports that  has never smoked. she has never used smokeless tobacco. She reports that she does not drink alcohol or use drugs.   Family History:  The patient's family history includes Breast cancer in her maternal grandmother; Cancer in her father and sister; Clotting disorder in her brother; Diabetes in her father;  Hyperlipidemia in her father; Hypertension in her father; Kidney cancer in her mother.    ROS:  Please see the history of present illness.   Otherwise, review of systems are positive for none.   All other systems are reviewed and negative.    PHYSICAL EXAM: VS:  BP 136/84   Pulse 98   Ht 5\' 6"  (1.676 m)   Wt 171 lb (77.6 kg)   BMI 27.60 kg/m  , BMI Body mass index is 27.6 kg/m. GENERAL:  Well appearing HEENT:  Pupils equal round and reactive, fundi not visualized, oral mucosa unremarkable NECK:  No jugular venous distention, waveform within normal limits, carotid upstroke brisk and symmetric, no bruits, no thyromegaly LYMPHATICS:  No cervical, inguinal adenopathy LUNGS:  Clear to auscultation bilaterally BACK:  No CVA tenderness CHEST:  Unremarkable HEART:  PMI not displaced or sustained,S1 and S2 within normal limits, no S3, no clicks, no rubs, irregular murmurs ABD:  Flat, positive bowel sounds normal in frequency in pitch, no bruits, no rebound, no guarding, no midline pulsatile mass, no hepatomegaly, no splenomegaly EXT:  2 plus pulses throughout, no edema, no cyanosis no clubbing SKIN:  No rashes no nodules NEURO:  Cranial nerves II through XII grossly intact, motor grossly intact throughout PSYCH:  Cognitively intact, oriented to person place and time    EKG:  EKG is ordered today. The ekg ordered today demonstrates atrial fibrillation, rate 98, low voltage in the limb and chest, no acute ST-T wave changes.   Recent Labs: 08/09/2017: ALT 28; BUN 17; Creatinine, Ser 1.19; Hemoglobin 15.2; Platelets 190.0; Potassium 3.5; Sodium 140; TSH 0.87    Lipid Panel    Component Value Date/Time   CHOL 165 08/09/2017 0958   TRIG 93.0 08/09/2017 0958   HDL 75.40 08/09/2017 0958   CHOLHDL 2 08/09/2017 0958   VLDL 18.6 08/09/2017 0958   LDLCALC 71 08/09/2017 0958   LDLDIRECT 142.2 07/27/2013 1007     Lab Results  Component Value Date   TSH 0.87 08/09/2017    Wt Readings  from Last 3 Encounters:  12/09/17 171 lb (77.6 kg)  12/06/17 172 lb 3.2 oz (78.1 kg)  08/09/17 169 lb (76.7 kg)      Other studies Reviewed: Additional studies/ records that were reviewed today include: Labs, EKG and office notes. Review of the above records demonstrates:  Please see elsewhere in the note.     ASSESSMENT AND PLAN:    ATRIAL FIB:    Ms. MEGGIN OLA has a CHA2DS2 - VASc score of 3.  She is on the appropriate dose of anticoagulation.  I am going to continue the current dose of beta-blocker.  I am going to check a Holter monitor and echocardiogram.  In 3 weeks I am planning elective cardioversion.  She has had recent thyroid testing. She is off the ASA and we talked about this today.   HTN:  BP is controlled.  Continue the meds as listed.     Current medicines are reviewed at length with the patient today.  The patient does not have concerns regarding medicines.  The following changes have been made:  no change  Labs/ tests ordered today include:   Orders Placed This Encounter  Procedures  . HOLTER MONITOR - 24 HOUR  . EKG 12-Lead  . ECHOCARDIOGRAM COMPLETE     Disposition:   FU with me after the cardioversion.     Signed, Minus Breeding, MD  12/09/2017 9:00 AM    [place]

## 2017-12-07 NOTE — H&P (View-Only) (Signed)
Cardiology Office Note   Date:  12/09/2017   ID:  Christina Jacobs, DOB [birth year], MRN [id number]  PCP:  Laurey Morale, MD  Cardiologist:   No primary care provider on file. Referring:  Laurey Morale, MD  Chief Complaint  Patient presents with  . Atrial Fibrillation      History of Present Illness: Christina Jacobs is a 77 y.o. female who is referred by Laurey Morale, MD for evaluation of atrial fib.  she was noted to be in this on Friday at Dr. Barbie Banner office.  I spoke with him and she was started on Eliquis and her beta blocker was increased.  I had seen her in 2014 for palpitations and she had no sustained arrhythmias on Holter.  She has felt palpitations for some time and it really has been more significant over the past 3 weeks.  She feels her heart flipping and flopping.  Finally on Friday she felt some lightheadedness and her blood pressure was slightly low.  That is when she went to the emergency room.  He has not had any syncope but she has been slightly presyncopal.  She is not particularly active.  She is limited in her breathing because of a hiatal hernia.  With her activities of daily living she denies any chest pressure, neck or arm discomfort.  She has no PND or orthopnea.  She is had no weight gain or edema.  Past Medical History:  Diagnosis Date  . Adenomatous colon polyp   . Anal fissure   . Asthma    Dr. Halford Chessman  . Bowel obstruction ([place])   . Bursitis   . CKD (chronic kidney disease) stage 3, GFR 30-59 ml/min (HCC)    sees Dr. [name]   . Diverticulosis   . Glaucoma   . Hiatal hernia    sees Dr. [name] at [facility]   . Hyperlipidemia   . Hypertension   . Hypothyroidism   . Myasthenia gravis ([place]) 2013   followed at [facility] Dr Scheryl Marten, diplopia only  . Osteoarthritis    see's Dr. [name]  . Osteoporosis    last DEXA 08-03-11    Past Surgical History:  Procedure Laterality Date  . ABDOMINAL HYSTERECTOMY    . APPENDECTOMY    .  BREAST LUMPECTOMY Bilateral     several, enign breast lumps removed  . HERNIA REPAIR  10-18-10   Dr. Hassell Done attempted but could reduce this, the entire stomach is above the diaphragm  . THYROIDECTOMY, PARTIAL  1999  . TONSILLECTOMY       Current Outpatient Medications  Medication Sig Dispense Refill  . apixaban (ELIQUIS) 5 MG TABS tablet Take 1 tablet (5 mg total) by mouth 2 (two) times daily. 60 tablet 0  . aspirin 81 MG tablet Take 81 mg by mouth daily.    Marland Kitchen atorvastatin (LIPITOR) 10 MG tablet Take 1 tablet (10 mg total) by mouth daily. 90 tablet 3  . budesonide (PULMICORT FLEXHALER) 180 MCG/ACT inhaler Inhale 1 puff into the lungs as needed.    Marland Kitchen levothyroxine (SYNTHROID, LEVOTHROID) 100 MCG tablet Take 1 tablet (100 mcg total) by mouth daily. 90 tablet 3  . metoprolol succinate (TOPROL-XL) 100 MG 24 hr tablet Take 1 tablet (100 mg total) by mouth daily. Take with or immediately following a meal. 90 tablet 3  . multivitamin-iron-minerals-folic acid (CENTRUM) chewable tablet Chew 1 tablet by mouth daily.    . predniSONE (DELTASONE) 1 MG tablet  Take 4 mg by mouth daily.    . psyllium (METAMUCIL) 58.6 % powder Take 1 packet by mouth daily.    . Travoprost, BAK Free, (TRAVATAN) 0.004 % SOLN ophthalmic solution Place 1 drop into both eyes at bedtime.    . triamterene-hydrochlorothiazide (DYAZIDE) 37.5-25 MG capsule Take 1 each (1 capsule total) by mouth daily. 90 capsule 3   No current facility-administered medications for this visit.     Allergies:   Codeine; Darvon [propoxyphene]; Demerol [meperidine]; Propoxyphene hcl; and Meperidine hcl    Social History:  The patient  reports that  has never smoked. she has never used smokeless tobacco. She reports that she does not drink alcohol or use drugs.   Family History:  The patient's family history includes Breast cancer in her maternal grandmother; Cancer in her father and sister; Clotting disorder in her brother; Diabetes in her father;  Hyperlipidemia in her father; Hypertension in her father; Kidney cancer in her mother.    ROS:  Please see the history of present illness.   Otherwise, review of systems are positive for none.   All other systems are reviewed and negative.    PHYSICAL EXAM: VS:  BP 136/84   Pulse 98   Ht 5\' 6"  (1.676 m)   Wt 171 lb (77.6 kg)   BMI 27.60 kg/m  , BMI Body mass index is 27.6 kg/m. GENERAL:  Well appearing HEENT:  Pupils equal round and reactive, fundi not visualized, oral mucosa unremarkable NECK:  No jugular venous distention, waveform within normal limits, carotid upstroke brisk and symmetric, no bruits, no thyromegaly LYMPHATICS:  No cervical, inguinal adenopathy LUNGS:  Clear to auscultation bilaterally BACK:  No CVA tenderness CHEST:  Unremarkable HEART:  PMI not displaced or sustained,S1 and S2 within normal limits, no S3, no clicks, no rubs, irregular murmurs ABD:  Flat, positive bowel sounds normal in frequency in pitch, no bruits, no rebound, no guarding, no midline pulsatile mass, no hepatomegaly, no splenomegaly EXT:  2 plus pulses throughout, no edema, no cyanosis no clubbing SKIN:  No rashes no nodules NEURO:  Cranial nerves II through XII grossly intact, motor grossly intact throughout PSYCH:  Cognitively intact, oriented to person place and time    EKG:  EKG is ordered today. The ekg ordered today demonstrates atrial fibrillation, rate 98, low voltage in the limb and chest, no acute ST-T wave changes.   Recent Labs: 08/09/2017: ALT 28; BUN 17; Creatinine, Ser 1.19; Hemoglobin 15.2; Platelets 190.0; Potassium 3.5; Sodium 140; TSH 0.87    Lipid Panel    Component Value Date/Time   CHOL 165 08/09/2017 0958   TRIG 93.0 08/09/2017 0958   HDL 75.40 08/09/2017 0958   CHOLHDL 2 08/09/2017 0958   VLDL 18.6 08/09/2017 0958   LDLCALC 71 08/09/2017 0958   LDLDIRECT 142.2 07/27/2013 1007     Lab Results  Component Value Date   TSH 0.87 08/09/2017    Wt Readings  from Last 3 Encounters:  12/09/17 171 lb (77.6 kg)  12/06/17 172 lb 3.2 oz (78.1 kg)  08/09/17 169 lb (76.7 kg)      Other studies Reviewed: Additional studies/ records that were reviewed today include: Labs, EKG and office notes. Review of the above records demonstrates:  Please see elsewhere in the note.     ASSESSMENT AND PLAN:    ATRIAL FIB:    Christina Jacobs has a CHA2DS2 - VASc score of 3.  She is on the appropriate dose of anticoagulation.  I am going to continue the current dose of beta-blocker.  I am going to check a Holter monitor and echocardiogram.  In 3 weeks I am planning elective cardioversion.  She has had recent thyroid testing. She is off the ASA and we talked about this today.   HTN:  BP is controlled.  Continue the meds as listed.     Current medicines are reviewed at length with the patient today.  The patient does not have concerns regarding medicines.  The following changes have been made:  no change  Labs/ tests ordered today include:   Orders Placed This Encounter  Procedures  . HOLTER MONITOR - 24 HOUR  . EKG 12-Lead  . ECHOCARDIOGRAM COMPLETE     Disposition:   FU with me after the cardioversion.     Signed, Minus Breeding, MD  12/09/2017 9:00 AM    [place]

## 2017-12-09 ENCOUNTER — Ambulatory Visit: Payer: Medicare HMO | Admitting: Cardiology

## 2017-12-09 ENCOUNTER — Encounter: Payer: Self-pay | Admitting: Cardiology

## 2017-12-09 VITALS — BP 136/84 | HR 98 | Ht 66.0 in | Wt 171.0 lb

## 2017-12-09 DIAGNOSIS — Z01812 Encounter for preprocedural laboratory examination: Secondary | ICD-10-CM | POA: Diagnosis not present

## 2017-12-09 DIAGNOSIS — I4891 Unspecified atrial fibrillation: Secondary | ICD-10-CM

## 2017-12-09 DIAGNOSIS — R5383 Other fatigue: Secondary | ICD-10-CM | POA: Diagnosis not present

## 2017-12-09 MED ORDER — METOPROLOL SUCCINATE ER 100 MG PO TB24: 100.0000 mg | ORAL_TABLET | Freq: Every day | ORAL | 3 refills | Status: DC

## 2017-12-09 NOTE — Addendum Note (Signed)
Addended by: Vennie Homans on: 12/09/2017 09:07 AM   Modules accepted: Orders

## 2017-12-09 NOTE — Patient Instructions (Addendum)
Medication Instructions:  Continue current medications  If you need a refill on your cardiac medications before your next appointment, please call your pharmacy.  Labwork: None Ordered    Testing/Procedures: Your physician has recommended that you wear a 24 hour holter monitor. Holter monitors are medical devices that record the heart's electrical activity. Doctors most often use these monitors to diagnose arrhythmias. Arrhythmias are problems with the speed or rhythm of the heartbeat. The monitor is a small, portable device. You can wear one while you do your normal daily activities. This is usually used to diagnose what is causing palpitations/syncope (passing out).  Your physician has requested that you have an echocardiogram. Echocardiography is a painless test that uses sound waves to create images of your heart. It provides your doctor with information about the size and shape of your heart and how well your heart's chambers and valves are working. This procedure takes approximately one hour. There are no restrictions for this procedure.  Your physician has recommended that you have a Cardioversion (DCCV) in 3 weeks. Electrical Cardioversion uses a jolt of electricity to your heart either through paddles or wired patches attached to your chest. This is a controlled, usually prescheduled, procedure. Defibrillation is done under light anesthesia in the hospital, and you usually go home the day of the procedure. This is done to get your heart back into a normal rhythm. You are not awake for the procedure. Please see the instruction sheet given to you today.  Dear  Mrs Valene Villa are scheduled for a Cardioversion on          with Dr.                .  Please arrive at the [facility] (Main Entrance A) at [facility]: [place], [place] [address] at       am/pm. (1 hour prior to procedure unless lab work is needed; if lab work is needed arrive 1.5 hours ahead)  DIET:  Nothing to eat or drink after midnight except a sip of water with medications (see medication instructions below)  Medication Instructions: Hold None  Continue your anticoagulant: Eliquis You will need to continue your anticoagulant after your procedure until you  are told by your  Provider that it is safe to stop   Labs: If patient is on Coumadin, patient needs pt/INR, CBC, BMET within 3 days (No pt/INR needed for patients taking Xarelto, Eliquis, Pradaxa) For patients receiving anesthesia for TEE and all Cardioversion patients: BMET, CBC within 1 week  Come to: [facility] [address]   You must have a responsible person to drive you home and stay in the waiting area during your procedure. Failure to do so could result in cancellation.  Bring your insurance cards.  *Special Note: Every effort is made to have your procedure done on time. Occasionally there are emergencies that occur at the hospital that may cause delays. Please be patient if a delay does occur.    Follow-Up: Your physician wants you to follow-up in: After Cardioversion.     Thank you for choosing [facility] at [facility]!!

## 2017-12-17 ENCOUNTER — Ambulatory Visit (HOSPITAL_COMMUNITY): Payer: Medicare HMO | Attending: Cardiology

## 2017-12-17 ENCOUNTER — Ambulatory Visit (INDEPENDENT_AMBULATORY_CARE_PROVIDER_SITE_OTHER): Payer: Medicare HMO

## 2017-12-17 ENCOUNTER — Other Ambulatory Visit: Payer: Self-pay

## 2017-12-17 ENCOUNTER — Telehealth: Payer: Self-pay | Admitting: Cardiology

## 2017-12-17 DIAGNOSIS — E785 Hyperlipidemia, unspecified: Secondary | ICD-10-CM | POA: Diagnosis not present

## 2017-12-17 DIAGNOSIS — I119 Hypertensive heart disease without heart failure: Secondary | ICD-10-CM | POA: Insufficient documentation

## 2017-12-17 DIAGNOSIS — I4891 Unspecified atrial fibrillation: Secondary | ICD-10-CM | POA: Insufficient documentation

## 2017-12-17 MED ORDER — RIVAROXABAN 15 MG PO TABS: 15.0000 mg | ORAL_TABLET | Freq: Every day | ORAL | 6 refills | Status: DC

## 2017-12-17 NOTE — Telephone Encounter (Signed)
Spoke with pt, she was started on eliquis one week ago and she is covered in hives this morning. She will take benadryl for the hives and she was instructed not to take the eliquis this monring and we can switch her to xarelto. Will forward to pharm md to help with dosing. Pt agreed with this plan.

## 2017-12-17 NOTE — Telephone Encounter (Signed)
Based on Creatinine Clearance she will need 15 mg once daily with food.

## 2017-12-17 NOTE — Telephone Encounter (Signed)
Spoke with pt, aware of medication change. She will start with the first dose of xarelto tonight. New script sent to the pharmacy

## 2017-12-17 NOTE — Telephone Encounter (Signed)
Left message for pt to call.

## 2017-12-17 NOTE — Telephone Encounter (Signed)
Agree 

## 2017-12-17 NOTE — Telephone Encounter (Signed)
rash on the: last night for 1 day.

## 2017-12-19 ENCOUNTER — Telehealth: Payer: Self-pay | Admitting: Cardiology

## 2017-12-19 NOTE — Telephone Encounter (Signed)
New Message   Patient is returning call in reference to echocardiogram. Please call to discuss.

## 2017-12-20 NOTE — Telephone Encounter (Signed)
New message  Pt verbalized that she is calling again for RN   in reference to echocardiogram

## 2017-12-20 NOTE — Telephone Encounter (Signed)
Left message for pt to call.

## 2017-12-20 NOTE — Telephone Encounter (Signed)
Spoke with pt, aware of echo results. 

## 2017-12-20 NOTE — Telephone Encounter (Signed)
Phone rings busy

## 2017-12-24 DIAGNOSIS — I4891 Unspecified atrial fibrillation: Secondary | ICD-10-CM | POA: Diagnosis not present

## 2017-12-24 DIAGNOSIS — R5383 Other fatigue: Secondary | ICD-10-CM | POA: Diagnosis not present

## 2017-12-24 DIAGNOSIS — Z01812 Encounter for preprocedural laboratory examination: Secondary | ICD-10-CM | POA: Diagnosis not present

## 2017-12-25 LAB — BASIC METABOLIC PANEL
BUN / CREAT RATIO: 16 (ref 12–28)
BUN: 19 mg/dL (ref 8–27)
CALCIUM: 9.4 mg/dL (ref 8.7–10.3)
CHLORIDE: 106 mmol/L (ref 96–106)
CO2: 24 mmol/L (ref 20–29)
Creatinine, Ser: 1.16 mg/dL — ABNORMAL HIGH (ref 0.57–1.00)
GFR calc non Af Amer: 46 mL/min/{1.73_m2} — ABNORMAL LOW (ref >59)
GFR, EST AFRICAN AMERICAN: 53 mL/min/{1.73_m2} — AB (ref >59)
Glucose: 101 mg/dL — ABNORMAL HIGH (ref 65–99)
Potassium: 3.9 mmol/L (ref 3.5–5.2)
Sodium: 145 mmol/L — ABNORMAL HIGH (ref 134–144)

## 2017-12-25 LAB — TSH: TSH: 0.528 u[IU]/mL (ref 0.450–4.500)

## 2017-12-25 LAB — CBC
Hematocrit: 45.7 % (ref 34.0–46.6)
Hemoglobin: 15.1 g/dL (ref 11.1–15.9)
MCH: 30.3 pg (ref 26.6–33.0)
MCHC: 33 g/dL (ref 31.5–35.7)
MCV: 92 fL (ref 79–97)
PLATELETS: 206 10*3/uL (ref 150–379)
RBC: 4.99 x10E6/uL (ref 3.77–5.28)
RDW: 13.3 % (ref 12.3–15.4)
WBC: 10.2 10*3/uL (ref 3.4–10.8)

## 2017-12-30 ENCOUNTER — Other Ambulatory Visit: Payer: Self-pay | Admitting: Cardiology

## 2017-12-31 ENCOUNTER — Ambulatory Visit (HOSPITAL_COMMUNITY)
Admission: RE | Admit: 2017-12-31 | Discharge: 2017-12-31 | Disposition: A | Discharge status: Home / Self Care | Payer: Medicare HMO | Source: Ambulatory Visit | Attending: Cardiovascular Disease | Admitting: Cardiovascular Disease

## 2017-12-31 ENCOUNTER — Ambulatory Visit (HOSPITAL_COMMUNITY): Payer: Medicare HMO | Admitting: Anesthesiology

## 2017-12-31 ENCOUNTER — Encounter (HOSPITAL_COMMUNITY)
Admission: RE | Disposition: A | Discharge status: Home / Self Care | Payer: Self-pay | Source: Ambulatory Visit | Attending: Cardiovascular Disease

## 2017-12-31 ENCOUNTER — Encounter (HOSPITAL_COMMUNITY): Payer: Self-pay | Admitting: *Deleted

## 2017-12-31 DIAGNOSIS — Z79899 Other long term (current) drug therapy: Secondary | ICD-10-CM | POA: Insufficient documentation

## 2017-12-31 DIAGNOSIS — M1990 Unspecified osteoarthritis, unspecified site: Secondary | ICD-10-CM | POA: Insufficient documentation

## 2017-12-31 DIAGNOSIS — I481 Persistent atrial fibrillation: Secondary | ICD-10-CM | POA: Diagnosis not present

## 2017-12-31 DIAGNOSIS — G7000 Myasthenia gravis without (acute) exacerbation: Secondary | ICD-10-CM | POA: Diagnosis not present

## 2017-12-31 DIAGNOSIS — Z888 Allergy status to other drugs, medicaments and biological substances status: Secondary | ICD-10-CM | POA: Diagnosis not present

## 2017-12-31 DIAGNOSIS — I341 Nonrheumatic mitral (valve) prolapse: Secondary | ICD-10-CM | POA: Diagnosis not present

## 2017-12-31 DIAGNOSIS — N183 Chronic kidney disease, stage 3 (moderate): Secondary | ICD-10-CM | POA: Insufficient documentation

## 2017-12-31 DIAGNOSIS — E785 Hyperlipidemia, unspecified: Secondary | ICD-10-CM | POA: Insufficient documentation

## 2017-12-31 DIAGNOSIS — Z8719 Personal history of other diseases of the digestive system: Secondary | ICD-10-CM | POA: Insufficient documentation

## 2017-12-31 DIAGNOSIS — E039 Hypothyroidism, unspecified: Secondary | ICD-10-CM | POA: Diagnosis not present

## 2017-12-31 DIAGNOSIS — I129 Hypertensive chronic kidney disease with stage 1 through stage 4 chronic kidney disease, or unspecified chronic kidney disease: Secondary | ICD-10-CM | POA: Insufficient documentation

## 2017-12-31 DIAGNOSIS — I4819 Other persistent atrial fibrillation: Secondary | ICD-10-CM

## 2017-12-31 DIAGNOSIS — Z7901 Long term (current) use of anticoagulants: Secondary | ICD-10-CM | POA: Insufficient documentation

## 2017-12-31 DIAGNOSIS — J45909 Unspecified asthma, uncomplicated: Secondary | ICD-10-CM | POA: Insufficient documentation

## 2017-12-31 DIAGNOSIS — Z885 Allergy status to narcotic agent status: Secondary | ICD-10-CM | POA: Insufficient documentation

## 2017-12-31 DIAGNOSIS — Z7982 Long term (current) use of aspirin: Secondary | ICD-10-CM | POA: Diagnosis not present

## 2017-12-31 DIAGNOSIS — R000 Tachycardia, unspecified: Secondary | ICD-10-CM | POA: Diagnosis not present

## 2017-12-31 DIAGNOSIS — I4891 Unspecified atrial fibrillation: Secondary | ICD-10-CM | POA: Insufficient documentation

## 2017-12-31 DIAGNOSIS — K449 Diaphragmatic hernia without obstruction or gangrene: Secondary | ICD-10-CM | POA: Diagnosis not present

## 2017-12-31 DIAGNOSIS — M810 Age-related osteoporosis without current pathological fracture: Secondary | ICD-10-CM | POA: Insufficient documentation

## 2017-12-31 HISTORY — DX: Adverse effect of unspecified anesthetic, initial encounter: T41.45XA

## 2017-12-31 HISTORY — DX: Other complications of anesthesia, initial encounter: T88.59XA

## 2017-12-31 HISTORY — PX: CARDIOVERSION: SHX1299

## 2017-12-31 SURGERY — CARDIOVERSION
Anesthesia: General

## 2017-12-31 MED ORDER — LIDOCAINE HCL (CARDIAC) 20 MG/ML IV SOLN
INTRAVENOUS | Status: DC | PRN
  Administered 2017-12-31: 80 mg via INTRAVENOUS

## 2017-12-31 MED ORDER — PROPOFOL 10 MG/ML IV BOLUS
INTRAVENOUS | Status: DC | PRN
  Administered 2017-12-31: 50 mg via INTRAVENOUS
  Administered 2017-12-31: 100 mg via INTRAVENOUS

## 2017-12-31 MED ORDER — SODIUM CHLORIDE 0.9 % IV SOLN
INTRAVENOUS | Status: DC | PRN
  Administered 2017-12-31: 13:00:00 via INTRAVENOUS

## 2017-12-31 NOTE — Anesthesia Postprocedure Evaluation (Signed)
Anesthesia Post Note  Patient: Christina Jacobs  Procedure(s) Performed: CARDIOVERSION (N/A )     Patient location during evaluation: PACU Anesthesia Type: General Level of consciousness: awake and alert Pain management: pain level controlled Vital Signs Assessment: post-procedure vital signs reviewed and stable Respiratory status: spontaneous breathing, nonlabored ventilation, respiratory function stable and patient connected to nasal cannula oxygen Cardiovascular status: blood pressure returned to baseline and stable Postop Assessment: no apparent nausea or vomiting Anesthetic complications: no    Last Vitals:  Vitals:   12/31/17 1320 12/31/17 1330  BP: 106/70 120/67  Pulse: (!) 107 94  Resp: (!) 23 18  Temp:    SpO2: 96% 96%    Last Pain:  Vitals:   12/31/17 1317  TempSrc: Oral                 Ryan P Ellender

## 2017-12-31 NOTE — Discharge Instructions (Signed)
Electrical Cardioversion, Care After °This sheet gives you information about how to care for yourself after your procedure. Your health care provider may also give you more specific instructions. If you have problems or questions, contact your health care provider. °What can I expect after the procedure? °After the procedure, it is common to have: °· Some redness on the skin where the shocks were given. ° °Follow these instructions at home: °· Do not drive for 24 hours if you were given a medicine to help you relax (sedative). °· Take over-the-counter and prescription medicines only as told by your health care provider. °· Ask your health care provider how to check your pulse. Check it often. °· Rest for 48 hours after the procedure or as told by your health care provider. °· Avoid or limit your caffeine use as told by your health care provider. °Contact a health care provider if: °· You feel like your heart is beating too quickly or your pulse is not regular. °· You have a serious muscle cramp that does not go away. °Get help right away if: °· You have discomfort in your chest. °· You are dizzy or you feel faint. °· You have trouble breathing or you are short of breath. °· Your speech is slurred. °· You have trouble moving an arm or leg on one side of your body. °· Your fingers or toes turn cold or blue. °This information is not intended to replace advice given to you by your health care provider. Make sure you discuss any questions you have with your health care provider. °Document Released: 08/12/2013 Document Revised: 05/25/2016 Document Reviewed: 04/27/2016 °Elsevier Interactive Patient Education © 2018 [employer] Inc[employer] ° °

## 2017-12-31 NOTE — CV Procedure (Signed)
DCC: Anesthesia:  Ellender Propofol  DCC x 4 Once at 150 J biphasic 3 times at 200J bipasic  Afib rate 110 Failed to convert  Will need to f/u with Dr Percival Spanish and afib clinic to start AAT  Jenkins Rouge

## 2017-12-31 NOTE — Transfer of Care (Signed)
Immediate Anesthesia Transfer of Care Note  Patient: Christina Jacobs  Procedure(s) Performed: CARDIOVERSION (N/A )  Patient Location: Endoscopy Unit  Anesthesia Type:General  Level of Consciousness: awake, alert  and oriented  Airway & Oxygen Therapy: Patient Spontanous Breathing and Patient connected to nasal cannula oxygen  Post-op Assessment: Report given to RN and Post -op Vital signs reviewed and stable  Post vital signs: Reviewed and stable  Last Vitals:  Vitals:   12/31/17 1315 12/31/17 1316  BP:    Pulse: (!) 102 (!) 109  Resp: (!) 22 (!) 23  Temp:    SpO2: 97% 97%    Last Pain:  Vitals:   12/31/17 1140  TempSrc: Oral         Complications: No apparent anesthesia complications

## 2017-12-31 NOTE — Anesthesia Preprocedure Evaluation (Addendum)
Anesthesia Evaluation  Patient identified by MRN, date of birth, ID band Patient awake    Reviewed: Allergy & Precautions, NPO status , Patient's Chart, lab work & pertinent test results, reviewed documented beta blocker date and time   Airway Mallampati: II  TM Distance: >3 FB Neck ROM: Full    Dental no notable dental hx.    Pulmonary asthma ,    Pulmonary exam normal breath sounds clear to auscultation       Cardiovascular hypertension, Pt. on home beta blockers + dysrhythmias Atrial Fibrillation  Rhythm:Irregular Rate:Tachycardia  ECG: a-fib, rate 98  ECHO: LV EF: 55% - 60%   Neuro/Psych Myasthenia gravis  Neuromuscular disease negative psych ROS   GI/Hepatic Neg liver ROS, hiatal hernia,   Endo/Other  Hypothyroidism   Renal/GU Renal disease     Musculoskeletal negative musculoskeletal ROS (+)   Abdominal   Peds  Hematology HLD   Anesthesia Other Findings   Reproductive/Obstetrics                            Anesthesia Physical Anesthesia Plan  ASA: IV  Anesthesia Plan: General   Post-op Pain Management:    Induction: Intravenous  PONV Risk Score and Plan: 3 and Propofol infusion and Treatment may vary due to age or medical condition  Airway Management Planned: Mask  Additional Equipment:   Intra-op Plan:   Post-operative Plan:   Informed Consent: I have reviewed the patients History and Physical, chart, labs and discussed the procedure including the risks, benefits and alternatives for the proposed anesthesia with the patient or authorized representative who has indicated his/her understanding and acceptance.   Dental advisory given  Plan Discussed with: CRNA  Anesthesia Plan Comments:        Anesthesia Quick Evaluation

## 2017-12-31 NOTE — Interval H&P Note (Signed)
History and Physical Interval Note:  12/31/2017 1:13 PM  Christina Jacobs  has presented today for surgery, with the diagnosis of AFIB  The various methods of treatment have been discussed with the patient and family. After consideration of risks, benefits and other options for treatment, the patient has consented to  Procedure(s): CARDIOVERSION (N/A) as a surgical intervention .  The patient's history has been reviewed, patient examined, no change in status, stable for surgery.  I have reviewed the patient's chart and labs.  Questions were answered to the patient's satisfaction.     Jenkins Rouge

## 2018-01-02 ENCOUNTER — Encounter (HOSPITAL_COMMUNITY): Payer: Self-pay | Admitting: Cardiovascular Disease

## 2018-01-02 ENCOUNTER — Other Ambulatory Visit: Payer: Self-pay | Admitting: Family Medicine

## 2018-01-02 NOTE — Telephone Encounter (Signed)
This medication refill request was denied today. Per 12/17/17 phone notes, patient had allergic reaction to Eliquis and was changed to Xarelto by cardiology.

## 2018-01-06 ENCOUNTER — Ambulatory Visit: Payer: Medicare HMO | Admitting: Cardiology

## 2018-01-06 ENCOUNTER — Encounter (HOSPITAL_COMMUNITY): Payer: Self-pay | Admitting: Nurse Practitioner

## 2018-01-06 ENCOUNTER — Ambulatory Visit (HOSPITAL_COMMUNITY)
Admission: RE | Admit: 2018-01-06 | Discharge: 2018-01-06 | Disposition: A | Discharge status: Home / Self Care | Payer: Medicare HMO | Source: Ambulatory Visit | Attending: Nurse Practitioner | Admitting: Nurse Practitioner

## 2018-01-06 VITALS — BP 122/62 | HR 93 | Ht 66.0 in | Wt 169.0 lb

## 2018-01-06 DIAGNOSIS — Z7951 Long term (current) use of inhaled steroids: Secondary | ICD-10-CM | POA: Insufficient documentation

## 2018-01-06 DIAGNOSIS — M1990 Unspecified osteoarthritis, unspecified site: Secondary | ICD-10-CM | POA: Diagnosis not present

## 2018-01-06 DIAGNOSIS — I129 Hypertensive chronic kidney disease with stage 1 through stage 4 chronic kidney disease, or unspecified chronic kidney disease: Secondary | ICD-10-CM | POA: Insufficient documentation

## 2018-01-06 DIAGNOSIS — N183 Chronic kidney disease, stage 3 (moderate): Secondary | ICD-10-CM | POA: Insufficient documentation

## 2018-01-06 DIAGNOSIS — Z885 Allergy status to narcotic agent status: Secondary | ICD-10-CM | POA: Diagnosis not present

## 2018-01-06 DIAGNOSIS — G7000 Myasthenia gravis without (acute) exacerbation: Secondary | ICD-10-CM | POA: Insufficient documentation

## 2018-01-06 DIAGNOSIS — Z8249 Family history of ischemic heart disease and other diseases of the circulatory system: Secondary | ICD-10-CM | POA: Diagnosis not present

## 2018-01-06 DIAGNOSIS — J45909 Unspecified asthma, uncomplicated: Secondary | ICD-10-CM | POA: Insufficient documentation

## 2018-01-06 DIAGNOSIS — Z7901 Long term (current) use of anticoagulants: Secondary | ICD-10-CM | POA: Diagnosis not present

## 2018-01-06 DIAGNOSIS — E785 Hyperlipidemia, unspecified: Secondary | ICD-10-CM | POA: Diagnosis not present

## 2018-01-06 DIAGNOSIS — Z79899 Other long term (current) drug therapy: Secondary | ICD-10-CM | POA: Diagnosis not present

## 2018-01-06 DIAGNOSIS — I4819 Other persistent atrial fibrillation: Secondary | ICD-10-CM

## 2018-01-06 DIAGNOSIS — Z888 Allergy status to other drugs, medicaments and biological substances status: Secondary | ICD-10-CM | POA: Insufficient documentation

## 2018-01-06 DIAGNOSIS — Z833 Family history of diabetes mellitus: Secondary | ICD-10-CM | POA: Insufficient documentation

## 2018-01-06 DIAGNOSIS — E039 Hypothyroidism, unspecified: Secondary | ICD-10-CM | POA: Insufficient documentation

## 2018-01-06 DIAGNOSIS — Z800 Family history of malignant neoplasm of digestive organs: Secondary | ICD-10-CM | POA: Insufficient documentation

## 2018-01-06 DIAGNOSIS — Z79890 Hormone replacement therapy (postmenopausal): Secondary | ICD-10-CM | POA: Diagnosis not present

## 2018-01-06 DIAGNOSIS — H409 Unspecified glaucoma: Secondary | ICD-10-CM | POA: Insufficient documentation

## 2018-01-06 DIAGNOSIS — Z8051 Family history of malignant neoplasm of kidney: Secondary | ICD-10-CM | POA: Insufficient documentation

## 2018-01-06 DIAGNOSIS — I4891 Unspecified atrial fibrillation: Secondary | ICD-10-CM | POA: Diagnosis present

## 2018-01-06 DIAGNOSIS — I481 Persistent atrial fibrillation: Secondary | ICD-10-CM | POA: Insufficient documentation

## 2018-01-06 DIAGNOSIS — Z90710 Acquired absence of both cervix and uterus: Secondary | ICD-10-CM | POA: Insufficient documentation

## 2018-01-06 NOTE — Progress Notes (Signed)
Primary Care Physician: Laurey Morale, MD Referring Physician: Dr.  Bronwen Betters Christina Jacobs is a 77 y.o. female with a h/o new onset Afib dx'ed by PCP in early February. She was placed on Eliquis and BB was increased. She has felt palpitations for some time and it had been more significant over the 3 weeks prior to afib being diagnosed.She was set up for cardioversion after anticoagulated x 3 weeks and unfortunately would not shock out with 4 shocks. She is in the clinic with her husband  to discuss antiarrythmic's.  Today, she denies symptoms of palpitations, chest pain, shortness of breath, orthopnea, PND, lower extremity edema, dizziness, presyncope, syncope, or neurologic sequela. The patient is tolerating medications without difficulties and is otherwise without complaint today.   Past Medical History:  Diagnosis Date  . Adenomatous colon polyp   . Anal fissure   . Asthma    Dr. Halford Chessman  . Bowel obstruction ([place])   . Bursitis   . CKD (chronic kidney disease) stage 3, GFR 30-59 ml/min (HCC)    sees Dr. [name]   . Complication of anesthesia    post op N/V  . Diverticulosis   . Glaucoma   . Hiatal hernia    sees Dr. [name] at [facility]   . Hyperlipidemia   . Hypertension   . Hypothyroidism   . Myasthenia gravis ([place]) 2013   followed at [facility] Dr Scheryl Marten, diplopia only  . Osteoarthritis    see's Dr. [name]  . Osteoporosis    last DEXA 08-03-11   Past Surgical History:  Procedure Laterality Date  . ABDOMINAL HYSTERECTOMY    . APPENDECTOMY    . BREAST LUMPECTOMY Bilateral     several, enign breast lumps removed  . CARDIOVERSION N/A 12/31/2017   Procedure: CARDIOVERSION;  Surgeon: Josue Hector, MD;  Location: [facility] ENDOSCOPY;  Service: Cardiovascular;  Laterality: N/A;  . HERNIA REPAIR  10-18-10   Dr. Hassell Done attempted but could reduce this, the entire stomach is above the diaphragm  . THYROIDECTOMY, PARTIAL  1999  . TONSILLECTOMY      Current  Outpatient Medications  Medication Sig Dispense Refill  . atorvastatin (LIPITOR) 10 MG tablet Take 1 tablet (10 mg total) by mouth daily. 90 tablet 3  . budesonide (PULMICORT FLEXHALER) 180 MCG/ACT inhaler Inhale 1 puff into the lungs daily as needed (for shortness of breath or wheezing).     Marland Kitchen levothyroxine (SYNTHROID, LEVOTHROID) 100 MCG tablet Take 1 tablet (100 mcg total) by mouth daily. 90 tablet 3  . Melatonin 5 MG CAPS Take 5 mg by mouth at bedtime as needed (for sleep).    . metoprolol succinate (TOPROL-XL) 100 MG 24 hr tablet Take 1 tablet (100 mg total) by mouth daily. Take with or immediately following a meal. (Patient taking differently: Take 50-100 mg by mouth See admin instructions. Take 100 mg by mouth in the morning and take 50 mg by mouth at bedtime) 90 tablet 3  . multivitamin-iron-minerals-folic acid (CENTRUM) chewable tablet Chew 1 tablet by mouth daily.    . predniSONE (DELTASONE) 1 MG tablet Take 4 mg by mouth daily.    . psyllium (METAMUCIL) 58.6 % powder Take 1 packet by mouth daily as needed (for constipation).     . Rivaroxaban (XARELTO) 15 MG TABS tablet Take 1 tablet (15 mg total) by mouth daily with supper. 30 tablet 6  . Travoprost, BAK Free, (TRAVATAN) 0.004 % SOLN ophthalmic solution Place 1 drop  into both eyes at bedtime.    . triamterene-hydrochlorothiazide (DYAZIDE) 37.5-25 MG capsule Take 1 each (1 capsule total) by mouth daily. 90 capsule 3   No current facility-administered medications for this encounter.     Allergies  Allergen Reactions  . Codeine Hives  . Eliquis [Apixaban] Hives  . Propoxyphene Hcl Hives  . Meperidine Hcl Rash    Social History   Socioeconomic History  . Marital status: Married    Spouse name: Not on file  . Number of children: 3  . Years of education: Not on file  . Highest education level: Not on file  Social Needs  . Financial resource strain: Not on file  . Food insecurity - worry: Not on file  . Food insecurity -  inability: Not on file  . Transportation needs - medical: Not on file  . Transportation needs - non-medical: Not on file  Occupational History  . Occupation: retired    Comment: [employer]  Tobacco Use  . Smoking status: Never Smoker  . Smokeless tobacco: Never Used  Substance and Sexual Activity  . Alcohol use: No    Alcohol/week: 0.0 oz  . Drug use: No  . Sexual activity: Not on file  Other Topics Concern  . Not on file  Social History Narrative   Married       Family History  Problem Relation Age of Onset  . Breast cancer Maternal Grandmother   . Diabetes Father   . Hyperlipidemia Father   . Hypertension Father   . Cancer Father        origin unknown  . Kidney cancer Mother   . Clotting disorder Brother   . Cancer Sister        2015, anal ca    ROS- All systems are reviewed and negative except as per the HPI above  Physical Exam: Vitals:   01/06/18 1328  BP: 122/62  Pulse: 93  Weight: 169 lb (76.7 kg)  Height: 5\' 6"  (1.676 m)   Wt Readings from Last 3 Encounters:  01/06/18 169 lb (76.7 kg)  12/31/17 171 lb (77.6 kg)  12/09/17 171 lb (77.6 kg)    Labs: Lab Results  Component Value Date   NA 145 (H) 12/24/2017   K 3.9 12/24/2017   CL 106 12/24/2017   CO2 24 12/24/2017   GLUCOSE 101 (H) 12/24/2017   BUN 19 12/24/2017   CREATININE 1.16 (H) 12/24/2017   CALCIUM 9.4 12/24/2017   No results found for: INR Lab Results  Component Value Date   CHOL 165 08/09/2017   HDL 75.40 08/09/2017   LDLCALC 71 08/09/2017   TRIG 93.0 08/09/2017     GEN- The patient is well appearing, alert and oriented x 3 today.   Head- normocephalic, atraumatic Eyes-  Sclera clear, conjunctiva pink Ears- hearing intact Oropharynx- clear Neck- supple, no JVP Lymph- no cervical lymphadenopathy Lungs- Clear to ausculation bilaterally, normal work of breathing Heart- irregular rate and rhythm, no murmurs, rubs or gallops, PMI not laterally displaced GI- soft, NT, ND, +  BS Extremities- no clubbing, cyanosis, or edema MS- no significant deformity or atrophy Skin- no rash or lesion Psych- euthymic mood, full affect Neuro- strength and sensation are intact  EKG- afib at 93 bpm, v rate 93 bpm, qrs int 92 ms, qtc 440 ms  Epic records reviewed Echo-Study Conclusions  - Left ventricle: The cavity size was normal. There was mild focal   basal hypertrophy of the septum. Systolic function was normal.  The estimated ejection fraction was in the range of 55% to 60%.   The study was not technically sufficient to allow evaluation of   LV diastolic dysfunction due to atrial fibrillation. - Pulmonary arteries: PA peak pressure: 33 mm Hg (S).    Assessment and Plan: 1.Persistent afib Failed cardioversion Discussed afib and management and triggers No significant triggers identified Discussed antiarrythmic's pro/cons of each, multaq, flecainide, amiodarone, Tikosyn, sotalol I think flecainide might be a good choice but will need a stress test to determine no significant CAD Echo showed no significant structural heart disease   2. Chadsvasc score of at least 4 Had allergic reaction to eliquis Now on xarelto 15 mg for crcl cal at 49.93 Reminded not to miss any doses of xarelto  I informed pt that I would inform Dr. Percival Spanish of our discussion and if he agreed with the plan, I would call her and put in place.  Geroge Baseman Beola Vasallo, [facility] [address] [place], [place] [address] [phone]

## 2018-01-08 ENCOUNTER — Telehealth: Payer: Self-pay

## 2018-01-08 DIAGNOSIS — I4819 Other persistent atrial fibrillation: Secondary | ICD-10-CM

## 2018-01-08 MED ORDER — FLECAINIDE ACETATE 50 MG PO TABS: 50.0000 mg | ORAL_TABLET | Freq: Two times a day (BID) | ORAL | 11 refills | Status: DC

## 2018-01-08 NOTE — Telephone Encounter (Signed)
Instructions given to patient at husband's office visit.  Per Dr. Johnsie Cancel start flecainide 50mg  bid and follow up with GXT in 2 weeks

## 2018-01-21 ENCOUNTER — Telehealth: Payer: Self-pay | Admitting: Radiology

## 2018-01-21 ENCOUNTER — Ambulatory Visit: Payer: Medicare HMO

## 2018-01-21 NOTE — Telephone Encounter (Signed)
Patient was here today to have her Flecainide GXT. Treadmill ended up being canceled today. Patient held her Toprol Xl and Flecainide this morning. Also patient was still in afib with a rate of 89. She is currently on flecainide 50mg  bid. Treadmill will be rescheduled.

## 2018-01-21 NOTE — Telephone Encounter (Signed)
Patient is rescheduled for GXT in a few weeks. Patient is aware to keep taking her flecainide and not to hold it for her GXT. Will forward to Dr. Johnsie Cancel.

## 2018-01-23 ENCOUNTER — Telehealth: Payer: Self-pay

## 2018-01-23 ENCOUNTER — Telehealth: Payer: Self-pay | Admitting: Cardiovascular Disease

## 2018-01-23 NOTE — Telephone Encounter (Signed)
New Message  Pt c/o medication issue:  1. Name of Medication: flecainide (TAMBOCOR) 50 MG tablet  2. How are you currently taking this medication (dosage and times per day)? Take 1 tablet (50 mg total) by mouth 2 (two) times daily  3. Are you having a reaction (difficulty breathing--STAT)? no  4. What is your medication issue? Pt states she was suppose to have a stress test but couldn't because she is still in Afib and she was suppose to be contacted about changing meds. Please call

## 2018-01-23 NOTE — Telephone Encounter (Signed)
Patient is requesting to switch from Dr. Alroy Bailiff to Dr. Johnsie Cancel. Patient's husband is currently being seen by Dr. Johnsie Cancel, and would like to be seen at the same time. Will forward to both doctors to see if they agree to change.

## 2018-01-23 NOTE — Telephone Encounter (Signed)
Patient was in A. FIB at time of ETT and was unable to do the test. Patient was told that she might need her medications adjusted. Patient is rescheduled for ETT next Friday. Patient is currently taking Flecainide 50 mg BID. Patient is worried about coming in for ETT and being in A. FIB again. Informed patient to make sure she takes her Flecainide. Will forward to Dr. Johnsie Cancel for advisement.

## 2018-01-23 NOTE — Telephone Encounter (Signed)
This is Hochrein's patient still She was just started on flecainide  She can have ETT even if in afib She just needs to take beta blocker and AAT morning of her procedure Does not have to be in NSR for treadmill

## 2018-01-23 NOTE — Telephone Encounter (Signed)
Called patient back. Spoke with patient's husband (DPR) and informed him of Dr. Kyla Balzarine instructions. Patient's husband verbalized understanding.

## 2018-01-24 NOTE — Telephone Encounter (Signed)
Will send back to Amelia Jo until Dr Johnsie Cancel answers then pt can be scheduled appropriately.

## 2018-01-24 NOTE — Telephone Encounter (Signed)
OK with me.

## 2018-01-30 NOTE — Telephone Encounter (Signed)
That is fine 

## 2018-01-31 ENCOUNTER — Ambulatory Visit (INDEPENDENT_AMBULATORY_CARE_PROVIDER_SITE_OTHER): Payer: Medicare HMO

## 2018-01-31 DIAGNOSIS — I481 Persistent atrial fibrillation: Secondary | ICD-10-CM

## 2018-01-31 NOTE — Telephone Encounter (Signed)
OK to schedule with Dr Johnsie Cancel - will route to scheduling pool yo contact pt and schedule appt.

## 2018-02-03 ENCOUNTER — Ambulatory Visit: Payer: Medicare HMO | Admitting: Cardiovascular Disease

## 2018-02-03 ENCOUNTER — Encounter: Payer: Self-pay | Admitting: Cardiovascular Disease

## 2018-02-03 ENCOUNTER — Other Ambulatory Visit (HOSPITAL_COMMUNITY): Payer: Self-pay | Admitting: Cardiovascular Disease

## 2018-02-03 VITALS — BP 120/86 | HR 91 | Ht 66.0 in | Wt 171.5 lb

## 2018-02-03 DIAGNOSIS — I481 Persistent atrial fibrillation: Secondary | ICD-10-CM

## 2018-02-03 DIAGNOSIS — I4819 Other persistent atrial fibrillation: Secondary | ICD-10-CM

## 2018-02-03 LAB — CBC WITH DIFFERENTIAL/PLATELET
BASOS ABS: 0.1 10*3/uL (ref 0.0–0.2)
Basos: 1 %
EOS (ABSOLUTE): 0.1 10*3/uL (ref 0.0–0.4)
Eos: 1 %
HEMOGLOBIN: 15.1 g/dL (ref 11.1–15.9)
Hematocrit: 44.7 % (ref 34.0–46.6)
IMMATURE GRANS (ABS): 0 10*3/uL (ref 0.0–0.1)
Immature Granulocytes: 0 %
LYMPHS: 28 %
Lymphocytes Absolute: 2.7 10*3/uL (ref 0.7–3.1)
MCH: 31.1 pg (ref 26.6–33.0)
MCHC: 33.8 g/dL (ref 31.5–35.7)
MCV: 92 fL (ref 79–97)
Monocytes Absolute: 0.8 10*3/uL (ref 0.1–0.9)
Monocytes: 8 %
Neutrophils Absolute: 6 10*3/uL (ref 1.4–7.0)
Neutrophils: 62 %
Platelets: 178 10*3/uL (ref 150–379)
RBC: 4.85 x10E6/uL (ref 3.77–5.28)
RDW: 13.9 % (ref 12.3–15.4)
WBC: 9.7 10*3/uL (ref 3.4–10.8)

## 2018-02-03 LAB — EXERCISE TOLERANCE TEST
CHL CUP MPHR: 144 {beats}/min
CHL RATE OF PERCEIVED EXERTION: 15
CSEPEDS: 27 s
CSEPHR: 93 %
Estimated workload: 5.1 METS
Exercise duration (min): 3 min
Peak HR: 134 {beats}/min
Rest HR: 80 {beats}/min

## 2018-02-03 LAB — PROTIME-INR
INR: 1.3 — ABNORMAL HIGH (ref 0.8–1.2)
Prothrombin Time: 13.2 s — ABNORMAL HIGH (ref 9.1–12.0)

## 2018-02-03 LAB — BASIC METABOLIC PANEL
BUN / CREAT RATIO: 15 (ref 12–28)
BUN: 22 mg/dL (ref 8–27)
CO2: 22 mmol/L (ref 20–29)
Calcium: 9.3 mg/dL (ref 8.7–10.3)
Chloride: 104 mmol/L (ref 96–106)
Creatinine, Ser: 1.42 mg/dL — ABNORMAL HIGH (ref 0.57–1.00)
GFR, EST AFRICAN AMERICAN: 41 mL/min/{1.73_m2} — AB (ref >59)
GFR, EST NON AFRICAN AMERICAN: 36 mL/min/{1.73_m2} — AB (ref >59)
Glucose: 74 mg/dL (ref 65–99)
POTASSIUM: 3.5 mmol/L (ref 3.5–5.2)
SODIUM: 142 mmol/L (ref 134–144)

## 2018-02-03 MED ORDER — RIVAROXABAN 15 MG PO TABS: 15.0000 mg | ORAL_TABLET | Freq: Every day | ORAL | 3 refills | Status: DC

## 2018-02-03 NOTE — Addendum Note (Signed)
Addended by: Aris Georgia, Niomie Englert L on: 02/03/2018 09:07 AM   Modules accepted: Orders

## 2018-02-03 NOTE — Progress Notes (Signed)
77 y.o. wife of one of my patients. Previously seen by Dr Percival Spanish. New to me. Noted to be in afib by PCP 12/2017  Rx with Eliquis and beta blocker ? Allergic reaction to Eliquis on Xarelto now  12/31/17 I performed her [facility] and she failed to convert with 4 defibrillations Started on flecainide ETT done 01/31/18 reviewed and normal with no pro arrhythmia or QRS widening. Echo reviewed 12/17/17 EF 55-60% otherwise normal. She has a diagnosis of CRF but last GFR was 53 with Cr 1.16 12/24/17  She still feels palpitations compliant with meds including xarelto. Discussed repeat [facility] Willing to proceed have scheduled for 4/9 2:00 with me at [facility] called orders written  Will have labs Monday before procedure   Past Medical History:  Diagnosis Date  . Adenomatous colon polyp   . Anal fissure   . Asthma    Dr. Halford Chessman  . Bowel obstruction ([place])   . Bursitis   . CKD (chronic kidney disease) stage 3, GFR 30-59 ml/min (HCC)    sees Dr. [name]   . Complication of anesthesia    post op N/V  . Diverticulosis   . Glaucoma   . Hiatal hernia    sees Dr. [name] at [facility]   . Hyperlipidemia   . Hypertension   . Hypothyroidism   . Myasthenia gravis ([place]) 2013   followed at [facility] Dr Scheryl Marten, diplopia only  . Osteoarthritis    see's Dr. [name]  . Osteoporosis    last DEXA 08-03-11   Past Surgical History:  Procedure Laterality Date  . ABDOMINAL HYSTERECTOMY    . APPENDECTOMY    . BREAST LUMPECTOMY Bilateral     several, enign breast lumps removed  . CARDIOVERSION N/A 12/31/2017   Procedure: CARDIOVERSION;  Surgeon: Josue Hector, MD;  Location: [facility] END[facility]COPY;  Service: Cardiovascular;  Laterality: N/A;  . HERNIA REPAIR  10-18-10   Dr. Hassell Done attempted but could reduce this, the entire stomach is above the diaphragm  . THYROIDECTOMY, PARTIAL  1999  . TONSILLECTOMY      Current Outpatient Medications  Medication Sig Dispense Refill  . atorvastatin (LIPITOR) 10 MG  tablet Take 1 tablet (10 mg total) by mouth daily. 90 tablet 3  . budesonide (PULMICORT FLEXHALER) 180 MCG/ACT inhaler Inhale 1 puff into the lungs daily as needed (for shortness of breath or wheezing).     . flecainide (TAMBOCOR) 50 MG tablet Take 1 tablet (50 mg total) by mouth 2 (two) times daily. 60 tablet 11  . levothyroxine (SYNTHROID, LEVOTHROID) 100 MCG tablet Take 1 tablet (100 mcg total) by mouth daily. 90 tablet 3  . Melatonin 5 MG CAPS Take 5 mg by mouth at bedtime as needed (for sleep).    . metoprolol succinate (TOPROL-XL) 100 MG 24 hr tablet Take 100 mg by mouth daily. 1 tab po in am, 0.5 tab po in pm.  Take with or immediately following a meal.    . multivitamin-iron-minerals-folic acid (CENTRUM) chewable tablet Chew 1 tablet by mouth daily.    . predniSONE (DELTASONE) 1 MG tablet Take 4 mg by mouth daily.    . psyllium (METAMUCIL) 58.6 % powder Take 1 packet by mouth daily as needed (for constipation).     . Rivaroxaban (XARELTO) 15 MG TABS tablet Take 1 tablet (15 mg total) by mouth daily with supper. 30 tablet 6  . Travoprost, BAK Free, (TRAVATAN) 0.004 % SOLN ophthalmic solution Place 1 drop into both eyes  at bedtime.    . triamterene-hydrochlorothiazide (DYAZIDE) 37.5-25 MG capsule Take 1 each (1 capsule total) by mouth daily. 90 capsule 3   No current facility-administered medications for this visit.     Allergies  Allergen Reactions  . Codeine Hives  . Eliquis [Apixaban] Hives  . Propoxyphene Hcl Hives  . Meperidine Hcl Rash    Social History   Socioeconomic History  . Marital status: Married    Spouse name: Not on file  . Number of children: 3  . Years of education: Not on file  . Highest education level: Not on file  Occupational History  . Occupation: retired    Comment: [employer]  Social Needs  . Financial resource strain: Not on file  . Food insecurity:    Worry: Not on file    Inability: Not on file  . Transportation needs:    Medical: Not on file     Non-medical: Not on file  Tobacco Use  . Smoking status: Never Smoker  . Smokeless tobacco: Never Used  Substance and Sexual Activity  . Alcohol use: No    Alcohol/week: 0.0 oz  . Drug use: No  . Sexual activity: Not on file  Lifestyle  . Physical activity:    Days per week: Not on file    Minutes per session: Not on file  . Stress: Not on file  Relationships  . Social connections:    Talks on phone: Not on file    Gets together: Not on file    Attends religious service: Not on file    Active member of club or organization: Not on file    Attends meetings of clubs or organizations: Not on file    Relationship status: Not on file  . Intimate partner violence:    Fear of current or ex partner: Not on file    Emotionally abused: Not on file    Physically abused: Not on file    Forced sexual activity: Not on file  Other Topics Concern  . Not on file  Social History Narrative   Married       Family History  Problem Relation Age of Onset  . Breast cancer Maternal Grandmother   . Diabetes Father   . Hyperlipidemia Father   . Hypertension Father   . Cancer Father        origin unknown  . Kidney cancer Mother   . Clotting disorder Brother   . Cancer Sister        2015, anal ca    ROS- All systems are reviewed and negative except as per the HPI above  Physical Exam: Vitals:   02/03/18 0823  BP: 120/86  Pulse: 91  SpO2: 97%  Weight: 171 lb 8 oz (77.8 kg)  Height: 5\' 6"  (1.676 m)   Wt Readings from Last 3 Encounters:  02/03/18 171 lb 8 oz (77.8 kg)  01/06/18 169 lb (76.7 kg)  12/31/17 171 lb (77.6 kg)    Labs: Lab Results  Component Value Date   NA 145 (H) 12/24/2017   K 3.9 12/24/2017   CL 106 12/24/2017   CO2 24 12/24/2017   GLUCOSE 101 (H) 12/24/2017   BUN 19 12/24/2017   CREATININE 1.16 (H) 12/24/2017   CALCIUM 9.4 12/24/2017   No results found for: INR Lab Results  Component Value Date   CHOL 165 08/09/2017   HDL 75.40 08/09/2017    LDLCALC 71 08/09/2017   TRIG 93.0 08/09/2017  BP 120/86   Pulse 91   Ht 5\' 6"  (1.676 m)   Wt 171 lb 8 oz (77.8 kg)   SpO2 97%   BMI 27.68 kg/m  Affect appropriate Healthy:  appears stated age 28: normal Neck supple with no adenopathy JVP normal no bruits no thyromegaly Lungs clear with no wheezing and good diaphragmatic motion Heart:  S1/S2 no murmur, no rub, gallop or click PMI normal Abdomen: benighn, BS positve, no tenderness, no AAA no bruit.  No HSM or HJR Distal pulses intact with no bruits No edema Neuro non-focal Skin warm and dry No muscular weakness  Echo-Study Conclusions  - Left ventricle: The cavity size was normal. There was mild focal   basal hypertrophy of the septum. Systolic function was normal.   The estimated ejection fraction was in the range of 55% to 60%.   The study was not technically sufficient to allow evaluation of   LV diastolic dysfunction due to atrial fibrillation. - Pulmonary arteries: PA peak pressure: 33 mm Hg (S).    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 Chadsvasc score of at least 4  She has had CrCl less than 50 so has been placed on 15 mg dose of Xarelto  3. CRF:  Stable avoid over diuresis    Jenkins Rouge

## 2018-02-03 NOTE — H&P (View-Only) (Signed)
77 y.o. wife of one of my patients. Previously seen by Dr Percival Spanish. New to me. Noted to be in afib by PCP 12/2017  Rx with Eliquis and beta blocker ? Allergic reaction to Eliquis on Xarelto now  12/31/17 I performed her [facility] and she failed to convert with 4 defibrillations Started on flecainide ETT done 01/31/18 reviewed and normal with no pro arrhythmia or QRS widening. Echo reviewed 12/17/17 EF 55-60% otherwise normal. She has a diagnosis of CRF but last GFR was 53 with Cr 1.16 12/24/17  She still feels palpitations compliant with meds including xarelto. Discussed repeat [facility] Willing to proceed have scheduled for 4/9 2:00 with me at [facility] called orders written  Will have labs Monday before procedure   Past Medical History:  Diagnosis Date  . Adenomatous colon polyp   . Anal fissure   . Asthma    Dr. Halford Chessman  . Bowel obstruction ([place])   . Bursitis   . CKD (chronic kidney disease) stage 3, GFR 30-59 ml/min (HCC)    sees Dr. [name]   . Complication of anesthesia    post op N/V  . Diverticulosis   . Glaucoma   . Hiatal hernia    sees Dr. [name] at [facility]   . Hyperlipidemia   . Hypertension   . Hypothyroidism   . Myasthenia gravis ([place]) 2013   followed at [facility] Dr Scheryl Marten, diplopia only  . Osteoarthritis    see's Dr. [name]  . Osteoporosis    last DEXA 08-03-11   Past Surgical History:  Procedure Laterality Date  . ABDOMINAL HYSTERECTOMY    . APPENDECTOMY    . BREAST LUMPECTOMY Bilateral     several, enign breast lumps removed  . CARDIOVERSION N/A 12/31/2017   Procedure: CARDIOVERSION;  Surgeon: Josue Hector, MD;  Location: [facility] ENDOSCOPY;  Service: Cardiovascular;  Laterality: N/A;  . HERNIA REPAIR  10-18-10   Dr. Hassell Done attempted but could reduce this, the entire stomach is above the diaphragm  . THYROIDECTOMY, PARTIAL  1999  . TONSILLECTOMY      Current Outpatient Medications  Medication Sig Dispense Refill  . atorvastatin (LIPITOR) 10 MG  tablet Take 1 tablet (10 mg total) by mouth daily. 90 tablet 3  . budesonide (PULMICORT FLEXHALER) 180 MCG/ACT inhaler Inhale 1 puff into the lungs daily as needed (for shortness of breath or wheezing).     . flecainide (TAMBOCOR) 50 MG tablet Take 1 tablet (50 mg total) by mouth 2 (two) times daily. 60 tablet 11  . levothyroxine (SYNTHROID, LEVOTHROID) 100 MCG tablet Take 1 tablet (100 mcg total) by mouth daily. 90 tablet 3  . Melatonin 5 MG CAPS Take 5 mg by mouth at bedtime as needed (for sleep).    . metoprolol succinate (TOPROL-XL) 100 MG 24 hr tablet Take 100 mg by mouth daily. 1 tab po in am, 0.5 tab po in pm.  Take with or immediately following a meal.    . multivitamin-iron-minerals-folic acid (CENTRUM) chewable tablet Chew 1 tablet by mouth daily.    . predniSONE (DELTASONE) 1 MG tablet Take 4 mg by mouth daily.    . psyllium (METAMUCIL) 58.6 % powder Take 1 packet by mouth daily as needed (for constipation).     . Rivaroxaban (XARELTO) 15 MG TABS tablet Take 1 tablet (15 mg total) by mouth daily with supper. 30 tablet 6  . Travoprost, BAK Free, (TRAVATAN) 0.004 % SOLN ophthalmic solution Place 1 drop into both eyes  at bedtime.    . triamterene-hydrochlorothiazide (DYAZIDE) 37.5-25 MG capsule Take 1 each (1 capsule total) by mouth daily. 90 capsule 3   No current facility-administered medications for this visit.     Allergies  Allergen Reactions  . Codeine Hives  . Eliquis [Apixaban] Hives  . Propoxyphene Hcl Hives  . Meperidine Hcl Rash    Social History   Socioeconomic History  . Marital status: Married    Spouse name: Not on file  . Number of children: 3  . Years of education: Not on file  . Highest education level: Not on file  Occupational History  . Occupation: retired    Comment: [employer]  Social Needs  . Financial resource strain: Not on file  . Food insecurity:    Worry: Not on file    Inability: Not on file  . Transportation needs:    Medical: Not on file     Non-medical: Not on file  Tobacco Use  . Smoking status: Never Smoker  . Smokeless tobacco: Never Used  Substance and Sexual Activity  . Alcohol use: No    Alcohol/week: 0.0 oz  . Drug use: No  . Sexual activity: Not on file  Lifestyle  . Physical activity:    Days per week: Not on file    Minutes per session: Not on file  . Stress: Not on file  Relationships  . Social connections:    Talks on phone: Not on file    Gets together: Not on file    Attends religious service: Not on file    Active member of club or organization: Not on file    Attends meetings of clubs or organizations: Not on file    Relationship status: Not on file  . Intimate partner violence:    Fear of current or ex partner: Not on file    Emotionally abused: Not on file    Physically abused: Not on file    Forced sexual activity: Not on file  Other Topics Concern  . Not on file  Social History Narrative   Married       Family History  Problem Relation Age of Onset  . Breast cancer Maternal Grandmother   . Diabetes Father   . Hyperlipidemia Father   . Hypertension Father   . Cancer Father        origin unknown  . Kidney cancer Mother   . Clotting disorder Brother   . Cancer Sister        2015, anal ca    ROS- All systems are reviewed and negative except as per the HPI above  Physical Exam: Vitals:   02/03/18 0823  BP: 120/86  Pulse: 91  SpO2: 97%  Weight: 171 lb 8 oz (77.8 kg)  Height: 5\' 6"  (1.676 m)   Wt Readings from Last 3 Encounters:  02/03/18 171 lb 8 oz (77.8 kg)  01/06/18 169 lb (76.7 kg)  12/31/17 171 lb (77.6 kg)    Labs: Lab Results  Component Value Date   NA 145 (H) 12/24/2017   K 3.9 12/24/2017   CL 106 12/24/2017   CO2 24 12/24/2017   GLUCOSE 101 (H) 12/24/2017   BUN 19 12/24/2017   CREATININE 1.16 (H) 12/24/2017   CALCIUM 9.4 12/24/2017   No results found for: INR Lab Results  Component Value Date   CHOL 165 08/09/2017   HDL 75.40 08/09/2017    LDLCALC 71 08/09/2017   TRIG 93.0 08/09/2017  BP 120/86   Pulse 91   Ht 5\' 6"  (1.676 m)   Wt 171 lb 8 oz (77.8 kg)   SpO2 97%   BMI 27.68 kg/m  Affect appropriate Healthy:  appears stated age 30: normal Neck supple with no adenopathy JVP normal no bruits no thyromegaly Lungs clear with no wheezing and good diaphragmatic motion Heart:  S1/S2 no murmur, no rub, gallop or click PMI normal Abdomen: benighn, BS positve, no tenderness, no AAA no bruit.  No HSM or HJR Distal pulses intact with no bruits No edema Neuro non-focal Skin warm and dry No muscular weakness  Echo-Study Conclusions  - Left ventricle: The cavity size was normal. There was mild focal   basal hypertrophy of the septum. Systolic function was normal.   The estimated ejection fraction was in the range of 55% to 60%.   The study was not technically sufficient to allow evaluation of   LV diastolic dysfunction due to atrial fibrillation. - Pulmonary arteries: PA peak pressure: 33 mm Hg (S).    Assessment and Plan: 1.Persistent afib now on flecainide with negative ETT repeat [facility] on AAT 02/11/18   2. Chadsvasc score of at least 4  She has had CrCl less than 50 so has been placed on 15 mg dose of Xarelto  3. CRF:  Stable avoid over diuresis    Jenkins Rouge

## 2018-02-03 NOTE — Patient Instructions (Addendum)
Medication Instructions:  Your physician recommends that you continue on your current medications as directed. Please refer to the Current Medication list given to you today.  Labwork: Your physician recommends that you have lab work today- BMET, CBC, PT/INR  Testing/Procedures: Your physician has recommended that you have a Cardioversion (DCCV). Electrical Cardioversion uses a jolt of electricity to your heart either through paddles or wired patches attached to your chest. This is a controlled, usually prescheduled, procedure. Defibrillation is done under light anesthesia in the hospital, and you usually go home the day of the procedure. This is done to get your heart back into a normal rhythm. You are not awake for the procedure. Please see the instruction sheet given to you today.  Follow-Up: Your physician recommends that you schedule a follow-up appointment in: 2 weeks with [facility].  Your physician recommends that you schedule a follow-up appointment in: 6 to 8 weeks with Dr. Johnsie Cancel.    If you need a refill on your cardiac medications before your next appointment, please call your pharmacy.

## 2018-02-04 ENCOUNTER — Telehealth: Payer: Self-pay | Admitting: Cardiovascular Disease

## 2018-02-04 NOTE — Telephone Encounter (Signed)
Called patient back. Informed patient to take her medications as prescribed. Patient wanted to know if she needed to take an extra dose of  xarelto the morning of her cardioversion. Informed patient to take her xarelto in the evenings, like usual, and not to take an extra dose. Patient verbalized understanding.

## 2018-02-04 NOTE — Telephone Encounter (Signed)
New Message    Patient is scheduled to have a cardioversion  On 02/11/2018. She would like to discuss how she should take her medication. Please call to discuss.

## 2018-02-05 ENCOUNTER — Encounter: Payer: Self-pay | Admitting: Cardiovascular Disease

## 2018-02-11 ENCOUNTER — Encounter (HOSPITAL_COMMUNITY): Payer: Self-pay | Admitting: *Deleted

## 2018-02-11 ENCOUNTER — Ambulatory Visit (HOSPITAL_COMMUNITY): Payer: Medicare HMO | Admitting: Anesthesiology

## 2018-02-11 ENCOUNTER — Encounter (HOSPITAL_COMMUNITY)
Admission: RE | Disposition: A | Discharge status: Home / Self Care | Payer: Self-pay | Source: Ambulatory Visit | Attending: Cardiovascular Disease

## 2018-02-11 ENCOUNTER — Ambulatory Visit (HOSPITAL_COMMUNITY)
Admission: RE | Admit: 2018-02-11 | Discharge: 2018-02-11 | Disposition: A | Discharge status: Home / Self Care | Payer: Medicare HMO | Source: Ambulatory Visit | Attending: Cardiovascular Disease | Admitting: Cardiovascular Disease

## 2018-02-11 DIAGNOSIS — M810 Age-related osteoporosis without current pathological fracture: Secondary | ICD-10-CM | POA: Insufficient documentation

## 2018-02-11 DIAGNOSIS — Z7951 Long term (current) use of inhaled steroids: Secondary | ICD-10-CM | POA: Insufficient documentation

## 2018-02-11 DIAGNOSIS — I481 Persistent atrial fibrillation: Secondary | ICD-10-CM | POA: Diagnosis not present

## 2018-02-11 DIAGNOSIS — E039 Hypothyroidism, unspecified: Secondary | ICD-10-CM | POA: Diagnosis not present

## 2018-02-11 DIAGNOSIS — N183 Chronic kidney disease, stage 3 (moderate): Secondary | ICD-10-CM | POA: Insufficient documentation

## 2018-02-11 DIAGNOSIS — M1990 Unspecified osteoarthritis, unspecified site: Secondary | ICD-10-CM | POA: Insufficient documentation

## 2018-02-11 DIAGNOSIS — E785 Hyperlipidemia, unspecified: Secondary | ICD-10-CM | POA: Diagnosis not present

## 2018-02-11 DIAGNOSIS — I129 Hypertensive chronic kidney disease with stage 1 through stage 4 chronic kidney disease, or unspecified chronic kidney disease: Secondary | ICD-10-CM | POA: Insufficient documentation

## 2018-02-11 DIAGNOSIS — K219 Gastro-esophageal reflux disease without esophagitis: Secondary | ICD-10-CM | POA: Diagnosis not present

## 2018-02-11 DIAGNOSIS — J45909 Unspecified asthma, uncomplicated: Secondary | ICD-10-CM | POA: Diagnosis not present

## 2018-02-11 HISTORY — PX: CARDIOVERSION: SHX1299

## 2018-02-11 SURGERY — CARDIOVERSION
Anesthesia: General

## 2018-02-11 MED ORDER — LIDOCAINE HCL (CARDIAC) 20 MG/ML IV SOLN
INTRAVENOUS | Status: DC | PRN
  Administered 2018-02-11: 60 mg via INTRAVENOUS

## 2018-02-11 MED ORDER — PROPOFOL 10 MG/ML IV BOLUS
INTRAVENOUS | Status: DC | PRN
  Administered 2018-02-11: 100 mg via INTRAVENOUS

## 2018-02-11 MED ORDER — SODIUM CHLORIDE 0.9 % IV SOLN
INTRAVENOUS | Status: DC | PRN
  Administered 2018-02-11: 13:00:00 via INTRAVENOUS

## 2018-02-11 NOTE — Interval H&P Note (Signed)
History and Physical Interval Note:  02/11/2018 1:01 PM  Christina Jacobs  has presented today for surgery, with the diagnosis of AFIB  The various methods of treatment have been discussed with the patient and family. After consideration of risks, benefits and other options for treatment, the patient has consented to  Procedure(s): CARDIOVERSION (N/A) as a surgical intervention .  The patient's history has been reviewed, patient examined, no change in status, stable for surgery.  I have reviewed the patient's chart and labs.  Questions were answered to the patient's satisfaction.     Jenkins Rouge

## 2018-02-11 NOTE — CV Procedure (Signed)
Anesthesia Dr Tobias Alexander On Rx xarelto no missed doses On Flecainide  Used conventional paddles as patient had skin reaction with patches before  [place] x 2 150 J then 200Joules  Converted from afib rate 100 to NSR rate 58 No immediate neurologic sequelae  Jenkins Rouge

## 2018-02-11 NOTE — Discharge Instructions (Signed)
Electrical Cardioversion, Care After °This sheet gives you information about how to care for yourself after your procedure. Your health care provider may also give you more specific instructions. If you have problems or questions, contact your health care provider. °What can I expect after the procedure? °After the procedure, it is common to have: °· Some redness on the skin where the shocks were given. ° °Follow these instructions at home: °· Do not drive for 24 hours if you were given a medicine to help you relax (sedative). °· Take over-the-counter and prescription medicines only as told by your health care provider. °· Ask your health care provider how to check your pulse. Check it often. °· Rest for 48 hours after the procedure or as told by your health care provider. °· Avoid or limit your caffeine use as told by your health care provider. °Contact a health care provider if: °· You feel like your heart is beating too quickly or your pulse is not regular. °· You have a serious muscle cramp that does not go away. °Get help right away if: °· You have discomfort in your chest. °· You are dizzy or you feel faint. °· You have trouble breathing or you are short of breath. °· Your speech is slurred. °· You have trouble moving an arm or leg on one side of your body. °· Your fingers or toes turn cold or blue. °This information is not intended to replace advice given to you by your health care provider. Make sure you discuss any questions you have with your health care provider. °Document Released: 08/12/2013 Document Revised: 05/25/2016 Document Reviewed: 04/27/2016 °Elsevier Interactive Patient Education © 2018 [employer] Inc[employer] ° °

## 2018-02-11 NOTE — Anesthesia Preprocedure Evaluation (Signed)
Anesthesia Evaluation  Patient identified by MRN, date of birth, ID band Patient awake    Reviewed: Allergy & Precautions, NPO status , Patient's Chart, lab work & pertinent test results, reviewed documented beta blocker date and time   History of Anesthesia Complications (+) PONV and history of anesthetic complications  Airway Mallampati: II  TM Distance: >3 FB Neck ROM: Full    Dental no notable dental hx. (+) Dental Advisory Given   Pulmonary asthma ,    Pulmonary exam normal breath sounds clear to auscultation       Cardiovascular hypertension, Pt. on home beta blockers + dysrhythmias Atrial Fibrillation  Rhythm:Irregular Rate:Tachycardia  ECG: a-fib, rate 98  ECHO: LV EF: 55% - 60%   Neuro/Psych Myasthenia gravis  Neuromuscular disease negative psych ROS   GI/Hepatic Neg liver ROS, hiatal hernia,   Endo/Other  Hypothyroidism   Renal/GU Renal disease     Musculoskeletal negative musculoskeletal ROS (+)   Abdominal   Peds  Hematology HLD   Anesthesia Other Findings   Reproductive/Obstetrics                             Anesthesia Physical  Anesthesia Plan  ASA: III  Anesthesia Plan: General   Post-op Pain Management:    Induction: Intravenous  PONV Risk Score and Plan: 3 and Propofol infusion and Treatment may vary due to age or medical condition  Airway Management Planned: Mask  Additional Equipment:   Intra-op Plan:   Post-operative Plan:   Informed Consent: I have reviewed the patients History and Physical, chart, labs and discussed the procedure including the risks, benefits and alternatives for the proposed anesthesia with the patient or authorized representative who has indicated his/her understanding and acceptance.   Dental advisory given  Plan Discussed with: CRNA and Anesthesiologist  Anesthesia Plan Comments:         Anesthesia Quick Evaluation

## 2018-02-11 NOTE — Anesthesia Postprocedure Evaluation (Signed)
Anesthesia Post Note  Patient: Christina Jacobs  Procedure(s) Performed: CARDIOVERSION (N/A )     Patient location during evaluation: PACU Anesthesia Type: General Level of consciousness: sedated Pain management: pain level controlled Vital Signs Assessment: post-procedure vital signs reviewed and stable Respiratory status: spontaneous breathing and respiratory function stable Cardiovascular status: stable Postop Assessment: no apparent nausea or vomiting Anesthetic complications: no    Last Vitals:  Vitals:   02/11/18 1345 02/11/18 1358  BP: (!) 89/55 (!) 96/58  Pulse: (!) 52 68  Resp: 17 18  Temp:    SpO2: 96% 94%    Last Pain:  Vitals:   02/11/18 1358  TempSrc:   PainSc: 0-No pain                 Xandra Laramee DANIEL

## 2018-02-11 NOTE — Transfer of Care (Signed)
Immediate Anesthesia Transfer of Care Note  Patient: Christina Jacobs  Procedure(s) Performed: CARDIOVERSION (N/A )  Patient Location: Endoscopy Unit  Anesthesia Type:General  Level of Consciousness: awake, alert  and oriented  Airway & Oxygen Therapy: Patient Spontanous Breathing and Patient connected to nasal cannula oxygen  Post-op Assessment: Report given to RN, Post -op Vital signs reviewed and stable and Patient moving all extremities  Post vital signs: Reviewed and stable  Last Vitals:  Vitals Value Taken Time  BP    Temp    Pulse 57 02/11/2018  1:41 PM  Resp 25 02/11/2018  1:41 PM  SpO2 97 % 02/11/2018  1:41 PM  Vitals shown include unvalidated device data.  Last Pain:  Vitals:   02/11/18 1243  TempSrc: Oral  PainSc: 0-No pain         Complications: No apparent anesthesia complications

## 2018-02-13 ENCOUNTER — Encounter (HOSPITAL_COMMUNITY): Payer: Self-pay | Admitting: Cardiovascular Disease

## 2018-02-14 ENCOUNTER — Ambulatory Visit: Payer: Medicare HMO | Admitting: Pulmonary Disease

## 2018-02-14 ENCOUNTER — Encounter: Payer: Self-pay | Admitting: Pulmonary Disease

## 2018-02-14 VITALS — BP 116/78 | HR 56 | Ht 66.0 in | Wt 172.8 lb

## 2018-02-14 DIAGNOSIS — J4520 Mild intermittent asthma, uncomplicated: Secondary | ICD-10-CM | POA: Diagnosis not present

## 2018-02-14 NOTE — Progress Notes (Signed)
[facility]  Chief Complaint  Patient presents with  . Follow-up    asthma-no recent flare ups, no longer on home O2    Vital signs: BP 116/78 (BP Location: Left Arm, Cuff Size: Normal)   Pulse (!) 56   Ht 5\' 6"  (1.676 m)   Wt 172 lb 12.8 oz (78.4 kg)   SpO2 93%   BMI 27.89 kg/m   History of Present Illness: Christina Jacobs is a 77 y.o. female with asthma, hiatal hernia, and myasthenia gravis.  No longer needing oxygen.  Denies cough, wheeze, sputum.  Hasn't need inhalers for a while.  On chronic prednisone for MG.  Physical Exam:  General - pleasant Eyes - pupils reactive ENT - no sinus tenderness, no oral exudate, no LAN Cardiac - regular, no murmur Chest - no wheeze, rales Abd - soft, non tender Ext - no edema Skin - no rashes Neuro - normal strength Psych - normal mood  Assessment/Plan:  Mild, intermittent asthma. 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 - don't think surgery would be good option for her  Myasthenia gravis. - followed at [facility]    Patient Instructions  Follow up as needed    Chesley Mires, MD [place] 02/14/2018, 1:51 PM  Flow Sheet  Pulmonary tests: PFT 11/07/16 >> FEV1 1.58 (69%), FEV1% 80, TLC 6.77 (126%), DLCO 46%  Sleep tests:  Cardiac tests: Echo 12/17/17 >> EF 55 to 60%, PAS 33 mmHg  Review of Systems:   Past Medical History: She  has a past medical history of Adenomatous colon polyp, Anal fissure, Asthma, Bowel obstruction ([place]), Bursitis, CKD (chronic kidney disease) stage 3, GFR 30-59 ml/min ([place]), Complication of anesthesia, Diverticulosis, Glaucoma, Hiatal hernia, Hyperlipidemia, Hypertension, Hypothyroidism, Myasthenia gravis ([place]) (2013), Osteoarthritis, and Osteoporosis.  Past Surgical History: She  has a past surgical history that includes  Appendectomy; Abdominal hysterectomy; Tonsillectomy; Breast lumpectomy (Bilateral); Thyroidectomy, partial (1999); Hernia repair (10-18-10); Cardioversion (N/A, 12/31/2017); and Cardioversion (N/A, 02/11/2018).  Family History: Her family history includes Breast cancer in her maternal grandmother; Cancer in her father and sister; Clotting disorder in her brother; Diabetes in her father; Hyperlipidemia in her father; Hypertension in her father; Kidney cancer in her mother.  Social History: She  reports that she has never smoked. She has never used smokeless tobacco. She reports that she does not drink alcohol or use drugs.  Medications: Allergies as of 02/14/2018      Reactions   Codeine Hives   Eliquis [apixaban] Hives   Propoxyphene Hcl Hives   Adhesive [tape] Rash, Other (See Comments)   EKG patches   Meperidine Hcl Rash      Medication List        Accurate as of 02/14/18  1:51 PM. Always use your most recent med list.          albuterol 108 (90 Base) MCG/ACT inhaler Commonly known as:  PROVENTIL HFA;VENTOLIN HFA Inhale 2 puffs into the lungs every 6 (six) hours as needed for wheezing or shortness of breath.   atorvastatin 10 MG tablet Commonly known as:  LIPITOR Take 1 tablet (10 mg total) by mouth daily.   flecainide 50 MG tablet Commonly known as:  TAMBOCOR Take 1 tablet (50 mg total) by mouth 2 (two) times daily.   levothyroxine 100 MCG tablet Commonly known as:  SYNTHROID, LEVOTHROID Take 1  tablet (100 mcg total) by mouth daily.   Melatonin 5 MG Caps Take 5 mg by mouth at bedtime as needed (for sleep).   metoprolol succinate 100 MG 24 hr tablet Commonly known as:  TOPROL-XL Take 50-100 mg by mouth See admin instructions. Take 100 mg by mouth in the morning and take 50 mg by mouth in the evening. Take with or immediately following a meal.   multivitamin-iron-minerals-folic acid chewable tablet Chew 1 tablet by mouth daily.   predniSONE 1 MG tablet Commonly known  as:  DELTASONE Take 4 mg by mouth daily.   psyllium 58.6 % powder Commonly known as:  METAMUCIL Take 1 packet by mouth daily as needed (for constipation).   PULMICORT FLEXHALER 180 MCG/ACT inhaler Generic drug:  budesonide Inhale 1 puff into the lungs daily as needed (for shortness of breath or wheezing).   Rivaroxaban 15 MG Tabs tablet Commonly known as:  XARELTO Take 1 tablet (15 mg total) by mouth daily with supper.   Travoprost (BAK Free) 0.004 % Soln ophthalmic solution Commonly known as:  TRAVATAN Place 1 drop into both eyes at bedtime.   triamterene-hydrochlorothiazide 37.5-25 MG capsule Commonly known as:  DYAZIDE Take 1 each (1 capsule total) by mouth daily.

## 2018-02-14 NOTE — Patient Instructions (Signed)
Follow up as needed

## 2018-02-25 ENCOUNTER — Encounter (HOSPITAL_COMMUNITY): Payer: Self-pay | Admitting: Nurse Practitioner

## 2018-02-25 ENCOUNTER — Ambulatory Visit (HOSPITAL_COMMUNITY)
Admission: RE | Admit: 2018-02-25 | Discharge: 2018-02-25 | Disposition: A | Discharge status: Home / Self Care | Payer: Medicare HMO | Source: Ambulatory Visit | Attending: Nurse Practitioner | Admitting: Nurse Practitioner

## 2018-02-25 VITALS — BP 132/74 | HR 54 | Ht 66.0 in | Wt 168.0 lb

## 2018-02-25 DIAGNOSIS — Z9889 Other specified postprocedural states: Secondary | ICD-10-CM | POA: Insufficient documentation

## 2018-02-25 DIAGNOSIS — I481 Persistent atrial fibrillation: Secondary | ICD-10-CM | POA: Diagnosis not present

## 2018-02-25 DIAGNOSIS — N183 Chronic kidney disease, stage 3 (moderate): Secondary | ICD-10-CM | POA: Diagnosis not present

## 2018-02-25 DIAGNOSIS — I129 Hypertensive chronic kidney disease with stage 1 through stage 4 chronic kidney disease, or unspecified chronic kidney disease: Secondary | ICD-10-CM | POA: Insufficient documentation

## 2018-02-25 DIAGNOSIS — Z90710 Acquired absence of both cervix and uterus: Secondary | ICD-10-CM | POA: Insufficient documentation

## 2018-02-25 DIAGNOSIS — Z79899 Other long term (current) drug therapy: Secondary | ICD-10-CM | POA: Insufficient documentation

## 2018-02-25 DIAGNOSIS — I4819 Other persistent atrial fibrillation: Secondary | ICD-10-CM

## 2018-02-25 NOTE — Progress Notes (Signed)
Primary Care Physician: Laurey Morale, MD Referring Physician: Dr. Harolyn Rutherford Christina Jacobs is a 77 y.o. female with a h/o new onset Afib dx'ed by PCP in early February. She was placed on Eliquis and BB was increased. She has felt palpitations for some time and it had been more significant over the 3 weeks prior to afib being diagnosed.She was set up for cardioversion after anticoagulated x 3 weeks and unfortunately would not shock out with 4 shocks. She is in the clinic with her husband  to discuss antiarrythmic's.  She  ultimately was started on flecainide 50 mg bid and has successful cardioversion 02/11/18. She is maintaining SR. HR in the 50's but not symptomatic, had some heart racing last week for about one hour.   Today, she denies symptoms of palpitations, chest pain, shortness of breath, orthopnea, PND, lower extremity edema, dizziness, presyncope, syncope, or neurologic sequela. The patient is tolerating medications without difficulties and is otherwise without complaint today.   Past Medical History:  Diagnosis Date  . Adenomatous colon polyp   . Anal fissure   . Asthma    Dr. Halford Chessman  . Bowel obstruction ([place])   . Bursitis   . CKD (chronic kidney disease) stage 3, GFR 30-59 ml/min ([facility])    sees Dr. [name]   . Complication of anesthesia    post op N/V  . Diverticulosis   . Glaucoma   . Hiatal hernia    sees Dr. [name] at [facility]   . Hyperlipidemia   . Hypertension   . Hypothyroidism   . Myasthenia gravis ([place]) 2013   followed at [facility] Dr Scheryl Marten, diplopia only  . Osteoarthritis    see's Dr. [name]  . Osteoporosis    last DEXA 08-03-11   Past Surgical History:  Procedure Laterality Date  . ABDOMINAL HYSTERECTOMY    . APPENDECTOMY    . BREAST LUMPECTOMY Bilateral     several, enign breast lumps removed  . CARDIOVERSION N/A 12/31/2017   Procedure: CARDIOVERSION;  Surgeon: Josue Hector, MD;  Location: [facility] ENDOSCOPY;  Service:  Cardiovascular;  Laterality: N/A;  . CARDIOVERSION N/A 02/11/2018   Procedure: CARDIOVERSION;  Surgeon: Josue Hector, MD;  Location: [facility] ENDOSCOPY;  Service: Cardiovascular;  Laterality: N/A;  . HERNIA REPAIR  10-18-10   Dr. Hassell Done attempted but could reduce this, the entire stomach is above the diaphragm  . THYROIDECTOMY, PARTIAL  1999  . TONSILLECTOMY      Current Outpatient Medications  Medication Sig Dispense Refill  . budesonide (PULMICORT FLEXHALER) 180 MCG/ACT inhaler Inhale 1 puff into the lungs daily as needed (for shortness of breath or wheezing).     . flecainide (TAMBOCOR) 50 MG tablet Take 1 tablet (50 mg total) by mouth 2 (two) times daily. 60 tablet 11  . levothyroxine (SYNTHROID, LEVOTHROID) 100 MCG tablet Take 1 tablet (100 mcg total) by mouth daily. 90 tablet 3  . Melatonin 5 MG CAPS Take 5 mg by mouth at bedtime as needed (for sleep).    . metoprolol succinate (TOPROL-XL) 100 MG 24 hr tablet Take 50-100 mg by mouth See admin instructions. Take 100 mg by mouth in the morning and take 50 mg by mouth in the evening. Take with or immediately following a meal.    . multivitamin-iron-minerals-folic acid (CENTRUM) chewable tablet Chew 1 tablet by mouth daily.    . predniSONE (DELTASONE) 1 MG tablet Take 4 mg by mouth daily.    . psyllium (  METAMUCIL) 58.6 % powder Take 1 packet by mouth daily as needed (for constipation).     . Rivaroxaban (XARELTO) 15 MG TABS tablet Take 1 tablet (15 mg total) by mouth daily with supper. 90 tablet 3  . Travoprost, BAK Free, (TRAVATAN) 0.004 % SOLN ophthalmic solution Place 1 drop into both eyes at bedtime.    . triamterene-hydrochlorothiazide (DYAZIDE) 37.5-25 MG capsule Take 1 each (1 capsule total) by mouth daily. 90 capsule 3  . albuterol (PROVENTIL HFA;VENTOLIN HFA) 108 (90 Base) MCG/ACT inhaler Inhale 2 puffs into the lungs every 6 (six) hours as needed for wheezing or shortness of breath.    Marland Kitchen atorvastatin (LIPITOR) 10 MG tablet Take 1 tablet  (10 mg total) by mouth daily. 90 tablet 3   No current facility-administered medications for this encounter.     Allergies  Allergen Reactions  . Codeine Hives  . Eliquis [Apixaban] Hives  . Propoxyphene Hcl Hives  . Adhesive [Tape] Rash and Other (See Comments)    EKG patches  . Meperidine Hcl Rash    Social History   Socioeconomic History  . Marital status: Married    Spouse name: Not on file  . Number of children: 3  . Years of education: Not on file  . Highest education level: Not on file  Occupational History  . Occupation: retired    Comment: [employer]  Social Needs  . Financial resource strain: Not on file  . Food insecurity:    Worry: Not on file    Inability: Not on file  . Transportation needs:    Medical: Not on file    Non-medical: Not on file  Tobacco Use  . Smoking status: Never Smoker  . Smokeless tobacco: Never Used  Substance and Sexual Activity  . Alcohol use: No    Alcohol/week: 0.0 oz  . Drug use: No  . Sexual activity: Not on file  Lifestyle  . Physical activity:    Days per week: Not on file    Minutes per session: Not on file  . Stress: Not on file  Relationships  . Social connections:    Talks on phone: Not on file    Gets together: Not on file    Attends religious service: Not on file    Active member of club or organization: Not on file    Attends meetings of clubs or organizations: Not on file    Relationship status: Not on file  . Intimate partner violence:    Fear of current or ex partner: Not on file    Emotionally abused: Not on file    Physically abused: Not on file    Forced sexual activity: Not on file  Other Topics Concern  . Not on file  Social History Narrative   Married       Family History  Problem Relation Age of Onset  . Breast cancer Maternal Grandmother   . Diabetes Father   . Hyperlipidemia Father   . Hypertension Father   . Cancer Father        origin unknown  . Kidney cancer Mother   . Clotting  disorder Brother   . Cancer Sister        2015, anal ca    ROS- All systems are reviewed and negative except as per the HPI above  Physical Exam: Vitals:   02/25/18 1350  BP: 132/74  Pulse: (!) 54  Weight: 168 lb (76.2 kg)  Height: 5\' 6"  (1.676 m)  Wt Readings from Last 3 Encounters:  02/25/18 168 lb (76.2 kg)  02/14/18 172 lb 12.8 oz (78.4 kg)  02/03/18 171 lb 8 oz (77.8 kg)    Labs: Lab Results  Component Value Date   NA 142 02/03/2018   K 3.5 02/03/2018   CL 104 02/03/2018   CO2 22 02/03/2018   GLUCOSE 74 02/03/2018   BUN 22 02/03/2018   CREATININE 1.42 (H) 02/03/2018   CALCIUM 9.3 02/03/2018   Lab Results  Component Value Date   INR 1.3 (H) 02/03/2018   Lab Results  Component Value Date   CHOL 165 08/09/2017   HDL 75.40 08/09/2017   LDLCALC 71 08/09/2017   TRIG 93.0 08/09/2017     GEN- The patient is well appearing, alert and oriented x 3 today.   Head- normocephalic, atraumatic Eyes-  Sclera clear, conjunctiva pink Ears- hearing intact Oropharynx- clear Neck- supple, no JVP Lymph- no cervical lymphadenopathy Lungs- Clear to ausculation bilaterally, normal work of breathing Heart- regular rate and rhythm, no murmurs, rubs or gallops, PMI not laterally displaced GI- soft, NT, ND, + BS Extremities- no clubbing, cyanosis, or edema MS- no significant deformity or atrophy Skin- no rash or lesion Psych- euthymic mood, full affect Neuro- strength and sensation are intact  EKG-  Sinus brady at 54 bpm, pr int 202 ms, qrs int 100 ms, qtc 457 ms Epic records reviewed Echo-Study Conclusions  - Left ventricle: The cavity size was normal. There was mild focal   basal hypertrophy of the septum. Systolic function was normal.   The estimated ejection fraction was in the range of 55% to 60%.   The study was not technically sufficient to allow evaluation of   LV diastolic dysfunction due to atrial fibrillation. - Pulmonary arteries: PA peak pressure: 33 mm Hg  (S).    Assessment and Plan: 1.Persistent afib  Loaded on flecainide 50 mg bid and had successful cardioversion 02/11/18 Maintaining SR For now stay on current BB dose, but consider lowering dose to 100 mg daily(dropping pm dose of 50 mg ) with f/u with Dr. Johnsie Cancel 5/29 Monitor HR at home and id becomes symptomatic with HR in the 50's, or drops into the 40's will reduce dose of BB  2. Chadsvasc score of at least 4 Had allergic reaction to eliquis Now on xarelto 15 mg for crcl cal at 49.93 Reminded not to miss any doses of xarelto  F/u Dr. Johnsie Cancel 5/29  Geroge Baseman. Jolyne Laye, [facility] [address] [place], [place] [address] [phone]

## 2018-04-01 DIAGNOSIS — D1801 Hemangioma of skin and subcutaneous tissue: Secondary | ICD-10-CM | POA: Diagnosis not present

## 2018-04-01 DIAGNOSIS — D692 Other nonthrombocytopenic purpura: Secondary | ICD-10-CM | POA: Diagnosis not present

## 2018-04-01 DIAGNOSIS — L812 Freckles: Secondary | ICD-10-CM | POA: Diagnosis not present

## 2018-04-01 DIAGNOSIS — Z85828 Personal history of other malignant neoplasm of skin: Secondary | ICD-10-CM | POA: Diagnosis not present

## 2018-04-01 DIAGNOSIS — L821 Other seborrheic keratosis: Secondary | ICD-10-CM | POA: Diagnosis not present

## 2018-04-01 DIAGNOSIS — L820 Inflamed seborrheic keratosis: Secondary | ICD-10-CM | POA: Diagnosis not present

## 2018-04-01 NOTE — Progress Notes (Signed)
77 y.o. wife of one of my patients. Previously seen by Dr Percival Spanish. . Noted to be in afib by PCP 12/2017  Rx with Eliquis and beta blocker ? Allergic reaction to Eliquis on Xarelto now  12/31/17 I performed her [facility] and she failed to convert with 4 defibrillations Started on flecainide ETT done 01/31/18 reviewed and normal with no pro arrhythmia or QRS widening. Echo reviewed 12/17/17 EF 55-60% otherwise normal. She has a diagnosis of CRF but last GFR was 53 with Cr 1.16 12/24/17  Repeat [place] on flecainide successful 02/11/18 Was in NSR f/u afib clinic on 02/25/18  She has had 5 episodes of ? PAF since [facility] where HR goes up to 90 range Episodes can last Hours    Past Medical History:  Diagnosis Date  . Adenomatous colon polyp   . Anal fissure   . Asthma    Dr. Halford Chessman  . Bowel obstruction ([place])   . Bursitis   . CKD (chronic kidney disease) stage 3, GFR 30-59 ml/min ([facility])    sees Dr. [name]   . Complication of anesthesia    post op N/V  . Diverticulosis   . Glaucoma   . Hiatal hernia    sees Dr. [name] at [facility]   . Hyperlipidemia   . Hypertension   . Hypothyroidism   . Myasthenia gravis ([place]) 2013   followed at [facility] Dr Scheryl Marten, diplopia only  . Osteoarthritis    see's Dr. [name]  . Osteoporosis    last DEXA 08-03-11   Past Surgical History:  Procedure Laterality Date  . ABDOMINAL HYSTERECTOMY    . APPENDECTOMY    . BREAST LUMPECTOMY Bilateral     several, enign breast lumps removed  . CARDIOVERSION N/A 12/31/2017   Procedure: CARDIOVERSION;  Surgeon: Josue Hector, MD;  Location: [facility] ENDOSCOPY;  Service: Cardiovascular;  Laterality: N/A;  . CARDIOVERSION N/A 02/11/2018   Procedure: CARDIOVERSION;  Surgeon: Josue Hector, MD;  Location: [facility] ENDOSCOPY;  Service: Cardiovascular;  Laterality: N/A;  . HERNIA REPAIR  10-18-10   Dr. Hassell Done attempted but could reduce this, the entire stomach is above the diaphragm  . THYROIDECTOMY, PARTIAL  1999  . TONSILLECTOMY       Current Outpatient Medications  Medication Sig Dispense Refill  . albuterol (PROVENTIL HFA;VENTOLIN HFA) 108 (90 Base) MCG/ACT inhaler Inhale 2 puffs into the lungs every 6 (six) hours as needed for wheezing or shortness of breath.    Marland Kitchen atorvastatin (LIPITOR) 10 MG tablet Take 1 tablet (10 mg total) by mouth daily. 90 tablet 3  . flecainide (TAMBOCOR) 50 MG tablet Take 1 tablet (50 mg total) by mouth 2 (two) times daily. 60 tablet 11  . levothyroxine (SYNTHROID, LEVOTHROID) 100 MCG tablet Take 1 tablet (100 mcg total) by mouth daily. 90 tablet 3  . Melatonin 5 MG CAPS Take 5 mg by mouth at bedtime as needed (for sleep).    . metoprolol succinate (TOPROL-XL) 100 MG 24 hr tablet Take 50-100 mg by mouth See admin instructions. Take 100 mg by mouth in the morning and take 50 mg by mouth in the evening. Take with or immediately following a meal.    . multivitamin-iron-minerals-folic acid (CENTRUM) chewable tablet Chew 1 tablet by mouth daily.    . predniSONE (DELTASONE) 1 MG tablet Take 4 mg by mouth daily.    . psyllium (METAMUCIL) 58.6 % powder Take 1 packet by mouth daily as needed (for constipation).     Marland Kitchen  Rivaroxaban (XARELTO) 15 MG TABS tablet Take 1 tablet (15 mg total) by mouth daily with supper. 90 tablet 3  . Travoprost, BAK Free, (TRAVATAN) 0.004 % SOLN ophthalmic solution Place 1 drop into both eyes at bedtime.    . triamterene-hydrochlorothiazide (DYAZIDE) 37.5-25 MG capsule Take 1 each (1 capsule total) by mouth daily. 90 capsule 3   No current facility-administered medications for this visit.     Allergies  Allergen Reactions  . Codeine Hives  . Eliquis [Apixaban] Hives  . Propoxyphene Hcl Hives  . Adhesive [Tape] Rash and Other (See Comments)    EKG patches  . Meperidine Hcl Rash    Social History   Socioeconomic History  . Marital status: Married    Spouse name: Not on file  . Number of children: 3  . Years of education: Not on file  . Highest education level:  Not on file  Occupational History  . Occupation: retired    Comment: [employer]  Social Needs  . Financial resource strain: Not on file  . Food insecurity:    Worry: Not on file    Inability: Not on file  . Transportation needs:    Medical: Not on file    Non-medical: Not on file  Tobacco Use  . Smoking status: Never Smoker  . Smokeless tobacco: Never Used  Substance and Sexual Activity  . Alcohol use: No    Alcohol/week: 0.0 oz  . Drug use: No  . Sexual activity: Not on file  Lifestyle  . Physical activity:    Days per week: Not on file    Minutes per session: Not on file  . Stress: Not on file  Relationships  . Social connections:    Talks on phone: Not on file    Gets together: Not on file    Attends religious service: Not on file    Active member of club or organization: Not on file    Attends meetings of clubs or organizations: Not on file    Relationship status: Not on file  . Intimate partner violence:    Fear of current or ex partner: Not on file    Emotionally abused: Not on file    Physically abused: Not on file    Forced sexual activity: Not on file  Other Topics Concern  . Not on file  Social History Narrative   Married       Family History  Problem Relation Age of Onset  . Breast cancer Maternal Grandmother   . Diabetes Father   . Hyperlipidemia Father   . Hypertension Father   . Cancer Father        origin unknown  . Kidney cancer Mother   . Clotting disorder Brother   . Cancer Sister        2015, anal ca    ROS- All systems are reviewed and negative except as per the HPI above  Physical Exam: Vitals:   04/02/18 1425  BP: 134/84  Pulse: 76  SpO2: 95%  Weight: 166 lb (75.3 kg)  Height: 5\' 6"  (1.676 m)   Wt Readings from Last 3 Encounters:  04/02/18 166 lb (75.3 kg)  02/25/18 168 lb (76.2 kg)  02/14/18 172 lb 12.8 oz (78.4 kg)    Labs: Lab Results  Component Value Date   NA 142 02/03/2018   K 3.5 02/03/2018   CL 104  02/03/2018   CO2 22 02/03/2018   GLUCOSE 74 02/03/2018   BUN 22 02/03/2018  CREATININE 1.42 (H) 02/03/2018   CALCIUM 9.3 02/03/2018   Lab Results  Component Value Date   INR 1.3 (H) 02/03/2018   Lab Results  Component Value Date   CHOL 165 08/09/2017   HDL 75.40 08/09/2017   LDLCALC 71 08/09/2017   TRIG 93.0 08/09/2017     BP 134/84   Pulse 76   Ht 5\' 6"  (1.676 m)   Wt 166 lb (75.3 kg)   SpO2 95%   BMI 26.79 kg/m  Affect appropriate Healthy:  appears stated age 47: normal Neck supple with no adenopathy JVP normal no bruits no thyromegaly Lungs clear with no wheezing and good diaphragmatic motion Heart:  S1/S2 no murmur, no rub, gallop or click PMI normal Abdomen: benighn, BS positve, no tenderness, no AAA no bruit.  No HSM or HJR Distal pulses intact with no bruits No edema Neuro non-focal Skin warm and dry No muscular weakness   Echo-Study Conclusions  - Left ventricle: The cavity size was normal. There was mild focal   basal hypertrophy of the septum. Systolic function was normal.   The estimated ejection fraction was in the range of 55% to 60%.   The study was not technically sufficient to allow evaluation of   LV diastolic dysfunction due to atrial fibrillation. - Pulmonary arteries: PA peak pressure: 33 mm Hg (S).    Assessment and Plan: 1 PAF: post successful [place] on flecainide 02/11/18 anticoagulation with xarelto stable will lower Beta blocker dose now that she is converted She is having short bouts of ? PAF will give  Event monitor and if still having bouts increase flecainide to 75 bid   2. Chadsvasc score of at least 4  She has had CrCl less than 50 so has been placed on 15 mg dose of Xarelto allergic to eliquis   3. CRF:  Stable avoid over diuresis CR 1.42 02/03/18 GFR 41    Jenkins Rouge

## 2018-04-02 ENCOUNTER — Ambulatory Visit: Payer: Medicare HMO | Admitting: Cardiovascular Disease

## 2018-04-02 ENCOUNTER — Encounter: Payer: Self-pay | Admitting: Cardiovascular Disease

## 2018-04-02 VITALS — BP 134/84 | HR 76 | Ht 66.0 in | Wt 166.0 lb

## 2018-04-02 DIAGNOSIS — I4891 Unspecified atrial fibrillation: Secondary | ICD-10-CM

## 2018-04-02 MED ORDER — FLECAINIDE ACETATE 50 MG PO TABS: 50.0000 mg | ORAL_TABLET | Freq: Two times a day (BID) | ORAL | 11 refills | Status: DC

## 2018-04-02 NOTE — Patient Instructions (Addendum)
Medication Instructions:  Your physician recommends that you continue on your current medications as directed. Please refer to the Current Medication list given to you today.  Labwork: NONE  Testing/Procedures: Your physician has recommended that you wear an event monitor. Event monitors are medical devices that record the heart's electrical activity. Doctors most often [place] these monitors to diagnose arrhythmias. Arrhythmias are problems with the speed or rhythm of the heartbeat. The monitor is a small, portable device. You can wear one while you do your normal daily activities. This is usually used to diagnose what is causing palpitations/syncope (passing out).  Follow-Up: Your physician wants you to follow-up in: 3 months with Dr. Johnsie Cancel.    If you need a refill on your cardiac medications before your next appointment, please call your pharmacy.

## 2018-04-03 NOTE — Addendum Note (Signed)
Addended by: Joaquim Lai on: 04/03/2018 03:42 PM   Modules accepted: Orders

## 2018-04-11 DIAGNOSIS — H401132 Primary open-angle glaucoma, bilateral, moderate stage: Secondary | ICD-10-CM | POA: Diagnosis not present

## 2018-04-28 ENCOUNTER — Ambulatory Visit (INDEPENDENT_AMBULATORY_CARE_PROVIDER_SITE_OTHER): Payer: Medicare HMO

## 2018-04-28 DIAGNOSIS — I4891 Unspecified atrial fibrillation: Secondary | ICD-10-CM | POA: Diagnosis not present

## 2018-05-02 ENCOUNTER — Telehealth: Payer: Self-pay | Admitting: Cardiovascular Disease

## 2018-05-02 NOTE — Telephone Encounter (Signed)
New Message   RJ with [employer] is calling to report a abnormal EKG

## 2018-05-02 NOTE — Telephone Encounter (Signed)
RJ with [employer] is calling to inform Dr Johnsie Cancel and RN that the pts event monitor auto-riggered for first documented atrial fibrillation at a rate of 70-80 bpm.  Per RJ, they tried calling the pt and she did not answer.  Pt is on Xarelto and flecainide. Pt has a history of persistent afib.  Per RJ, he is faxing this report over to our office at [phone]. Will forward this message to Dr Kyla Balzarine RN for further follow-up.

## 2018-05-02 NOTE — Telephone Encounter (Signed)
Called patient to see how she was feeling at the time of event. Patient stated she was sleeping, because she had a hard time sleeping last night. Patient stated she is doing fine now. Monitor showed atrial fibrillation, sinus bradycardia. Had acting DOD, Dr. Lovena Le, review patient's episode. No changes at this time, that her HR was low and she should go back into NSR. Also recommend patient to call with any issues if she goes back into atrial fib. Advised patient to call our office with any concerns. Patient verbalized understanding. Will forward to Dr. Johnsie Cancel, so he is aware.

## 2018-07-07 NOTE — Progress Notes (Signed)
77 y.o. wife of one of my patients. Previously seen by Dr Percival Spanish. . Noted to be in afib by PCP 12/2017  Rx with Eliquis and beta blocker ? Allergic reaction to Eliquis  Now on Xarelto low dose due to CKD   12/31/17 I performed her [facility] and she failed to convert with 4 defibrillations Started on flecainide ETT done 01/31/18 reviewed and normal with no pro arrhythmia or QRS widening. Echo reviewed 12/17/17 EF 55-60% otherwise normal.     Repeat [place] on flecainide successful 02/11/18 Was in NSR f/u afib clinic on 02/25/18  Monitor 04/28/18 average HR 56 bpm NSR 99% of time   Wondering if her thyroid is normal no recent labs  Compliant with meds and takes an extra flecainide when she has PAF   Past Medical History:  Diagnosis Date  . Adenomatous colon polyp   . Anal fissure   . Asthma    Dr. Halford Chessman  . Bowel obstruction ([place])   . Bursitis   . CKD (chronic kidney disease) stage 3, GFR 30-59 ml/min (HCC)    sees Dr. [name]   . Complication of anesthesia    post op N/V  . Diverticulosis   . Glaucoma   . Hiatal hernia    sees Dr. [name] at [facility]   . Hyperlipidemia   . Hypertension   . Hypothyroidism   . Myasthenia gravis ([place]) 2013   followed at [facility] Dr Scheryl Marten, diplopia only  . Osteoarthritis    see's Dr. [name]  . Osteoporosis    last DEXA 08-03-11   Past Surgical History:  Procedure Laterality Date  . ABDOMINAL HYSTERECTOMY    . APPENDECTOMY    . BREAST LUMPECTOMY Bilateral     several, enign breast lumps removed  . CARDIOVERSION N/A 12/31/2017   Procedure: CARDIOVERSION;  Surgeon: Josue Hector, MD;  Location: [facility] ENDOSCOPY;  Service: Cardiovascular;  Laterality: N/A;  . CARDIOVERSION N/A 02/11/2018   Procedure: CARDIOVERSION;  Surgeon: Josue Hector, MD;  Location: [facility] ENDOSCOPY;  Service: Cardiovascular;  Laterality: N/A;  . HERNIA REPAIR  10-18-10   Dr. Hassell Done attempted but could reduce this, the entire stomach is above the diaphragm  . THYROIDECTOMY,  PARTIAL  1999  . TONSILLECTOMY      Current Outpatient Medications  Medication Sig Dispense Refill  . albuterol (PROVENTIL HFA;VENTOLIN HFA) 108 (90 Base) MCG/ACT inhaler Inhale 2 puffs into the lungs every 6 (six) hours as needed for wheezing or shortness of breath.    Marland Kitchen atorvastatin (LIPITOR) 10 MG tablet Take 1 tablet (10 mg total) by mouth daily. 90 tablet 3  . flecainide (TAMBOCOR) 50 MG tablet Take 1 tablet (50 mg total) by mouth 2 (two) times daily. 60 tablet 11  . levothyroxine (SYNTHROID, LEVOTHROID) 100 MCG tablet Take 1 tablet (100 mcg total) by mouth daily. 90 tablet 3  . Melatonin 5 MG CAPS Take 5 mg by mouth at bedtime as needed (for sleep).    . metoprolol succinate (TOPROL-XL) 100 MG 24 hr tablet Take 50-100 mg by mouth See admin instructions. Take 100 mg by mouth in the morning and take 50 mg by mouth in the evening. Take with or immediately following a meal.    . multivitamin-iron-minerals-folic acid (CENTRUM) chewable tablet Chew 1 tablet by mouth daily.    . predniSONE (DELTASONE) 1 MG tablet Take 4 mg by mouth daily.    . psyllium (METAMUCIL) 58.6 % powder Take 1 packet by mouth daily as  needed (for constipation).     . Rivaroxaban (XARELTO) 15 MG TABS tablet Take 1 tablet (15 mg total) by mouth daily with supper. 90 tablet 3  . Travoprost, BAK Free, (TRAVATAN) 0.004 % SOLN ophthalmic solution Place 1 drop into both eyes at bedtime.    . triamterene-hydrochlorothiazide (DYAZIDE) 37.5-25 MG capsule Take 1 each (1 capsule total) by mouth daily. 90 capsule 3   No current facility-administered medications for this visit.     Allergies  Allergen Reactions  . Codeine Hives  . Eliquis [Apixaban] Hives  . Propoxyphene Hcl Hives  . Adhesive [Tape] Rash and Other (See Comments)    EKG patches  . Meperidine Hcl Rash    Social History   Socioeconomic History  . Marital status: Married    Spouse name: Not on file  . Number of children: 3  . Years of education: Not on  file  . Highest education level: Not on file  Occupational History  . Occupation: retired    Comment: [employer]  Social Needs  . Financial resource strain: Not on file  . Food insecurity:    Worry: Not on file    Inability: Not on file  . Transportation needs:    Medical: Not on file    Non-medical: Not on file  Tobacco Use  . Smoking status: Never Smoker  . Smokeless tobacco: Never Used  Substance and Sexual Activity  . Alcohol use: No    Alcohol/week: 0.0 standard drinks  . Drug use: No  . Sexual activity: Not on file  Lifestyle  . Physical activity:    Days per week: Not on file    Minutes per session: Not on file  . Stress: Not on file  Relationships  . Social connections:    Talks on phone: Not on file    Gets together: Not on file    Attends religious service: Not on file    Active member of club or organization: Not on file    Attends meetings of clubs or organizations: Not on file    Relationship status: Not on file  . Intimate partner violence:    Fear of current or ex partner: Not on file    Emotionally abused: Not on file    Physically abused: Not on file    Forced sexual activity: Not on file  Other Topics Concern  . Not on file  Social History Narrative   Married       Family History  Problem Relation Age of Onset  . Breast cancer Maternal Grandmother   . Diabetes Father   . Hyperlipidemia Father   . Hypertension Father   . Cancer Father        origin unknown  . Kidney cancer Mother   . Clotting disorder Brother   . Cancer Sister        2015, anal ca    ROS- All systems are reviewed and negative except as per the HPI above  Physical Exam: Vitals:   07/10/18 1358  BP: 116/70  Pulse: 64  SpO2: 97%  Weight: 167 lb (75.8 kg)  Height: 5\' 6"  (1.676 m)   Wt Readings from Last 3 Encounters:  07/10/18 167 lb (75.8 kg)  04/02/18 166 lb (75.3 kg)  02/25/18 168 lb (76.2 kg)    Labs: Lab Results  Component Value Date   NA 142 02/03/2018     K 3.5 02/03/2018   CL 104 02/03/2018   CO2 22 02/03/2018   GLUCOSE  74 02/03/2018   BUN 22 02/03/2018   CREATININE 1.42 (H) 02/03/2018   CALCIUM 9.3 02/03/2018   Lab Results  Component Value Date   INR 1.3 (H) 02/03/2018   Lab Results  Component Value Date   CHOL 165 08/09/2017   HDL 75.40 08/09/2017   LDLCALC 71 08/09/2017   TRIG 93.0 08/09/2017     BP 116/70   Pulse 64   Ht 5\' 6"  (1.676 m)   Wt 167 lb (75.8 kg)   SpO2 97%   BMI 26.95 kg/m  Affect appropriate Healthy:  appears stated age 58: normal Neck supple with no adenopathy JVP normal no bruits no thyromegaly Lungs clear with no wheezing and good diaphragmatic motion Heart:  S1/S2 no murmur, no rub, gallop or click PMI normal Abdomen: benighn, BS positve, no tenderness, no AAA no bruit.  No HSM or HJR Distal pulses intact with no bruits No edema Neuro non-focal Skin warm and dry No muscular weakness    Echo-Study Conclusions  - Left ventricle: The cavity size was normal. There was mild focal   basal hypertrophy of the septum. Systolic function was normal.   The estimated ejection fraction was in the range of 55% to 60%.   The study was not technically sufficient to allow evaluation of   LV diastolic dysfunction due to atrial fibrillation. - Pulmonary arteries: PA peak pressure: 33 mm Hg (S).    Assessment and Plan: 1 PAF: post successful [place] on flecainide 02/11/18 anticoagulation with xarelto stable will lower  Beta blocker dose now that she is converted  Monitor 04/28/18 with average HR 56 bpm and NSr 99% of time on higher dose of flecainide 75 mg bid   2. Chadsvasc score of at least 4  She has had CrCl less than 50 so has been placed on 15 mg dose of Xarelto allergic to eliquis   3. CRF:  Stable avoid over diuresis CR 1.42 02/03/18 GFR 41   4. Thyroid:  Will order TSH/T4 today    Jenkins Rouge

## 2018-07-10 ENCOUNTER — Ambulatory Visit: Payer: Medicare HMO | Admitting: Cardiovascular Disease

## 2018-07-10 ENCOUNTER — Encounter: Payer: Self-pay | Admitting: Cardiovascular Disease

## 2018-07-10 VITALS — BP 116/70 | HR 64 | Ht 66.0 in | Wt 167.0 lb

## 2018-07-10 DIAGNOSIS — I481 Persistent atrial fibrillation: Secondary | ICD-10-CM

## 2018-07-10 DIAGNOSIS — I4819 Other persistent atrial fibrillation: Secondary | ICD-10-CM

## 2018-07-10 NOTE — Patient Instructions (Addendum)
Medication Instructions:  Your physician recommends that you continue on your current medications as directed. Please refer to the Current Medication list given to you today.  Labwork: Your physician recommends that you have lab work today TSH or T 4   Testing/Procedures: NONE  Follow-Up: Your physician wants you to follow-up in: 6 months with Dr. Johnsie Cancel. You will receive a reminder letter in the mail two months in advance. If you don't receive a letter, please call our office to schedule the follow-up appointment.   If you need a refill on your cardiac medications before your next appointment, please call your pharmacy.

## 2018-07-11 LAB — TSH: TSH: 0.515 u[IU]/mL (ref 0.450–4.500)

## 2018-07-11 LAB — T4, FREE: Free T4: 1.82 ng/dL — ABNORMAL HIGH (ref 0.82–1.77)

## 2018-08-04 DIAGNOSIS — Z6829 Body mass index (BMI) 29.0-29.9, adult: Secondary | ICD-10-CM | POA: Diagnosis not present

## 2018-08-04 DIAGNOSIS — N183 Chronic kidney disease, stage 3 (moderate): Secondary | ICD-10-CM | POA: Diagnosis not present

## 2018-08-04 DIAGNOSIS — E039 Hypothyroidism, unspecified: Secondary | ICD-10-CM | POA: Diagnosis not present

## 2018-08-04 DIAGNOSIS — N390 Urinary tract infection, site not specified: Secondary | ICD-10-CM | POA: Diagnosis not present

## 2018-08-04 DIAGNOSIS — I10 Essential (primary) hypertension: Secondary | ICD-10-CM | POA: Diagnosis not present

## 2018-08-11 ENCOUNTER — Encounter: Payer: Self-pay | Admitting: Family Medicine

## 2018-08-11 ENCOUNTER — Ambulatory Visit (INDEPENDENT_AMBULATORY_CARE_PROVIDER_SITE_OTHER): Payer: Medicare HMO | Admitting: Family Medicine

## 2018-08-11 VITALS — BP 138/88 | HR 51 | Temp 98.0°F | Ht 65.0 in | Wt 169.4 lb

## 2018-08-11 DIAGNOSIS — N183 Chronic kidney disease, stage 3 unspecified: Secondary | ICD-10-CM

## 2018-08-11 DIAGNOSIS — R945 Abnormal results of liver function studies: Secondary | ICD-10-CM

## 2018-08-11 DIAGNOSIS — E785 Hyperlipidemia, unspecified: Secondary | ICD-10-CM | POA: Diagnosis not present

## 2018-08-11 DIAGNOSIS — R109 Unspecified abdominal pain: Secondary | ICD-10-CM | POA: Diagnosis not present

## 2018-08-11 DIAGNOSIS — J4530 Mild persistent asthma, uncomplicated: Secondary | ICD-10-CM

## 2018-08-11 DIAGNOSIS — J984 Other disorders of lung: Secondary | ICD-10-CM

## 2018-08-11 DIAGNOSIS — I10 Essential (primary) hypertension: Secondary | ICD-10-CM

## 2018-08-11 DIAGNOSIS — E039 Hypothyroidism, unspecified: Secondary | ICD-10-CM | POA: Diagnosis not present

## 2018-08-11 LAB — CBC WITH DIFFERENTIAL/PLATELET
BASOS ABS: 0 10*3/uL (ref 0.0–0.1)
BASOS PCT: 0.5 % (ref 0.0–3.0)
EOS ABS: 0.1 10*3/uL (ref 0.0–0.7)
EOS PCT: 0.7 % (ref 0.0–5.0)
HEMATOCRIT: 45.8 % (ref 36.0–46.0)
HEMOGLOBIN: 15.3 g/dL — AB (ref 12.0–15.0)
LYMPHS ABS: 1.8 10*3/uL (ref 0.7–4.0)
Lymphocytes Relative: 17.2 % (ref 12.0–46.0)
MCHC: 33.5 g/dL (ref 30.0–36.0)
MCV: 92.8 fl (ref 78.0–100.0)
MONO ABS: 0.6 10*3/uL (ref 0.1–1.0)
Monocytes Relative: 5.5 % (ref 3.0–12.0)
NEUTROS ABS: 8 10*3/uL — AB (ref 1.4–7.7)
Neutrophils Relative %: 76.1 % (ref 43.0–77.0)
Platelets: 184 10*3/uL (ref 150.0–400.0)
RBC: 4.93 Mil/uL (ref 3.87–5.11)
RDW: 13.9 % (ref 11.5–15.5)
WBC: 10.5 10*3/uL (ref 4.0–10.5)

## 2018-08-11 LAB — BASIC METABOLIC PANEL
BUN: 23 mg/dL (ref 6–23)
CALCIUM: 9.9 mg/dL (ref 8.4–10.5)
CO2: 31 meq/L (ref 19–32)
Chloride: 103 mEq/L (ref 96–112)
Creatinine, Ser: 1.24 mg/dL — ABNORMAL HIGH (ref 0.40–1.20)
GFR: 44.54 mL/min — ABNORMAL LOW (ref >60.00)
GLUCOSE: 99 mg/dL (ref 70–99)
Potassium: 4.3 mEq/L (ref 3.5–5.1)
Sodium: 141 mEq/L (ref 135–145)

## 2018-08-11 LAB — LIPID PANEL
Cholesterol: 159 mg/dL (ref 0–200)
HDL: 71.3 mg/dL (ref >39.00)
LDL Cholesterol: 66 mg/dL (ref 0–99)
NonHDL: 87.44
TRIGLYCERIDES: 108 mg/dL (ref 0.0–149.0)
Total CHOL/HDL Ratio: 2
VLDL: 21.6 mg/dL (ref 0.0–40.0)

## 2018-08-11 LAB — HEPATIC FUNCTION PANEL
ALT: 54 U/L — AB (ref 0–35)
AST: 38 U/L — ABNORMAL HIGH (ref 0–37)
Albumin: 4.2 g/dL (ref 3.5–5.2)
Alkaline Phosphatase: 57 U/L (ref 39–117)
BILIRUBIN DIRECT: 0.2 mg/dL (ref 0.0–0.3)
TOTAL PROTEIN: 6.7 g/dL (ref 6.0–8.3)
Total Bilirubin: 1.1 mg/dL (ref 0.2–1.2)

## 2018-08-11 LAB — POC URINALSYSI DIPSTICK (AUTOMATED)
BILIRUBIN UA: NEGATIVE
Blood, UA: NEGATIVE
GLUCOSE UA: NEGATIVE
KETONES UA: NEGATIVE
PROTEIN UA: NEGATIVE
SPEC GRAV UA: 1.015 (ref 1.010–1.025)
Urobilinogen, UA: 0.2 E.U./dL
pH, UA: 7 (ref 5.0–8.0)

## 2018-08-11 LAB — LIPASE: Lipase: 68 U/L — ABNORMAL HIGH (ref 11.0–59.0)

## 2018-08-11 LAB — T4, FREE: Free T4: 1.31 ng/dL (ref 0.60–1.60)

## 2018-08-11 LAB — AMYLASE: Amylase: 45 U/L (ref 27–131)

## 2018-08-11 LAB — T3, FREE: T3, Free: 3.2 pg/mL (ref 2.3–4.2)

## 2018-08-11 LAB — TSH: TSH: 0.76 u[IU]/mL (ref 0.35–4.50)

## 2018-08-11 MED ORDER — TRIAMTERENE-HCTZ 37.5-25 MG PO CAPS: 1.0000 | ORAL_CAPSULE | Freq: Every day | ORAL | 3 refills | Status: DC

## 2018-08-11 MED ORDER — ATORVASTATIN CALCIUM 10 MG PO TABS: 10.0000 mg | ORAL_TABLET | Freq: Every day | ORAL | 3 refills | Status: DC

## 2018-08-11 MED ORDER — LEVOTHYROXINE SODIUM 100 MCG PO TABS: 100.0000 ug | ORAL_TABLET | Freq: Every day | ORAL | 3 refills | Status: DC

## 2018-08-11 MED ORDER — METOPROLOL SUCCINATE ER 100 MG PO TB24: ORAL_TABLET | ORAL | 3 refills | Status: DC

## 2018-08-11 NOTE — Progress Notes (Signed)
   Subjective:    Patient ID: Christina Jacobs, female    DOB: 03-Jan-1941, 77 y.o.   MRN: [id number]  HPI Here to follow up on issues and with some concerns. She saw Dr. Johnsie Cancel last month and she was in sinus rhythm. She feels a short run of atrial fibrillation at times but this is rare. He  Did increase the dose of her Metoprolol slightly. Her BP is stable. She has questions about pancreatic cancer. She notes that within 16 households in her neighborhood 4 women have died of pancreatic cancer in the past year. She wonders about her water sources (she gets public water). She mentions a vague upper abdominal pain that she feels from time to time but this is mild and of short duration. No trouble with urinations or bowels.    Review of Systems  Constitutional: Negative.   HENT: Negative.   Eyes: Negative.   Respiratory: Negative.   Cardiovascular: Negative.   Gastrointestinal: Positive for abdominal pain. Negative for abdominal distention, anal bleeding, blood in stool, constipation, diarrhea, nausea, rectal pain and vomiting.  Genitourinary: Negative for decreased urine volume, difficulty urinating, dyspareunia, dysuria, enuresis, flank pain, frequency, hematuria, pelvic pain and urgency.  Musculoskeletal: Negative.   Skin: Negative.   Neurological: Negative.   Psychiatric/Behavioral: Negative.        Objective:   Physical Exam  Constitutional: She is oriented to person, place, and time. She appears well-developed and well-nourished. No distress.  HENT:  Head: Normocephalic and atraumatic.  Right Ear: External ear normal.  Left Ear: External ear normal.  Nose: Nose normal.  Mouth/Throat: Oropharynx is clear and moist. No oropharyngeal exudate.  Eyes: Pupils are equal, round, and reactive to light. Conjunctivae and EOM are normal. No scleral icterus.  Neck: Normal range of motion. Neck supple. No JVD present. No thyromegaly present.  Cardiovascular: Normal rate, regular rhythm, normal  heart sounds and intact distal pulses. Exam reveals no gallop and no friction rub.  No murmur heard. Pulmonary/Chest: Effort normal and breath sounds normal. No respiratory distress. She has no wheezes. She has no rales. She exhibits no tenderness.  Abdominal: Soft. Bowel sounds are normal. She exhibits no distension and no mass. There is no tenderness. There is no rebound and no guarding.  Musculoskeletal: Normal range of motion. She exhibits no edema or tenderness.  Lymphadenopathy:    She has no cervical adenopathy.  Neurological: She is alert and oriented to person, place, and time. She has normal reflexes. She displays normal reflexes. No cranial nerve deficit. She exhibits normal muscle tone. Coordination normal.  Skin: Skin is warm and dry. No rash noted. No erythema.  Psychiatric: She has a normal mood and affect. Her behavior is normal. Judgment and thought content normal.          Assessment & Plan:  Her paroxysmal atrial fibrillation is well controlled. She remains on Xarelto. Her HTN is stable. We will get fasting labs today to check lipids and her thyroid levels. Check pancreatic enzymes. She saw Dr. Moshe Cipro a few weeks ago and wants to see what her renal function looks like.  Alysia Penna, MD

## 2018-08-13 NOTE — Addendum Note (Signed)
Addended by: Alysia Penna A on: 08/13/2018 01:17 PM   Modules accepted: Orders

## 2018-08-22 ENCOUNTER — Telehealth: Payer: Self-pay | Admitting: *Deleted

## 2018-08-22 NOTE — Telephone Encounter (Signed)
Copied from [place] [id number]. Topic: General - Other >> Aug 22, 2018  3:12 PM Bea Graff, NT wrote: Reason for CRM: Antonio with [employer] calling and states that before the CT of abdomen can be approved they need to see results of a previous ultrasound. CB#: [phone] Ref#: [id number]. Fax#: [phone]

## 2018-08-26 ENCOUNTER — Inpatient Hospital Stay: Admission: RE | Admit: 2018-08-26 | Payer: Medicare HMO | Source: Ambulatory Visit

## 2018-08-27 ENCOUNTER — Inpatient Hospital Stay: Admission: RE | Admit: 2018-08-27 | Payer: Medicare HMO | Source: Ambulatory Visit

## 2018-08-27 NOTE — Telephone Encounter (Signed)
Norina Buzzard with [employer] called to give denial of CT abdomen/pelvis due to clinical information received. Appeal - [phone], ref#: [id number]

## 2018-08-27 NOTE — Addendum Note (Signed)
Addended by: Alysia Penna A on: 08/27/2018 08:08 AM   Modules accepted: Orders

## 2018-08-28 NOTE — Telephone Encounter (Signed)
Christina Jacobs with [employer] called to give denial of CT abdomen/pelvis due to clinical information received. Appeal - [phone], ref#: [id number]

## 2018-08-29 NOTE — Telephone Encounter (Signed)
Not sure  How to help this situation  Did she get her ultrasound already? I see an order but nothing else  Dr Sarajane Jews should be back next week t o hopefully clarify this situation

## 2018-09-01 ENCOUNTER — Ambulatory Visit
Admission: RE | Admit: 2018-09-01 | Discharge: 2018-09-01 | Disposition: A | Discharge status: Home / Self Care | Payer: Medicare HMO | Source: Ambulatory Visit | Attending: Family Medicine | Admitting: Family Medicine

## 2018-09-01 DIAGNOSIS — R109 Unspecified abdominal pain: Secondary | ICD-10-CM | POA: Diagnosis not present

## 2018-09-04 ENCOUNTER — Telehealth: Payer: Self-pay

## 2018-09-04 DIAGNOSIS — R109 Unspecified abdominal pain: Secondary | ICD-10-CM

## 2018-09-04 NOTE — Telephone Encounter (Signed)
Stacy @ [facility] called to ask if new order for Abd/Pelvic CT could be put it now that pt has had [place] and it was neg.   Dr. Sarajane Jews - Please advise. Pt would like this done ASAP. Thanks!

## 2018-09-05 ENCOUNTER — Other Ambulatory Visit: Payer: Medicare HMO

## 2018-09-05 NOTE — Telephone Encounter (Signed)
The order was sent in for the CT

## 2018-09-16 DIAGNOSIS — H43813 Vitreous degeneration, bilateral: Secondary | ICD-10-CM | POA: Diagnosis not present

## 2018-09-16 DIAGNOSIS — H02052 Trichiasis without entropian right lower eyelid: Secondary | ICD-10-CM | POA: Diagnosis not present

## 2018-09-16 DIAGNOSIS — H524 Presbyopia: Secondary | ICD-10-CM | POA: Diagnosis not present

## 2018-09-16 DIAGNOSIS — H401132 Primary open-angle glaucoma, bilateral, moderate stage: Secondary | ICD-10-CM | POA: Diagnosis not present

## 2018-09-17 ENCOUNTER — Encounter: Payer: Self-pay | Admitting: Family Medicine

## 2018-09-17 ENCOUNTER — Ambulatory Visit: Payer: Medicare HMO | Admitting: Family Medicine

## 2018-09-17 VITALS — BP 128/82 | HR 58 | Temp 97.9°F | Wt 167.2 lb

## 2018-09-17 DIAGNOSIS — R197 Diarrhea, unspecified: Secondary | ICD-10-CM

## 2018-09-17 DIAGNOSIS — R109 Unspecified abdominal pain: Secondary | ICD-10-CM

## 2018-09-17 LAB — BASIC METABOLIC PANEL
BUN: 22 mg/dL (ref 6–23)
CHLORIDE: 105 meq/L (ref 96–112)
CO2: 32 meq/L (ref 19–32)
Calcium: 9.7 mg/dL (ref 8.4–10.5)
Creatinine, Ser: 1.32 mg/dL — ABNORMAL HIGH (ref 0.40–1.20)
GFR: 41.43 mL/min — ABNORMAL LOW (ref >60.00)
Glucose, Bld: 91 mg/dL (ref 70–99)
POTASSIUM: 3.9 meq/L (ref 3.5–5.1)
Sodium: 143 mEq/L (ref 135–145)

## 2018-09-17 LAB — HEPATIC FUNCTION PANEL
ALT: 25 U/L (ref 0–35)
AST: 21 U/L (ref 0–37)
Albumin: 4.1 g/dL (ref 3.5–5.2)
Alkaline Phosphatase: 53 U/L (ref 39–117)
BILIRUBIN DIRECT: 0.2 mg/dL (ref 0.0–0.3)
BILIRUBIN TOTAL: 1.1 mg/dL (ref 0.2–1.2)
TOTAL PROTEIN: 6.6 g/dL (ref 6.0–8.3)

## 2018-09-17 LAB — LIPASE: LIPASE: 55 U/L (ref 11.0–59.0)

## 2018-09-17 LAB — AMYLASE: AMYLASE: 42 U/L (ref 27–131)

## 2018-09-17 NOTE — Progress Notes (Signed)
   Subjective:    Patient ID: Christina Jacobs, female    DOB: 1941/07/01, 77 y.o.   MRN: [id number]  HPI Here to follow up on GI issues. We saw her a month ago with concerns about an unusual number of cases of pancreatic cancer in her neighborhood. We got some labs which showed a slight elevation in lipase and some slight elevations in the transaminases. She felt fine at that point. She had an abdominal US which was normal. We had ordered an abdominal CT as well, but her insurance company has denied approval for this. Since then she has had a lot of GI problems including generalized abdominal pains. She has alternated between not having a BM for 3-4 days at a time to having explosive uncontrollable diarrhea. No nausea or fever.    Review of Systems  Constitutional: Negative.   Respiratory: Negative.   Cardiovascular: Negative.   Gastrointestinal: Positive for abdominal pain, constipation and diarrhea. Negative for abdominal distention, anal bleeding, blood in stool, nausea and vomiting.  Genitourinary: Negative.        Objective:   Physical Exam  Constitutional: She appears well-developed and well-nourished.  Cardiovascular: Normal rate, regular rhythm, normal heart sounds and intact distal pulses.  Pulmonary/Chest: Effort normal and breath sounds normal.  Abdominal: Soft. Bowel sounds are normal. She exhibits no distension and no mass. There is no rebound and no guarding. No hernia.  Mild generalized tenderness           Assessment & Plan:  She is having abdominal pains with alternating constipation and uncontrollable diarrhea. We will refer her to see GI asap, and they could also address her concerns about her pancreas.  Alysia Penna, MD

## 2018-09-19 ENCOUNTER — Ambulatory Visit (INDEPENDENT_AMBULATORY_CARE_PROVIDER_SITE_OTHER)
Admission: RE | Admit: 2018-09-19 | Discharge: 2018-09-19 | Disposition: A | Discharge status: Home / Self Care | Payer: Medicare HMO | Source: Ambulatory Visit | Attending: Family Medicine | Admitting: Family Medicine

## 2018-09-19 DIAGNOSIS — K5730 Diverticulosis of large intestine without perforation or abscess without bleeding: Secondary | ICD-10-CM | POA: Diagnosis not present

## 2018-09-19 DIAGNOSIS — R109 Unspecified abdominal pain: Secondary | ICD-10-CM

## 2018-09-19 MED ORDER — IOPAMIDOL (ISOVUE-300) INJECTION 61%
80.0000 mL | Freq: Once | INTRAVENOUS | Status: AC | PRN
  Administered 2018-09-19: 80 mL via INTRAVENOUS

## 2018-09-22 ENCOUNTER — Encounter: Payer: Self-pay | Admitting: Family Medicine

## 2018-09-22 NOTE — Telephone Encounter (Signed)
Dr. Fry please advise. Thanks  

## 2018-09-25 NOTE — Telephone Encounter (Signed)
This has been done.

## 2018-09-26 ENCOUNTER — Ambulatory Visit: Payer: Medicare HMO | Admitting: Physician Assistant

## 2018-09-26 ENCOUNTER — Encounter: Payer: Self-pay | Admitting: Physician Assistant

## 2018-09-26 VITALS — BP 138/90 | HR 64 | Ht 65.0 in | Wt 167.0 lb

## 2018-09-26 DIAGNOSIS — K5904 Chronic idiopathic constipation: Secondary | ICD-10-CM | POA: Diagnosis not present

## 2018-09-26 DIAGNOSIS — N39490 Overflow incontinence: Secondary | ICD-10-CM

## 2018-09-26 DIAGNOSIS — K449 Diaphragmatic hernia without obstruction or gangrene: Secondary | ICD-10-CM

## 2018-09-26 NOTE — Patient Instructions (Addendum)
Take Miralax, 17 grams in 8 oz of water. You can take 1/2 dose if stools too loose.   Take Metamucil daily. Take Align daily  For 2 months.   Call in 2 weeks to make an appointment for Jennifer's schedule for the middle of December.   Normal BMI (Body Mass Index- based on height and weight) is between 23 and 30. Your BMI today is Body mass index is 27.79 kg/m. Marland Kitchen Please consider follow up  regarding your BMI with your Primary Care Provider.

## 2018-09-26 NOTE — Progress Notes (Signed)
Reviewed and agree with management plan. Hernia surgery has been explored in the past and surgery has declined to repair.  Miralax with dose titration to adequate bowel movements as outlined.   Pricilla Riffle. Fuller Plan, MD [facility]

## 2018-09-26 NOTE — Progress Notes (Signed)
Chief Complaint: Fecal Incontinence  HPI:    Christina Jacobs is a 77 year old Caucasian female with a past medical history as listed below,(echo 12/17/2017 LVEF 55-60%), known to Dr. Fuller Plan, who was referred to me by Laurey Morale, MD for a complaint of fecal incontinence.      07/26/2017 office visit with Dr. Fuller Plan to discuss constipation with occasional diarrhea and occasional incontinence.  At that time, it was discussed that she likely had overflow diarrhea.  She was continued on her [place] exercises and advised to resume Metamucil and MiraLAX for ongoing constipation.    09/27/2010 colonoscopy with severe diverticulosis in the sigmoid to descending colon and no active bleeding or blood in the colon.  Repeat was recommended in 5 years.  It was noted that this was a difficult colonoscopy as patient had a hernia and some of her bowel was looping in the internal hernia.     09/19/2018 CT abdomen pelvis with contrast showed a stable large hiatal hernia with multiple loops of the small bowel noted within as well as majority of the stomach and pancreas, diverticulosis without diverticulitis, left adrenal adenoma stable and no acute abnormality.    Today, the patient presents to clinic accompanied by her husband who does assist with history.  She explains that she continues with some loose bowel/incontinence issues, this has gotten worse over the past few months.  Patient tells me that she only had this change in bowel habits over the past 3 years or so.  Patient does continue Metamucil on a daily basis but stopped her MiraLAX.  Recently had a CT as above.  Patient tells me that she has had a few accidents when trying to go places recently.  Also discusses times of increased gas.  Tells me that she only has a bowel movement once every 3 to 4 days but then will have loose liquid stool before and sometimes after her other bowel movements.    Denies fever, chills, weight loss, anorexia, nausea, vomiting,  heartburn, reflux or symptoms that awaken her from sleep.  Past Medical History:  Diagnosis Date  . Adenomatous colon polyp   . Anal fissure   . Asthma    Dr. Halford Chessman  . Bowel obstruction ([place])   . Bursitis   . CKD (chronic kidney disease) stage 3, GFR 30-59 ml/min (HCC)    sees Dr. [name]   . Complication of anesthesia    post op N/V  . Diverticulosis   . Glaucoma   . Hiatal hernia    sees Dr. [name] at [facility]   . Hyperlipidemia   . Hypertension   . Hypothyroidism   . Myasthenia gravis ([place]) 2013   followed at [facility] Dr Scheryl Marten, diplopia only  . Osteoarthritis    see's Dr. [name]  . Osteoporosis    last DEXA 08-03-11    Past Surgical History:  Procedure Laterality Date  . ABDOMINAL HYSTERECTOMY    . APPENDECTOMY    . BREAST LUMPECTOMY Bilateral     several, enign breast lumps removed  . CARDIOVERSION N/A 12/31/2017   Procedure: CARDIOVERSION;  Surgeon: Josue Hector, MD;  Location: [facility] ENDOSCOPY;  Service: Cardiovascular;  Laterality: N/A;  . CARDIOVERSION N/A 02/11/2018   Procedure: CARDIOVERSION;  Surgeon: Josue Hector, MD;  Location: [facility] ENDOSCOPY;  Service: Cardiovascular;  Laterality: N/A;  . HERNIA REPAIR  10-18-10   Dr. Hassell Done attempted but could reduce this, the entire stomach is above the diaphragm  . THYROIDECTOMY,  PARTIAL  1999  . TONSILLECTOMY      Current Outpatient Medications  Medication Sig Dispense Refill  . albuterol (PROVENTIL HFA;VENTOLIN HFA) 108 (90 Base) MCG/ACT inhaler Inhale 2 puffs into the lungs every 6 (six) hours as needed for wheezing or shortness of breath.    Marland Kitchen atorvastatin (LIPITOR) 10 MG tablet Take 1 tablet (10 mg total) by mouth daily. 90 tablet 3  . flecainide (TAMBOCOR) 50 MG tablet Take 1 tablet (50 mg total) by mouth 2 (two) times daily. 60 tablet 11  . levothyroxine (SYNTHROID, LEVOTHROID) 100 MCG tablet Take 1 tablet (100 mcg total) by mouth daily. 90 tablet 3  . Melatonin 5 MG CAPS Take 5 mg by mouth  at bedtime as needed (for sleep).    . metoprolol succinate (TOPROL-XL) 100 MG 24 hr tablet Take 100 mg by mouth in the morning and take 50 mg by mouth in the evening. Take with or immediately following a meal. 135 tablet 3  . multivitamin-iron-minerals-folic acid (CENTRUM) chewable tablet Chew 1 tablet by mouth daily.    . predniSONE (DELTASONE) 1 MG tablet Take 4 mg by mouth daily.    . psyllium (METAMUCIL) 58.6 % powder Take 1 packet by mouth daily as needed (for constipation).     . Rivaroxaban (XARELTO) 15 MG TABS tablet Take 1 tablet (15 mg total) by mouth daily with supper. 90 tablet 3  . Travoprost, BAK Free, (TRAVATAN) 0.004 % SOLN ophthalmic solution Place 1 drop into both eyes at bedtime.    . triamterene-hydrochlorothiazide (DYAZIDE) 37.5-25 MG capsule Take 1 each (1 capsule total) by mouth daily. 90 capsule 3   No current facility-administered medications for this visit.     Allergies as of 09/26/2018 - Review Complete 09/19/2018  Allergen Reaction Noted  . Codeine Hives 07/31/2010  . Eliquis [apixaban] Hives 12/19/2017  . Propoxyphene hcl Hives   . Adhesive [tape] Rash and Other (See Comments) 02/04/2018  . Meperidine hcl Rash     Family History  Problem Relation Age of Onset  . Breast cancer Maternal Grandmother   . Diabetes Father   . Hyperlipidemia Father   . Hypertension Father   . Cancer Father        origin unknown  . Kidney cancer Mother   . Clotting disorder Brother   . Cancer Sister        2015, anal ca    Social History   Socioeconomic History  . Marital status: Married    Spouse name: Not on file  . Number of children: 3  . Years of education: Not on file  . Highest education level: Not on file  Occupational History  . Occupation: retired    Comment: [employer]  Social Needs  . Financial resource strain: Not on file  . Food insecurity:    Worry: Not on file    Inability: Not on file  . Transportation needs:    Medical: Not on file     Non-medical: Not on file  Tobacco Use  . Smoking status: Never Smoker  . Smokeless tobacco: Never Used  Substance and Sexual Activity  . Alcohol use: No    Alcohol/week: 0.0 standard drinks  . Drug use: No  . Sexual activity: Not on file  Lifestyle  . Physical activity:    Days per week: Not on file    Minutes per session: Not on file  . Stress: Not on file  Relationships  . Social connections:    Talks on  phone: Not on file    Gets together: Not on file    Attends religious service: Not on file    Active member of club or organization: Not on file    Attends meetings of clubs or organizations: Not on file    Relationship status: Not on file  . Intimate partner violence:    Fear of current or ex partner: Not on file    Emotionally abused: Not on file    Physically abused: Not on file    Forced sexual activity: Not on file  Other Topics Concern  . Not on file  Social History Narrative   Married       Review of Systems:    Constitutional: No weight loss, fever or chills Cardiovascular: No chest pain Respiratory: No SOB  Gastrointestinal: See HPI and otherwise negative   Physical Exam:  Vital signs: BP 138/90   Pulse 64   Ht 5\' 5"  (1.651 m)   Wt 167 lb (75.8 kg)   BMI 27.79 kg/m   Constitutional:   Pleasant Caucasian female appears to be in NAD, Well developed, Well nourished, alert and cooperative Respiratory: Respirations even and unlabored. Lungs clear to auscultation bilaterally.   No wheezes, crackles, or rhonchi.  Cardiovascular: Normal S1, S2. No MRG. Regular rate and rhythm. No peripheral edema, cyanosis or pallor.  Gastrointestinal:  Soft, nondistended, nontender. No rebound or guarding. Normal bowel sounds. No appreciable masses or hepatomegaly. Psychiatric: Demonstrates good judgement and reason without abnormal affect or behaviors.  RELEVANT LABS AND IMAGING: CBC    Component Value Date/Time   WBC 10.5 08/11/2018 1201   RBC 4.93 08/11/2018 1201    HGB 15.3 (H) 08/11/2018 1201   HGB 15.1 02/03/2018 0912   HCT 45.8 08/11/2018 1201   HCT 44.7 02/03/2018 0912   PLT 184.0 08/11/2018 1201   PLT 178 02/03/2018 0912   MCV 92.8 08/11/2018 1201   MCV 92 02/03/2018 0912   MCH 31.1 02/03/2018 0912   MCH 29.5 10/19/2010 0440   MCHC 33.5 08/11/2018 1201   RDW 13.9 08/11/2018 1201   RDW 13.9 02/03/2018 0912   LYMPHSABS 1.8 08/11/2018 1201   LYMPHSABS 2.7 02/03/2018 0912   MONOABS 0.6 08/11/2018 1201   EOSABS 0.1 08/11/2018 1201   EOSABS 0.1 02/03/2018 0912   BASOSABS 0.0 08/11/2018 1201   BASOSABS 0.1 02/03/2018 0912    CMP     Component Value Date/Time   NA 143 09/17/2018 1135   NA 142 02/03/2018 0912   K 3.9 09/17/2018 1135   CL 105 09/17/2018 1135   CO2 32 09/17/2018 1135   GLUCOSE 91 09/17/2018 1135   BUN 22 09/17/2018 1135   BUN 22 02/03/2018 0912   CREATININE 1.32 (H) 09/17/2018 1135   CALCIUM 9.7 09/17/2018 1135   PROT 6.6 09/17/2018 1135   ALBUMIN 4.1 09/17/2018 1135   AST 21 09/17/2018 1135   ALT 25 09/17/2018 1135   ALKPHOS 53 09/17/2018 1135   BILITOT 1.1 09/17/2018 1135   GFRNONAA 36 (L) 02/03/2018 0912   GFRAA 41 (L) 02/03/2018 0912    Assessment: 1.  Constipation with overflow incontinence: Continues for the patient, she has not been using MiraLAX as she thought this was the same as Metamucil 2.  Large hiatal hernia: Unchanged per recent CT  Plan: 1.  Discussed constipation again with the patient. 2.  Recommend the patient start MiraLAX once daily, discussed titration of this to regular soft solid bowel movements. 3.  Encouraged patient to continue  her Metamucil on a daily basis 4.  Discussed Align with the patient.  She can start this once daily for the next 2 months and then discontinue. 5.  Will discuss with Dr. Fuller Plan.  Patient may need referral to surgery. 6.  Patient to follow in clinic with me or Dr. Fuller Plan in 3-4 weeks.  If no improvement with above could consider colonoscopy and then anorectal  manometry.  Ellouise Newer, PA-C [facility] 09/26/2018, 11:09 AM  Cc: Laurey Morale, MD

## 2018-10-15 DIAGNOSIS — Z1231 Encounter for screening mammogram for malignant neoplasm of breast: Secondary | ICD-10-CM | POA: Diagnosis not present

## 2018-12-24 DIAGNOSIS — G7000 Myasthenia gravis without (acute) exacerbation: Secondary | ICD-10-CM | POA: Diagnosis not present

## 2018-12-24 DIAGNOSIS — Z79899 Other long term (current) drug therapy: Secondary | ICD-10-CM | POA: Diagnosis not present

## 2019-01-02 NOTE — Progress Notes (Signed)
78 y.o. wife of one of my patients. Previously seen by Dr Percival Spanish. . Noted to be in afib by PCP 12/2017  Rx with Eliquis and beta blocker ? Allergic reaction to Eliquis  Now on Xarelto low dose due to CKD   12/31/17 I performed her [facility] and she failed to convert with 4 defibrillations Started on flecainide ETT done 01/31/18 reviewed and normal with no pro arrhythmia or QRS widening. Echo reviewed 12/17/17 EF 55-60% otherwise normal.     Repeat DCC on flecainide successful 02/11/18 Was in NSR f/u afib clinic on 02/25/18  Monitor 04/28/18 average HR 56 bpm NSR 99% of time   TSH normal 08/11/18   See GI for incontinence large hiatal hernia and diverticulosis    ECG:  SR rate 53 nonspecific ST changes   She has Myesthenia and was concerned that beta blockers were making lid lag worse   Past Medical History:  Diagnosis Date  . Adenomatous colon polyp   . Anal fissure   . Asthma    Dr. Halford Chessman  . Bowel obstruction ([place])   . Bursitis   . CKD (chronic kidney disease) stage 3, GFR 30-59 ml/min (HCC)    sees Dr. [name]   . Complication of anesthesia    post op N/V  . Diverticulosis   . Glaucoma   . Hiatal hernia    sees Dr. [name] at [facility]   . Hyperlipidemia   . Hypertension   . Hypothyroidism   . Myasthenia gravis ([place]) 2013   followed at [facility] Dr Scheryl Marten, diplopia only  . Osteoarthritis    see's Dr. [name]  . Osteoporosis    last DEXA 08-03-11   Past Surgical History:  Procedure Laterality Date  . ABDOMINAL HYSTERECTOMY    . APPENDECTOMY    . BREAST LUMPECTOMY Bilateral     several, enign breast lumps removed  . CARDIOVERSION N/A 12/31/2017   Procedure: CARDIOVERSION;  Surgeon: Josue Hector, MD;  Location: [facility] ENDOSCOPY;  Service: Cardiovascular;  Laterality: N/A;  . CARDIOVERSION N/A 02/11/2018   Procedure: CARDIOVERSION;  Surgeon: Josue Hector, MD;  Location: [facility] ENDOSCOPY;  Service: Cardiovascular;  Laterality: N/A;  . HERNIA REPAIR  10-18-10   Dr.  Hassell Done attempted but could reduce this, the entire stomach is above the diaphragm  . THYROIDECTOMY, PARTIAL  1999  . TONSILLECTOMY      Current Outpatient Medications  Medication Sig Dispense Refill  . albuterol (PROVENTIL HFA;VENTOLIN HFA) 108 (90 Base) MCG/ACT inhaler Inhale 2 puffs into the lungs every 6 (six) hours as needed for wheezing or shortness of breath.    Marland Kitchen atorvastatin (LIPITOR) 10 MG tablet Take 1 tablet (10 mg total) by mouth daily. 90 tablet 3  . flecainide (TAMBOCOR) 50 MG tablet Take 1 tablet (50 mg total) by mouth 2 (two) times daily. 60 tablet 11  . levothyroxine (SYNTHROID, LEVOTHROID) 100 MCG tablet Take 1 tablet (100 mcg total) by mouth daily. 90 tablet 3  . Melatonin 5 MG CAPS Take 5 mg by mouth at bedtime as needed (for sleep).    . metoprolol succinate (TOPROL-XL) 100 MG 24 hr tablet Take 100 mg by mouth in the morning and take 50 mg by mouth in the evening. Take with or immediately following a meal. 135 tablet 3  . multivitamin-iron-minerals-folic acid (CENTRUM) chewable tablet Chew 1 tablet by mouth daily.    . predniSONE (DELTASONE) 1 MG tablet Take 4 mg by mouth daily.    Marland Kitchen  psyllium (METAMUCIL) 58.6 % powder Take 1 packet by mouth daily as needed (for constipation).     . Rivaroxaban (XARELTO) 15 MG TABS tablet Take 1 tablet (15 mg total) by mouth daily with supper. 90 tablet 3  . Travoprost, BAK Free, (TRAVATAN) 0.004 % SOLN ophthalmic solution Place 1 drop into both eyes at bedtime.    . triamterene-hydrochlorothiazide (DYAZIDE) 37.5-25 MG capsule Take 1 each (1 capsule total) by mouth daily. 90 capsule 3   No current facility-administered medications for this visit.     Allergies  Allergen Reactions  . Codeine Hives  . Eliquis [Apixaban] Hives  . Propoxyphene Hcl Hives  . Meperidine Hcl Rash  . Tape Rash and Other (See Comments)    EKG patches EKG patches    Social History   Socioeconomic History  . Marital status: Married    Spouse name: Not on  file  . Number of children: 3  . Years of education: Not on file  . Highest education level: Not on file  Occupational History  . Occupation: retired    Comment: [employer]  Social Needs  . Financial resource strain: Not on file  . Food insecurity:    Worry: Not on file    Inability: Not on file  . Transportation needs:    Medical: Not on file    Non-medical: Not on file  Tobacco Use  . Smoking status: Never Smoker  . Smokeless tobacco: Never Used  Substance and Sexual Activity  . Alcohol use: No    Alcohol/week: 0.0 standard drinks  . Drug use: No  . Sexual activity: Not on file  Lifestyle  . Physical activity:    Days per week: Not on file    Minutes per session: Not on file  . Stress: Not on file  Relationships  . Social connections:    Talks on phone: Not on file    Gets together: Not on file    Attends religious service: Not on file    Active member of club or organization: Not on file    Attends meetings of clubs or organizations: Not on file    Relationship status: Not on file  . Intimate partner violence:    Fear of current or ex partner: Not on file    Emotionally abused: Not on file    Physically abused: Not on file    Forced sexual activity: Not on file  Other Topics Concern  . Not on file  Social History Narrative   Married       Family History  Problem Relation Age of Onset  . Breast cancer Maternal Grandmother   . Diabetes Father   . Hyperlipidemia Father   . Hypertension Father   . Cancer Father        origin unknown  . Kidney cancer Mother   . Clotting disorder Brother   . Cancer Sister        2015, anal ca    ROS- All systems are reviewed and negative except as per the HPI above  Physical Exam: Vitals:   01/07/19 1421  BP: 140/88  Pulse: (!) 53  SpO2: 94%  Weight: 75.7 kg  Height: 5\' 6"  (1.676 m)   Wt Readings from Last 3 Encounters:  01/07/19 75.7 kg  09/26/18 75.8 kg  09/17/18 75.8 kg    Labs: Lab Results  Component  Value Date   NA 143 09/17/2018   K 3.9 09/17/2018   CL 105 09/17/2018   CO2  32 09/17/2018   GLUCOSE 91 09/17/2018   BUN 22 09/17/2018   CREATININE 1.32 (H) 09/17/2018   CALCIUM 9.7 09/17/2018   Lab Results  Component Value Date   INR 1.3 (H) 02/03/2018   Lab Results  Component Value Date   CHOL 159 08/11/2018   HDL 71.30 08/11/2018   LDLCALC 66 08/11/2018   TRIG 108.0 08/11/2018     BP 140/88   Pulse (!) 53   Ht 5\' 6"  (1.676 m)   Wt 75.7 kg   SpO2 94%   BMI 26.92 kg/m  Affect appropriate Healthy:  appears stated age 17: normal Neck supple with no adenopathy JVP normal no bruits no thyromegaly Lungs clear with no wheezing and good diaphragmatic motion Heart:  S1/S2 no murmur, no rub, gallop or click PMI normal Abdomen: benighn, BS positve, no tenderness, no AAA no bruit.  No HSM or HJR Distal pulses intact with no bruits No edema Neuro non-focal Skin warm and dry No muscular weakness    Echo-Study Conclusions  - Left ventricle: The cavity size was normal. There was mild focal   basal hypertrophy of the septum. Systolic function was normal.   The estimated ejection fraction was in the range of 55% to 60%.   The study was not technically sufficient to allow evaluation of   LV diastolic dysfunction due to atrial fibrillation. - Pulmonary arteries: PA peak pressure: 33 mm Hg (S).    Assessment and Plan: 1 PAF: post successful [place] on flecainide 02/11/18 anticoagulation with xarelto stable   Monitor 04/28/18 with average HR 56 bpm and NSr 99% of time on higher dose of flecainide 75 mg bid Beta blocker reduced post conversion   2. Chadsvasc score of at least 4  She has had CrCl less than 50 so has been placed on 15 mg dose of Xarelto allergic to eliquis   3. CRF:  Stable avoid over diuresis CR 1.42 02/03/18 GFR 41   4. Thyroid:  TSH normal   5. HLD:  On statin LDL 66 with normal LFTls 08/11/18  6. GI:  Large hiatal hernia, overflow incontinence  diverticulosis f/u Fuller Plan Had consult at [facility] and she declined surgeyr   7. Myesthenia:  Thinks beta blockers may be making lid lag worse. Decrease dose to 50 mg in am   Jenkins Rouge

## 2019-01-07 ENCOUNTER — Ambulatory Visit: Payer: Medicare HMO | Admitting: Cardiovascular Disease

## 2019-01-07 ENCOUNTER — Encounter: Payer: Self-pay | Admitting: Cardiovascular Disease

## 2019-01-07 VITALS — BP 140/88 | HR 53 | Ht 66.0 in | Wt 166.8 lb

## 2019-01-07 DIAGNOSIS — I4891 Unspecified atrial fibrillation: Secondary | ICD-10-CM

## 2019-01-07 MED ORDER — METOPROLOL SUCCINATE ER 50 MG PO TB24: ORAL_TABLET | ORAL | 3 refills | Status: DC

## 2019-01-07 NOTE — Patient Instructions (Addendum)
Medication Instructions:  Your physician has recommended you make the following change in your medication:  1-Decrease metoprolol 50 mg by mouth daily.   If you need a refill on your cardiac medications before your next appointment, please call your pharmacy.   Lab work:  If you have labs (blood work) drawn today and your tests are completely normal, you will receive your results only by: Marland Kitchen MyChart Message (if you have MyChart) OR . A paper copy in the mail If you have any lab test that is abnormal or we need to change your treatment, we will call you to review the results.  Testing/Procedures: None ordered today.  Follow-Up: At [facility], you and your health needs are our priority.  As part of our continuing mission to provide you with exceptional heart care, we have created designated Provider Care Teams.  These Care Teams include your primary Cardiologist (physician) and Advanced Practice Providers (APPs -  Physician Assistants and Nurse Practitioners) who all work together to provide you with the care you need, when you need it. You will need a follow up appointment in 3 months. You may see Dr. Johnsie Cancel or one of the following Advanced Practice Providers on your designated Care Team:   Truitt Merle, NP Cecilie Kicks, NP . Kathyrn Drown, NP

## 2019-04-06 DIAGNOSIS — L820 Inflamed seborrheic keratosis: Secondary | ICD-10-CM | POA: Diagnosis not present

## 2019-04-06 DIAGNOSIS — D692 Other nonthrombocytopenic purpura: Secondary | ICD-10-CM | POA: Diagnosis not present

## 2019-04-06 DIAGNOSIS — D1801 Hemangioma of skin and subcutaneous tissue: Secondary | ICD-10-CM | POA: Diagnosis not present

## 2019-04-06 DIAGNOSIS — L812 Freckles: Secondary | ICD-10-CM | POA: Diagnosis not present

## 2019-04-06 DIAGNOSIS — L821 Other seborrheic keratosis: Secondary | ICD-10-CM | POA: Diagnosis not present

## 2019-04-06 DIAGNOSIS — L570 Actinic keratosis: Secondary | ICD-10-CM | POA: Diagnosis not present

## 2019-04-06 DIAGNOSIS — Z85828 Personal history of other malignant neoplasm of skin: Secondary | ICD-10-CM | POA: Diagnosis not present

## 2019-04-08 DIAGNOSIS — H401132 Primary open-angle glaucoma, bilateral, moderate stage: Secondary | ICD-10-CM | POA: Diagnosis not present

## 2019-04-20 ENCOUNTER — Telehealth: Payer: Self-pay

## 2019-04-20 NOTE — Telephone Encounter (Signed)
Virtual Visit Pre-Appointment Phone Call  "(Name), I am calling you today to discuss your upcoming appointment. We are currently trying to limit exposure to the virus that causes COVID-19 by seeing patients at home rather than in the office."  1. "What is the BEST phone number to call the day of the visit?" - include this in appointment notes  2. "Do you have or have access to (through a family member/friend) a smartphone with video capability that we can use for your visit?" a. If yes - list this number in appt notes as "cell" (if different from BEST phone #) and list the appointment type as a VIDEO visit in appointment notes b. If no - list the appointment type as a PHONE visit in appointment notes  3. Confirm consent - "In the setting of the current Covid19 crisis, you are scheduled for a (phone or video) visit with your provider on (date) at (time).  Just as we do with many in-office visits, in order for you to participate in this visit, we must obtain consent.  If you'd like, I can send this to your mychart (if signed up) or email for you to review.  Otherwise, I can obtain your verbal consent now.  All virtual visits are billed to your insurance company just like a normal visit would be.  By agreeing to a virtual visit, we'd like you to understand that the technology does not allow for your provider to perform an examination, and thus may limit your provider's ability to fully assess your condition. If your provider identifies any concerns that need to be evaluated in person, we will make arrangements to do so.  Finally, though the technology is pretty good, we cannot assure that it will always work on either your or our end, and in the setting of a video visit, we may have to convert it to a phone-only visit.  In either situation, we cannot ensure that we have a secure connection.  Are you willing to proceed? YES  4. Advise patient to be prepared - "Two hours prior to your appointment, go  ahead and check your blood pressure, pulse, oxygen saturation, and your weight (if you have the equipment to check those) and write them all down. When your visit starts, your provider will ask you for this information. If you have an Apple Watch or Kardia device, please plan to have heart rate information ready on the day of your appointment. Please have a pen and paper handy nearby the day of the visit as well."  5. Give patient instructions for MyChart download to smartphone OR Doximity/Doxy.me as below if video visit (depending on what platform provider is using)  6. Inform patient they will receive a phone call 15 minutes prior to their appointment time (may be from unknown caller ID) so they should be prepared to answer    TELEPHONE CALL NOTE  MAYDA SHIPPEE has been deemed a candidate for a follow-up tele-health visit to limit community exposure during the Covid-19 pandemic. I spoke with the patient via phone to ensure availability of phone/video source, confirm preferred email & phone number, and discuss instructions and expectations.  I reminded ZARAHI FUERST to be prepared with any vital sign and/or heart rhythm information that could potentially be obtained via home monitoring, at the time of her visit. I reminded JAMARIAH TONY to expect a phone call prior to her visit.  Michaelyn Barter, RN 04/20/2019 2:48 PM  IF USING  DOXIMITY or DOXY.ME - The patient will receive a link just prior to their visit by text.     FULL LENGTH CONSENT FOR TELE-HEALTH VISIT   I hereby voluntarily request, consent and authorize [facility] and its employed or contracted physicians, physician assistants, nurse practitioners or other licensed health care professionals (the Practitioner), to provide me with telemedicine health care services (the "Services") as deemed necessary by the treating Practitioner. I acknowledge and consent to receive the Services by the Practitioner via telemedicine.  I understand that the telemedicine visit will involve communicating with the Practitioner through live audiovisual communication technology and the disclosure of certain medical information by electronic transmission. I acknowledge that I have been given the opportunity to request an in-person assessment or other available alternative prior to the telemedicine visit and am voluntarily participating in the telemedicine visit.  I understand that I have the right to withhold or withdraw my consent to the use of telemedicine in the course of my care at any time, without affecting my right to future care or treatment, and that the Practitioner or I may terminate the telemedicine visit at any time. I understand that I have the right to inspect all information obtained and/or recorded in the course of the telemedicine visit and may receive copies of available information for a reasonable fee.  I understand that some of the potential risks of receiving the Services via telemedicine include:  Marland Kitchen Delay or interruption in medical evaluation due to technological equipment failure or disruption; . Information transmitted may not be sufficient (e.g. poor resolution of images) to allow for appropriate medical decision making by the Practitioner; and/or  . In rare instances, security protocols could fail, causing a breach of personal health information.  Furthermore, I acknowledge that it is my responsibility to provide information about my medical history, conditions and care that is complete and accurate to the best of my ability. I acknowledge that Practitioner's advice, recommendations, and/or decision may be based on factors not within their control, such as incomplete or inaccurate data provided by me or distortions of diagnostic images or specimens that may result from electronic transmissions. I understand that the practice of medicine is not an exact science and that Practitioner makes no warranties or guarantees  regarding treatment outcomes. I acknowledge that I will receive a copy of this consent concurrently upon execution via email to the email address I last provided but may also request a printed copy by calling the office of [facility].    I understand that my insurance will be billed for this visit.   I have read or had this consent read to me. . I understand the contents of this consent, which adequately explains the benefits and risks of the Services being provided via telemedicine.  . I have been provided ample opportunity to ask questions regarding this consent and the Services and have had my questions answered to my satisfaction. . I give my informed consent for the services to be provided through the use of telemedicine in my medical care  By participating in this telemedicine visit I agree to the above.

## 2019-04-23 ENCOUNTER — Other Ambulatory Visit: Payer: Self-pay | Admitting: Cardiovascular Disease

## 2019-04-24 NOTE — Progress Notes (Signed)
Virtual Visit via Telephone Note   This visit type was conducted due to national recommendations for restrictions regarding the COVID-19 Pandemic (e.g. social distancing) in an effort to limit this patient's exposure and mitigate transmission in our community.  Due to her co-morbid illnesses, this patient is at least at moderate risk for complications without adequate follow up.  This format is felt to be most appropriate for this patient at this time.  The patient did not have access to video technology/had technical difficulties with video requiring transitioning to audio format only (telephone).  All issues noted in this document were discussed and addressed.  No physical exam could be performed with this format.  Please refer to the patient's chart for her  consent to telehealth for [facility].   Date:  04/27/2019   ID:  Christina Jacobs, DOB 1941/10/29, MRN [id number]  Patient Location: Home Provider Location: Office  PCP:  Laurey Morale, MD  Cardiologist:   Johnsie Cancel Electrophysiologist:  None   Evaluation Performed:  Follow-Up Visit  Chief Complaint:  PAF  History of Present Illness:    78 y.o. 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 Allergic reaction to eliquis now on Xarelto. Low dose due to CKD. Failed [place] and started on flecainide ETT normal 01/31/18 TTE 12/17/17 EF 55-60% otherwise normal . Successful DCC on AAT 01/31/18 June 2019 monitor showed 99% NSR. Has Myasthenia ? Beta blocker making lid lag worse and dose decreased to 50 mg am March 2020  Needs f/u lab work with primary   The patient  does not have symptoms concerning for COVID-19 infection (fever, chills, cough, or new shortness of breath).    Past Medical History:  Diagnosis Date  . Adenomatous colon polyp   . Anal fissure   . Asthma    Dr. Halford Chessman  . Bowel obstruction ([place])   . Bursitis   . CKD (chronic kidney disease) stage 3, GFR 30-59 ml/min (HCC)    sees Dr. [name]   . Complication of anesthesia    post op N/V   . Diverticulosis   . Glaucoma   . Hiatal hernia    sees Dr. [name] at [facility]   . Hyperlipidemia   . Hypertension   . Hypothyroidism   . 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 Osteoarthritis    see's Dr. [name]  . Osteoporosis    last DEXA 08-03-11   Past Surgical History:  Procedure Laterality Date  . ABDOMINAL HYSTERECTOMY    . APPENDECTOMY    . BREAST LUMPECTOMY Bilateral     several, enign breast lumps removed  . CARDIOVERSION N/A 12/31/2017   Procedure: CARDIOVERSION;  Surgeon: Josue Hector, MD;  Location: [facility] ENDOSCOPY;  Service: Cardiovascular;  Laterality: N/A;  . CARDIOVERSION N/A 02/11/2018   Procedure: CARDIOVERSION;  Surgeon: Josue Hector, MD;  Location: [facility] ENDOSCOPY;  Service: Cardiovascular;  Laterality: N/A;  . HERNIA REPAIR  10-18-10   Dr. Hassell Done attempted but could reduce this, the entire stomach is above the diaphragm  . THYROIDECTOMY, PARTIAL  1999  . TONSILLECTOMY       Current Meds  Medication Sig  . albuterol (PROVENTIL HFA;VENTOLIN HFA) 108 (90 Base) MCG/ACT inhaler Inhale 2 puffs into the lungs every 6 (six) hours as needed for wheezing or shortness of breath.  Marland Kitchen atorvastatin (LIPITOR) 10 MG tablet Take 1 tablet (10 mg total) by mouth daily.  . flecainide (TAMBOCOR) 50 MG tablet Take  1 tablet (50 mg total) by mouth 2 (two) times daily.  Marland Kitchen levothyroxine (SYNTHROID, LEVOTHROID) 100 MCG tablet Take 1 tablet (100 mcg total) by mouth daily.  . Melatonin 5 MG CAPS Take 5 mg by mouth at bedtime as needed (for sleep).  . metoprolol succinate (TOPROL-XL) 50 MG 24 hr tablet TAKE 100 MG BY MOUTH IN THE MORNING AND TAKE 50 MG BY MOUTH IN THE EVENING. TAKE WITH OR IMMEDIATELY FOLLOWING A MEAL.  . multivitamin-iron-minerals-folic acid (CENTRUM) chewable tablet Chew 1 tablet by mouth daily.  . predniSONE (DELTASONE) 1 MG tablet Take 4 mg by mouth daily.  . psyllium (METAMUCIL) 58.6 % powder Take 1 packet by  mouth daily as needed (for constipation).   . Rivaroxaban (XARELTO) 15 MG TABS tablet Take 1 tablet (15 mg total) by mouth daily with supper.  . Travoprost, BAK Free, (TRAVATAN) 0.004 % SOLN ophthalmic solution Place 1 drop into both eyes at bedtime.  . triamterene-hydrochlorothiazide (DYAZIDE) 37.5-25 MG capsule Take 1 each (1 capsule total) by mouth daily.     Allergies:   Codeine, Eliquis [apixaban], Propoxyphene hcl, Meperidine hcl, and Tape   Social History   Tobacco Use  . Smoking status: Never Smoker  . Smokeless tobacco: Never Used  Substance Use Topics  . Alcohol use: No    Alcohol/week: 0.0 standard drinks  . Drug use: No     Family Hx: The patient's family history includes Breast cancer in her maternal grandmother; Cancer in her father and sister; Clotting disorder in her brother; Diabetes in her father; Hyperlipidemia in her father; Hypertension in her father; Kidney cancer in her mother.  ROS:   Please see the history of present illness.     All other systems reviewed and are negative.   Prior CV studies:   The following studies were reviewed today:  Echo 12/17/17 Holter: 12/17/17 ETT 01/31/18  Labs/Other Tests and Data Reviewed:    EKG:  SR rate 53 nonspecific ST changes QT normal 01/07/19  Recent Labs: 08/11/2018: Hemoglobin 15.3; Platelets 184.0; TSH 0.76 09/17/2018: ALT 25; BUN 22; Creatinine, Ser 1.32; Potassium 3.9; Sodium 143   Recent Lipid Panel Lab Results  Component Value Date/Time   CHOL 159 08/11/2018 12:01 PM   TRIG 108.0 08/11/2018 12:01 PM   HDL 71.30 08/11/2018 12:01 PM   CHOLHDL 2 08/11/2018 12:01 PM   LDLCALC 66 08/11/2018 12:01 PM   LDLDIRECT 142.2 07/27/2013 10:07 AM    Wt Readings from Last 3 Encounters:  04/27/19 73.9 kg  01/07/19 75.7 kg  09/26/18 75.8 kg     Objective:    Vital Signs:  BP (!) 163/89   Pulse (!) 58   Ht 5\' 8"  (1.727 m)   Wt 73.9 kg   BMI 24.78 kg/m    Skin warm and dry No distress No tachypnea No  JVP elevation  Neuro appears non focal No edema  Telephone visit no exam   ASSESSMENT & PLAN:    1 PAF: post successful [place] on flecainide 02/11/18 anticoagulation with xarelto stable   Monitor 04/28/18 with average HR 56 bpm and NSr 99% of time on higher dose of flecainide 75 mg bid Beta blocker reduced post conversion   2. Chadsvasc score of at least 4  She has had CrCl less than 50 so has been placed on 15 mg dose of Xarelto allergic to eliquis   3. CRF:  Stable avoid over diuresis CR 1.42 02/03/18 GFR 41   4. Thyroid:  TSH normal  5. HLD:  On statin LDL 66 with normal LFTls 08/11/18  6. GI:  Large hiatal hernia, overflow incontinence diverticulosis f/u Fuller Plan Had consult at [facility] and she declined surgeyr   7. Myesthenia:  Thinks beta blockers may be making lid lag worse. Decrease dose to 50 mg in am  COVID-19 Education: The signs and symptoms of COVID-19 were discussed with the patient and how to seek care for testing (follow up with PCP or arrange E-visit).  The importance of social distancing was discussed today.  Time:   Today, I have spent 30 minutes with the patient with telehealth technology discussing the above problems.     Medication Adjustments/Labs and Tests Ordered: Current medicines are reviewed at length with the patient today.  Concerns regarding medicines are outlined above.   Tests Ordered:  None   Medication Changes:  None   Disposition:  Follow up in a year  Signed, Jenkins Rouge, MD  04/27/2019 8:31 AM    [place]

## 2019-04-27 ENCOUNTER — Encounter: Payer: Self-pay | Admitting: Cardiovascular Disease

## 2019-04-27 ENCOUNTER — Other Ambulatory Visit: Payer: Self-pay

## 2019-04-27 ENCOUNTER — Telehealth (INDEPENDENT_AMBULATORY_CARE_PROVIDER_SITE_OTHER): Payer: Medicare HMO | Admitting: Cardiovascular Disease

## 2019-04-27 VITALS — BP 163/89 | HR 58 | Ht 68.0 in | Wt 163.0 lb

## 2019-04-27 DIAGNOSIS — I480 Paroxysmal atrial fibrillation: Secondary | ICD-10-CM

## 2019-04-27 MED ORDER — FLECAINIDE ACETATE 50 MG PO TABS: 50.0000 mg | ORAL_TABLET | Freq: Two times a day (BID) | ORAL | 3 refills | Status: DC

## 2019-04-27 MED ORDER — RIVAROXABAN 15 MG PO TABS: 15.0000 mg | ORAL_TABLET | Freq: Every day | ORAL | 3 refills | Status: DC

## 2019-04-27 NOTE — Patient Instructions (Addendum)

## 2019-05-18 ENCOUNTER — Encounter: Payer: Self-pay | Admitting: Family Medicine

## 2019-05-19 NOTE — Telephone Encounter (Signed)
She will need an in person exam for this

## 2019-05-22 ENCOUNTER — Ambulatory Visit: Payer: Medicare HMO | Admitting: Family Medicine

## 2019-05-22 ENCOUNTER — Encounter: Payer: Self-pay | Admitting: Family Medicine

## 2019-05-22 ENCOUNTER — Other Ambulatory Visit: Payer: Self-pay

## 2019-05-22 VITALS — BP 160/96 | HR 68 | Temp 98.7°F | Wt 168.4 lb

## 2019-05-22 DIAGNOSIS — M940 Chondrocostal junction syndrome [Tietze]: Secondary | ICD-10-CM

## 2019-05-22 MED ORDER — PREDNISONE 10 MG PO TABS: ORAL_TABLET | ORAL | 0 refills | Status: DC

## 2019-05-22 MED ORDER — OMEPRAZOLE 40 MG PO CPDR: 40.0000 mg | DELAYED_RELEASE_CAPSULE | Freq: Every day | ORAL | 11 refills | Status: DC | PRN

## 2019-05-22 NOTE — Progress Notes (Signed)
   Subjective:    Patient ID: Christina Jacobs, female    DOB: [birth year], 78 y.o.   MRN: [id number]  HPI Here for a sore spot on the left breast that appeared last December. She remembers "poking herself" with something around that time but she does not remember what it was. She had a normal bilateral mammogram on 10-15-18. There has been no redness or skin changes, no nipple DC, no swelling in the axilla. No cough or SOB or fever.    Review of Systems  Constitutional: Negative.   Respiratory: Negative.   Cardiovascular: Positive for chest pain. Negative for palpitations and leg swelling.       Objective:   Physical Exam Constitutional:      General: She is not in acute distress.    Appearance: Normal appearance.  Cardiovascular:     Rate and Rhythm: Normal rate and regular rhythm.     Pulses: Normal pulses.     Heart sounds: Normal heart sounds.  Pulmonary:     Effort: Pulmonary effort is normal.     Breath sounds: Normal breath sounds.     Comments: She is quite tender across the chest, especially along the left sternal border  Genitourinary:    Comments: Both breasts are normal on exam with no masses and no tenderness at all, axillae show no masses  Lymphadenopathy:     Cervical: No cervical adenopathy.  Neurological:     Mental Status: She is alert.           Assessment & Plan:  Costochondritis, we will treat with a Prednisone taper over 20 days. Recheck prn.  Alysia Penna, MD

## 2019-07-09 DIAGNOSIS — Z85828 Personal history of other malignant neoplasm of skin: Secondary | ICD-10-CM | POA: Diagnosis not present

## 2019-07-09 DIAGNOSIS — D485 Neoplasm of uncertain behavior of skin: Secondary | ICD-10-CM | POA: Diagnosis not present

## 2019-07-09 DIAGNOSIS — L570 Actinic keratosis: Secondary | ICD-10-CM | POA: Diagnosis not present

## 2019-08-04 ENCOUNTER — Other Ambulatory Visit: Payer: Self-pay | Admitting: Family Medicine

## 2019-08-19 ENCOUNTER — Encounter: Payer: Self-pay | Admitting: Family Medicine

## 2019-08-19 ENCOUNTER — Other Ambulatory Visit: Payer: Self-pay

## 2019-08-19 ENCOUNTER — Ambulatory Visit (INDEPENDENT_AMBULATORY_CARE_PROVIDER_SITE_OTHER): Payer: Medicare HMO | Admitting: Family Medicine

## 2019-08-19 VITALS — BP 138/90 | HR 65 | Temp 98.3°F | Ht 67.0 in | Wt 167.8 lb

## 2019-08-19 DIAGNOSIS — E785 Hyperlipidemia, unspecified: Secondary | ICD-10-CM | POA: Diagnosis not present

## 2019-08-19 DIAGNOSIS — I480 Paroxysmal atrial fibrillation: Secondary | ICD-10-CM

## 2019-08-19 DIAGNOSIS — E039 Hypothyroidism, unspecified: Secondary | ICD-10-CM

## 2019-08-19 DIAGNOSIS — G7000 Myasthenia gravis without (acute) exacerbation: Secondary | ICD-10-CM | POA: Diagnosis not present

## 2019-08-19 DIAGNOSIS — Z23 Encounter for immunization: Secondary | ICD-10-CM | POA: Diagnosis not present

## 2019-08-19 DIAGNOSIS — K219 Gastro-esophageal reflux disease without esophagitis: Secondary | ICD-10-CM

## 2019-08-19 DIAGNOSIS — J4530 Mild persistent asthma, uncomplicated: Secondary | ICD-10-CM | POA: Diagnosis not present

## 2019-08-19 DIAGNOSIS — M810 Age-related osteoporosis without current pathological fracture: Secondary | ICD-10-CM

## 2019-08-19 DIAGNOSIS — N183 Chronic kidney disease, stage 3 unspecified: Secondary | ICD-10-CM

## 2019-08-19 LAB — CBC WITH DIFFERENTIAL/PLATELET
Basophils Absolute: 0 10*3/uL (ref 0.0–0.1)
Basophils Relative: 0.4 % (ref 0.0–3.0)
Eosinophils Absolute: 0.1 10*3/uL (ref 0.0–0.7)
Eosinophils Relative: 0.8 % (ref 0.0–5.0)
HCT: 44.6 % (ref 36.0–46.0)
Hemoglobin: 14.9 g/dL (ref 12.0–15.0)
Lymphocytes Relative: 24.1 % (ref 12.0–46.0)
Lymphs Abs: 2.2 10*3/uL (ref 0.7–4.0)
MCHC: 33.4 g/dL (ref 30.0–36.0)
MCV: 92.6 fl (ref 78.0–100.0)
Monocytes Absolute: 0.7 10*3/uL (ref 0.1–1.0)
Monocytes Relative: 7.9 % (ref 3.0–12.0)
Neutro Abs: 6.1 10*3/uL (ref 1.4–7.7)
Neutrophils Relative %: 66.8 % (ref 43.0–77.0)
Platelets: 178 10*3/uL (ref 150.0–400.0)
RBC: 4.81 Mil/uL (ref 3.87–5.11)
RDW: 13.5 % (ref 11.5–15.5)
WBC: 9.2 10*3/uL (ref 4.0–10.5)

## 2019-08-19 LAB — HEPATIC FUNCTION PANEL
ALT: 18 U/L (ref 0–35)
AST: 21 U/L (ref 0–37)
Albumin: 4.2 g/dL (ref 3.5–5.2)
Alkaline Phosphatase: 55 U/L (ref 39–117)
Bilirubin, Direct: 0.3 mg/dL (ref 0.0–0.3)
Total Bilirubin: 1.6 mg/dL — ABNORMAL HIGH (ref 0.2–1.2)
Total Protein: 6.5 g/dL (ref 6.0–8.3)

## 2019-08-19 LAB — LIPID PANEL
Cholesterol: 177 mg/dL (ref 0–200)
HDL: 68.9 mg/dL (ref >39.00)
LDL Cholesterol: 79 mg/dL (ref 0–99)
NonHDL: 107.75
Total CHOL/HDL Ratio: 3
Triglycerides: 146 mg/dL (ref 0.0–149.0)
VLDL: 29.2 mg/dL (ref 0.0–40.0)

## 2019-08-19 LAB — BASIC METABOLIC PANEL
BUN: 24 mg/dL — ABNORMAL HIGH (ref 6–23)
CO2: 30 mEq/L (ref 19–32)
Calcium: 10 mg/dL (ref 8.4–10.5)
Chloride: 101 mEq/L (ref 96–112)
Creatinine, Ser: 1.51 mg/dL — ABNORMAL HIGH (ref 0.40–1.20)
GFR: 33.3 mL/min — ABNORMAL LOW (ref >60.00)
Glucose, Bld: 91 mg/dL (ref 70–99)
Potassium: 4.2 mEq/L (ref 3.5–5.1)
Sodium: 140 mEq/L (ref 135–145)

## 2019-08-19 LAB — T4, FREE: Free T4: 1.51 ng/dL (ref 0.60–1.60)

## 2019-08-19 LAB — TSH: TSH: 0.28 u[IU]/mL — ABNORMAL LOW (ref 0.35–4.50)

## 2019-08-19 LAB — T3, FREE: T3, Free: 3.4 pg/mL (ref 2.3–4.2)

## 2019-08-19 MED ORDER — METOPROLOL SUCCINATE ER 50 MG PO TB24: 50.0000 mg | ORAL_TABLET | Freq: Every day | ORAL | 1 refills | Status: DC

## 2019-08-19 NOTE — Patient Instructions (Signed)
Health Maintenance Due  Topic Date Due  . INFLUENZA VACCINE  06/06/2019    Depression screen [facility] 2/9 08/11/2018 08/02/2015 07/30/2014  Decreased Interest 0 0 0  Down, Depressed, Hopeless 0 0 0  PHQ - 2 Score 0 0 0

## 2019-08-19 NOTE — Progress Notes (Signed)
   Subjective:    Patient ID: Christina Jacobs, female    DOB: April 07, 1941, 78 y.o.   MRN: [id number]  HPI Here to follow up on issues. She feels well in general. She thinks the atrial fibrillation has been gone for some time because she no longer feels any flutters. No chest pain or SOB. Her BP is stable. Her mammogram is set for December.    Review of Systems  Constitutional: Negative.   HENT: Negative.   Eyes: Negative.   Respiratory: Negative.   Cardiovascular: Negative.   Gastrointestinal: Negative.   Genitourinary: Negative for decreased urine volume, difficulty urinating, dyspareunia, dysuria, enuresis, flank pain, frequency, hematuria, pelvic pain and urgency.  Musculoskeletal: Negative.   Skin: Negative.   Neurological: Negative.   Psychiatric/Behavioral: Negative.        Objective:   Physical Exam Constitutional:      General: She is not in acute distress.    Appearance: She is well-developed.  HENT:     Head: Normocephalic and atraumatic.     Right Ear: External ear normal.     Left Ear: External ear normal.     Nose: Nose normal.     Mouth/Throat:     Pharynx: No oropharyngeal exudate.  Eyes:     General: No scleral icterus.    Conjunctiva/sclera: Conjunctivae normal.     Pupils: Pupils are equal, round, and reactive to light.  Neck:     Musculoskeletal: Normal range of motion and neck supple.     Thyroid: No thyromegaly.     Vascular: No JVD.  Cardiovascular:     Rate and Rhythm: Normal rate and regular rhythm.     Heart sounds: Normal heart sounds. No murmur. No friction rub. No gallop.   Pulmonary:     Effort: Pulmonary effort is normal. No respiratory distress.     Breath sounds: Normal breath sounds. No wheezing or rales.  Chest:     Chest wall: No tenderness.  Abdominal:     General: Bowel sounds are normal. There is no distension.     Palpations: Abdomen is soft. There is no mass.     Tenderness: There is no abdominal tenderness. There is no  guarding or rebound.  Musculoskeletal: Normal range of motion.        General: No tenderness.  Lymphadenopathy:     Cervical: No cervical adenopathy.  Skin:    General: Skin is warm and dry.     Findings: No erythema or rash.  Neurological:     Mental Status: She is alert and oriented to person, place, and time.     Cranial Nerves: No cranial nerve deficit.     Motor: No abnormal muscle tone.     Coordination: Coordination normal.     Deep Tendon Reflexes: Reflexes are normal and symmetric. Reflexes normal.  Psychiatric:        Behavior: Behavior normal.        Thought Content: Thought content normal.        Judgment: Judgment normal.           Assessment & Plan:  Her HTN is stable. She is in sinus rhythm today. We will get fasting labs to check lipids, a thyroid panel, etc. Her GERD is controlled. We will order another DEXA.  Alysia Penna, MD

## 2019-09-02 DIAGNOSIS — N390 Urinary tract infection, site not specified: Secondary | ICD-10-CM | POA: Diagnosis not present

## 2019-09-02 DIAGNOSIS — E039 Hypothyroidism, unspecified: Secondary | ICD-10-CM | POA: Diagnosis not present

## 2019-09-02 DIAGNOSIS — Z6829 Body mass index (BMI) 29.0-29.9, adult: Secondary | ICD-10-CM | POA: Diagnosis not present

## 2019-09-02 DIAGNOSIS — I129 Hypertensive chronic kidney disease with stage 1 through stage 4 chronic kidney disease, or unspecified chronic kidney disease: Secondary | ICD-10-CM | POA: Diagnosis not present

## 2019-09-02 DIAGNOSIS — N183 Chronic kidney disease, stage 3 unspecified: Secondary | ICD-10-CM | POA: Diagnosis not present

## 2019-09-10 NOTE — Telephone Encounter (Signed)
Advised patient that she should be taking metoprolol succinate 50 mg daily. She confirmed that she has been taking it as prescribed.

## 2019-09-23 ENCOUNTER — Other Ambulatory Visit: Payer: Self-pay | Admitting: Family Medicine

## 2019-10-14 DIAGNOSIS — H524 Presbyopia: Secondary | ICD-10-CM | POA: Diagnosis not present

## 2019-10-14 DIAGNOSIS — H0100A Unspecified blepharitis right eye, upper and lower eyelids: Secondary | ICD-10-CM | POA: Diagnosis not present

## 2019-10-14 DIAGNOSIS — H401132 Primary open-angle glaucoma, bilateral, moderate stage: Secondary | ICD-10-CM | POA: Diagnosis not present

## 2019-10-14 DIAGNOSIS — H02052 Trichiasis without entropian right lower eyelid: Secondary | ICD-10-CM | POA: Diagnosis not present

## 2019-10-23 ENCOUNTER — Other Ambulatory Visit: Payer: Self-pay | Admitting: Cardiovascular Disease

## 2019-10-26 ENCOUNTER — Ambulatory Visit: Payer: Medicare HMO | Admitting: Cardiovascular Disease

## 2019-11-11 ENCOUNTER — Encounter: Payer: Self-pay | Admitting: Family Medicine

## 2019-11-12 NOTE — Telephone Encounter (Signed)
Yes I also strongly suggest she get the vaccine

## 2019-11-17 ENCOUNTER — Encounter: Payer: Self-pay | Admitting: Family Medicine

## 2019-11-17 DIAGNOSIS — M8589 Other specified disorders of bone density and structure, multiple sites: Secondary | ICD-10-CM | POA: Diagnosis not present

## 2019-11-17 DIAGNOSIS — Z1231 Encounter for screening mammogram for malignant neoplasm of breast: Secondary | ICD-10-CM | POA: Diagnosis not present

## 2019-11-17 DIAGNOSIS — M810 Age-related osteoporosis without current pathological fracture: Secondary | ICD-10-CM | POA: Diagnosis not present

## 2019-11-18 DIAGNOSIS — N183 Chronic kidney disease, stage 3 unspecified: Secondary | ICD-10-CM | POA: Diagnosis not present

## 2019-11-24 ENCOUNTER — Encounter: Payer: Self-pay | Admitting: Family Medicine

## 2019-11-27 ENCOUNTER — Encounter: Payer: Self-pay | Admitting: Family Medicine

## 2019-11-27 NOTE — Telephone Encounter (Signed)
See my notes on the fax from [facility]

## 2019-12-23 ENCOUNTER — Ambulatory Visit: Payer: Medicare HMO | Admitting: Physician Assistant

## 2019-12-23 ENCOUNTER — Encounter: Payer: Self-pay | Admitting: Physician Assistant

## 2019-12-23 ENCOUNTER — Telehealth: Payer: Self-pay

## 2019-12-23 ENCOUNTER — Other Ambulatory Visit: Payer: Self-pay

## 2019-12-23 VITALS — BP 156/84 | HR 61 | Temp 98.0°F | Ht 63.0 in | Wt 169.0 lb

## 2019-12-23 DIAGNOSIS — K625 Hemorrhage of anus and rectum: Secondary | ICD-10-CM

## 2019-12-23 DIAGNOSIS — K602 Anal fissure, unspecified: Secondary | ICD-10-CM | POA: Diagnosis not present

## 2019-12-23 DIAGNOSIS — Z800 Family history of malignant neoplasm of digestive organs: Secondary | ICD-10-CM

## 2019-12-23 DIAGNOSIS — Z01818 Encounter for other preprocedural examination: Secondary | ICD-10-CM | POA: Diagnosis not present

## 2019-12-23 DIAGNOSIS — Z7901 Long term (current) use of anticoagulants: Secondary | ICD-10-CM | POA: Diagnosis not present

## 2019-12-23 MED ORDER — AMBULATORY NON FORMULARY MEDICATION: 0 refills | Status: DC

## 2019-12-23 MED ORDER — SUPREP BOWEL PREP KIT 17.5-3.13-1.6 GM/177ML PO SOLN: 1.0000 | ORAL | 0 refills | Status: DC

## 2019-12-23 NOTE — Progress Notes (Signed)
Reviewed and agree with management plan. Colonoscopy by Dr. Sharlett Iles in 2011 was technically difficult due to a diaphragmatic hernia containing bowel.  CT AP in 2019 showed a large hiatal hernia, stable from the previous exam. Multiple loops of small bowel are noted within as well as the majority of the stomach and pancreas Colonoscopy indicated however it is expected to be techically difficult.   Pricilla Riffle. Fuller Plan, MD [facility]

## 2019-12-23 NOTE — Telephone Encounter (Signed)
[facility] Pre-operative Risk Assessment     Request for surgical clearance:     Endoscopy Procedure  What type of surgery is being performed?     Colonoscopy   When is this surgery scheduled?     01-25-20  What type of clearance is required ?   Pharmacy  Are there any medications that need to be held prior to surgery and how long? Xarelto x 2 days   Practice name and name of physician performing surgery?      [facility]  What is your office phone and fax number?      Phone- [phone]  [phone]  Anesthesia type (None, local, MAC, general) ?       MAC

## 2019-12-23 NOTE — Patient Instructions (Addendum)
If you are age 79 or older, your body mass index should be between 23-30. Your Body mass index is 29.94 kg/m. If this is out of the aforementioned range listed, please consider follow up with your Primary Care Provider.  If you are age 31 or younger, your body mass index should be between 19-25. Your Body mass index is 29.94 kg/m. If this is out of the aformentioned range listed, please consider follow up with your Primary Care Provider.   You have been scheduled for a colonoscopy. Please follow written instructions given to you at your visit today.  Please pick up your prep supplies at the pharmacy within the next 1-3 days. If you use inhalers (even only as needed), please bring them with you on the day of your procedure.  You will be contacted by our office prior to your procedure for directions on holding your Xarelto.  If you do not hear from our office 1 week prior to your scheduled procedure, please call [phone] to discuss.    We have sent a prescription for nitroglycerin 0.125% gel to [facility]. You should apply a pea size amount to your rectum three times daily x 6-8 weeks.  [facility]'s information is below: Address: [address] [place], [place] [address]  [phone]  *Please DO NOT go directly from our office to pick up this medication! Give the pharmacy 1 day to process the prescription as this is compounded and takes time to make.  Due to recent changes in healthcare laws, you may see the results of your imaging and laboratory studies on MyChart before your provider has had a chance to review them.  We understand that in some cases there may be results that are confusing or concerning to you. Not all laboratory results come back in the same time frame and the provider may be waiting for multiple results in order to interpret others.  Please give [place] 48 hours in order for your provider to thoroughly review all the results before contacting the  office for clarification of your results.   Thank you, Anderson [place]

## 2019-12-23 NOTE — Progress Notes (Signed)
Chief Complaint: Bleeding hemorrhoids  HPI:    Christina Jacobs is a 79 year old female with a past medical history as listed below including CKD, AFIB chronic anticoagulation with Xarelto (12/17/2017 echo with LVEF 55-60%), known to Christina Jacobs, who was referred to me by Christina Morale, MD for a complaint of bleeding hemorrhoid.    09/27/2010 colonoscopy with severe diverticulosis in the sigmoid to descending colon and no active bleeding or blood in the colon.  Repeat recommended in 5 years.  It was noted this was a difficult colonoscopy as patient had a hernia and some of her bowels looping into the hernia.    09/26/2018 patient seen in clinic by me for fecal incontinence.  That time discussed constipation with overflow incontinence.  Discussed MiraLAX with titration as well as align.  Large hiatal hernia was reviewed, previously surgery had declined to fix this.  Explained that if she had no improvement would recommend she have a colonoscopy and then anorectal manometry.    Today, the patient tells me that her bowel movements have been regulated mostly through her diet.  She stopped using MiraLAX or anything else.  Does tell me today though that she had bright red blood per rectum in the toilet which started 12/15/2019 and continued at least once daily for the past week, she has not had any in the past 24 hours, tells me that this was mostly on the toilet paper recently and was typically with a BM. Describes feeling hemorrhoids and "like there is a scratch on the inside".  Describes her history of fissure in the past.    Patient tells me today that she would like to have another colonoscopy given all that has gone on if this is possible.    Denies fever, chills, weight loss or symptoms that awaken her from sleep.  Past Medical History:  Diagnosis Date  . Adenomatous colon polyp   . Anal fissure   . Asthma    Christina Jacobs  . Bowel obstruction ([place])   . Bursitis   . CKD (chronic kidney disease) stage 3,  GFR 30-59 ml/min    sees Dr. Joanne Jacobs   . Complication of anesthesia    post op N/V  . Diverticulosis   . Glaucoma   . Hiatal hernia    sees Dr. Ranjan Saint Jacobs at [facility]   . Hyperlipidemia   . Hypertension   . Hypothyroidism   . Myasthenia gravis ([place]) 2013   followed at [facility] Dr Christina Jacobs, diplopia only  . Osteoarthritis    see's Christina Jacobs  . Osteoporosis    last DEXA 08-03-11    Past Surgical History:  Procedure Laterality Date  . ABDOMINAL HYSTERECTOMY    . APPENDECTOMY    . BREAST LUMPECTOMY Bilateral     several, enign breast lumps removed  . CARDIOVERSION N/A 12/31/2017   Procedure: CARDIOVERSION;  Surgeon: Christina Hector, MD;  Location: [facility] ENDOSCOPY;  Service: Cardiovascular;  Laterality: N/A;  . CARDIOVERSION N/A 02/11/2018   Procedure: CARDIOVERSION;  Surgeon: Christina Hector, MD;  Location: [facility] ENDOSCOPY;  Service: Cardiovascular;  Laterality: N/A;  . COLONOSCOPY  09/27/2010   per Dr. Sharlett Jacobs, benign polyp, no repeats needed   . HERNIA REPAIR  10-18-10   Christina Jacobs attempted but could reduce this, the entire stomach is above the diaphragm  . THYROIDECTOMY, PARTIAL  1999  . TONSILLECTOMY      Current Outpatient Medications  Medication Sig Dispense Refill  . albuterol (PROVENTIL HFA;VENTOLIN  HFA) 108 (90 Base) MCG/ACT inhaler Inhale 2 puffs into the lungs every 6 (six) hours as needed for wheezing or shortness of breath.    Marland Kitchen atorvastatin (LIPITOR) 10 MG tablet TAKE 1 TABLET BY MOUTH EVERY DAY 90 tablet 3  . flecainide (TAMBOCOR) 50 MG tablet Take 1 tablet (50 mg total) by mouth 2 (two) times daily. 180 tablet 3  . levothyroxine (SYNTHROID) 100 MCG tablet TAKE 1 TABLET BY MOUTH EVERY DAY 90 tablet 3  . Melatonin 5 MG CAPS Take 5 mg by mouth at bedtime as needed (for sleep).    . metoprolol succinate (TOPROL-XL) 50 MG 24 hr tablet TAKE 100 MG IN THE MORNING AND TAKE 50 MG IN THE EVENING. TAKE WITH OR IMMEDIATELY FOLLOWING A MEAL. 270 tablet 0  .  multivitamin-iron-minerals-folic acid (CENTRUM) chewable tablet Chew 1 tablet by mouth daily.    Marland Kitchen omeprazole (PRILOSEC) 40 MG capsule Take 1 capsule (40 mg total) by mouth daily as needed (reflux). 30 capsule 11  . predniSONE (DELTASONE) 1 MG tablet Take 4 mg by mouth daily.    . psyllium (METAMUCIL) 58.6 % powder Take 1 packet by mouth daily as needed (for constipation).     . Rivaroxaban (XARELTO) 15 MG TABS tablet Take 1 tablet (15 mg total) by mouth daily with supper. 90 tablet 3  . Travoprost, BAK Free, (TRAVATAN) 0.004 % SOLN ophthalmic solution Place 1 drop into both eyes at bedtime.    . triamterene-hydrochlorothiazide (DYAZIDE) 37.5-25 MG capsule TAKE 1 EACH (1 CAPSULE TOTAL) BY MOUTH DAILY. 90 capsule 3   No current facility-administered medications for this visit.    Allergies as of 12/23/2019 - Review Complete 12/23/2019  Allergen Reaction Noted  . Codeine Hives 07/31/2010  . Eliquis [apixaban] Hives 12/19/2017  . Propoxyphene hcl Hives   . Meperidine hcl Rash   . Tape Rash and Other (See Comments) 02/04/2018    Family History  Problem Relation Age of Onset  . Breast cancer Maternal Grandmother   . Diabetes Father   . Hyperlipidemia Father   . Hypertension Father   . Cancer Father        origin unknown  . Kidney cancer Mother   . Clotting disorder Brother   . Cancer Sister        2015, anal ca    Social History   Socioeconomic History  . Marital status: Married    Spouse name: Not on file  . Number of children: 3  . Years of education: Not on file  . Highest education level: Not on file  Occupational History  . Occupation: retired    Comment: [employer]  Tobacco Use  . Smoking status: Never Smoker  . Smokeless tobacco: Never Used  Substance and Sexual Activity  . Alcohol use: No    Alcohol/week: 0.0 standard drinks  . Drug use: No  . Sexual activity: Not on file  Other Topics Concern  . Not on file  Social History Narrative   Married      Social  Determinants of Health   Financial Resource Strain:   . Difficulty of Paying Living Expenses: Not on file  Food Insecurity:   . Worried About Charity fundraiser in the Last Year: Not on file  . Ran Out of Food in the Last Year: Not on file  Transportation Needs:   . Lack of Transportation (Medical): Not on file  . Lack of Transportation (Non-Medical): Not on file  Physical Activity:   .  Days of Exercise per Week: Not on file  . Minutes of Exercise per Session: Not on file  Stress:   . Feeling of Stress : Not on file  Social Connections:   . Frequency of Communication with Friends and Family: Not on file  . Frequency of Social Gatherings with Friends and Family: Not on file  . Attends Religious Services: Not on file  . Active Member of Clubs or Organizations: Not on file  . Attends Archivist Meetings: Not on file  . Marital Status: Not on file  Intimate Partner Violence:   . Fear of Current or Ex-Partner: Not on file  . Emotionally Abused: Not on file  . Physically Abused: Not on file  . Sexually Abused: Not on file    Review of Systems:    Constitutional: No weight loss, fever or chills Cardiovascular: No chest pain  Respiratory: No SOB Gastrointestinal: See HPI and otherwise negative   Physical Exam:  Vital signs: BP (!) 156/84   Pulse 61   Temp 98 F (36.7 C)   Ht 5\' 3"  (1.6 m)   Wt 169 lb (76.7 kg)   SpO2 95%   BMI 29.94 kg/m   Constitutional:   Pleasant Elderly Caucasian female appears to be in NAD, Well developed, Well nourished, alert and cooperative Head:  Normocephalic and atraumatic. Respiratory: Respirations even and unlabored. Lungs clear to auscultation bilaterally.   No wheezes, crackles, or rhonchi.  Cardiovascular: Normal S1, S2. No MRG. Regular rate and rhythm. No peripheral edema, cyanosis or pallor.  Gastrointestinal:  Soft, nondistended, nontender. No rebound or guarding. Normal bowel sounds. No appreciable masses or  hepatomegaly. Rectal:  External: Posterior fissure, mild ttp Psychiatric: Demonstrates good judgement and reason without abnormal affect or behaviors.  No recent labs or imaging.  Assessment: 1.  Rectal bleeding: Over the past week with some pain, fissure on exam 2.  Family history of colon cancer: Son diagnosed 3 weeks ago 3.  Anal fissure: Seen at time of exam today, likely source of pain and recent bleeding, though cannot rule out colorectal cancer, last colonoscopy in 2011, son also recently diagnosed with colon cancer  Jacobs: 1.  Prescribed Nitroglycerin ointment 0.125% to be applied 3 times daily to the rectum for the next 6 to 8 weeks. 2.  Scheduled the patient for a colonoscopy in the [place] with Christina Jacobs.  Did discuss that this may be incomplete due to her history of a large hiatal hernia and difficulty during her last colonoscopy, but we will try to get as far as we can.  Patient is aware.  Discussed risks, benefits, limitations and alternatives and the patient agrees to proceed. 3.  Patient will be Covid tested 2 days prior to time of procedure. 4.  Patient is on Xarelto, recommend that she hold this for 2 days prior to time of procedure.  We will contact her prescribing physician to ensure this is acceptable for her. 5.  Patient to follow in clinic per recommendations from Christina Jacobs after time of procedure.  Ellouise Newer, PA-C [facility] 12/23/2019, 10:55 AM  Cc: Christina Morale, MD

## 2019-12-24 NOTE — Telephone Encounter (Signed)
Patient with diagnosis of afib on Xarelto for anticoagulation.    Procedure: Colonoscopy     Date of procedure: 01/25/2020  CHADS2-VASc score of  4 (HTN, AGE, AGE, female)  CrCl 30 ml/min  Per office protocol, patient can hold Xarelto for 2 days prior to procedure.

## 2019-12-24 NOTE — Telephone Encounter (Signed)
   Primary Cardiologist: Jenkins Rouge, MD  Chart reviewed as part of pre-operative protocol coverage. Request received to hold xarelto, not medical clearance.   Per our clinical pharmacist: Patient with diagnosis of afib on Xarelto for anticoagulation.    Procedure: Colonoscopy Date of procedure: 01/25/2020  CHADS2-VASc score of  4 (HTN, AGE, AGE, female)  CrCl 30 ml/min  Per office protocol, patient can hold Xarelto for 2 days prior to procedure   I will route this recommendation to the requesting party via [facility] fax function and remove from pre-op pool.  Please call with questions.  Ledora Bottcher, PA 12/24/2019, 3:48 PM

## 2019-12-24 NOTE — Telephone Encounter (Signed)
Pt has been made aware ok to hold Xarelto 2 days prior to her colonoscopy. Pt thanked me for the call. I will forward notes to [facility]. I will remove from the pre op call back pool.

## 2019-12-24 NOTE — Telephone Encounter (Signed)
Please comment on xarelto. 

## 2020-01-12 NOTE — Progress Notes (Signed)
Date:  01/26/2020   ID:  JENCIE CUADRAS, DOB Mar 16, 1941, MRN [id number]  PCP:  Laurey Morale, MD  Cardiologist:   Johnsie Cancel Electrophysiologist:  None   Evaluation Performed:  Follow-Up Visit  Chief Complaint:  PAF  History of Present Illness:    79 y.o. 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 Allergic reaction to eliquis now on Xarelto. Low dose due to CKD. Failed [place] and started on flecainide ETT normal 01/31/18 TTE 12/17/17 EF 55-60% otherwise normal . Successful DCC on AAT 01/31/18 June 2019 monitor showed 99% NSR. Has Myasthenia ? Beta blocker making lid lag worse and dose decreased to 50 mg am March 2020  The patient  does not have symptoms concerning for COVID-19 infection (fever, chills, cough, or new shortness of breath).   Colonoscopy 01/25/20 held xarelto 2 days before with no issues    She sees [name] for her kidneys Confusion about meds and seemed to indicate beta blocker was making her kidneys worse. I told her this was not the issue and if she is worried about Cr level would stop diuretic first. She is on lower dose Toprol as higher makes her myasthenia worse. Alternative if diuretic needs to be stopped could be norvasc or hydralazine    Past Medical History:  Diagnosis Date  . Adenomatous colon polyp   . Anal fissure   . Asthma    Dr. Halford Chessman  . Bowel obstruction ([place])   . Bursitis   . CKD (chronic kidney disease) stage 3, GFR 30-59 ml/min    sees Dr. [name]   . Complication of anesthesia    post op N/V  . Diverticulosis   . Glaucoma   . Hiatal hernia    sees Dr. [name] at [facility]   . Hyperlipidemia   . Hypertension   . Hypothyroidism   . Myasthenia gravis ([place]) 2013   followed at [facility] Dr Scheryl Marten, diplopia only  . Osteoarthritis    see's Dr. [name]  . Osteoporosis    last DEXA 08-03-11   Past Surgical History:  Procedure Laterality Date  . ABDOMINAL HYSTERECTOMY    . APPENDECTOMY    . BREAST LUMPECTOMY Bilateral     several, enign  breast lumps removed  . CARDIOVERSION N/A 12/31/2017   Procedure: CARDIOVERSION;  Surgeon: Josue Hector, MD;  Location: [facility] ENDOSCOPY;  Service: Cardiovascular;  Laterality: N/A;  . CARDIOVERSION N/A 02/11/2018   Procedure: CARDIOVERSION;  Surgeon: Josue Hector, MD;  Location: [facility] ENDOSCOPY;  Service: Cardiovascular;  Laterality: N/A;  . COLONOSCOPY  09/27/2010   per Dr. Sharlett Iles, benign polyp, no repeats needed   . HERNIA REPAIR  10-18-10   Dr. Hassell Done attempted but could reduce this, the entire stomach is above the diaphragm  . THYROIDECTOMY, PARTIAL  1999  . TONSILLECTOMY       Current Meds  Medication Sig  . albuterol (PROVENTIL HFA;VENTOLIN HFA) 108 (90 Base) MCG/ACT inhaler Inhale 2 puffs into the lungs every 6 (six) hours as needed for wheezing or shortness of breath.  . AMBULATORY NON FORMULARY MEDICATION Nitroglycerine ointment 0.125 %  Apply a pea sized amount internally three times daily for 6 - 8 weeks  . atorvastatin (LIPITOR) 10 MG tablet TAKE 1 TABLET BY MOUTH EVERY DAY  . flecainide (TAMBOCOR) 50 MG tablet Take 1 tablet (50 mg total) by mouth 2 (two) times daily.  . Latanoprost 0.005 % EMUL Apply to eye daily.  Marland Kitchen levothyroxine (SYNTHROID) 100 MCG tablet TAKE 1  TABLET BY MOUTH EVERY DAY  . Melatonin 5 MG CAPS Take 5 mg by mouth at bedtime as needed (for sleep).  . metoprolol succinate (TOPROL-XL) 50 MG 24 hr tablet TAKE 100 MG IN THE MORNING AND TAKE 50 MG IN THE EVENING. TAKE WITH OR IMMEDIATELY FOLLOWING A MEAL.  . multivitamin-iron-minerals-folic acid (CENTRUM) chewable tablet Chew 1 tablet by mouth daily.  Marland Kitchen omeprazole (PRILOSEC) 40 MG capsule Take 1 capsule (40 mg total) by mouth daily as needed (reflux).  . predniSONE (DELTASONE) 1 MG tablet Take 4 mg by mouth daily.  . psyllium (METAMUCIL) 58.6 % powder Take 1 packet by mouth daily as needed (for constipation).   . Rivaroxaban (XARELTO) 15 MG TABS tablet Take 1 tablet (15 mg total) by mouth daily with supper.  .  timolol (BETIMOL) 0.5 % ophthalmic solution Place 1 drop into both eyes 2 (two) times daily.  Marland Kitchen triamterene-hydrochlorothiazide (DYAZIDE) 37.5-25 MG capsule TAKE 1 EACH (1 CAPSULE TOTAL) BY MOUTH DAILY.     Allergies:   Codeine, Eliquis [apixaban], Propoxyphene hcl, Meperidine hcl, and Tape   Social History   Tobacco Use  . Smoking status: Never Smoker  . Smokeless tobacco: Never Used  Substance Use Topics  . Alcohol use: No    Alcohol/week: 0.0 standard drinks  . Drug use: No     Family Hx: The patient's family history includes Breast cancer in her maternal grandmother; Cancer in her father and sister; Clotting disorder in her brother; Colon cancer (age of onset: 56) in her son; Diabetes in her father; Hyperlipidemia in her father; Hypertension in her father; Kidney cancer in her mother.  ROS:   Please see the history of present illness.     All other systems reviewed and are negative.   Prior CV studies:   The following studies were reviewed today:  Echo 12/17/17 Holter: 12/17/17 ETT 01/31/18  Labs/Other Tests and Data Reviewed:    EKG:  SR rate 53 nonspecific ST changes QT normal 01/07/19  Recent Labs: 08/19/2019: ALT 18; BUN 24; Creatinine, Ser 1.51; Hemoglobin 14.9; Platelets 178.0; Potassium 4.2; Sodium 140; TSH 0.28   Recent Lipid Panel Lab Results  Component Value Date/Time   CHOL 177 08/19/2019 10:48 AM   TRIG 146.0 08/19/2019 10:48 AM   HDL 68.90 08/19/2019 10:48 AM   CHOLHDL 3 08/19/2019 10:48 AM   LDLCALC 79 08/19/2019 10:48 AM   LDLDIRECT 142.2 07/27/2013 10:07 AM    Wt Readings from Last 3 Encounters:  01/26/20 169 lb (76.7 kg)  01/25/20 169 lb (76.7 kg)  12/23/19 169 lb (76.7 kg)     Objective:    Vital Signs:  BP (!) 130/92   Pulse 65   Ht 5\' 3"  (1.6 m)   Wt 169 lb (76.7 kg)   SpO2 97%   BMI 29.94 kg/m    Affect appropriate Healthy:  appears stated age HEENT: normal Neck supple with no adenopathy JVP normal no bruits no  thyromegaly Lungs clear with no wheezing and good diaphragmatic motion Heart:  S1/S2 no murmur, no rub, gallop or click PMI normal Abdomen: benighn, BS positve, no tenderness, no AAA no bruit.  No HSM or HJR Distal pulses intact with no bruits No edema Neuro non-focal Skin warm and dry No muscular weakness   ASSESSMENT & PLAN:    1 PAF: post successful DCC on flecainide 02/11/18 anticoagulation with xarelto stable   Monitor 04/28/18 with average HR 56 bpm and NSr 99% of time on higher dose of flecainide  75 mg bid Beta blocker reduced post conversion   2. Chadsvasc score of at least 4  She has had CrCl less than 50 so has been placed on 15 mg dose of Xarelto allergic to eliquis   3. CRF:  F/U [place] Beta blocker is not the issue consider d/c diuretic   4. Thyroid:  TSH normal   5. HLD:  On statin LDL 79 adequate normal LFTs 08/19/19   6. GI:  Large hiatal hernia, overflow incontinence diverticulosis f/u [facility] Had [facility] and she declined surgeyr   7. Myesthenia:  Thinks beta blockers may be making lid lag worse. Decrease dose decreased  F/u [facility]   8. HtN:  Her home readings seem fine If renal wants to stop diuretic would consider adding norvasc or hydralazine   COVID-19 Education: The signs and symptoms of COVID-19 were discussed with the patient and how to seek care for testing (follow up with PCP or arrange E-visit).  The importance of social distancing was discussed today.   Medication Adjustments/Labs and Tests Ordered: Current medicines are reviewed at length with the patient today.  Concerns regarding medicines are outlined above.   Tests Ordered:  None   Medication Changes:  None   Disposition:  Follow up in a year  Signed, Jenkins Rouge, MD  01/26/2020 10:54 AM    [place]

## 2020-01-18 ENCOUNTER — Encounter: Payer: Self-pay | Admitting: Gastroenterology

## 2020-01-19 ENCOUNTER — Telehealth: Payer: Self-pay | Admitting: Physician Assistant

## 2020-01-19 NOTE — Telephone Encounter (Signed)
SW pt this afternoon, informed her to hold her Xarelto 2 days prior to procedure. Pt voiced understanding.

## 2020-01-19 NOTE — Telephone Encounter (Signed)
Tried to return pt's call. No ans, phone just rings. Pt was made aware back on Dec 24, 2019 by Cardiology that it was ok to hold Xarelto. X 2 days. I will try to give pt a call back this afternoon before I leave.

## 2020-01-21 ENCOUNTER — Other Ambulatory Visit: Payer: Self-pay | Admitting: Gastroenterology

## 2020-01-21 ENCOUNTER — Ambulatory Visit (INDEPENDENT_AMBULATORY_CARE_PROVIDER_SITE_OTHER): Payer: Medicare HMO

## 2020-01-21 DIAGNOSIS — Z1159 Encounter for screening for other viral diseases: Secondary | ICD-10-CM

## 2020-01-22 LAB — SARS CORONAVIRUS 2 (TAT 6-24 HRS): SARS Coronavirus 2: NEGATIVE

## 2020-01-25 ENCOUNTER — Other Ambulatory Visit: Payer: Self-pay

## 2020-01-25 ENCOUNTER — Ambulatory Visit (AMBULATORY_SURGERY_CENTER): Payer: Medicare HMO | Admitting: Gastroenterology

## 2020-01-25 ENCOUNTER — Telehealth: Payer: Self-pay

## 2020-01-25 ENCOUNTER — Encounter: Payer: Self-pay | Admitting: Gastroenterology

## 2020-01-25 VITALS — BP 130/60 | HR 62 | Temp 97.3°F | Resp 17 | Ht 63.0 in | Wt 169.0 lb

## 2020-01-25 DIAGNOSIS — K5730 Diverticulosis of large intestine without perforation or abscess without bleeding: Secondary | ICD-10-CM

## 2020-01-25 DIAGNOSIS — K921 Melena: Secondary | ICD-10-CM

## 2020-01-25 DIAGNOSIS — K625 Hemorrhage of anus and rectum: Secondary | ICD-10-CM

## 2020-01-25 DIAGNOSIS — D125 Benign neoplasm of sigmoid colon: Secondary | ICD-10-CM | POA: Diagnosis not present

## 2020-01-25 DIAGNOSIS — D123 Benign neoplasm of transverse colon: Secondary | ICD-10-CM

## 2020-01-25 DIAGNOSIS — Z800 Family history of malignant neoplasm of digestive organs: Secondary | ICD-10-CM

## 2020-01-25 DIAGNOSIS — K640 First degree hemorrhoids: Secondary | ICD-10-CM | POA: Diagnosis not present

## 2020-01-25 DIAGNOSIS — D124 Benign neoplasm of descending colon: Secondary | ICD-10-CM

## 2020-01-25 DIAGNOSIS — K602 Anal fissure, unspecified: Secondary | ICD-10-CM

## 2020-01-25 MED ORDER — SODIUM CHLORIDE 0.9 % IV SOLN: 500.0000 mL | Freq: Once | INTRAVENOUS | Status: DC

## 2020-01-25 NOTE — Op Note (Addendum)
[place] Patient Name: Christina Jacobs Procedure Date: 01/25/2020 1:24 PM MRN: [id number] Endoscopist: Ladene Artist , MD Age: 79 Referring MD:  Date of Birth: 15-Aug-1941 Gender: Female Account #: [id number] Procedure:                Colonoscopy Indications:              Hematochezia, Family history of colon cancer, first                            degree age < 51. Medicines:                Monitored Anesthesia Care Procedure:                Pre-Anesthesia Assessment:                           - Prior to the procedure, a History and Physical                            was performed, and patient medications and                            allergies were reviewed. The patient's tolerance of                            previous anesthesia was also reviewed. The risks                            and benefits of the procedure and the sedation                            options and risks were discussed with the patient.                            All questions were answered, and informed consent                            was obtained. Prior Anticoagulants: The patient has                            taken Xarelto (rivaroxaban), last dose was 2 days                            prior to procedure. ASA Grade Assessment: III - A                            patient with severe systemic disease. After                            reviewing the risks and benefits, the patient was                            deemed in satisfactory condition to undergo the  procedure.                           After obtaining informed consent, the colonoscope                            was passed under direct vision. Throughout the                            procedure, the patient's blood pressure, pulse, and                            oxygen saturations were monitored continuously. The                            Colonoscope was introduced through the anus with       the intention of advancing to the cecum. The scope                            was advanced to the transverse colon before the                            procedure was aborted. Medications were not given.                            The rectum was photographed. The quality of the                            bowel preparation was good. The patient tolerated                            the procedure well. The colonoscopy was technically                            difficult and complex due to abnormal anatomy, a                            redundant colon and significant looping. Scope In: 1:31:31 PM Scope Out: 2:01:54 PM Scope Withdrawal Time: 0 hours 18 minutes 44 seconds  Total Procedure Duration: 0 hours 30 minutes 23 seconds  Findings:                 The perianal and digital rectal examinations were                            normal.                           Four sessile polyps were found in the sigmoid colon                            (1), descending colon (1) and transverse colon (2).                            The  polyps were 6 to 9 mm in size. These polyps                            were removed with a cold snare. Resection and                            retrieval were complete.                           Two pedunculated and sessile polyps were found in                            the sigmoid colon and transverse colon. The polyps                            were 8 to 10 mm in size. These polyps were removed                            with a hot snare. Resection and retrieval were                            complete.                           Internal hemorrhoids were found during                            retroflexion. The hemorrhoids were small and Grade                            I (internal hemorrhoids that do not prolapse).                           Multiple small-mouthed diverticula were found in                            the left colon. There was narrowing of the colon in                             association with the diverticular opening. There                            was evidence of diverticular spasm. There was no                            evidence of diverticular bleeding.                           The exam was otherwise without abnormality on                            direct and retroflexion views. Incomplete procedure  to transverse colon. Complications:            No immediate complications. Estimated blood loss:                            None. Estimated Blood Loss:     Estimated blood loss: none. Impression:               - Four 6 to 9 mm polyps in the sigmoid colon, in                            the descending colon and in the transverse colon,                            removed with a cold snare. Resected and retrieved.                           - Two 8 to 10 mm polyps in the sigmoid colon and in                            the transverse colon, removed with a hot snare.                            Resected and retrieved.                           - Internal hemorrhoids.                           - Moderate diverticulosis in the left colon.                           - The examination was otherwise normal on direct                            and retroflexion views. Recommendation:           - Consider repeat colonoscopy, ACBE or colonoscopy                            at a tertiary center after studies are complete for                            surveillance based on pathology results.                           - Resume Xarelto (rivaroxaban) in 2 days at prior                            dose. Refer to managing physician for further                            adjustment of therapy.                           - Patient  has a contact number available for                            emergencies. The signs and symptoms of potential                            delayed complications were discussed with the                             patient. Return to normal activities tomorrow.                            Written discharge instructions were provided to the                            patient.                           - Resume previous diet.                           - Continue present medications.                           - Await pathology results.                           - No aspirin, ibuprofen, naproxen, or other                            non-steroidal anti-inflammatory drugs for 2 weeks                            after polyp removal. Ladene Artist, MD 01/25/2020 2:11:37 PM This report has been signed electronically.

## 2020-01-25 NOTE — Telephone Encounter (Signed)
Pt contacted on-call doctor about prep concerns. Called patient to answer prep questions. Patient stated that after taking the first dose she was having uncontrollable bowel movements and "did not feel she should take second dose". Reassured patient that she needed to take second dose for procedure, and advise her to bring extra underwear to appt, and to use towels in car while in route to office for procedure. Patient verbalized understanding, and will call office back if she has any more concerns.

## 2020-01-25 NOTE — Progress Notes (Signed)
Pt. Waited in RR prior to D/C to ask Dr. Fuller Plan another question, hence disparity in D/C time.

## 2020-01-25 NOTE — Progress Notes (Signed)
Called to room to assist during endoscopic procedure.  Patient ID and intended procedure confirmed with present staff. Received instructions for my participation in the procedure from the performing physician.  

## 2020-01-25 NOTE — Progress Notes (Signed)
[facility] Temp-LC  Pt's states no medical or surgical changes since previsit or office visit.

## 2020-01-25 NOTE — Progress Notes (Signed)
A and O x3. Report to RN. Tolerated MAC anesthesia well.

## 2020-01-25 NOTE — Patient Instructions (Signed)
Impression/Recommendations:  Polyp handout given to patient. Diverticulosis handout given to patient. Hemorrhoid handout given to patient.   Resume Xarelto in 2 days at prior dose.  Refer to managing physician for further adjustment of therapy. Resume previous diet. Continue present medications. Await pathology results.  No aspirin, ibuprofen, naproxen, or other NSAID drugs for 2 weeks.  Tylenol only.  YOU HAD AN ENDOSCOPIC PROCEDURE TODAY AT [facility]:   Refer to the procedure report that was given to you for any specific questions about what was found during the examination.  If the procedure report does not answer your questions, please call your gastroenterologist to clarify.  If you requested that your care partner not be given the details of your procedure findings, then the procedure report has been included in a sealed envelope for you to review at your convenience later.  YOU SHOULD EXPECT: Some feelings of bloating in the abdomen. Passage of more gas than usual.  Walking can help get rid of the air that was put into your GI tract during the procedure and reduce the bloating. If you had a lower endoscopy (such as a colonoscopy or flexible sigmoidoscopy) you may notice spotting of blood in your stool or on the toilet paper. If you underwent a bowel prep for your procedure, you may not have a normal bowel movement for a few days.  Please Note:  You might notice some irritation and congestion in your nose or some drainage.  This is from the oxygen used during your procedure.  There is no need for concern and it should clear up in a day or so.  SYMPTOMS TO REPORT IMMEDIATELY:   Following lower endoscopy (colonoscopy or flexible sigmoidoscopy):  Excessive amounts of blood in the stool  Significant tenderness or worsening of abdominal pains  Swelling of the abdomen that is new, acute  Fever of 100F or higher For urgent or emergent issues, a gastroenterologist can be  reached at any hour by calling [phone]. Do not use MyChart messaging for urgent concerns.    DIET:  We do recommend a small meal at first, but then you may proceed to your regular diet.  Drink plenty of fluids but you should avoid alcoholic beverages for 24 hours.  ACTIVITY:  You should plan to take it easy for the rest of today and you should NOT DRIVE or use heavy machinery until tomorrow (because of the sedation medicines used during the test).    FOLLOW UP: Our staff will call the number listed on your records 48-72 hours following your procedure to check on you and address any questions or concerns that you may have regarding the information given to you following your procedure. If we do not reach you, we will leave a message.  We will attempt to reach you two times.  During this call, we will ask if you have developed any symptoms of COVID 19. If you develop any symptoms (ie: fever, flu-like symptoms, shortness of breath, cough etc.) before then, please call [phone].  If you test positive for Covid 19 in the 2 weeks post procedure, please call and report this information to [place].    If any biopsies were taken you will be contacted by phone or by letter within the next 1-3 weeks.  Please call us at [phone] if you have not heard about the biopsies in 3 weeks.    SIGNATURES/CONFIDENTIALITY: You and/or your care partner have signed paperwork which will be entered into your  that the information above on your After Visit Summary has been reviewed and is understood.  Full responsibility of the confidentiality of this discharge information lies with you and/or your care-partner.  

## 2020-01-26 ENCOUNTER — Encounter: Payer: Self-pay | Admitting: Cardiovascular Disease

## 2020-01-26 ENCOUNTER — Ambulatory Visit: Payer: Medicare HMO | Admitting: Cardiovascular Disease

## 2020-01-26 VITALS — BP 130/92 | HR 65 | Ht 63.0 in | Wt 169.0 lb

## 2020-01-26 DIAGNOSIS — E785 Hyperlipidemia, unspecified: Secondary | ICD-10-CM

## 2020-01-26 DIAGNOSIS — I480 Paroxysmal atrial fibrillation: Secondary | ICD-10-CM

## 2020-01-26 NOTE — Patient Instructions (Addendum)

## 2020-01-27 ENCOUNTER — Telehealth: Payer: Self-pay | Admitting: *Deleted

## 2020-01-27 NOTE — Telephone Encounter (Signed)
  Follow up Call-  Call back number 01/25/2020  Post procedure Call Back phone  # [phone]  Permission to leave phone message Yes  Some recent data might be hidden     Patient questions:  Do you have a fever, pain , or abdominal swelling? No. Pain Score  0 *  Have you tolerated food without any problems? Yes.    Have you been able to return to your normal activities? Yes.    Do you have any questions about your discharge instructions: Diet   No. Medications  No. Follow up visit  No.  Do you have questions or concerns about your Care? No.  Actions: * If pain score is 4 or above: No action needed, pain <4.  1. Have you developed a fever since your procedure? no  2.   Have you had an respiratory symptoms (SOB or cough) since your procedure? no  3.   Have you tested positive for COVID 19 since your procedure no  4.   Have you had any family members/close contacts diagnosed with the COVID 19 since your procedure?  no   If yes to any of these questions please route to Joylene John, RN and Erenest Rasher, RN

## 2020-02-04 ENCOUNTER — Other Ambulatory Visit: Payer: Self-pay

## 2020-02-04 DIAGNOSIS — Z1211 Encounter for screening for malignant neoplasm of colon: Secondary | ICD-10-CM

## 2020-02-11 ENCOUNTER — Telehealth: Payer: Self-pay | Admitting: Gastroenterology

## 2020-02-11 NOTE — Telephone Encounter (Signed)
All of her questions about BE answered .  She will call back for any additional questions or concerns.

## 2020-02-11 NOTE — Telephone Encounter (Signed)
Pt has questions regarding barium enema and requested a call back.

## 2020-02-25 ENCOUNTER — Ambulatory Visit (HOSPITAL_COMMUNITY): Payer: Medicare HMO

## 2020-05-12 ENCOUNTER — Other Ambulatory Visit: Payer: Self-pay | Admitting: Cardiovascular Disease

## 2020-05-12 NOTE — Telephone Encounter (Signed)
Prescription refill request for Xarelto received.   Last office visit: 01/26/2020, Christina Jacobs: 76.7 kg Age: 79 y.o. Scr: 1.51, 08/19/2019 CrCl: 36 ml/min   Prescription refill sent.

## 2020-06-12 ENCOUNTER — Other Ambulatory Visit: Payer: Self-pay | Admitting: Cardiovascular Disease

## 2020-07-22 NOTE — Progress Notes (Signed)
Date:  08/05/2020   ID:  Christina Jacobs, DOB [birth year], MRN [id number]  PCP:  Laurey Morale, MD  Cardiologist:   Johnsie Cancel Electrophysiologist:  None   Evaluation Performed:  Follow-Up Visit  Chief Complaint:  PAF  History of Present Illness:    79 y.o. 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 Allergic reaction to eliquis now on Xarelto. Low dose due to CKD. Failed [place] and started on flecainide ETT normal 01/31/18 TTE 12/17/17 EF 55-60% otherwise normal . Successful DCC on AAT 01/31/18 June 2019 monitor showed 99% NSR. Has Myasthenia ? Beta blocker making lid lag worse and dose decreased to 50 mg am March 2020  The patient  does not have symptoms concerning for COVID-19 infection (fever, chills, cough, or new shortness of breath).   Colonoscopy 01/25/20 held xarelto 2 days before with no issues    She sees [name] for her kidneys Confusion about meds and seemed to indicate beta blocker was making her kidneys worse. I told her this was not the issue and if she is worried about Cr level would stop diuretic first. She is on lower dose Toprol as higher makes her myasthenia worse. Alternative if diuretic needs to be stopped could be norvasc or hydralazine   She has no cardiac complaints    Past Medical History:  Diagnosis Date  . Adenomatous colon polyp   . Anal fissure   . Asthma    Dr. Halford Chessman  . Bowel obstruction ([place])   . Bursitis   . CKD (chronic kidney disease) stage 3, GFR 30-59 ml/min (HCC)    sees Dr. [name]   . Complication of anesthesia    post op N/V  . Diverticulosis   . Glaucoma   . Hiatal hernia    sees Dr. [name] at [facility]   . Hyperlipidemia   . Hypertension   . Hypothyroidism   . Myasthenia gravis ([place]) 2013   followed at [facility] Dr Scheryl Marten, diplopia only  . Osteoarthritis    see's Dr. [name]  . Osteoporosis    last DEXA 08-03-11   Past Surgical History:  Procedure Laterality Date  . ABDOMINAL HYSTERECTOMY    . APPENDECTOMY    . BREAST  LUMPECTOMY Bilateral     several, enign breast lumps removed  . CARDIOVERSION N/A 12/31/2017   Procedure: CARDIOVERSION;  Surgeon: Josue Hector, MD;  Location: [facility] ENDOSCOPY;  Service: Cardiovascular;  Laterality: N/A;  . CARDIOVERSION N/A 02/11/2018   Procedure: CARDIOVERSION;  Surgeon: Josue Hector, MD;  Location: [facility] ENDOSCOPY;  Service: Cardiovascular;  Laterality: N/A;  . COLONOSCOPY  09/27/2010   per Dr. Sharlett Iles, benign polyp, no repeats needed   . HERNIA REPAIR  10-18-10   Dr. Hassell Done attempted but could reduce this, the entire stomach is above the diaphragm  . THYROIDECTOMY, PARTIAL  1999  . TONSILLECTOMY       Current Meds  Medication Sig  . albuterol (PROVENTIL HFA;VENTOLIN HFA) 108 (90 Base) MCG/ACT inhaler Inhale 2 puffs into the lungs every 6 (six) hours as needed for wheezing or shortness of breath.  . AMBULATORY NON FORMULARY MEDICATION Nitroglycerine ointment 0.125 %  Apply a pea sized amount internally three times daily for 6 - 8 weeks  . atorvastatin (LIPITOR) 10 MG tablet TAKE 1 TABLET BY MOUTH EVERY DAY  . flecainide (TAMBOCOR) 50 MG tablet TAKE 1 TABLET BY MOUTH TWICE A DAY  . Latanoprost 0.005 % EMUL Apply to eye daily.  Marland Kitchen levothyroxine (SYNTHROID) 100  MCG tablet TAKE 1 TABLET BY MOUTH EVERY DAY  . Melatonin 5 MG CAPS Take 5 mg by mouth at bedtime as needed (for sleep).  . metoprolol succinate (TOPROL-XL) 50 MG 24 hr tablet TAKE 100 MG IN THE MORNING AND TAKE 50 MG IN THE EVENING. TAKE WITH OR IMMEDIATELY FOLLOWING A MEAL.  . multivitamin-iron-minerals-folic acid (CENTRUM) chewable tablet Chew 1 tablet by mouth daily.  Marland Kitchen omeprazole (PRILOSEC) 40 MG capsule Take 1 capsule (40 mg total) by mouth daily as needed (reflux).  . predniSONE (DELTASONE) 1 MG tablet Take 4 mg by mouth daily.  . psyllium (METAMUCIL) 58.6 % powder Take 1 packet by mouth daily as needed (for constipation).   . timolol (BETIMOL) 0.5 % ophthalmic solution Place 1 drop into both eyes 2 (two)  times daily.  Alveda Reasons 15 MG TABS tablet TAKE 1 TABLET (15 MG TOTAL) BY MOUTH DAILY WITH SUPPER.     Allergies:   Codeine, Eliquis [apixaban], Propoxyphene hcl, Meperidine hcl, and Tape   Social History   Tobacco Use  . Smoking status: Never Smoker  . Smokeless tobacco: Never Used  Vaping Use  . Vaping Use: Never used  Substance Use Topics  . Alcohol use: No    Alcohol/week: 0.0 standard drinks  . Drug use: No     Family Hx: The patient's family history includes Breast cancer in her maternal grandmother; Cancer in her father and sister; Clotting disorder in her brother; Colon cancer (age of onset: 89) in her son; Diabetes in her father; Hyperlipidemia in her father; Hypertension in her father; Kidney cancer in her mother.  ROS:   Please see the history of present illness.     All other systems reviewed and are negative.   Prior CV studies:   The following studies were reviewed today:  Echo 12/17/17 Holter: 12/17/17 ETT 01/31/18  Labs/Other Tests and Data Reviewed:    EKG:  SR rate 53 nonspecific ST changes QT normal 01/07/19  Recent Labs: 08/19/2019: ALT 18; BUN 24; Creatinine, Ser 1.51; Hemoglobin 14.9; Platelets 178.0; Potassium 4.2; Sodium 140; TSH 0.28   Recent Lipid Panel Lab Results  Component Value Date/Time   CHOL 177 08/19/2019 10:48 AM   TRIG 146.0 08/19/2019 10:48 AM   HDL 68.90 08/19/2019 10:48 AM   CHOLHDL 3 08/19/2019 10:48 AM   LDLCALC 79 08/19/2019 10:48 AM   LDLDIRECT 142.2 07/27/2013 10:07 AM    Wt Readings from Last 3 Encounters:  08/05/20 168 lb 12.8 oz (76.6 kg)  01/26/20 169 lb (76.7 kg)  01/25/20 169 lb (76.7 kg)     Objective:    Vital Signs:  BP 128/78   Pulse 63   Ht 5\' 3"  (1.6 m)   Wt 168 lb 12.8 oz (76.6 kg)   SpO2 96%   BMI 29.90 kg/m    Affect appropriate Healthy:  appears stated age HEENT: normal Neck supple with no adenopathy JVP normal no bruits no thyromegaly Lungs clear with no wheezing and good diaphragmatic  motion Heart:  S1/S2 no murmur, no rub, gallop or click PMI normal Abdomen: benighn, BS positve, no tenderness, no AAA no bruit.  No HSM or HJR Distal pulses intact with no bruits No edema Neuro non-focal Skin warm and dry No muscular weakness   ASSESSMENT & PLAN:    1 PAF: post successful DCC on flecainide 02/11/18 anticoagulation with xarelto stable   Monitor 04/28/18 with average HR 56 bpm and NSr 99% of time on higher dose of flecainide 75  mg bid Beta blocker reduced post conversion   2. Chadsvasc score of at least 4  She has had CrCl less than 50 so has been placed on 15 mg dose of Xarelto allergic to eliquis   3. CRF:  F/U [place] Beta blocker is not the issue consider d/c diuretic   4. Thyroid:  TSH normal   5. HLD:  On statin LDL 79 adequate normal LFTs 08/19/19   6. GI:  Large hiatal hernia, overflow incontinence diverticulosis f/u Fuller Plan Had consult at [facility] and she declined surgery   7. Myesthenia:  Thinks beta blockers may be making lid lag worse. Decrease dose decreased  F/u [facility]   8. HtN:  Her home readings seem fine If renal wants to stop diuretic would consider adding norvasc or hydralazine   COVID-19 Education: The signs and symptoms of COVID-19 were discussed with the patient and how to seek care for testing (follow up with PCP or arrange E-visit).  The importance of social distancing was discussed today.   Medication Adjustments/Labs and Tests Ordered: Current medicines are reviewed at length with the patient today.  Concerns regarding medicines are outlined above.   Tests Ordered:  None   Medication Changes:  None   Disposition:  Follow up in a year  Signed, Jenkins Rouge, MD  08/05/2020 2:53 PM    [place]

## 2020-08-05 ENCOUNTER — Other Ambulatory Visit: Payer: Self-pay

## 2020-08-05 ENCOUNTER — Ambulatory Visit (INDEPENDENT_AMBULATORY_CARE_PROVIDER_SITE_OTHER): Payer: Medicare HMO | Admitting: Cardiovascular Disease

## 2020-08-05 ENCOUNTER — Encounter: Payer: Self-pay | Admitting: Cardiovascular Disease

## 2020-08-05 VITALS — BP 128/78 | HR 63 | Ht 63.0 in | Wt 168.8 lb

## 2020-08-05 DIAGNOSIS — I480 Paroxysmal atrial fibrillation: Secondary | ICD-10-CM

## 2020-08-05 NOTE — Patient Instructions (Addendum)

## 2020-08-13 ENCOUNTER — Other Ambulatory Visit: Payer: Self-pay | Admitting: Family Medicine

## 2020-08-20 ENCOUNTER — Other Ambulatory Visit: Payer: Self-pay | Admitting: Family Medicine

## 2020-08-21 ENCOUNTER — Other Ambulatory Visit: Payer: Self-pay | Admitting: Family Medicine

## 2020-08-23 ENCOUNTER — Encounter: Payer: Self-pay | Admitting: Family Medicine

## 2020-08-23 ENCOUNTER — Ambulatory Visit (INDEPENDENT_AMBULATORY_CARE_PROVIDER_SITE_OTHER): Payer: Medicare HMO | Admitting: Family Medicine

## 2020-08-23 ENCOUNTER — Other Ambulatory Visit: Payer: Self-pay

## 2020-08-23 VITALS — BP 134/70 | HR 77 | Temp 97.9°F | Ht 63.0 in | Wt 167.6 lb

## 2020-08-23 DIAGNOSIS — I10 Essential (primary) hypertension: Secondary | ICD-10-CM | POA: Diagnosis not present

## 2020-08-23 DIAGNOSIS — E039 Hypothyroidism, unspecified: Secondary | ICD-10-CM | POA: Diagnosis not present

## 2020-08-23 DIAGNOSIS — R8290 Unspecified abnormal findings in urine: Secondary | ICD-10-CM

## 2020-08-23 DIAGNOSIS — J4530 Mild persistent asthma, uncomplicated: Secondary | ICD-10-CM

## 2020-08-23 DIAGNOSIS — E785 Hyperlipidemia, unspecified: Secondary | ICD-10-CM | POA: Diagnosis not present

## 2020-08-23 DIAGNOSIS — Z23 Encounter for immunization: Secondary | ICD-10-CM

## 2020-08-23 DIAGNOSIS — N183 Chronic kidney disease, stage 3 unspecified: Secondary | ICD-10-CM | POA: Diagnosis not present

## 2020-08-23 DIAGNOSIS — K219 Gastro-esophageal reflux disease without esophagitis: Secondary | ICD-10-CM

## 2020-08-23 DIAGNOSIS — G7000 Myasthenia gravis without (acute) exacerbation: Secondary | ICD-10-CM

## 2020-08-23 LAB — POCT URINALYSIS DIPSTICK
Bilirubin, UA: NEGATIVE
Blood, UA: NEGATIVE
Glucose, UA: NEGATIVE
Ketones, UA: NEGATIVE
Nitrite, UA: NEGATIVE
Protein, UA: POSITIVE — AB
Spec Grav, UA: 1.015 (ref 1.010–1.025)
Urobilinogen, UA: 0.2 E.U./dL
pH, UA: 7.5 (ref 5.0–8.0)

## 2020-08-23 MED ORDER — ALBUTEROL SULFATE HFA 108 (90 BASE) MCG/ACT IN AERS: 2.0000 | INHALATION_SPRAY | RESPIRATORY_TRACT | 5 refills | Status: DC | PRN

## 2020-08-23 MED ORDER — METOPROLOL SUCCINATE ER 50 MG PO TB24: 50.0000 mg | ORAL_TABLET | Freq: Every day | ORAL | 0 refills | Status: DC

## 2020-08-23 NOTE — Addendum Note (Signed)
Addended by: Marrion Coy on: 08/23/2020 01:16 PM   Modules accepted: Orders

## 2020-08-23 NOTE — Addendum Note (Signed)
Addended by: Matilde Sprang on: 08/23/2020 12:33 PM   Modules accepted: Orders

## 2020-08-23 NOTE — Addendum Note (Signed)
Addended by: Marrion Coy on: 08/23/2020 01:11 PM   Modules accepted: Orders

## 2020-08-23 NOTE — Progress Notes (Signed)
Subjective:    Patient ID: Christina Jacobs, female    DOB: 02/21/1941, 79 y.o.   MRN: [id number]  HPI Here to follow up on issues. She feels well in general. She asks for a refill on Ventolin inhaler which she uses when she feels SOB or wheezes after a heavy meal. 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 She feels herself go into atrial fibrillation from time to time, but these spells are brief. No SOB or chest pain with them. She recently saw Dr. Johnsie Cancel for this and she recently saw Dr. Scheryl Marten at [facility] for her myasthenia gravis.    Review of Systems  Constitutional: Negative.   HENT: Negative.   Eyes: Negative.   Respiratory: Negative.   Cardiovascular: Positive for palpitations.  Gastrointestinal: Negative.   Genitourinary: Negative for decreased urine volume, difficulty urinating, dyspareunia, dysuria, enuresis, flank pain, frequency, hematuria, pelvic pain and urgency.  Musculoskeletal: Negative.   Skin: Negative.   Neurological: Negative.   Psychiatric/Behavioral: Negative.        Objective:   Physical Exam Constitutional:      General: She is not in acute distress.    Appearance: She is well-developed.  HENT:     Head: Normocephalic and atraumatic.     Right Ear: External ear normal.     Left Ear: External ear normal.     Nose: Nose normal.     Mouth/Throat:     Pharynx: No oropharyngeal exudate.  Eyes:     General: No scleral icterus.    Conjunctiva/sclera: Conjunctivae normal.     Pupils: Pupils are equal, round, and reactive to light.  Neck:     Thyroid: No thyromegaly.     Vascular: No JVD.  Cardiovascular:     Rate and Rhythm: Normal rate and regular rhythm.     Heart sounds: Normal heart sounds. No murmur heard.  No friction rub. No gallop.   Pulmonary:     Effort: Pulmonary effort is normal. No respiratory distress.     Breath sounds: Normal breath sounds. No wheezing or rales.  Chest:     Chest wall: No tenderness.   Abdominal:     General: Bowel sounds are normal. There is no distension.     Palpations: Abdomen is soft. There is no mass.     Tenderness: There is no abdominal tenderness. There is no guarding or rebound.  Musculoskeletal:        General: No tenderness. Normal range of motion.     Cervical back: Normal range of motion and neck supple.  Lymphadenopathy:     Cervical: No cervical adenopathy.  Skin:    General: Skin is warm and dry.     Findings: No erythema or rash.  Neurological:     Mental Status: She is alert and oriented to person, place, and time.     Cranial Nerves: No cranial nerve deficit.     Motor: No abnormal muscle tone.     Coordination: Coordination normal.     Deep Tendon Reflexes: Reflexes are normal and symmetric. Reflexes normal.  Psychiatric:        Behavior: Behavior normal.        Thought Content: Thought content normal.        Judgment: Judgment normal.           Assessment & Plan:  Her HTN and atrial fibrillation are stable. Her myasthenia gravis is stable. Her GERD and microaspiration are stable. We will  get fasting labs today to check lipids and a thyroid panel. She is given a flu shot today, and she plans to get a Covid vaccine booster several weeks from now. Alysia Penna, MD

## 2020-08-24 LAB — HEPATIC FUNCTION PANEL
AG Ratio: 1.5 (calc) (ref 1.0–2.5)
ALT: 15 U/L (ref 6–29)
AST: 19 U/L (ref 10–35)
Albumin: 4 g/dL (ref 3.6–5.1)
Alkaline phosphatase (APISO): 60 U/L (ref 37–153)
Bilirubin, Direct: 0.3 mg/dL — ABNORMAL HIGH (ref 0.0–0.2)
Globulin: 2.6 g/dL (calc) (ref 1.9–3.7)
Indirect Bilirubin: 1.2 mg/dL (calc) (ref 0.2–1.2)
Total Bilirubin: 1.5 mg/dL — ABNORMAL HIGH (ref 0.2–1.2)
Total Protein: 6.6 g/dL (ref 6.1–8.1)

## 2020-08-24 LAB — LIPID PANEL
Cholesterol: 180 mg/dL (ref <200)
HDL: 65 mg/dL (ref >=50)
LDL Cholesterol (Calc): 91 mg/dL (calc)
Non-HDL Cholesterol (Calc): 115 mg/dL (calc) (ref <130)
Total CHOL/HDL Ratio: 2.8 (calc) (ref <5.0)
Triglycerides: 138 mg/dL (ref <150)

## 2020-08-24 LAB — CBC WITH DIFFERENTIAL/PLATELET
Absolute Monocytes: 562 cells/uL (ref 200–950)
Basophils Absolute: 43 cells/uL (ref 0–200)
Basophils Relative: 0.4 %
Eosinophils Absolute: 65 cells/uL (ref 15–500)
Eosinophils Relative: 0.6 %
HCT: 46.7 % — ABNORMAL HIGH (ref 35.0–45.0)
Hemoglobin: 15.5 g/dL (ref 11.7–15.5)
Lymphs Abs: 1631 cells/uL (ref 850–3900)
MCH: 31.1 pg (ref 27.0–33.0)
MCHC: 33.2 g/dL (ref 32.0–36.0)
MCV: 93.6 fL (ref 80.0–100.0)
MPV: 12 fL (ref 7.5–12.5)
Monocytes Relative: 5.2 %
Neutro Abs: 8500 cells/uL — ABNORMAL HIGH (ref 1500–7800)
Neutrophils Relative %: 78.7 %
Platelets: 136 10*3/uL — ABNORMAL LOW (ref 140–400)
RBC: 4.99 10*6/uL (ref 3.80–5.10)
RDW: 12.6 % (ref 11.0–15.0)
Total Lymphocyte: 15.1 %
WBC: 10.8 10*3/uL (ref 3.8–10.8)

## 2020-08-24 LAB — URINE CULTURE
MICRO NUMBER:: 11090172
Result:: NO GROWTH
SPECIMEN QUALITY:: ADEQUATE

## 2020-08-24 LAB — BASIC METABOLIC PANEL
BUN/Creatinine Ratio: 14 (calc) (ref 6–22)
BUN: 21 mg/dL (ref 7–25)
CO2: 30 mmol/L (ref 20–32)
Calcium: 10 mg/dL (ref 8.6–10.4)
Chloride: 102 mmol/L (ref 98–110)
Creat: 1.45 mg/dL — ABNORMAL HIGH (ref 0.60–0.93)
Glucose, Bld: 93 mg/dL (ref 65–99)
Potassium: 4.1 mmol/L (ref 3.5–5.3)
Sodium: 141 mmol/L (ref 135–146)

## 2020-08-24 LAB — TSH: TSH: 0.57 mIU/L (ref 0.40–4.50)

## 2020-08-24 LAB — T4, FREE: Free T4: 1.6 ng/dL (ref 0.8–1.8)

## 2020-08-24 LAB — T3, FREE: T3, Free: 3.4 pg/mL (ref 2.3–4.2)

## 2020-08-29 ENCOUNTER — Telehealth: Payer: Self-pay | Admitting: *Deleted

## 2020-08-29 NOTE — Telephone Encounter (Signed)
Patient called to discuss her lab results.

## 2020-08-29 NOTE — Telephone Encounter (Signed)
Patient called and says on her labs the GFR was not on there, which is important to her, and the renal insufficiency is concerning. She asks what does she need to do. I advised to stay hydrated, which she says she drinks plenty of water, and some medication causes this. She says she figured so and wonders will it get worse. I advises I will send her concerns to Dr. Sarajane Jews and call back with his recommendation.

## 2020-08-30 NOTE — Telephone Encounter (Signed)
I agree the GFR was not included for some reason. I have referred her to Nephrology to evaluate this further

## 2020-08-30 NOTE — Addendum Note (Signed)
Addended by: Alysia Penna A on: 08/30/2020 01:06 PM   Modules accepted: Orders

## 2020-11-09 ENCOUNTER — Other Ambulatory Visit: Payer: Self-pay | Admitting: Cardiovascular Disease

## 2020-11-09 NOTE — Telephone Encounter (Signed)
Prescription refill request for Xarelto received.  Indication: Afib Last office visit: Christina Jacobs 08/05/2020 Weight: 76 kg Age: 80 yo Scr:1.45, 08/23/2020 CrCl: 37 ml/min   Prescription refill sent.

## 2020-11-10 ENCOUNTER — Other Ambulatory Visit: Payer: Self-pay | Admitting: Pharmacist

## 2020-11-10 MED ORDER — RIVAROXABAN 15 MG PO TABS
ORAL_TABLET | ORAL | 1 refills | Status: DC
Start: 2020-11-10 — End: 2021-07-17

## 2020-12-06 LAB — HM MAMMOGRAPHY: HM Mammogram: ABNORMAL — AB (ref 0–4)

## 2020-12-07 ENCOUNTER — Other Ambulatory Visit: Payer: Self-pay

## 2020-12-20 ENCOUNTER — Telehealth: Payer: Self-pay

## 2020-12-20 NOTE — Telephone Encounter (Signed)
Biopsy order signed and faxed to [facility] @ [phone]. Dm/cma

## 2020-12-27 ENCOUNTER — Encounter: Payer: Self-pay | Admitting: Family Medicine

## 2020-12-29 ENCOUNTER — Telehealth: Payer: Self-pay | Admitting: Hematology and Oncology

## 2020-12-29 ENCOUNTER — Encounter: Payer: Self-pay | Admitting: Family Medicine

## 2020-12-29 NOTE — Telephone Encounter (Signed)
Spoke to patient to confirm AM clinic appointment for 3/2, packet being sent by [facility]

## 2020-12-30 ENCOUNTER — Ambulatory Visit: Payer: Medicare HMO | Admitting: Radiation Oncology

## 2021-01-02 ENCOUNTER — Encounter: Payer: Self-pay | Admitting: *Deleted

## 2021-01-02 DIAGNOSIS — Z170 Estrogen receptor positive status [ER+]: Secondary | ICD-10-CM | POA: Insufficient documentation

## 2021-01-02 DIAGNOSIS — C50412 Malignant neoplasm of upper-outer quadrant of left female breast: Secondary | ICD-10-CM

## 2021-01-03 ENCOUNTER — Telehealth: Payer: Self-pay | Admitting: Family Medicine

## 2021-01-03 NOTE — Telephone Encounter (Signed)
The office has received pt placard, Form has been placed in Dr Sarajane Jews folder for signing

## 2021-01-03 NOTE — Telephone Encounter (Signed)
Patient called to request a handicapp placard form to be completed  Call patient to pick up form at [phone]  Disposition: Dr' s Dierdre Highman

## 2021-01-03 NOTE — Progress Notes (Signed)
[place] NOTE  Patient Care Team: Laurey Morale, MD as PCP - General Josue Hector, MD as PCP - Cardiology (Cardiology) Elsie Stain, MD (Pulmonary Disease) [name], MD as Consulting Physician (General Surgery) [name], MD as Consulting Physician (General Surgery) [name], MD as Consulting Physician (Hematology and Oncology) Kyung Rudd, MD as Consulting Physician (Radiation Oncology) Rockwell Germany, RN as Oncology Nurse Navigator Mauro Kaufmann, RN as Oncology Nurse Navigator  CHIEF COMPLAINTS/PURPOSE OF CONSULTATION:  Newly diagnosed breast cancer  HISTORY OF PRESENTING ILLNESS:  Christina Jacobs 80 y.o. female is here because of recent diagnosis of breast cancer. Patient experienced chronic left breast tenderness for 2 years with a negative mammogram last year. Screening mammogram on 12/06/20 showed a 2.5cm central left breast mass and one enlarged lymph node. [place] on 12/13/20 showed a 2.6cm mass at the 12 o'clock position and a 1.3cm mass in the left axillary tail. She presents to the clinic today for initial evaluation and discussion of treatment options.   I reviewed her records extensively and collaborated the history with the patient.  SUMMARY OF ONCOLOGIC HISTORY: Oncology History  Malignant neoplasm of upper-outer quadrant of left breast in female, estrogen receptor positive ([place])  01/02/2021 Initial Diagnosis   Patient experienced chronic left breast tenderness for 2 years with a negative mammogram last year. Screening mammogram showed a 2.5cm central left breast mass and one enlarged lymph node. US showed a 2.6cm mass at the 12 o'clock position and a 1.3cm mass in the left axillary tail.     MEDICAL HISTORY:  Past Medical History:  Diagnosis Date  . Adenomatous colon polyp   . Anal fissure   . Asthma    Dr. Halford Chessman  . Atrial fibrillation ([place])   . Bowel obstruction ([place])   . Bursitis   . CKD (chronic kidney  disease) stage 3, GFR 30-59 ml/min (HCC)    sees Dr. [name]   . Complication of anesthesia    post op N/V  . Diverticulosis   . Glaucoma   . Hiatal hernia    sees Dr. [name] at [facility]   . Hyperlipidemia   . Hypertension   . Hypothyroidism   . Myasthenia gravis ([place]) 2013   followed at [facility] Dr Scheryl Marten, diplopia only  . Osteoarthritis    see's Dr. [name]  . Osteoporosis    last DEXA 08-03-11    SURGICAL HISTORY: Past Surgical History:  Procedure Laterality Date  . ABDOMINAL HYSTERECTOMY    . APPENDECTOMY    . BREAST LUMPECTOMY Bilateral     several, enign breast lumps removed  . CARDIOVERSION N/A 12/31/2017   Procedure: CARDIOVERSION;  Surgeon: Josue Hector, MD;  Location: [facility] ENDOSCOPY;  Service: Cardiovascular;  Laterality: N/A;  . CARDIOVERSION N/A 02/11/2018   Procedure: CARDIOVERSION;  Surgeon: Josue Hector, MD;  Location: [facility] ENDOSCOPY;  Service: Cardiovascular;  Laterality: N/A;  . COLONOSCOPY  09/27/2010   per Dr. Sharlett Iles, benign polyp, no repeats needed   . HERNIA REPAIR  10-18-10   Dr. Hassell Done attempted but could reduce this, the entire stomach is above the diaphragm  . THYROIDECTOMY, PARTIAL  1999  . TONSILLECTOMY      SOCIAL HISTORY: Social History   Socioeconomic History  . Marital status: Married    Spouse name: Not on file  . Number of children: 3  . Years of education: Not on file  . Highest education level: Not on file  Occupational History  . Occupation: retired    Comment: [employer]  Tobacco Use  . Smoking status: Never Smoker  . Smokeless tobacco: Never Used  Vaping Use  . Vaping Use: Never used  Substance and Sexual Activity  . Alcohol use: No    Alcohol/week: 0.0 standard drinks  . Drug use: No  . Sexual activity: Not on file  Other Topics Concern  . Not on file  Social History Narrative   Married      Social Determinants of Health   Financial Resource Strain: Not on file  Food Insecurity: Not on file   Transportation Needs: Not on file  Physical Activity: Not on file  Stress: Not on file  Social Connections: Not on file  Intimate Partner Violence: Not on file    FAMILY HISTORY: Family History  Problem Relation Age of Onset  . Breast cancer Maternal Grandmother   . Diabetes Father   . Hyperlipidemia Father   . Hypertension Father   . Cancer Father        origin unknown  . Kidney cancer Mother   . Clotting disorder Brother   . Cancer Sister        2015, anal ca  . Colon cancer Son 73    ALLERGIES:  is allergic to codeine, eliquis [apixaban], propoxyphene hcl, meperidine hcl, and tape.  MEDICATIONS:  Current Outpatient Medications  Medication Sig Dispense Refill  . albuterol (VENTOLIN HFA) 108 (90 Base) MCG/ACT inhaler Inhale 2 puffs into the lungs every 4 (four) hours as needed for wheezing or shortness of breath. 18 g 5  . AMBULATORY NON FORMULARY MEDICATION Nitroglycerine ointment 0.125 %  Apply a pea sized amount internally three times daily for 6 - 8 weeks 30 g 0  . atorvastatin (LIPITOR) 10 MG tablet TAKE 1 TABLET BY MOUTH EVERY DAY 90 tablet 3  . flecainide (TAMBOCOR) 50 MG tablet TAKE 1 TABLET BY MOUTH TWICE A DAY 180 tablet 2  . Latanoprost 0.005 % EMUL Apply to eye daily.    Marland Kitchen levothyroxine (SYNTHROID) 100 MCG tablet TAKE 1 TABLET BY MOUTH EVERY DAY 90 tablet 3  . metoprolol succinate (TOPROL-XL) 50 MG 24 hr tablet Take 1 tablet (50 mg total) by mouth daily. Take with or immediately following a meal. 270 tablet 0  . multivitamin-iron-minerals-folic acid (CENTRUM) chewable tablet Chew 1 tablet by mouth daily.    Marland Kitchen omeprazole (PRILOSEC) 40 MG capsule TAKE 1 CAPSULE (40 MG TOTAL) BY MOUTH DAILY AS NEEDED (REFLUX). 90 capsule 3  . predniSONE (DELTASONE) 1 MG tablet Take 4 mg by mouth daily.    . Rivaroxaban (XARELTO) 15 MG TABS tablet TAKE 1 TABLET (15 MG TOTAL) BY MOUTH DAILY WITH SUPPER. 90 tablet 1  . timolol (BETIMOL) 0.5 % ophthalmic solution Place 1 drop into both  eyes 2 (two) times daily.    Marland Kitchen triamterene-hydrochlorothiazide (DYAZIDE) 37.5-25 MG capsule Take 1 each (1 capsule total) by mouth daily. 90 capsule 3   No current facility-administered medications for this visit.    REVIEW OF SYSTEMS:   Constitutional: Denies fevers, chills or abnormal night sweats Eyes: Denies blurriness of vision, double vision or watery eyes Ears, nose, mouth, throat, and face: Denies mucositis or sore throat Respiratory: Denies cough, dyspnea or wheezes Cardiovascular: Denies palpitation, chest discomfort or lower extremity swelling Gastrointestinal:  Denies nausea, heartburn or change in bowel habits Skin: Denies abnormal skin rashes Lymphatics: Denies new lymphadenopathy or easy bruising Neurological:Denies numbness, tingling or new weaknesses Behavioral/Psych: Mood  is stable, no new changes  Breast: Denies any palpable lumps or discharge All other systems were reviewed with the patient and are negative.  PHYSICAL EXAMINATION: ECOG PERFORMANCE STATUS: 1 - Symptomatic but completely ambulatory  Vitals:   01/04/21 0844  BP: (!) 160/77  Pulse: (!) 58  Resp: 17  Temp: 97.7 F (36.5 C)  SpO2: 97%   Filed Weights   01/04/21 0844  Weight: 165 lb 14.4 oz (75.3 kg)    GENERAL:alert, no distress and comfortable     LABORATORY DATA:  I have reviewed the data as listed Lab Results  Component Value Date   WBC 7.7 01/04/2021   HGB 14.6 01/04/2021   HCT 44.4 01/04/2021   MCV 91.9 01/04/2021   PLT 165 01/04/2021   Lab Results  Component Value Date   NA 141 01/04/2021   K 3.3 (L) 01/04/2021   CL 105 01/04/2021   CO2 28 01/04/2021    RADIOGRAPHIC STUDIES: I have personally reviewed the radiological reports and agreed with the findings in the report.  ASSESSMENT AND PLAN:  Malignant neoplasm of upper-outer quadrant of left breast in female, estrogen receptor positive ([place]) 01/02/2021: Screening mammogram detected left breast mass at 12 o'clock  position 2.5 cm spiculated mass.  Abnormal left axillary lymph node 1.3 cm.  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 After reviewing the pathology in detail, we proceeded to discuss the different treatment options between surgery, radiation, chemotherapy, antiestrogen therapies.  Treatment plan: 1.  MammaPrint testing to determine if she would benefit from systemic chemotherapy 2. if she needs chemotherapy she will get it neoadjuvant fashion.  If she does not need chemotherapy then she will undergo surgery upfront. 3.  Breast conserving surgery with targeted lymph node surgery 4.  Adjuvant radiation 5.  Followed by adjuvant antiestrogen pills with anastrozole 1 mg daily x7 years  Mammaprint counseling: MINDACT is a prospective, randomized phase III controlled trial that investigates the clinical utility of MammaPrint, when compared to standard clinical pathological criteria, with 6,693 patients enrolled from over 111 institutions. Clinical high-risk patients with a Low Risk MammaPrint result, including 48% node-positive, had 5-year distant metastasis-free survival rate in excess of 94 percent, whether randomized to receive adjuvant chemotherapy or not proving MammaPrint's ability to safely identify Low Risk patients.  Return to clinic based on MammaPrint test results   All questions were answered. The patient knows to call the clinic with any problems, questions or concerns.   Rulon Eisenmenger, MD, MPH 01/04/2021    I, Molly Dorshimer, am acting as scribe for [name], MD.  I have reviewed the above documentation for  accuracy and completeness, and I agree with the above.

## 2021-01-04 ENCOUNTER — Telehealth: Payer: Self-pay | Admitting: *Deleted

## 2021-01-04 ENCOUNTER — Encounter: Payer: Self-pay | Admitting: Physical Therapy

## 2021-01-04 ENCOUNTER — Encounter: Payer: Self-pay | Admitting: *Deleted

## 2021-01-04 ENCOUNTER — Inpatient Hospital Stay: Payer: Medicare HMO | Attending: Hematology and Oncology | Admitting: Hematology and Oncology

## 2021-01-04 ENCOUNTER — Ambulatory Visit: Payer: Medicare HMO | Attending: General Surgery | Admitting: Physical Therapy

## 2021-01-04 ENCOUNTER — Encounter: Payer: Self-pay | Admitting: Licensed Clinical Social Worker

## 2021-01-04 ENCOUNTER — Ambulatory Visit
Admission: RE | Admit: 2021-01-04 | Discharge: 2021-01-04 | Disposition: A | Discharge status: Home / Self Care | Payer: Medicare HMO | Source: Ambulatory Visit | Attending: Radiation Oncology | Admitting: Radiation Oncology

## 2021-01-04 ENCOUNTER — Other Ambulatory Visit: Payer: Self-pay

## 2021-01-04 ENCOUNTER — Inpatient Hospital Stay: Payer: Medicare HMO

## 2021-01-04 DIAGNOSIS — Z170 Estrogen receptor positive status [ER+]: Secondary | ICD-10-CM

## 2021-01-04 DIAGNOSIS — Z809 Family history of malignant neoplasm, unspecified: Secondary | ICD-10-CM | POA: Diagnosis not present

## 2021-01-04 DIAGNOSIS — Z5189 Encounter for other specified aftercare: Secondary | ICD-10-CM | POA: Insufficient documentation

## 2021-01-04 DIAGNOSIS — Z800 Family history of malignant neoplasm of digestive organs: Secondary | ICD-10-CM | POA: Insufficient documentation

## 2021-01-04 DIAGNOSIS — C50412 Malignant neoplasm of upper-outer quadrant of left female breast: Secondary | ICD-10-CM

## 2021-01-04 DIAGNOSIS — Z808 Family history of malignant neoplasm of other organs or systems: Secondary | ICD-10-CM | POA: Insufficient documentation

## 2021-01-04 DIAGNOSIS — C773 Secondary and unspecified malignant neoplasm of axilla and upper limb lymph nodes: Secondary | ICD-10-CM

## 2021-01-04 DIAGNOSIS — Z803 Family history of malignant neoplasm of breast: Secondary | ICD-10-CM | POA: Insufficient documentation

## 2021-01-04 DIAGNOSIS — R293 Abnormal posture: Secondary | ICD-10-CM | POA: Diagnosis present

## 2021-01-04 DIAGNOSIS — Z5111 Encounter for antineoplastic chemotherapy: Secondary | ICD-10-CM | POA: Insufficient documentation

## 2021-01-04 LAB — CBC WITH DIFFERENTIAL (CANCER CENTER ONLY)
Abs Immature Granulocytes: 0.02 10*3/uL (ref 0.00–0.07)
Basophils Absolute: 0 10*3/uL (ref 0.0–0.1)
Basophils Relative: 1 %
Eosinophils Absolute: 0.1 10*3/uL (ref 0.0–0.5)
Eosinophils Relative: 2 %
HCT: 44.4 % (ref 36.0–46.0)
Hemoglobin: 14.6 g/dL (ref 12.0–15.0)
Immature Granulocytes: 0 %
Lymphocytes Relative: 29 %
Lymphs Abs: 2.2 10*3/uL (ref 0.7–4.0)
MCH: 30.2 pg (ref 26.0–34.0)
MCHC: 32.9 g/dL (ref 30.0–36.0)
MCV: 91.9 fL (ref 80.0–100.0)
Monocytes Absolute: 0.5 10*3/uL (ref 0.1–1.0)
Monocytes Relative: 7 %
Neutro Abs: 4.8 10*3/uL (ref 1.7–7.7)
Neutrophils Relative %: 61 %
Platelet Count: 165 10*3/uL (ref 150–400)
RBC: 4.83 MIL/uL (ref 3.87–5.11)
RDW: 13.1 % (ref 11.5–15.5)
WBC Count: 7.7 10*3/uL (ref 4.0–10.5)
nRBC: 0 % (ref 0.0–0.2)

## 2021-01-04 LAB — CMP (CANCER CENTER ONLY)
ALT: 21 U/L (ref 0–44)
AST: 20 U/L (ref 15–41)
Albumin: 3.6 g/dL (ref 3.5–5.0)
Alkaline Phosphatase: 54 U/L (ref 38–126)
Anion gap: 8 (ref 5–15)
BUN: 17 mg/dL (ref 8–23)
CO2: 28 mmol/L (ref 22–32)
Calcium: 9.5 mg/dL (ref 8.9–10.3)
Chloride: 105 mmol/L (ref 98–111)
Creatinine: 1.37 mg/dL — ABNORMAL HIGH (ref 0.44–1.00)
GFR, Estimated: 39 mL/min — ABNORMAL LOW (ref >60)
Glucose, Bld: 111 mg/dL — ABNORMAL HIGH (ref 70–99)
Potassium: 3.3 mmol/L — ABNORMAL LOW (ref 3.5–5.1)
Sodium: 141 mmol/L (ref 135–145)
Total Bilirubin: 1.3 mg/dL — ABNORMAL HIGH (ref 0.3–1.2)
Total Protein: 6.5 g/dL (ref 6.5–8.1)

## 2021-01-04 LAB — GENETIC SCREENING ORDER

## 2021-01-04 NOTE — Progress Notes (Signed)
[place] Work  Initial Assessment   Christina Jacobs is a 80 y.o. year old female accompanied by patient and husband. Clinical Social Work was referred by [facility] for assessment of psychosocial needs.   SDOH (Social Determinants of Health) assessments performed: Yes SDOH Interventions   Flowsheet Row Most Recent Value  SDOH Interventions   Food Insecurity Interventions Intervention Not Indicated  Financial Strain Interventions Intervention Not Indicated  Housing Interventions Intervention Not Indicated  Transportation Interventions Intervention Not Indicated      Distress Screen completed: Yes ONCBCN DISTRESS SCREENING 01/04/2021  Screening Type Initial Screening  Distress experienced in past week (1-10) 4  Emotional problem type Nervousness/Anxiety  Physical Problem type Sleep/insomnia    Family/Social Information:  . Housing Arrangement: patient lives with husband . Family members/support persons in your life? Family (husband, 3 kids- [place] in [place], [place] in [place], [place] in [place]) and Friends . Transportation concerns: no  . Employment: Retired. Income source: Conservation officer, historic buildings and savings . Financial concerns: No o Type of concern: None . Food access concerns: no . Services Currently in place:  n/a  Coping/ Adjustment to diagnosis: . Patient understands treatment plan and what happens next? yes, nervous with waiting for oncotype, but understands the plan . Concerns about diagnosis and/or treatment: General adjustment to diagnosis and information . Patient reported stressors: Nervousness . Patient enjoys reading, watching TV and time with family/ friends . Current coping skills/ strengths: Capable of independent living, Scientist, research (life sciences), Motivation for treatment/growth and Supportive family/friends    SUMMARY: Current SDOH Barriers:  . No significant SDOH barriers noted today  Clinical Social Work Clinical Goal(s):  Marland Kitchen No clinical SW goals at  this time  Interventions: . Discussed common feeling and emotions when being diagnosed with cancer, and the importance of support during treatment . Informed patient of the support team roles and support services at [facility] . Provided CSW contact information and encouraged patient to call with any questions or concerns  Follow Up Plan: Patient will contact CSW with any support or resource needs Patient verbalizes understanding of plan: Yes    Christeen Douglas , LCSW

## 2021-01-04 NOTE — Progress Notes (Signed)
Radiation Oncology         [phone] ________________________________  Name: Christina Jacobs        MRN: [id number]  Date of Service: 01/04/2021 DOB: [birth year]  CC:Fry, Ishmael Holter, MD  Laurey Morale, MD     REFERRING PHYSICIAN: Laurey Morale, MD   DIAGNOSIS: The encounter diagnosis was Malignant neoplasm of upper-outer quadrant of left breast in female, estrogen receptor positive ([place]).   HISTORY OF PRESENT ILLNESS: Christina Jacobs is a 80 y.o. female seen in the multidisciplinary breast clinic for a new diagnosis of left breast cancer. The patient was noted to have a screening detected mass in the left breast at 12:00. By diagnostic imaging this measured 2.4 cm and her axilla showed a 1.3 cm node. 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 Her tumor was ER positive, PR negative, HEr2 negative with a Ki 67 of 20%. She's seen today to discuss treatment options for her cancer.    PREVIOUS RADIATION THERAPY: No   PAST MEDICAL HISTORY:  Past Medical History:  Diagnosis Date  . Adenomatous colon polyp   . Anal fissure   . Asthma    Dr. Halford Chessman  . Bowel obstruction ([place])   . Bursitis   . CKD (chronic kidney disease) stage 3, GFR 30-59 ml/min (HCC)    sees Dr. [name]   . Complication of anesthesia    post op N/V  . Diverticulosis   . Glaucoma   . Hiatal hernia    sees Dr. [name] at [facility]   . Hyperlipidemia   . Hypertension   . Hypothyroidism   . Myasthenia gravis ([place]) 2013   followed at [facility] Dr Scheryl Marten, diplopia only  . Osteoarthritis    see's Dr. [name]  . Osteoporosis    last DEXA 08-03-11       PAST SURGICAL HISTORY: Past Surgical History:  Procedure Laterality Date  . ABDOMINAL HYSTERECTOMY    . APPENDECTOMY    . BREAST LUMPECTOMY Bilateral     several, enign breast lumps removed  . CARDIOVERSION N/A 12/31/2017   Procedure: CARDIOVERSION;  Surgeon: Josue Hector,  MD;  Location: [facility] ENDOSCOPY;  Service: Cardiovascular;  Laterality: N/A;  . CARDIOVERSION N/A 02/11/2018   Procedure: CARDIOVERSION;  Surgeon: Josue Hector, MD;  Location: [facility] ENDOSCOPY;  Service: Cardiovascular;  Laterality: N/A;  . COLONOSCOPY  09/27/2010   per Dr. Sharlett Iles, benign polyp, no repeats needed   . HERNIA REPAIR  10-18-10   Dr. Hassell Done attempted but could reduce this, the entire stomach is above the diaphragm  . THYROIDECTOMY, PARTIAL  1999  . TONSILLECTOMY       FAMILY HISTORY:  Family History  Problem Relation Age of Onset  . Breast cancer Maternal Grandmother   . Diabetes Father   . Hyperlipidemia Father   . Hypertension Father   . Cancer Father        origin unknown  . Kidney cancer Mother   . Clotting disorder Brother   . Cancer Sister        2015, anal ca  . Colon cancer Son 32     SOCIAL HISTORY:  reports that she has never smoked. She has never used smokeless tobacco. She reports that she does not drink alcohol and does not use drugs. The patient is married and lives in [place].   ALLERGIES: Codeine, Eliquis [apixaban], Propoxyphene hcl, Meperidine hcl, and Tape  MEDICATIONS:  Current Outpatient Medications  Medication Sig Dispense Refill  . albuterol (VENTOLIN HFA) 108 (90 Base) MCG/ACT inhaler Inhale 2 puffs into the lungs every 4 (four) hours as needed for wheezing or shortness of breath. 18 g 5  . AMBULATORY NON FORMULARY MEDICATION Nitroglycerine ointment 0.125 %  Apply a pea sized amount internally three times daily for 6 - 8 weeks 30 g 0  . atorvastatin (LIPITOR) 10 MG tablet TAKE 1 TABLET BY MOUTH EVERY DAY 90 tablet 3  . flecainide (TAMBOCOR) 50 MG tablet TAKE 1 TABLET BY MOUTH TWICE A DAY 180 tablet 2  . Latanoprost 0.005 % EMUL Apply to eye daily.    Marland Kitchen levothyroxine (SYNTHROID) 100 MCG tablet TAKE 1 TABLET BY MOUTH EVERY DAY 90 tablet 3  . Melatonin 5 MG CAPS Take 5 mg by mouth at bedtime as needed (for sleep).    . metoprolol  succinate (TOPROL-XL) 50 MG 24 hr tablet Take 1 tablet (50 mg total) by mouth daily. Take with or immediately following a meal. 270 tablet 0  . multivitamin-iron-minerals-folic acid (CENTRUM) chewable tablet Chew 1 tablet by mouth daily.    Marland Kitchen omeprazole (PRILOSEC) 40 MG capsule TAKE 1 CAPSULE (40 MG TOTAL) BY MOUTH DAILY AS NEEDED (REFLUX). 90 capsule 3  . predniSONE (DELTASONE) 1 MG tablet Take 4 mg by mouth daily.    . psyllium (METAMUCIL) 58.6 % powder Take 1 packet by mouth daily as needed (for constipation).     . Rivaroxaban (XARELTO) 15 MG TABS tablet TAKE 1 TABLET (15 MG TOTAL) BY MOUTH DAILY WITH SUPPER. 90 tablet 1  . timolol (BETIMOL) 0.5 % ophthalmic solution Place 1 drop into both eyes 2 (two) times daily.    Marland Kitchen triamterene-hydrochlorothiazide (DYAZIDE) 37.5-25 MG capsule Take 1 each (1 capsule total) by mouth daily. 90 capsule 3   No current facility-administered medications for this encounter.     REVIEW OF SYSTEMS: On review of systems, the patient reports that she is doing well overall. She has a history of atrial fibrillation and at times has palpitations. She does have myasthenia gravis but states she has been doing very well with using prednisone and denies any active symptoms from this. She is followed by neurology at [facility].     PHYSICAL EXAM:  Wt Readings from Last 3 Encounters:  08/23/20 167 lb 9.6 oz (76 kg)  08/05/20 168 lb 12.8 oz (76.6 kg)  01/26/20 169 lb (76.7 kg)   Temp Readings from Last 3 Encounters:  08/23/20 97.9 F (36.6 C) (Oral)  01/25/20 (!) 97.3 F (36.3 C) (Temporal)  12/23/19 98 F (36.7 C)   BP Readings from Last 3 Encounters:  08/23/20 134/70  08/05/20 128/78  01/26/20 (!) 130/92   Pulse Readings from Last 3 Encounters:  08/23/20 77  08/05/20 63  01/26/20 65    In general this is a well appearing caucasian female in no acute distress. She's alert and oriented x4 and appropriate throughout the examination. Cardiopulmonary assessment is  negative for acute distress and she exhibits normal effort. Bilateral breast exam is deferred.    ECOG = 1  0 - Asymptomatic (Fully active, able to carry on all predisease activities without restriction)  1 - Symptomatic but completely ambulatory (Restricted in physically strenuous activity but ambulatory and able to carry out work of a light or sedentary nature. For example, light housework, office work)  2 - Symptomatic, <50% in bed during the day (Ambulatory and capable of all self care but  unable to carry out any work activities. Up and about more than 50% of waking hours)  3 - Symptomatic, >50% in bed, but not bedbound (Capable of only limited self-care, confined to bed or chair 50% or more of waking hours)  4 - Bedbound (Completely disabled. Cannot carry on any self-care. Totally confined to bed or chair)  5 - Death   Eustace Pen MM, Creech RH, Tormey DC, et al. [phone]). "Toxicity and response criteria of the [facility]". [place] Oncol. 5 (6): 649-55    LABORATORY DATA:  Lab Results  Component Value Date   WBC 10.8 08/23/2020   HGB 15.5 08/23/2020   HCT 46.7 (H) 08/23/2020   MCV 93.6 08/23/2020   PLT 136 (L) 08/23/2020   Lab Results  Component Value Date   NA 141 08/23/2020   K 4.1 08/23/2020   CL 102 08/23/2020   CO2 30 08/23/2020   Lab Results  Component Value Date   ALT 15 08/23/2020   AST 19 08/23/2020   ALKPHOS 55 08/19/2019   BILITOT 1.5 (H) 08/23/2020      RADIOGRAPHY: No results found.     IMPRESSION/PLAN: 1. 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 Dr. Lisbeth Renshaw discusses the pathology findings and reviews the nature of left breast disease.    The consensus from the breast conference includes mammaprint testing on her biopsy to determine a role for neoadjuvant chemotherapy. Dr. Lindi Adie has discussed with the patient and Dr. Donne Hazel is hopeful to offer a lumpectomy with targeted axillary  dissection. We reviewed that  she would benefit from  external radiotherapy to the breast and regional nodes  to reduce risks of local recurrence followed by antiestrogen therapy. We discussed the risks, benefits, short, and long term effects of radiotherapy, as well as the curative intent, and the patient is interested in proceeding. Dr. Lisbeth Renshaw discusses the delivery and logistics of radiotherapy and anticipates a course of 6 1/2 weeks of radiotherapy to the left breast as well as regional nodes with deep inspiration breath hold technique. We will see her back a few weeks after surgery to discuss the simulation process and anticipate we starting radiotherapy about 4-6 weeks after surgery.    In a visit lasting 60 minutes, greater than 50% of the time was spent face to face reviewing her case, as well as in preparation of, discussing, and coordinating the patient's care.  The above documentation reflects my direct findings during this shared patient visit. Please see the separate note by Dr. Lisbeth Renshaw on this date for the remainder of the patient's plan of care.    Carola Rhine, [facility]    **Disclaimer: This note was dictated with voice recognition software. Similar sounding words can inadvertently be transcribed and this note may contain transcription errors which may not have been corrected upon publication of note.**

## 2021-01-04 NOTE — Assessment & Plan Note (Addendum)
01/02/2021: Screening mammogram detected left breast mass at 12 o'clock position 2.5 cm spiculated mass.  Abnormal left axillary lymph node 1.3 cm.  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 After reviewing the pathology in detail, we proceeded to discuss the different treatment options between surgery, radiation, chemotherapy, antiestrogen therapies.  Treatment plan: 1.  MammaPrint testing to determine if she would benefit from systemic chemotherapy 2. if she needs chemotherapy she will get it neoadjuvant fashion.  If she does not need chemotherapy then she will undergo surgery upfront. 3.  Breast conserving surgery with targeted lymph node surgery 4.  Adjuvant radiation 5.  Followed by adjuvant antiestrogen pills with anastrozole 1 mg daily x7 years  Mammaprint counseling: MINDACT is a prospective, randomized phase III controlled trial that investigates the clinical utility of MammaPrint, when compared to standard clinical pathological criteria, with 6,693 patients enrolled from over 111 institutions. Clinical high-risk patients with a Low Risk MammaPrint result, including 48% node-positive, had 5-year distant metastasis-free survival rate in excess of 94 percent, whether randomized to receive adjuvant chemotherapy or not proving MammaPrint's ability to safely identify Low Risk patients.  Return to clinic based on MammaPrint test results

## 2021-01-04 NOTE — Telephone Encounter (Signed)
Received order for mammaprint testing on core biopsy.

## 2021-01-04 NOTE — Patient Instructions (Signed)

## 2021-01-04 NOTE — Therapy (Signed)
[place], [place], [address] Phone: [phone]   Fax:  [phone]  Physical Therapy Evaluation  Patient Details  Name: Christina Jacobs MRN: [id number] Date of Birth: 02-14-1941 Referring Provider (PT): Dr. [name]   Encounter Date: 01/04/2021   PT End of Session - 01/04/21 1611    Visit Number 1    Number of Visits 2    Date for PT Re-Evaluation 07/07/21    PT Start Time 8329    PT Stop Time 1118    PT Time Calculation (min) 31 min    Activity Tolerance Patient tolerated treatment well    Behavior During Therapy [facility] for tasks assessed/performed           Past Medical History:  Diagnosis Date  . Adenomatous colon polyp   . Anal fissure   . Asthma    Dr. Halford Chessman  . Atrial fibrillation ([place])   . Bowel obstruction ([place])   . Bursitis   . CKD (chronic kidney disease) stage 3, GFR 30-59 ml/min (HCC)    sees Dr. [name]   . Complication of anesthesia    post op N/V  . Diverticulosis   . Glaucoma   . Hiatal hernia    sees Dr. [name] at [facility]   . Hyperlipidemia   . Hypertension   . Hypothyroidism   . Myasthenia gravis ([place]) 2013   followed at [facility] Dr Scheryl Marten, diplopia only  . Osteoarthritis    see's Dr. [name]  . Osteoporosis    last DEXA 08-03-11    Past Surgical History:  Procedure Laterality Date  . ABDOMINAL HYSTERECTOMY    . APPENDECTOMY    . BREAST LUMPECTOMY Bilateral     several, enign breast lumps removed  . CARDIOVERSION N/A 12/31/2017   Procedure: CARDIOVERSION;  Surgeon: Josue Hector, MD;  Location: [facility] ENDOSCOPY;  Service: Cardiovascular;  Laterality: N/A;  . CARDIOVERSION N/A 02/11/2018   Procedure: CARDIOVERSION;  Surgeon: Josue Hector, MD;  Location: [facility] ENDOSCOPY;  Service: Cardiovascular;  Laterality: N/A;  . COLONOSCOPY  09/27/2010   per Dr. Sharlett Iles, benign polyp, no repeats needed   . HERNIA REPAIR  10-18-10   Dr. Hassell Done  attempted but could reduce this, the entire stomach is above the diaphragm  . THYROIDECTOMY, PARTIAL  1999  . TONSILLECTOMY      There were no vitals filed for this visit.    Subjective Assessment - 01/04/21 1604    Subjective Patient reports she is here today to be seen by her medical team for her newly diagnosed left breast cancer.    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 It measures 2.6 cm and is located in the upper outer quadrant. It is ER positive, PR negative, and HER2 negative with a Ki67 of 20%. She has a hiatal hernia which is severe and limits her ability to exercise.    Patient Stated Goals reduce lymphedema risk and learn post op shoulder ROM HEP    Currently in Pain? No/denies              [facility] PT Assessment - 01/04/21 0001      Assessment   Medical Diagnosis Left breast cancer    Referring Provider (PT) Dr. [name]    Onset Date/Surgical Date 12/06/20    Hand Dominance Right    Prior Therapy  none      Precautions   Precautions Other (comment)    Precaution Comments active cancer      Restrictions   Weight Bearing Restrictions No      Balance Screen   Has the patient fallen in the past 6 months No    Has the patient had a decrease in activity level because of a fear of falling?  No    Is the patient reluctant to leave their home because of a fear of falling?  No      Home Environment   Living Environment Private residence    Living Arrangements Spouse/significant other    Available Help at Discharge Family      Prior Function   Level of [place] Retired    Leisure She does not exercise due to hiatal hernia which impacts breathing      Cognition   Overall Cognitive Status Within Functional Limits for tasks assessed      Posture/Postural Control   Posture/Postural Control Postural limitations     Postural Limitations Rounded Shoulders;Forward head;Increased thoracic kyphosis      ROM / Strength   AROM / PROM / Strength AROM;Strength      AROM   Overall AROM Comments Bilateral cervical rotation and sidebending limited 25%    AROM Assessment Site Shoulder    Right/Left Shoulder Right;Left    Right Shoulder Extension 54 Degrees    Right Shoulder Flexion 133 Degrees    Right Shoulder ABduction 145 Degrees    Right Shoulder Internal Rotation 66 Degrees    Right Shoulder External Rotation 90 Degrees    Left Shoulder Extension 63 Degrees    Left Shoulder Flexion 131 Degrees    Left Shoulder ABduction 131 Degrees    Left Shoulder Internal Rotation 72 Degrees    Left Shoulder External Rotation 78 Degrees      Strength   Overall Strength Within functional limits for tasks performed             LYMPHEDEMA/ONCOLOGY QUESTIONNAIRE - 01/04/21 0001      Type   Cancer Type Left breast cancer      Lymphedema Assessments   Lymphedema Assessments Upper extremities      Right Upper Extremity Lymphedema   10 cm Proximal to Olecranon Process 28.5 cm    Olecranon Process 22.3 cm    10 cm Proximal to Ulnar Styloid Process 20.4 cm    Just Proximal to Ulnar Styloid Process 14 cm    Across Hand at [employer] 17.5 cm    At [place] of 2nd Digit 5.7 cm      Left Upper Extremity Lymphedema   10 cm Proximal to Olecranon Process 27.5 cm    Olecranon Process 23.2 cm    10 cm Proximal to Ulnar Styloid Process 18.8 cm    Just Proximal to Ulnar Styloid Process 13.3 cm    Across Hand at [employer] 16 cm    At [place] of 2nd Digit 5.3 cm           L-DEX FLOWSHEETS - 01/04/21 1600      L-DEX LYMPHEDEMA SCREENING   Measurement Type Unilateral    L-DEX MEASUREMENT EXTREMITY Upper Extremity    POSITION  Standing    DOMINANT SIDE Right    At Risk Side Left    BASELINE SCORE (UNILATERAL) -1           The patient was assessed using the L-Dex  machine today to produce a lymphedema  index baseline score. The patient will be reassessed on a regular basis (typically every 3 months) to obtain new L-Dex scores. If the score is > 6.5 points away from his/her baseline score indicating onset of subclinical lymphedema, it will be recommended to wear a compression garment for 4 weeks, 12 hours per day and then be reassessed. If the score continues to be > 6.5 points from baseline at reassessment, we will initiate lymphedema treatment. Assessing in this manner has a 95% rate of preventing clinically significant lymphedema.      Katina Dung - 01/04/21 0001    Open a tight or new jar Mild difficulty    Do heavy household chores (wash walls, wash floors) Severe difficulty    Carry a shopping bag or briefcase Moderate difficulty    Wash your back Mild difficulty    Use a knife to cut food No difficulty    Recreational activities in which you take some force or impact through your arm, shoulder, or hand (golf, hammering, tennis) Unable    During the past week, to what extent has your arm, shoulder or hand problem interfered with your normal social activities with family, friends, neighbors, or groups? Slightly    During the past week, to what extent has your arm, shoulder or hand problem limited your work or other regular daily activities Not at all    Arm, shoulder, or hand pain. None    Tingling (pins and needles) in your arm, shoulder, or hand None    Difficulty Sleeping No difficulty    DASH Score 27.27 %            Objective measurements completed on examination: See above findings.          Patient was instructed today in a home exercise program today for post op shoulder range of motion. These included active assist shoulder flexion in sitting, scapular retraction, wall walking with shoulder abduction, and hands behind head external rotation.  She was encouraged to do these twice a day, holding 3 seconds and repeating 5 times when permitted by her  physician.         PT Education - 01/04/21 1610    Education Details Lymphedema risk reduction and post op shoulder ROM HEP    Person(s) Educated Patient;Spouse    Methods Explanation;Demonstration    Comprehension Returned demonstration;Verbalized understanding               PT Long Term Goals - 01/04/21 1615      PT LONG TERM GOAL #1   Title Patient will demonstrate she has regained full shoulder ROM and function post operatively compared to baselines.    Time 6    Period Months    Status New    Target Date 07/07/21           Breast Clinic Goals - 01/04/21 1615      Patient will be able to verbalize understanding of pertinent lymphedema risk reduction practices relevant to her diagnosis specifically related to skin care.   Time 1    Period Days    Status Achieved      Patient will be able to return demonstrate and/or verbalize understanding of the post-op home exercise program related to regaining shoulder range of motion.   Time 1    Period Days    Status Achieved      Patient will be able to verbalize understanding of the importance of attending the  postoperative After Breast Cancer Class for further lymphedema risk reduction education and therapeutic exercise.   Time 1    Period Days    Status Achieved                 Plan - 01/04/21 1612    Clinical Impression Statement Patient was diagnosed on 12/06/2020 with left grade III invasive ductal carcinoma breast cancer. It measures 2.6 cm and is located in the upper outer quadrant. It is ER positive, PR negative, and HER2 negative with a Ki67 of 20%. Her multidisciplinary medical team met prior ot her assessments to determine a recommended treatment plan She is planning ot have a Mammaprint test performed to determine the need for chemotherapy. She will then either do neoadjuvant chemo or a left lumpectomy and targeted axillary node dissection followed by radiation and anti-estrogen therapy. If no chemo is  needed, she will still have surgery, radiation, and anti-estrogen therapy. She will benefit from a post op PT reassessment to determine needs and from L-Dex screens every 3 months for 2 yeras to detect subclinical lymphedema.    Stability/Clinical Decision Making Stable/Uncomplicated    Clinical Decision Making Low    Rehab Potential Excellent    PT Frequency --   Eval and 1 f/u visit   PT Treatment/Interventions ADLs/Self Care Home Management;Therapeutic exercise;Patient/family education    PT Next Visit Plan Will reassess 3-4 weeks post op to determine needs    PT Home Exercise Plan Post op shoulder ROM HEP    Consulted and Agree with Plan of Care Patient;Family member/caregiver    Family Member Consulted husband           Patient will benefit from skilled therapeutic intervention in order to improve the following deficits and impairments:  Postural dysfunction,Decreased range of motion,Impaired UE functional use,Pain,Decreased knowledge of precautions  Visit Diagnosis: Malignant neoplasm of upper-outer quadrant of left breast in female, estrogen receptor positive ([place]) - Plan: PT plan of care cert/re-cert  Abnormal posture - Plan: PT plan of care cert/re-cert   Patient will follow up at outpatient cancer rehab 3-4 weeks following surgery.  If the patient requires physical therapy at that time, a specific plan will be dictated and sent to the referring physician for approval. The patient was educated today on appropriate basic range of motion exercises to begin post operatively and the importance of attending the After Breast Cancer class following surgery.  Patient was educated today on lymphedema risk reduction practices as it pertains to recommendations that will benefit the patient immediately following surgery.  She verbalized good understanding.     Problem List Patient Active Problem List   Diagnosis Date Noted  . Malignant neoplasm of upper-outer quadrant of left breast in  female, estrogen receptor positive ([place]) 01/02/2021  . Paroxysmal atrial fibrillation ([place]) 08/19/2019  . CKD (chronic kidney disease) stage 3, GFR 30-59 ml/min (HCC) 08/09/2017  . Hiatal hernia 08/09/2017  . Glaucoma 08/08/2016  . Restrictive lung disease 09/29/2013  . Pulmonary nodule, right 07/14/2012  . Myasthenia gravis ([place]) 07/14/2012  . HERNIA 07/17/2010  . DIVERTICULOSIS, COLON 07/14/2010  . 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 EXTERNAL HEMORRHOIDS 07/13/2009  . GERD 02/16/2009  . Osteoarthrosis, unspecified whether generalized or localized, unspecified site 07/01/2008  . MITRAL VALVE PROLAPSE 11/17/2007  . Hypothyroidism 07/16/2007  . Dyslipidemia 07/16/2007  . Essential hypertension 07/16/2007  . Osteoporosis 07/16/2007   Annia Friendly, PT 01/04/21 4:18 PM  [place]  [place] [address] [place], [place], [address] Phone: [phone]   Fax:  [phone]  Name: Christina Jacobs MRN: [id number] Date of Birth: [birth year]

## 2021-01-05 NOTE — Telephone Encounter (Signed)
Pt Placard  is ready for pick up at the office, left a detailed message for pt to pick up form from the office

## 2021-01-05 NOTE — Telephone Encounter (Signed)
Pt Form has been placed in the front office for pt to pick up, copy sent to scanning

## 2021-01-10 ENCOUNTER — Encounter: Payer: Self-pay | Admitting: *Deleted

## 2021-01-10 ENCOUNTER — Telehealth: Payer: Self-pay | Admitting: *Deleted

## 2021-01-10 NOTE — Telephone Encounter (Signed)
Spoke with patient to follow up from [facility] and assess navigation needs. Denies any questions or concerns at this time.  Encouraged her to call should anything arise. Patient verbalized understanding.

## 2021-01-12 ENCOUNTER — Other Ambulatory Visit: Payer: Self-pay

## 2021-01-12 ENCOUNTER — Inpatient Hospital Stay: Payer: Medicare HMO | Admitting: Hematology and Oncology

## 2021-01-12 ENCOUNTER — Telehealth: Payer: Self-pay | Admitting: *Deleted

## 2021-01-12 ENCOUNTER — Other Ambulatory Visit: Payer: Self-pay | Admitting: *Deleted

## 2021-01-12 VITALS — BP 169/64 | HR 60 | Temp 97.9°F | Resp 17 | Ht 63.0 in | Wt 168.0 lb

## 2021-01-12 DIAGNOSIS — C50412 Malignant neoplasm of upper-outer quadrant of left female breast: Secondary | ICD-10-CM | POA: Diagnosis not present

## 2021-01-12 DIAGNOSIS — Z5111 Encounter for antineoplastic chemotherapy: Secondary | ICD-10-CM | POA: Diagnosis not present

## 2021-01-12 DIAGNOSIS — Z170 Estrogen receptor positive status [ER+]: Secondary | ICD-10-CM | POA: Diagnosis not present

## 2021-01-12 MED ORDER — PROCHLORPERAZINE MALEATE 10 MG PO TABS: 10.0000 mg | ORAL_TABLET | Freq: Four times a day (QID) | ORAL | 1 refills | Status: DC | PRN

## 2021-01-12 MED ORDER — LIDOCAINE-PRILOCAINE 2.5-2.5 % EX CREA: TOPICAL_CREAM | CUTANEOUS | 3 refills | Status: DC

## 2021-01-12 MED ORDER — ONDANSETRON HCL 8 MG PO TABS: 8.0000 mg | ORAL_TABLET | Freq: Two times a day (BID) | ORAL | 1 refills | Status: DC | PRN

## 2021-01-12 MED ORDER — DEXAMETHASONE 4 MG PO TABS: 4.0000 mg | ORAL_TABLET | Freq: Every day | ORAL | 0 refills | Status: DC

## 2021-01-12 NOTE — Progress Notes (Signed)
START ON PATHWAY REGIMEN - Breast     A cycle is every 21 days:     Docetaxel      Cyclophosphamide   **Always confirm dose/schedule in your pharmacy ordering system**  Patient Characteristics: Preoperative or Nonsurgical Candidate (Clinical Staging), Neoadjuvant Therapy followed by Surgery, Invasive Disease, Chemotherapy, HER2 Negative/Unknown/Equivocal, ER Positive Therapeutic Status: Preoperative or Nonsurgical Candidate (Clinical Staging) AJCC M Category: cM0 AJCC Grade: G3 Breast Surgical Plan: Neoadjuvant Therapy followed by Surgery ER Status: Positive (+) AJCC 8 Stage Grouping: IIIA HER2 Status: Negative (-) AJCC T Category: cT2 AJCC N Category: cN1 PR Status: Negative (-) Intent of Therapy: Curative Intent, Discussed with Patient

## 2021-01-12 NOTE — Assessment & Plan Note (Signed)
01/02/2021: Screening mammogram detected left breast mass at 12 o'clock position 2.5 cm spiculated mass.  Abnormal left axillary lymph node 1.3 cm.  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  Neoadjuvant chemotherapy with 4 cycles of Taxotere and Cytoxan 2. followed by breast conserving surgery with targeted lymph node surgery 3.  Adjuvant radiation 4.  Follow-up adjuvant antiestrogen therapy with anastrozole 1 mg daily x7 years  Chemo counseling: I discussed with the patient extensively the risks and benefits of chemotherapy and risks of hair loss, cytopenias, risk of infection, neuropathy risk and bone marrow suppression.  Nausea vomiting diarrhea constipation fatigue LFT abnormalities are all potential risks.  She is agreeable and is willing to proceed.  Return to clinic after port is placed to start chemotherapy.

## 2021-01-12 NOTE — Progress Notes (Signed)
Patient Care Team: Laurey Morale, MD as PCP - General Josue Hector, MD as PCP - Cardiology (Cardiology) Elsie Stain, MD (Pulmonary Disease) [name], MD as Consulting Physician (General Surgery) [name], MD as Consulting Physician (General Surgery) [name], MD as Consulting Physician (Hematology and Oncology) Kyung Rudd, MD as Consulting Physician (Radiation Oncology) Rockwell Germany, RN as Oncology Nurse Navigator Mauro Kaufmann, RN as Oncology Nurse Navigator  DIAGNOSIS:  Encounter Diagnosis  Name Primary?  . Malignant neoplasm of upper-outer quadrant of left breast in female, estrogen receptor positive ([place])     SUMMARY OF ONCOLOGIC HISTORY: Oncology History  Malignant neoplasm of upper-outer quadrant of left breast in female, estrogen receptor positive ([place])  01/02/2021 Initial Diagnosis   Patient experienced chronic left breast tenderness for 2 years with a negative mammogram last year. Screening mammogram showed a 2.5cm central left breast mass and one enlarged lymph node. US showed a 2.6cm mass at the 12 o'clock position and a 1.3cm mass in the left axillary tail. 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  She was detected to have high risk MammaPrint result and she came in today accompanied by her husband to  discuss neoadjuvant chemotherapy treatment plan.   ALLERGIES:  is allergic to codeine, eliquis [apixaban], propoxyphene hcl, meperidine hcl, and tape.  MEDICATIONS:  Current Outpatient Medications  Medication Sig Dispense Refill  . albuterol (VENTOLIN HFA) 108 (90 Base) MCG/ACT inhaler Inhale 2 puffs into the lungs every 4 (four) hours as needed for wheezing or shortness of breath. 18 g 5  . AMBULATORY NON FORMULARY MEDICATION Nitroglycerine ointment 0.125 %  Apply a pea sized amount internally three times daily for 6 - 8 weeks 30 g 0  . atorvastatin (LIPITOR) 10 MG tablet TAKE 1 TABLET BY MOUTH EVERY DAY 90 tablet 3  . flecainide (TAMBOCOR) 50 MG tablet TAKE 1 TABLET BY MOUTH TWICE A DAY 180 tablet 2  . Latanoprost 0.005 % EMUL Apply to eye daily.    Marland Kitchen levothyroxine (SYNTHROID) 100 MCG tablet TAKE 1 TABLET BY MOUTH EVERY DAY 90 tablet 3  . metoprolol succinate (TOPROL-XL) 50 MG 24 hr tablet Take 1 tablet (50 mg total) by mouth daily. Take with or immediately following a meal. 270 tablet 0  . multivitamin-iron-minerals-folic acid (CENTRUM) chewable tablet Chew 1 tablet by mouth daily.    Marland Kitchen omeprazole (PRILOSEC) 40 MG capsule TAKE 1 CAPSULE (40 MG TOTAL) BY MOUTH DAILY AS NEEDED (REFLUX). 90 capsule 3  . predniSONE (DELTASONE) 1 MG tablet Take 4 mg by mouth daily.    . Rivaroxaban (XARELTO) 15 MG TABS tablet TAKE 1 TABLET (15 MG TOTAL) BY MOUTH DAILY WITH SUPPER. 90 tablet 1  . timolol (BETIMOL) 0.5 % ophthalmic solution Place 1 drop into both eyes 2 (two) times daily.    Marland Kitchen triamterene-hydrochlorothiazide (DYAZIDE) 37.5-25 MG capsule  Take 1 each (1 capsule total) by mouth daily. 90 capsule 3   No current facility-administered medications for this visit.    PHYSICAL EXAMINATION: ECOG PERFORMANCE STATUS: 1 - Symptomatic but completely ambulatory  Vitals:   01/12/21 1135  BP: (!) 169/64  Pulse: 60  Resp: 17  Temp: 97.9 F (36.6 C)  SpO2: 97%   Filed Weights   01/12/21 1135   Weight: 168 lb (76.2 kg)      LABORATORY DATA:  I have reviewed the data as listed CMP Latest Ref Rng & Units 01/04/2021 08/23/2020 08/19/2019  Glucose 70 - 99 mg/dL 111(H) 93 91  BUN 8 - 23 mg/dL 17 21 24(H)  Creatinine 0.44 - 1.00 mg/dL 1.37(H) 1.45(H) 1.51(H)  Sodium 135 - 145 mmol/L 141 141 140  Potassium 3.5 - 5.1 mmol/L 3.3(L) 4.1 4.2  Chloride 98 - 111 mmol/L 105 102 101  CO2 22 - 32 mmol/L 28 30 30   Calcium 8.9 - 10.3 mg/dL 9.5 10.0 10.0  Total Protein 6.5 - 8.1 g/dL 6.5 6.6 6.5  Total Bilirubin 0.3 - 1.2 mg/dL 1.3(H) 1.5(H) 1.6(H)  Alkaline Phos 38 - 126 U/L 54 - 55  AST 15 - 41 U/L 20 19 21   ALT 0 - 44 U/L 21 15 18     Lab Results  Component Value Date   WBC 7.7 01/04/2021   HGB 14.6 01/04/2021   HCT 44.4 01/04/2021   MCV 91.9 01/04/2021   PLT 165 01/04/2021   NEUTROABS 4.8 01/04/2021    ASSESSMENT & PLAN:  Malignant neoplasm of upper-outer quadrant of left breast in female, estrogen receptor positive ([place]) 01/02/2021: Screening mammogram detected left breast mass at 12 o'clock position 2.5 cm spiculated mass.  Abnormal left axillary lymph node 1.3 cm.  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  Neoadjuvant chemotherapy with 4 cycles of Taxotere and Cytoxan 2. followed by breast conserving surgery with targeted lymph node surgery 3.  Adjuvant radiation 4.  Follow-up adjuvant antiestrogen therapy with anastrozole 1 mg daily x7 years  Chemo counseling: I discussed with the patient extensively the risks and benefits of chemotherapy and risks of hair loss, cytopenias, risk of infection, neuropathy risk and bone marrow suppression.  Nausea vomiting diarrhea constipation fatigue LFT abnormalities are all potential risks.  She is agreeable and is willing to proceed.  Return to clinic  after port is placed to start chemotherapy.    No orders of the defined types were placed in this encounter.  The patient has a good understanding of the overall plan. she agrees with it. she will call with any problems that may develop before the next visit here. Total time spent: 30 mins including face to face time and time spent for planning, charting and co-ordination of care   Harriette Ohara, MD 01/12/21

## 2021-01-12 NOTE — Telephone Encounter (Signed)
Received mammaprint results of high risk.  Patient is aware. Confirmed appointment for 01/12/21 at 1130.

## 2021-01-13 ENCOUNTER — Other Ambulatory Visit: Payer: Self-pay | Admitting: General Surgery

## 2021-01-13 ENCOUNTER — Telehealth: Payer: Self-pay | Admitting: Hematology and Oncology

## 2021-01-13 ENCOUNTER — Telehealth: Payer: Self-pay | Admitting: *Deleted

## 2021-01-13 ENCOUNTER — Encounter: Payer: Self-pay | Admitting: Hematology and Oncology

## 2021-01-13 NOTE — Telephone Encounter (Signed)
   [facility] Pre-operative Risk Assessment    HEARTCARE STAFF: - Please ensure there is not already an duplicate clearance open for this procedure. - Under Visit Info/Reason for Call, type in Other and utilize the format Clearance MM/DD/YY or Clearance TBD. Do not use dashes or single digits. - If request is for dental extraction, please clarify the # of teeth to be extracted.  Request for surgical clearance: URGENT   1. What type of surgery is being performed? BREAST CANCER-PORT PLACEMENT FOR CHEMO   2. When is this surgery scheduled? TBD (URGENT)   3. What type of clearance is required (medical clearance vs. Pharmacy clearance to hold med vs. Both)? BOTH  4. Are there any medications that need to be held prior to surgery and how long? [place]   5. Practice name and name of physician performing surgery? [facility]; DR. Rodman [place]   6. What is the office phone number? [phone]   7.   What is the office fax number? [place], CMA  8.   Anesthesia type (None, local, MAC, general) ? GENERAL   Julaine Hua 01/13/2021, 11:32 AM  _________________________________________________________________   (provider comments below)

## 2021-01-13 NOTE — Telephone Encounter (Signed)
Scheduled appt per 3/10 sch msg. Pt aware.  

## 2021-01-13 NOTE — Telephone Encounter (Signed)
Patient with diagnosis of afib on Xarelto for anticoagulation.    Procedure: BREAST CANCER-PORT PLACEMENT FOR CHEMO   Date of procedure: TBD - ASAP  CHA2DS2-VASc Score = 4  This indicates a 4.8% annual risk of stroke. The patient's score is based upon: CHF History: No HTN History: Yes Diabetes History: No Stroke History: No Vascular Disease History: No Age Score: 2 Gender Score: 1  CrCl 99mL/min Platelet count 165K  Per office protocol, patient can hold Xarelto for 2 days prior to procedure.

## 2021-01-13 NOTE — Telephone Encounter (Signed)
   Primary Cardiologist: Jenkins Rouge, MD  Chart reviewed as part of pre-operative protocol coverage. Given past medical history and time since last visit, based on ACC/AHA guidelines, Christina Jacobs would be at acceptable risk for the planned procedure without further cardiovascular testing.   Patient with diagnosis of afib on Xarelto for anticoagulation.    Procedure: BREAST CANCER-PORT PLACEMENT FOR CHEMO  Date of procedure: TBD - ASAP  CHA2DS2-VASc Score = 4  This indicates a 4.8% annual risk of stroke. The patient's score is based upon: CHF History: No HTN History: Yes Diabetes History: No Stroke History: No Vascular Disease History: No Age Score: 2 Gender Score: 1  CrCl 26mL/min Platelet count 165K  Per office protocol, patient can hold Xarelto for 2 days prior to procedure.  Patient was advised that if she develops new symptoms prior to surgery to contact our office to arrange a follow-up appointment.  He verbalized understanding.  I will route this recommendation to the requesting party via Epic fax function and remove from pre-op pool.  Please call with questions.  Jossie Ng. Treyton Slimp NP-C    01/13/2021, 3:03 PM [facility] [place] [address] Office [phone] Fax [phone]

## 2021-01-16 ENCOUNTER — Encounter: Payer: Self-pay | Admitting: *Deleted

## 2021-01-16 ENCOUNTER — Telehealth: Payer: Self-pay | Admitting: Emergency Medicine

## 2021-01-16 NOTE — Telephone Encounter (Signed)
Chair Exercise Intervention During Chemotherapy  Called patient to introduce the chair exercise study.  Advised patient that this study is looking to determine if a 12 week home based chair exercise regimen is effective in managing symptoms of fatigue.  Patient stated that she would consider participating and discuss it with her husband.  She was given this coordinator's phone number and urged to call with any questions.  If the patient is interested in participating, will consider meeting to review the consent in person before or after her patient education class on Friday.  Clabe Seal Clinical Research Coordinator I  01/16/21  2:18 PM

## 2021-01-17 ENCOUNTER — Encounter: Payer: Self-pay | Admitting: Dietician

## 2021-01-17 NOTE — Progress Notes (Signed)
Nutrition  Patient identified after attending Breast Clinic on 01/04/21. Patient given nutrition packet with RD contact information by nurse navigator at that time.  Chart reviewed.   Patient with newly diagnosed malignant neoplasm of upper-outer quadrant of left breast in female, estrogen receptor positive. She is followed by Dr. Lindi Adie.  Plans for neoadjuvant chemotherapy followed by breast conserving surgery, adjuvant radiation followed by adjuvant antiestrogen therapy.   Current wt 168 lb (76.2 kg) Weight history reviewed, stable 166-172 lbs over the last 3 years. Pt denies nausea, change in bowel habits, mucositis or sore throat per 3/2 MD note.  Patient is not currently at nutrition risk. Please consult RD if nutrition issues arise.

## 2021-01-19 NOTE — Progress Notes (Signed)
Pharmacist Chemotherapy Monitoring - Initial Assessment    Anticipated start date: 01/26/2021   Regimen:  . Are orders appropriate based on the patient's diagnosis, regimen, and cycle? Yes . Does the plan date match the patient's scheduled date? Yes . Is the sequencing of drugs appropriate? Yes . Are the premedications appropriate for the patient's regimen? Yes . Prior Authorization for treatment is: Pending o If applicable, is the correct biosimilar selected based on the patient's insurance? not applicable  Organ Function and Labs: Marland Kitchen Are dose adjustments needed based on the patient's renal function, hepatic function, or hematologic function? Yes . Are appropriate labs ordered prior to the start of patient's treatment? Yes . Other organ system assessment, if indicated: N/A . The following baseline labs, if indicated, have been ordered: N/A  Dose Assessment: . Are the drug doses appropriate? Yes . Are the following correct: o Drug concentrations Yes o IV fluid compatible with drug Yes o Administration routes Yes o Timing of therapy Yes . If applicable, does the patient have documented access for treatment and/or plans for port-a-cath placement? not applicable . If applicable, have lifetime cumulative doses been properly documented and assessed? yes Lifetime Dose Tracking  No doses have been documented on this patient for the following tracked chemicals: Doxorubicin, Epirubicin, Idarubicin, Daunorubicin, Mitoxantrone, Bleomycin, Oxaliplatin, Carboplatin, Liposomal Doxorubicin  o   Toxicity Monitoring/Prevention: . The patient has the following take home antiemetics prescribed: Ondansetron, Prochlorperazine and Dexamethasone . The patient has the following take home medications prescribed: N/A . Medication allergies and previous infusion related reactions, if applicable, have been reviewed and addressed. No . The patient's current medication list has been assessed for drug-drug  interactions with their chemotherapy regimen. no significant drug-drug interactions were identified on review.  Order Review: . Are the treatment plan orders signed? Yes . Is the patient scheduled to see a provider prior to their treatment? Yes  I verify that I have reviewed each item in the above checklist and answered each question accordingly.  Larene Beach, [place], 01/19/2021  4:29 PM

## 2021-01-19 NOTE — Progress Notes (Signed)
The following biosimilar Udenyca ([id number]) has been selected for use in this patient per insurance.  Henreitta Leber, PharmD 01/19/21 @ 1045

## 2021-01-19 NOTE — Patient Instructions (Addendum)
DUE TO COVID-19 ONLY ONE VISITOR IS ALLOWED TO COME WITH YOU AND STAY IN THE WAITING ROOM ONLY DURING PRE OP AND PROCEDURE DAY OF SURGERY. THE 1 VISITOR  MAY VISIT WITH YOU AFTER SURGERY IN YOUR PRIVATE ROOM DURING VISITING HOURS ONLY!  YOU NEED TO HAVE A COVID 19 TEST ON: 01/21/21 , THIS TEST MUST BE DONE BEFORE SURGERY,  COVID TESTING SITE [address] [place] [id number], IT IS ON THE RIGHT GOING OUT [address] APPROXIMATELY  2 MINUTES PAST [employer] ON THE RIGHT. ONCE YOUR COVID TEST IS COMPLETED,  PLEASE BEGIN THE QUARANTINE INSTRUCTIONS AS OUTLINED IN YOUR HANDOUT.                Christina Jacobs    Your procedure is scheduled on: 01/25/21   Report to [facility] Main  Entrance   Report to admitting at: 6:30 AM     Call this number if you have problems the morning of surgery [phone]    Remember:  NO SOLID FOOD AFTER MIDNIGHT THE NIGHT PRIOR TO SURGERY. NOTHING BY MOUTH EXCEPT CLEAR LIQUIDS UNTIL: 5:30 AM . PLEASE FINISH ENSURE DRINK PER SURGEON ORDER  WHICH NEEDS TO BE COMPLETED AT: 5:30 AM .  CLEAR LIQUID DIET  Foods Allowed                                                                     Foods Excluded  Coffee and tea, regular and decaf                             liquids that you cannot  Plain Jell-O any favor except red or purple                                           see through such as: Fruit ices (not with fruit pulp)                                     milk, soups, orange juice  Iced Popsicles                                    All solid food Carbonated beverages, regular and diet                                    Cranberry, grape and apple juices Sports drinks like Gatorade Lightly seasoned clear broth or consume(fat free) Sugar, honey syrup  Sample Menu Breakfast                                Lunch  Supper Cranberry juice                    Beef broth                             Chicken broth Jell-O                                     Grape juice                           Apple juice Coffee or tea                        Jell-O                                      Popsicle                                                Coffee or tea                        Coffee or tea  _____________________________________________________________________   BRUSH YOUR TEETH MORNING OF SURGERY AND RINSE YOUR MOUTH OUT, NO CHEWING GUM CANDY OR MINTS.    Take these medicines the morning of surgery with A SIP OF WATER: flecainide(tambocor),levothyrosine,metoprolol,omeprazole,prednisone.Use inhalers and eye drops as usual.                               You may not have any metal on your body including hair pins and              piercings  Do not wear jewelry, make-up, lotions, powders or perfumes, deodorant             Do not wear nail polish on your fingernails.  Do not shave  48 hours prior to surgery.    Do not bring valuables to the hospital. [place].  Contacts, dentures or bridgework may not be worn into surgery.  Leave suitcase in the car. After surgery it may be brought to your room.     Patients discharged the day of surgery will not be allowed to drive home. IF YOU ARE HAVING SURGERY AND GOING HOME THE SAME DAY, YOU MUST HAVE AN ADULT TO DRIVE YOU HOME AND BE WITH YOU FOR 24 HOURS. YOU MAY GO HOME BY TAXI OR UBER OR ORTHERWISE, BUT AN ADULT MUST ACCOMPANY YOU HOME AND STAY WITH YOU FOR 24 HOURS.  Name and phone number of your driver:  Special Instructions: N/A              Please read over the following fact sheets you were given: _____________________________________________________________________        [facility] - Preparing for Surgery Before surgery, you can play an important role.  Because skin is not sterile, your skin needs to be as free of germs  as possible.  You can reduce the number of germs on your skin by  washing with CHG (chlorahexidine gluconate) soap before surgery.  CHG is an antiseptic cleaner which kills germs and bonds with the skin to continue killing germs even after washing. Please DO NOT use if you have an allergy to CHG or antibacterial soaps.  If your skin becomes reddened/irritated stop using the CHG and inform your nurse when you arrive at Short Stay. Do not shave (including legs and underarms) for at least 48 hours prior to the first CHG shower.  You may shave your face/neck. Please follow these instructions carefully:  1.  Shower with CHG Soap the night before surgery and the  morning of Surgery.  2.  If you choose to wash your hair, wash your hair first as usual with your  normal  shampoo.  3.  After you shampoo, rinse your hair and body thoroughly to remove the  shampoo.                           4.  Use CHG as you would any other liquid soap.  You can apply chg directly  to the skin and wash                       Gently with a scrungie or clean washcloth.  5.  Apply the CHG Soap to your body ONLY FROM THE NECK DOWN.   Do not use on face/ open                           Wound or open sores. Avoid contact with eyes, ears mouth and genitals (private parts).                       Wash face,  Genitals (private parts) with your normal soap.             6.  Wash thoroughly, paying special attention to the area where your surgery  will be performed.  7.  Thoroughly rinse your body with warm water from the neck down.  8.  DO NOT shower/wash with your normal soap after using and rinsing off  the CHG Soap.                9.  Pat yourself dry with a clean towel.            10.  Wear clean pajamas.            11.  Place clean sheets on your bed the night of your first shower and do not  sleep with pets. Day of Surgery : Do not apply any lotions/deodorants the morning of surgery.  Please wear clean clothes to the hospital/surgery center.  FAILURE TO FOLLOW THESE INSTRUCTIONS MAY RESULT IN THE  CANCELLATION OF YOUR SURGERY PATIENT SIGNATURE_________________________________  NURSE SIGNATURE__________________________________  ________________________________________________________________________

## 2021-01-20 ENCOUNTER — Other Ambulatory Visit: Payer: Self-pay

## 2021-01-20 ENCOUNTER — Encounter: Payer: Self-pay | Admitting: Hematology and Oncology

## 2021-01-20 ENCOUNTER — Inpatient Hospital Stay: Payer: Medicare HMO

## 2021-01-21 ENCOUNTER — Other Ambulatory Visit (HOSPITAL_COMMUNITY)
Admission: RE | Admit: 2021-01-21 | Discharge: 2021-01-21 | Disposition: A | Discharge status: Home / Self Care | Payer: Medicare HMO | Source: Ambulatory Visit | Attending: General Surgery | Admitting: General Surgery

## 2021-01-21 DIAGNOSIS — Z01812 Encounter for preprocedural laboratory examination: Secondary | ICD-10-CM | POA: Insufficient documentation

## 2021-01-21 DIAGNOSIS — Z20822 Contact with and (suspected) exposure to covid-19: Secondary | ICD-10-CM | POA: Insufficient documentation

## 2021-01-21 LAB — SARS CORONAVIRUS 2 (TAT 6-24 HRS): SARS Coronavirus 2: NEGATIVE

## 2021-01-23 ENCOUNTER — Encounter: Payer: Self-pay | Admitting: *Deleted

## 2021-01-23 ENCOUNTER — Other Ambulatory Visit: Payer: Self-pay

## 2021-01-23 ENCOUNTER — Encounter (HOSPITAL_COMMUNITY): Payer: Self-pay

## 2021-01-23 ENCOUNTER — Encounter (HOSPITAL_COMMUNITY)
Admission: RE | Admit: 2021-01-23 | Discharge: 2021-01-23 | Disposition: A | Discharge status: Home / Self Care | Payer: Medicare HMO | Source: Ambulatory Visit | Attending: General Surgery | Admitting: General Surgery

## 2021-01-23 DIAGNOSIS — Z7901 Long term (current) use of anticoagulants: Secondary | ICD-10-CM | POA: Insufficient documentation

## 2021-01-23 DIAGNOSIS — G7000 Myasthenia gravis without (acute) exacerbation: Secondary | ICD-10-CM | POA: Diagnosis not present

## 2021-01-23 DIAGNOSIS — C50919 Malignant neoplasm of unspecified site of unspecified female breast: Secondary | ICD-10-CM | POA: Insufficient documentation

## 2021-01-23 DIAGNOSIS — Z79890 Hormone replacement therapy (postmenopausal): Secondary | ICD-10-CM | POA: Diagnosis not present

## 2021-01-23 DIAGNOSIS — Z01818 Encounter for other preprocedural examination: Secondary | ICD-10-CM | POA: Insufficient documentation

## 2021-01-23 DIAGNOSIS — I10 Essential (primary) hypertension: Secondary | ICD-10-CM | POA: Diagnosis not present

## 2021-01-23 DIAGNOSIS — J45909 Unspecified asthma, uncomplicated: Secondary | ICD-10-CM | POA: Insufficient documentation

## 2021-01-23 DIAGNOSIS — Z79899 Other long term (current) drug therapy: Secondary | ICD-10-CM | POA: Diagnosis not present

## 2021-01-23 HISTORY — DX: Cardiac arrhythmia, unspecified: I49.9

## 2021-01-23 HISTORY — DX: Dyspnea, unspecified: R06.00

## 2021-01-23 HISTORY — DX: Anxiety disorder, unspecified: F41.9

## 2021-01-23 LAB — BASIC METABOLIC PANEL
Anion gap: 6 (ref 5–15)
BUN: 21 mg/dL (ref 8–23)
CO2: 29 mmol/L (ref 22–32)
Calcium: 9.5 mg/dL (ref 8.9–10.3)
Chloride: 106 mmol/L (ref 98–111)
Creatinine, Ser: 1.37 mg/dL — ABNORMAL HIGH (ref 0.44–1.00)
GFR, Estimated: 39 mL/min — ABNORMAL LOW (ref >60)
Glucose, Bld: 107 mg/dL — ABNORMAL HIGH (ref 70–99)
Potassium: 3.5 mmol/L (ref 3.5–5.1)
Sodium: 141 mmol/L (ref 135–145)

## 2021-01-23 LAB — CBC
HCT: 46.7 % — ABNORMAL HIGH (ref 36.0–46.0)
Hemoglobin: 15.2 g/dL — ABNORMAL HIGH (ref 12.0–15.0)
MCH: 30.8 pg (ref 26.0–34.0)
MCHC: 32.5 g/dL (ref 30.0–36.0)
MCV: 94.5 fL (ref 80.0–100.0)
Platelets: 182 10*3/uL (ref 150–400)
RBC: 4.94 MIL/uL (ref 3.87–5.11)
RDW: 13.5 % (ref 11.5–15.5)
WBC: 8.1 10*3/uL (ref 4.0–10.5)
nRBC: 0 % (ref 0.0–0.2)

## 2021-01-23 NOTE — Progress Notes (Signed)
COVID Vaccine Completed: Yes Date COVID Vaccine completed: 11/2020. [place] COVID vaccine manufacturer: Pfizer     PCP - Dr. Alysia Penna Cardiologist - Dr. Jenkins Rouge. Clearance: Coletta Memos: NP. : 01/13/21: EPIC  Chest x-ray -  EKG -  Stress Test -  ECHO -  Cardiac Cath -  Pacemaker/ICD device last checked:  Sleep Study -  CPAP -   Fasting Blood Sugar -  Checks Blood Sugar _____ times a day  Blood Thinner Instructions: Xarelto can be hold for 2 days before surgery as per Coletta Memos: NP. Aspirin Instructions: Last Dose:  Anesthesia review: Hx: HTN,Afib,,CKD III.  Patient denies shortness of breath, fever, cough and chest pain at PAT appointment   Patient verbalized understanding of instructions that were given to them at the PAT appointment. Patient was also instructed that they will need to review over the PAT instructions again at home before surgery.

## 2021-01-24 ENCOUNTER — Other Ambulatory Visit: Payer: Self-pay | Admitting: Cardiovascular Disease

## 2021-01-24 NOTE — Anesthesia Preprocedure Evaluation (Addendum)
Anesthesia Evaluation  Patient identified by MRN, date of birth, ID band Patient awake    Reviewed: Allergy & Precautions, NPO status , Patient's Chart, lab work & pertinent test results  History of Anesthesia Complications Negative for: history of anesthetic complications  Airway Mallampati: II  TM Distance: >3 FB Neck ROM: Full    Dental  (+) Teeth Intact   Pulmonary asthma ,    Pulmonary exam normal        Cardiovascular hypertension, Pt. on home beta blockers and Pt. on medications Normal cardiovascular exam+ dysrhythmias Atrial Fibrillation      Neuro/Psych  Neuromuscular disease (myasthenia gravis; diplopia only; treated with prednisone)    GI/Hepatic Neg liver ROS, hiatal hernia, GERD  Medicated,  Endo/Other  Hypothyroidism Chronic prednisone  Renal/GU Renal InsufficiencyRenal disease (CKDIII; Cr 1.37)  negative genitourinary   Musculoskeletal  (+) Arthritis ,   Abdominal   Peds  Hematology Xarelto   Anesthesia Other Findings  Per cardiology preoperative evaluation 01/13/21, "Chart reviewed as part of pre-operative protocol coverage. Given past medical history and time since last visit, based on ACC/AHA guidelines, Christina Jacobs would be at acceptable risk for the planned procedure without further cardiovascular testing.   Reproductive/Obstetrics                           Anesthesia Physical Anesthesia Plan  ASA: III  Anesthesia Plan: General   Post-op Pain Management:    Induction: Intravenous  PONV Risk Score and Plan: 3 and Ondansetron, Dexamethasone and Treatment may vary due to age or medical condition  Airway Management Planned: LMA  Additional Equipment: None  Intra-op Plan:   Post-operative Plan: Extubation in OR  Informed Consent: I have reviewed the patients History and Physical, chart, labs and discussed the procedure including the risks, benefits and  alternatives for the proposed anesthesia with the patient or authorized representative who has indicated his/her understanding and acceptance.     Dental advisory given  Plan Discussed with:   Anesthesia Plan Comments: (Decadron for stress-dose steroids.)      Anesthesia Quick Evaluation

## 2021-01-24 NOTE — Progress Notes (Signed)
Anesthesia Chart Review   Case: [id number] Date/Time: 01/25/21 0815   Procedure: INSERTION PORT-A-CATH (N/A Breast) - START TIME OF 8:30 AM FOR 60 MINUTES ROOM 4 [place] [facility]   Anesthesia type: General   Pre-op diagnosis: BREAST CANCER   Location: [facility] ROOM 04 / [facility]   Surgeons: [name], MD      DISCUSSION:79 y.o. never smoker with h/o HTN, asthma, CKD Stage III, a-fib (Xarelto), myasthenia gravis (diplopia), breast cancer scheduled for above procedure 01/25/2021 with Dr. [name].   Per cardiology preoperative evaluation 01/13/21, "Chart reviewed as part of pre-operative protocol coverage. Given past medical history and time since last visit, based on ACC/AHA guidelines, Christina Jacobs would be at acceptable risk for the planned procedure without further cardiovascular testing.   Patient with diagnosis ofafibon Xareltofor anticoagulation.   Procedure:BREAST CANCER-PORT PLACEMENT FOR CHEMO  Date of procedure:TBD - ASAP  CHA2DS2-VAScScore = 4 This indicates a4.8% annual risk of stroke. The patient's score is based upon: CHF History: No HTN History: Yes Diabetes History: No Stroke History: No Vascular Disease History: No Age Score: 2 Gender Score: 1  CrCl22mL/min Platelet count165K  Per office protocol, patient can holdXareltofor 2days prior to procedure."  Anticipate pt can proceed with planned procedure barring acute status change.   VS: BP (!) 143/99   Pulse 64   Temp 36.4 C (Oral)   Ht 5\' 3"  (1.6 m)   Wt 73.9 kg   SpO2 97%   BMI 28.87 kg/m   PROVIDERS: Laurey Morale, MD  Jenkins Rouge, MD is Cardiologist  LABS: Labs reviewed: Acceptable for surgery. (all labs ordered are listed, but only abnormal results are displayed)  Labs Reviewed  BASIC METABOLIC PANEL - Abnormal; Notable for the following components:      Result Value   Glucose, Bld 107 (*)    Creatinine, Ser 1.37 (*)    GFR, Estimated 39 (*)    All other  components within normal limits  CBC - Abnormal; Notable for the following components:   Hemoglobin 15.2 (*)    HCT 46.7 (*)    All other components within normal limits     IMAGES:   EKG: 01/23/2021 Rate 61 bpm  NSR Nonspecific ST abnormality Abnormal ECG  CV: Echo 12/17/2017 Study Conclusions   - Left ventricle: The cavity size was normal. There was mild focal  basal hypertrophy of the septum. Systolic function was normal.  The estimated ejection fraction was in the range of 55% to 60%.  The study was not technically sufficient to allow evaluation of  LV diastolic dysfunction due to atrial fibrillation.  - Pulmonary arteries: PA peak pressure: 33 mm Hg (S).  Past Medical History:  Diagnosis Date  . Adenomatous colon polyp   . Anal fissure   . Anxiety   . Asthma    Dr. Halford Chessman  . Atrial fibrillation ([place])   . Bowel obstruction ([place])   . Bursitis   . CKD (chronic kidney disease) stage 3, GFR 30-59 ml/min (HCC)    sees Dr. [name]   . Complication of anesthesia    post op N/V  . Diverticulosis   . Dyspnea    After a meal  . Dysrhythmia    Afib  . Glaucoma   . Hiatal hernia    sees Dr. [name] at [facility]   . Hyperlipidemia   . Hypertension   . Hypothyroidism   . Myasthenia gravis ([place]) 2013   followed at [facility] Dr Thayer Headings  [place], diplopia only  . Osteoarthritis    see's Dr. [name]  . Osteoporosis    last DEXA 08-03-11    Past Surgical History:  Procedure Laterality Date  . ABDOMINAL HYSTERECTOMY    . APPENDECTOMY    . BREAST LUMPECTOMY Bilateral     several, enign breast lumps removed  . CARDIOVERSION N/A 12/31/2017   Procedure: CARDIOVERSION;  Surgeon: Josue Hector, MD;  Location: [facility] ENDOSCOPY;  Service: Cardiovascular;  Laterality: N/A;  . CARDIOVERSION N/A 02/11/2018   Procedure: CARDIOVERSION;  Surgeon: Josue Hector, MD;  Location: [facility] ENDOSCOPY;  Service: Cardiovascular;  Laterality: N/A;  . COLONOSCOPY  09/27/2010   per  Dr. Sharlett Iles, benign polyp, no repeats needed   . HERNIA REPAIR  10-18-10   Dr. Hassell Done attempted but could reduce this, the entire stomach is above the diaphragm  . THYROIDECTOMY, PARTIAL  1999  . TONSILLECTOMY      MEDICATIONS: . albuterol (VENTOLIN HFA) 108 (90 Base) MCG/ACT inhaler  . AMBULATORY NON FORMULARY MEDICATION  . atorvastatin (LIPITOR) 10 MG tablet  . dexamethasone (DECADRON) 4 MG tablet  . flecainide (TAMBOCOR) 50 MG tablet  . latanoprost (XALATAN) 0.005 % ophthalmic solution  . levothyroxine (SYNTHROID) 100 MCG tablet  . lidocaine-prilocaine (EMLA) cream  . metoprolol succinate (TOPROL-XL) 50 MG 24 hr tablet  . multivitamin-iron-minerals-folic acid (CENTRUM) chewable tablet  . omeprazole (PRILOSEC) 40 MG capsule  . ondansetron (ZOFRAN) 8 MG tablet  . prochlorperazine (COMPAZINE) 10 MG tablet  . Rivaroxaban (XARELTO) 15 MG TABS tablet  . timolol (BETIMOL) 0.5 % ophthalmic solution  . triamterene-hydrochlorothiazide (DYAZIDE) 37.5-25 MG capsule   No current facility-administered medications for this encounter.     Konrad Felix, PA-C [facility] [phone]

## 2021-01-25 ENCOUNTER — Telehealth: Payer: Self-pay | Admitting: Hematology and Oncology

## 2021-01-25 ENCOUNTER — Ambulatory Visit (HOSPITAL_COMMUNITY)
Admission: RE | Admit: 2021-01-25 | Discharge: 2021-01-25 | Disposition: A | Discharge status: Home / Self Care | Payer: Medicare HMO | Attending: General Surgery | Admitting: General Surgery

## 2021-01-25 ENCOUNTER — Encounter: Payer: Self-pay | Admitting: Hematology and Oncology

## 2021-01-25 ENCOUNTER — Ambulatory Visit (HOSPITAL_COMMUNITY): Payer: Medicare HMO | Admitting: Certified Registered Nurse Anesthetist

## 2021-01-25 ENCOUNTER — Ambulatory Visit (HOSPITAL_COMMUNITY): Payer: Medicare HMO

## 2021-01-25 ENCOUNTER — Encounter (HOSPITAL_COMMUNITY)
Admission: RE | Disposition: A | Discharge status: Home / Self Care | Payer: Self-pay | Source: Home / Self Care | Attending: General Surgery

## 2021-01-25 ENCOUNTER — Ambulatory Visit (HOSPITAL_COMMUNITY): Payer: Medicare HMO | Admitting: Physician Assistant

## 2021-01-25 ENCOUNTER — Encounter (HOSPITAL_COMMUNITY): Payer: Self-pay | Admitting: General Surgery

## 2021-01-25 DIAGNOSIS — C50912 Malignant neoplasm of unspecified site of left female breast: Secondary | ICD-10-CM | POA: Diagnosis not present

## 2021-01-25 DIAGNOSIS — Z170 Estrogen receptor positive status [ER+]: Secondary | ICD-10-CM

## 2021-01-25 DIAGNOSIS — C50412 Malignant neoplasm of upper-outer quadrant of left female breast: Secondary | ICD-10-CM

## 2021-01-25 HISTORY — PX: PORTACATH PLACEMENT: SHX2246

## 2021-01-25 SURGERY — INSERTION, TUNNELED CENTRAL VENOUS DEVICE, WITH PORT
Anesthesia: General | Site: Neck | Laterality: Right

## 2021-01-25 MED ORDER — HEPARIN SOD (PORK) LOCK FLUSH 100 UNIT/ML IV SOLN: INTRAVENOUS | Status: DC | PRN

## 2021-01-25 MED ORDER — ONDANSETRON HCL 4 MG/2ML IJ SOLN
INTRAMUSCULAR | Status: DC | PRN
  Administered 2021-01-25: 4 mg via INTRAVENOUS

## 2021-01-25 MED ORDER — PHENYLEPHRINE 40 MCG/ML (10ML) SYRINGE FOR IV PUSH (FOR BLOOD PRESSURE SUPPORT)
PREFILLED_SYRINGE | INTRAVENOUS | Status: DC | PRN
  Administered 2021-01-25: 120 ug via INTRAVENOUS

## 2021-01-25 MED ORDER — PROPOFOL 10 MG/ML IV BOLUS
INTRAVENOUS | Status: DC | PRN
  Administered 2021-01-25: 120 mg via INTRAVENOUS

## 2021-01-25 MED ORDER — PROPOFOL 10 MG/ML IV BOLUS
INTRAVENOUS | Status: AC
  Filled 2021-01-25: qty 20

## 2021-01-25 MED ORDER — EPHEDRINE SULFATE-NACL 50-0.9 MG/10ML-% IV SOSY
PREFILLED_SYRINGE | INTRAVENOUS | Status: DC | PRN
  Administered 2021-01-25 (×4): 10 mg via INTRAVENOUS

## 2021-01-25 MED ORDER — METOPROLOL SUCCINATE ER 50 MG PO TB24
50.0000 mg | ORAL_TABLET | Freq: Once | ORAL | Status: AC
  Administered 2021-01-25: 50 mg via ORAL
  Filled 2021-01-25: qty 1

## 2021-01-25 MED ORDER — CEFAZOLIN SODIUM-DEXTROSE 2-4 GM/100ML-% IV SOLN
2.0000 g | INTRAVENOUS | Status: AC
  Administered 2021-01-25: 2 g via INTRAVENOUS
  Filled 2021-01-25: qty 100

## 2021-01-25 MED ORDER — PHENYLEPHRINE 40 MCG/ML (10ML) SYRINGE FOR IV PUSH (FOR BLOOD PRESSURE SUPPORT)
PREFILLED_SYRINGE | INTRAVENOUS | Status: AC
  Filled 2021-01-25: qty 10

## 2021-01-25 MED ORDER — ENSURE PRE-SURGERY PO LIQD
296.0000 mL | Freq: Once | ORAL | Status: DC
  Filled 2021-01-25: qty 296

## 2021-01-25 MED ORDER — SODIUM CHLORIDE 0.9 % IV SOLN
Freq: Once | INTRAVENOUS | Status: AC
  Administered 2021-01-25: 500 mL
  Filled 2021-01-25: qty 1.2

## 2021-01-25 MED ORDER — ONDANSETRON HCL 4 MG/2ML IJ SOLN
INTRAMUSCULAR | Status: AC
  Filled 2021-01-25: qty 2

## 2021-01-25 MED ORDER — DEXAMETHASONE SODIUM PHOSPHATE 10 MG/ML IJ SOLN
INTRAMUSCULAR | Status: AC
  Filled 2021-01-25: qty 1

## 2021-01-25 MED ORDER — BUPIVACAINE-EPINEPHRINE 0.25% -1:200000 IJ SOLN
INTRAMUSCULAR | Status: DC | PRN
  Administered 2021-01-25: 6 mL

## 2021-01-25 MED ORDER — LIDOCAINE 2% (20 MG/ML) 5 ML SYRINGE
INTRAMUSCULAR | Status: DC | PRN
  Administered 2021-01-25: 60 mg via INTRAVENOUS

## 2021-01-25 MED ORDER — DEXAMETHASONE SODIUM PHOSPHATE 10 MG/ML IJ SOLN
INTRAMUSCULAR | Status: DC | PRN
  Administered 2021-01-25: 10 mg via INTRAVENOUS

## 2021-01-25 MED ORDER — ORAL CARE MOUTH RINSE
15.0000 mL | Freq: Once | OROMUCOSAL | Status: AC
  Administered 2021-01-25: 15 mL via OROMUCOSAL

## 2021-01-25 MED ORDER — LIDOCAINE 2% (20 MG/ML) 5 ML SYRINGE
INTRAMUSCULAR | Status: AC
  Filled 2021-01-25: qty 5

## 2021-01-25 MED ORDER — CHLORHEXIDINE GLUCONATE 0.12 % MT SOLN: 15.0000 mL | Freq: Once | OROMUCOSAL | Status: AC

## 2021-01-25 MED ORDER — BUPIVACAINE-EPINEPHRINE (PF) 0.25% -1:200000 IJ SOLN
INTRAMUSCULAR | Status: AC
  Filled 2021-01-25: qty 30

## 2021-01-25 MED ORDER — HEPARIN SOD (PORK) LOCK FLUSH 100 UNIT/ML IV SOLN
INTRAVENOUS | Status: AC
  Filled 2021-01-25: qty 5

## 2021-01-25 MED ORDER — EPHEDRINE 5 MG/ML INJ
INTRAVENOUS | Status: AC
  Filled 2021-01-25: qty 20

## 2021-01-25 MED ORDER — LACTATED RINGERS IV SOLN: INTRAVENOUS | Status: DC

## 2021-01-25 MED ORDER — ACETAMINOPHEN 500 MG PO TABS
1000.0000 mg | ORAL_TABLET | ORAL | Status: AC
  Administered 2021-01-25: 1000 mg via ORAL
  Filled 2021-01-25: qty 2

## 2021-01-25 SURGICAL SUPPLY — 42 items
ADH SKN CLS APL DERMABOND .7 (GAUZE/BANDAGES/DRESSINGS) ×1
APL PRP STRL LF DISP 70% ISPRP (MISCELLANEOUS) ×1
APL SKNCLS STERI-STRIP NONHPOA (GAUZE/BANDAGES/DRESSINGS)
BAG DECANTER FOR FLEXI CONT (MISCELLANEOUS) ×2 IMPLANT
BENZOIN TINCTURE PRP APPL 2/3 (GAUZE/BANDAGES/DRESSINGS) IMPLANT
BLADE SURG 15 STRL LF DISP TIS (BLADE) ×1 IMPLANT
BLADE SURG 15 STRL SS (BLADE) ×2
BLADE SURG SZ11 CARB STEEL (BLADE) ×2 IMPLANT
CHLORAPREP W/TINT 26 (MISCELLANEOUS) ×2 IMPLANT
COVER SURGICAL LIGHT HANDLE (MISCELLANEOUS) ×2 IMPLANT
COVER WAND RF STERILE (DRAPES) IMPLANT
DECANTER SPIKE VIAL GLASS SM (MISCELLANEOUS) ×2 IMPLANT
DERMABOND ADVANCED (GAUZE/BANDAGES/DRESSINGS) ×1
DERMABOND ADVANCED .7 DNX12 (GAUZE/BANDAGES/DRESSINGS) ×1 IMPLANT
DRAPE C-ARM 42X120 X-RAY (DRAPES) ×2 IMPLANT
DRAPE LAPAROSCOPIC ABDOMINAL (DRAPES) ×2 IMPLANT
DRSG TEGADERM 2-3/8X2-3/4 SM (GAUZE/BANDAGES/DRESSINGS) IMPLANT
DRSG TEGADERM 4X4.75 (GAUZE/BANDAGES/DRESSINGS) IMPLANT
ELECT REM PT RETURN 15FT ADLT (MISCELLANEOUS) ×2 IMPLANT
GAUZE 4X4 16PLY RFD (DISPOSABLE) ×2 IMPLANT
GAUZE SPONGE 4X4 12PLY STRL (GAUZE/BANDAGES/DRESSINGS) IMPLANT
GLOVE SURG ENC MOIS LTX SZ7 (GLOVE) ×2 IMPLANT
GLOVE SURG UNDER POLY LF SZ7.5 (GLOVE) ×2 IMPLANT
GOWN STRL REUS W/TWL LRG LVL3 (GOWN DISPOSABLE) ×2 IMPLANT
GOWN STRL REUS W/TWL XL LVL3 (GOWN DISPOSABLE) ×2 IMPLANT
KIT BASIN OR (CUSTOM PROCEDURE TRAY) ×2 IMPLANT
KIT PORT POWER 8FR ISP CVUE (Port) ×1 IMPLANT
KIT TURNOVER KIT A (KITS) ×2 IMPLANT
NDL HYPO 25X1 1.5 SAFETY (NEEDLE) ×1 IMPLANT
NEEDLE HYPO 25X1 1.5 SAFETY (NEEDLE) ×2 IMPLANT
PACK BASIC VI WITH GOWN DISP (CUSTOM PROCEDURE TRAY) ×2 IMPLANT
PENCIL SMOKE EVACUATOR (MISCELLANEOUS) IMPLANT
SUT MNCRL AB 4-0 PS2 18 (SUTURE) ×2 IMPLANT
SUT PROLENE 2 0 SH DA (SUTURE) ×2 IMPLANT
SUT SILK 2 0 (SUTURE)
SUT SILK 2-0 30XBRD TIE 12 (SUTURE) IMPLANT
SUT VIC AB 3-0 SH 27 (SUTURE) ×2
SUT VIC AB 3-0 SH 27XBRD (SUTURE) ×1 IMPLANT
SYR 10ML LL (SYRINGE) ×2 IMPLANT
SYR CONTROL 10ML LL (SYRINGE) ×2 IMPLANT
TOWEL OR 17X26 10 PK STRL BLUE (TOWEL DISPOSABLE) ×2 IMPLANT
TOWEL OR NON WOVEN STRL DISP B (DISPOSABLE) ×2 IMPLANT

## 2021-01-25 NOTE — Progress Notes (Signed)
Called pt to introduce myself as her Financial Resource Specialist and to discuss the [employer] grant.  Unfortunately there aren't any foundations offering copay assistance for her Dx and the type of ins she has.  I left a msg requesting she return my call if she's interested in applying for the grant.  

## 2021-01-25 NOTE — Anesthesia Postprocedure Evaluation (Signed)
Anesthesia Post Note  Patient: Christina Jacobs  Procedure(s) Performed: INSERTION PORT-A-CATH (Right Neck)     Patient location during evaluation: PACU Anesthesia Type: General Level of consciousness: awake and alert Pain management: pain level controlled Vital Signs Assessment: post-procedure vital signs reviewed and stable Respiratory status: spontaneous breathing, nonlabored ventilation and respiratory function stable Cardiovascular status: blood pressure returned to baseline and stable Postop Assessment: no apparent nausea or vomiting Anesthetic complications: no   No complications documented.  Last Vitals:  Vitals:   01/25/21 1000 01/25/21 1020  BP: 122/67 (!) 107/59  Pulse: 62 63  Resp: 14 16  Temp:    SpO2: 92% 93%    Last Pain:  Vitals:   01/25/21 1020  TempSrc:   PainSc: 0-No pain                 Lidia Collum

## 2021-01-25 NOTE — H&P (Signed)
  80 yof who has fh in [facility] presents with palpable mass in left breast. she was noted on mm to have a 2.5 cm mass and an enlarged node. US shows a 2.4x2.6x1.1 cm mass. there is a 1.3 cm node. biopsy of the node is positive for carcinoma. 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 she lives in [place]. she has history of afib on eliquis, MG on long term prednisone. she has difficulty walking up steps and lying flat  Past Surgical History Conni Slipper, RN; 01/04/2021 7:54 AM) Breast Biopsy  Left. Colon Polyp Removal - Colonoscopy  Hemorrhoidectomy  Spinal Surgery - Neck  Tonsillectomy   Diagnostic Studies History Conni Slipper, RN; 01/04/2021 7:54 AM) Colonoscopy  within last year Mammogram  within last year Pap Smear  >5 years ago  Medication History Conni Slipper, RN; 01/04/2021 7:54 AM) Medications Reconciled  Social History Conni Slipper, RN; 01/04/2021 7:54 AM) Caffeine use  Carbonated beverages. No alcohol use  No drug use  Tobacco use  Never smoker.  Family History Conni Slipper, RN; 01/04/2021 7:54 AM) Alcohol Abuse  Father. Breast Cancer  Family Members In [place]  Father, Son. Heart disease in female family member before age 64  Ischemic Bowel Disease  Son.  Pregnancy / Birth History Conni Slipper, RN; 01/04/2021 7:54 AM) Age at menarche  80 years. Gravida  3 Maternal age  11-25 Para  91  Other Problems Conni Slipper, RN; 01/04/2021 7:54 AM) Asthma  Atrial Fibrillation  Breast Cancer  Depression  Hemorrhoids  High blood pressure  Inguinal Hernia    Review of Systems Conni Slipper RN; 01/04/2021 7:54 AM) General Not Present- Appetite Loss, Chills, Fatigue, Fever, Night Sweats, Weight Gain and Weight Loss. Skin Not Present- Change in Wart/Mole, Dryness, Hives, Jaundice, New Lesions, Non-Healing Wounds, Rash and Ulcer. HEENT Present- Wears glasses/contact lenses. Not Present-  Earache, Hearing Loss, Hoarseness, Nose Bleed, Oral Ulcers, Ringing in the Ears, Seasonal Allergies, Sinus Pain, Sore Throat, Visual Disturbances and Yellow Eyes. Respiratory Present- Difficulty Breathing and Wheezing. Not Present- Bloody sputum, Chronic Cough and Snoring. Breast Not Present- Breast Mass, Breast Pain, Nipple Discharge and Skin Changes. Cardiovascular Present- Difficulty Breathing Lying Down and Rapid Heart Rate. Not Present- Chest Pain, Leg Cramps, Palpitations, Shortness of Breath and Swelling of Extremities. Gastrointestinal Present- Constipation, Excessive gas and Hemorrhoids. Not Present- Abdominal Pain, Bloating, Bloody Stool, Change in Bowel Habits, Chronic diarrhea, Difficulty Swallowing, Gets full quickly at meals, Indigestion, Nausea, Rectal Pain and Vomiting. Female Genitourinary Not Present- Frequency, Nocturia, Painful Urination, Pelvic Pain and Urgency. Musculoskeletal Not Present- Back Pain, Joint Pain, Joint Stiffness, Muscle Pain, Muscle Weakness and Swelling of Extremities. Neurological Not Present- Decreased Memory, Fainting, Headaches, Numbness, Seizures, Tingling, Tremor, Trouble walking and Weakness. Psychiatric Present- Anxiety and Depression. Not Present- Bipolar, Change in Sleep Pattern, Fearful and Frequent crying. Endocrine Not Present- Cold Intolerance, Excessive Hunger, Hair Changes, Heat Intolerance, Hot flashes and New Diabetes. Hematology Present- Blood Thinners. Not Present- Easy Bruising, Excessive bleeding, Gland problems, HIV and Persistent Infections.   Physical Exam [name] MD; 01/04/2021 11:42 AM) General Mental Status-Alert. Orientation-Oriented X3. Breast Nipples-No Discharge. Breast Lump-No Palpable Breast Mass. Lymphatic Head & Neck General Head & Neck Lymphatics: Bilateral - Description - Normal. Axillary General Axillary Region: Bilateral - Description - Normal. Note: no [place] adenopathy  A/P Primary systemic  therapy Port placement

## 2021-01-25 NOTE — Transfer of Care (Signed)
Immediate Anesthesia Transfer of Care Note  Patient: Christina Jacobs  Procedure(s) Performed: INSERTION PORT-A-CATH (Right Neck)  Patient Location: PACU  Anesthesia Type:General  Level of Consciousness: awake, alert  and oriented  Airway & Oxygen Therapy: Patient Spontanous Breathing and Patient connected to face mask  Post-op Assessment: Report given to RN and Post -op Vital signs reviewed and stable  Post vital signs: Reviewed and stable  Last Vitals:  Vitals Value Taken Time  BP 127/73 01/25/21 0945  Temp 36.4 C 01/25/21 0939  Pulse 65 01/25/21 0946  Resp 27 01/25/21 0942  SpO2 100 % 01/25/21 0946  Vitals shown include unvalidated device data.  Last Pain:  Vitals:   01/25/21 0939  TempSrc:   PainSc: 0-No pain         Complications: No complications documented.

## 2021-01-25 NOTE — Telephone Encounter (Signed)
Scheduled lab and portflush per sch msg. Called and left msg

## 2021-01-25 NOTE — Anesthesia Procedure Notes (Signed)
Procedure Name: LMA Insertion Performed by: Rosaland Lao, CRNA Pre-anesthesia Checklist: Patient identified, Emergency Drugs available, Suction available and Patient being monitored Patient Re-evaluated:Patient Re-evaluated prior to induction Oxygen Delivery Method: Circle system utilized Preoxygenation: Pre-oxygenation with 100% oxygen Induction Type: IV induction LMA: LMA inserted LMA Size: 4.0 Tube type: Oral Number of attempts: 1 Placement Confirmation: positive ETCO2 and breath sounds checked- equal and bilateral Tube secured with: Tape Dental Injury: Teeth and Oropharynx as per pre-operative assessment

## 2021-01-25 NOTE — Assessment & Plan Note (Signed)
01/02/2021: Screening mammogram detected left breast mass at 12 o'clock position 2.5 cm spiculated mass. Abnormal left axillary lymph node 1.3 cm. 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  Neoadjuvant chemotherapy with 4 cycles of Taxotere and Cytoxan 2. followed by breast conserving surgery with targeted lymph node surgery 3.  Adjuvant radiation 4.  Follow-up adjuvant antiestrogen therapy with anastrozole 1 mg daily x7 years ------------------------------------------------------------------------------------------------------------ Current Treatment: Cycle 1 Taxotere and Cytoxan Labs reviewed. Chemo consent obtained. Chemo education completed RTC in 1 week for toxicity check

## 2021-01-25 NOTE — Interval H&P Note (Signed)
History and Physical Interval Note:  01/25/2021 7:02 AM  Christina Jacobs  has presented today for surgery, with the diagnosis of BREAST CANCER.  The various methods of treatment have been discussed with the patient and family. After consideration of risks, benefits and other options for treatment, the patient has consented to  Procedure(s) with comments: INSERTION PORT-A-CATH (N/A) - START TIME OF 8:30 AM FOR 60 MINUTES ROOM 4 [place] [facility] as a surgical intervention.  The patient's history has been reviewed, patient examined, no change in status, stable for surgery.  I have reviewed the patient's chart and labs.  Questions were answered to the patient's satisfaction.     [name]

## 2021-01-25 NOTE — Discharge Instructions (Signed)
    PORT-A-CATH: POST OP INSTRUCTIONS  Always review your discharge instruction sheet given to you by the facility where your surgery was performed.   1. A prescription for pain medication may be given to you upon discharge. Take your pain medication as prescribed, if needed. If narcotic pain medicine is not needed, then you make take acetaminophen (Tylenol) or ibuprofen (Advil) as needed.  2. Take your usually prescribed medications unless otherwise directed. 3. If you need a refill on your pain medication, please contact our office. All narcotic pain medicine now requires a paper prescription.  Phoned in and fax refills are no longer allowed by law.  Prescriptions will not be filled after 5 pm or on weekends.  4. You should follow a light diet for the remainder of the day after your procedure. 5. Most patients will experience some mild swelling and/or bruising in the area of the incision. It may take several days to resolve. 6. It is common to experience some constipation if taking pain medication after surgery. Increasing fluid intake and taking a stool softener (such as Colace) will usually help or prevent this problem from occurring. A mild laxative (Milk of Magnesia or Miralax) should be taken according to package directions if there are no bowel movements after 48 hours.  7. Unless discharge instructions indicate otherwise, you may remove your bandages 48 hours after surgery, and you may shower at that time. You may have steri-strips (small white skin tapes) in place directly over the incision.  These strips should be left on the skin for 7-10 days.  If your surgeon used Dermabond (skin glue) on the incision, you may shower in 24 hours.  The glue will flake off over the next 2-3 weeks.  8. If your port is left accessed at the end of surgery (needle left in port), the dressing cannot get wet and should only by changed by a healthcare professional. When the port is no longer accessed (when the  needle has been removed), follow step 7.   9. ACTIVITIES:  Limit activity involving your arms for the next 72 hours. Do no strenuous exercise or activity for 1 week. You may drive when you are no longer taking prescription pain medication, you can comfortably wear a seatbelt, and you can maneuver your car. 10.You may need to see your doctor in the office for a follow-up appointment.  Please       check with your doctor.  11.When you receive a new Port-a-Cath, you will get a product guide and        ID card.  Please keep them in case you need them.  WHEN TO CALL YOUR DOCTOR ([phone]): 1. Fever over 101.0 2. Chills 3. Continued bleeding from incision 4. Increased redness and tenderness at the site 5. Shortness of breath, difficulty breathing   The clinic staff is available to answer your questions during regular business hours. Please don't hesitate to call and ask to speak to one of the nurses or medical assistants for clinical concerns. If you have a medical emergency, go to the nearest emergency room or call 911.  A surgeon from [facility] is always on call at the hospital.     For further information, please visit [email or web address]      

## 2021-01-25 NOTE — Progress Notes (Signed)
Patient Care Team: Laurey Morale, MD as PCP - General [name], MD as PCP - Cardiology (Cardiology) Elsie Stain, MD (Pulmonary Disease) [name], MD as Consulting Physician (General Surgery) [name], MD as Consulting Physician (General Surgery) [name], MD as Consulting Physician (Hematology and Oncology) Kyung Rudd, MD as Consulting Physician (Radiation Oncology) Rockwell Germany, RN as Oncology Nurse Navigator Mauro Kaufmann, RN as Oncology Nurse Navigator  DIAGNOSIS:    ICD-10-CM   1. Malignant neoplasm of upper-outer quadrant of left breast in female, estrogen receptor positive ([place])  C50.412    Z17.0     SUMMARY OF ONCOLOGIC HISTORY: Oncology History  Malignant neoplasm of upper-outer quadrant of left breast in female, estrogen receptor positive ([place])  01/02/2021 Initial Diagnosis   Patient experienced chronic left breast tenderness for 2 years with a negative mammogram last year. Screening mammogram showed a 2.5cm central left breast mass and one enlarged lymph node. US showed a 2.6cm mass at the 12 o'clock position and a 1.3cm mass in the left axillary tail. 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 with above-mentioned history of left breast cancer currently on neoadjuvant chemotherapy with Taxotere and Cytoxan. Her port was placed by Dr. Donne Hazel on 01/25/21  She presents to the clinic today for cycle 1.   ALLERGIES:  is allergic to codeine, eliquis [apixaban], propoxyphene hcl, meperidine hcl, and tape.  MEDICATIONS:  Current Outpatient Medications  Medication Sig Dispense Refill  . albuterol (VENTOLIN HFA) 108 (90 Base) MCG/ACT inhaler Inhale 2 puffs into the lungs every 4 (four) hours as needed for wheezing or shortness of breath. 18 g 5  . atorvastatin (LIPITOR) 10 MG tablet TAKE 1 TABLET BY MOUTH EVERY DAY (Patient taking differently: Take 10 mg by mouth daily.) 90 tablet 3  . dexamethasone (DECADRON) 4 MG tablet Take 1 tablet (4 mg total) by mouth daily. Take 1 tablet day before chemo and 1 tablet day after chemo with food 8 tablet 0  . flecainide (TAMBOCOR) 50 MG tablet TAKE 1 TABLET BY MOUTH TWICE A DAY 180 tablet 2  . latanoprost (XALATAN) 0.005 % ophthalmic solution Place 1 drop into both eyes every evening.    Marland Kitchen levothyroxine (SYNTHROID) 100 MCG tablet TAKE 1 TABLET BY MOUTH EVERY DAY (Patient taking differently: Take 100 mcg by mouth daily before breakfast.) 90 tablet 3  . lidocaine-prilocaine (EMLA) cream Apply to affected area once 30 g 3  . metoprolol succinate (TOPROL-XL) 50 MG 24 hr tablet Take 1 tablet (50 mg total) by mouth daily. Take with or immediately following a meal. 270 tablet 0  . multivitamin-iron-minerals-folic acid (CENTRUM) chewable tablet Chew 1 tablet by mouth daily.    Marland Kitchen omeprazole (PRILOSEC) 40 MG capsule TAKE 1 CAPSULE (40 MG TOTAL) BY MOUTH DAILY AS NEEDED (REFLUX). 90 capsule 3  . ondansetron (ZOFRAN)  8 MG tablet Take 1 tablet (8 mg total) by mouth 2 (two) times daily as needed for refractory nausea / vomiting. Start on day 3 after chemo. 30 tablet 1  . prochlorperazine (COMPAZINE) 10 MG tablet Take 1 tablet (10 mg total) by mouth every 6 (six) hours as needed (Nausea or vomiting). 30 tablet 1  . Rivaroxaban (XARELTO) 15 MG TABS tablet TAKE 1 TABLET (15 MG TOTAL) BY MOUTH DAILY WITH SUPPER. (Patient taking  differently: Take 15 mg by mouth daily with supper. TAKE 1 TABLET (15 MG TOTAL) BY MOUTH DAILY WITH SUPPER.) 90 tablet 1  . timolol (BETIMOL) 0.5 % ophthalmic solution Place 1 drop into both eyes 2 (two) times daily.    Marland Kitchen triamterene-hydrochlorothiazide (DYAZIDE) 37.5-25 MG capsule Take 1 each (1 capsule total) by mouth daily. 90 capsule 3   No current facility-administered medications for this visit.    PHYSICAL EXAMINATION: ECOG PERFORMANCE STATUS: 1 - Symptomatic but completely ambulatory  There were no vitals filed for this visit. There were no vitals filed for this visit.  LABORATORY DATA:  I have reviewed the data as listed CMP Latest Ref Rng & Units 01/23/2021 01/04/2021 08/23/2020  Glucose 70 - 99 mg/dL 107(H) 111(H) 93  BUN 8 - 23 mg/dL _0 Creatinine 0.44 - 1.00 mg/dL 1.37(H) 1.37(H) 1.45(H)  Sodium 135 - 145 mmol/L 141 141 141  Potassium 3.5 - 5.1 mmol/L 3.5 3.3(L) 4.1  Chloride 98 - 111 mmol/L 106 105 102  CO2 22 - 32 mmol/L _1 Calcium 8.9 - 10.3 mg/dL 9.5 9.5 10.0  Total Protein 6.5 - 8.1 g/dL - 6.5 6.6  Total Bilirubin 0.3 - 1.2 mg/dL - 1.3(H) 1.5(H)  Alkaline Phos 38 - 126 U/L - 54 -  AST 15 - 41 U/L - 20 19  ALT 0 - 44 U/L - 21 15    Lab Results  Component Value Date   WBC 12.0 (H) 01/26/2021   HGB 13.5 01/26/2021   HCT 40.4 01/26/2021   MCV 92.4 01/26/2021   PLT 174 01/26/2021   NEUTROABS 10.6 (H) 01/26/2021    ASSESSMENT & PLAN:  Malignant neoplasm of upper-outer quadrant of left breast in female, estrogen receptor positive ([place]) 01/02/2021: Screening mammogram detected left breast mass at 12 o'clock position 2.5 cm spiculated mass. Abnormal left axillary lymph node 1.3 cm. 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  Neoadjuvant chemotherapy with 4 cycles  of Taxotere and Cytoxan 2. followed by breast conserving surgery with targeted lymph node surgery 3.  Adjuvant radiation 4.  Follow-up adjuvant antiestrogen therapy with anastrozole 1 mg daily x7 years ------------------------------------------------------------------------------------------------------------ Current Treatment: Cycle 1 Taxotere and Cytoxan Labs reviewed. Chemo consent obtained. Chemo education completed We are starting her chemo at a slightly lower dosage to ensure tolerability to the treatment.  RTC in 1 week for toxicity check    No orders of the defined types were placed in this encounter.  The patient has a good understanding of the overall plan. she agrees with it. she will call with any problems that may develop before the next visit here.  Total time spent: 30 mins including face to face time and time spent for planning, charting and coordination of care  Rulon Eisenmenger, MD, MPH 01/26/2021  I,  Molly Dorshimer, am acting as scribe for Dr. Tayquan Gassman.  I have reviewed the above documentation for accuracy and completeness, and I agree with the above.       

## 2021-01-25 NOTE — Op Note (Signed)
Preoperative diagnosis: clinical stage II node positive breast cancer Postoperative diagnosis: Same as above Procedure: Rightinternal jugular port placement with ultrasound guidance Surgeon: Dr. Serita Grammes Anesthesia: General  Estimated blood loss:minimal Specimens:none Sponge and needlecount was correct atcompletion Drains: None Disposition recovery stable condition  Indications: 30 yof who has fh in MGM presents with palpable mass in left breast. she was noted on mm to have a 2.5 cm mass and an enlarged node. US shows a 2.4x2.6x1.1 cm mass. there is a 1.3 cm node. biopsy of the node is positive for carcinoma. 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   Procedure: After informed consent was obtainedshe was taken to the OR.She was given antibiotics. SCDs were placed. She was placed under general anesthesia without complication. She was prepped and draped in the standard sterile surgical fashion. A surgical timeout was then performed.  I used the ultrasound to identify therightinternal jugular vein. Under ultrasound guidance I then accessed the vein with the needle. I passed the wire. The wire was in the vein both by ultrasound and by fluoroscopy. I thenmade an incisionon herright chest.I tunneled the line between the 2 sites. I then placed the dilator over the wire. I observed this with fluoroscopy to go in the correct position. I then removedthe wire. I then passed the line. The peel-away sheath was removed. I pulled the line back to be in the superior vena cava.The tip of the line is in the superior vena cava near the cavoatrial junction.I then attached the port. I sutured this into place with 2-0 Prolene. I then closed this with 3-0 Vicryl and 4-0 Monocryl. Glue was placed. Final fluoroscopic image  showed the port to be in good position. I then accessed the port and was able to aspirate blood and packed this with heparin. I placed a dressing and left accessed to begin chemotherapy tomorrow. She tolerated well, was transferred to recovery stable.

## 2021-01-26 ENCOUNTER — Telehealth: Payer: Self-pay | Admitting: *Deleted

## 2021-01-26 ENCOUNTER — Inpatient Hospital Stay: Payer: Medicare HMO

## 2021-01-26 ENCOUNTER — Encounter (HOSPITAL_COMMUNITY): Payer: Self-pay | Admitting: General Surgery

## 2021-01-26 ENCOUNTER — Inpatient Hospital Stay: Payer: Medicare HMO | Admitting: Hematology and Oncology

## 2021-01-26 ENCOUNTER — Other Ambulatory Visit: Payer: Self-pay

## 2021-01-26 ENCOUNTER — Encounter: Payer: Self-pay | Admitting: *Deleted

## 2021-01-26 VITALS — BP 155/73 | HR 61 | Temp 98.4°F | Resp 17

## 2021-01-26 DIAGNOSIS — Z170 Estrogen receptor positive status [ER+]: Secondary | ICD-10-CM

## 2021-01-26 DIAGNOSIS — C50412 Malignant neoplasm of upper-outer quadrant of left female breast: Secondary | ICD-10-CM

## 2021-01-26 DIAGNOSIS — Z95828 Presence of other vascular implants and grafts: Secondary | ICD-10-CM | POA: Insufficient documentation

## 2021-01-26 DIAGNOSIS — Z5111 Encounter for antineoplastic chemotherapy: Secondary | ICD-10-CM | POA: Diagnosis not present

## 2021-01-26 LAB — COMPREHENSIVE METABOLIC PANEL
ALT: 20 U/L (ref 0–44)
AST: 18 U/L (ref 15–41)
Albumin: 3.6 g/dL (ref 3.5–5.0)
Alkaline Phosphatase: 54 U/L (ref 38–126)
Anion gap: 12 (ref 5–15)
BUN: 29 mg/dL — ABNORMAL HIGH (ref 8–23)
CO2: 25 mmol/L (ref 22–32)
Calcium: 9.2 mg/dL (ref 8.9–10.3)
Chloride: 102 mmol/L (ref 98–111)
Creatinine, Ser: 1.5 mg/dL — ABNORMAL HIGH (ref 0.44–1.00)
GFR, Estimated: 35 mL/min — ABNORMAL LOW (ref >60)
Glucose, Bld: 172 mg/dL — ABNORMAL HIGH (ref 70–99)
Potassium: 3.7 mmol/L (ref 3.5–5.1)
Sodium: 139 mmol/L (ref 135–145)
Total Bilirubin: 0.9 mg/dL (ref 0.3–1.2)
Total Protein: 6.5 g/dL (ref 6.5–8.1)

## 2021-01-26 LAB — CBC WITH DIFFERENTIAL/PLATELET
Abs Immature Granulocytes: 0.05 10*3/uL (ref 0.00–0.07)
Basophils Absolute: 0 10*3/uL (ref 0.0–0.1)
Basophils Relative: 0 %
Eosinophils Absolute: 0 10*3/uL (ref 0.0–0.5)
Eosinophils Relative: 0 %
HCT: 40.4 % (ref 36.0–46.0)
Hemoglobin: 13.5 g/dL (ref 12.0–15.0)
Immature Granulocytes: 0 %
Lymphocytes Relative: 8 %
Lymphs Abs: 1 10*3/uL (ref 0.7–4.0)
MCH: 30.9 pg (ref 26.0–34.0)
MCHC: 33.4 g/dL (ref 30.0–36.0)
MCV: 92.4 fL (ref 80.0–100.0)
Monocytes Absolute: 0.4 10*3/uL (ref 0.1–1.0)
Monocytes Relative: 4 %
Neutro Abs: 10.6 10*3/uL — ABNORMAL HIGH (ref 1.7–7.7)
Neutrophils Relative %: 88 %
Platelets: 174 10*3/uL (ref 150–400)
RBC: 4.37 MIL/uL (ref 3.87–5.11)
RDW: 13.3 % (ref 11.5–15.5)
WBC: 12 10*3/uL — ABNORMAL HIGH (ref 4.0–10.5)
nRBC: 0 % (ref 0.0–0.2)

## 2021-01-26 MED ORDER — PALONOSETRON HCL INJECTION 0.25 MG/5ML
0.2500 mg | Freq: Once | INTRAVENOUS | Status: AC
  Administered 2021-01-26: 0.25 mg via INTRAVENOUS

## 2021-01-26 MED ORDER — SODIUM CHLORIDE 0.9 % IV SOLN
500.0000 mg/m2 | Freq: Once | INTRAVENOUS | Status: AC
  Administered 2021-01-26: 920 mg via INTRAVENOUS
  Filled 2021-01-26: qty 46

## 2021-01-26 MED ORDER — SODIUM CHLORIDE 0.9 % IV SOLN
10.0000 mg | Freq: Once | INTRAVENOUS | Status: AC
  Administered 2021-01-26: 10 mg via INTRAVENOUS
  Filled 2021-01-26: qty 10

## 2021-01-26 MED ORDER — SODIUM CHLORIDE 0.9% FLUSH
10.0000 mL | INTRAVENOUS | Status: DC | PRN
  Administered 2021-01-26: 10 mL
  Filled 2021-01-26: qty 10

## 2021-01-26 MED ORDER — PALONOSETRON HCL INJECTION 0.25 MG/5ML
INTRAVENOUS | Status: AC
  Filled 2021-01-26: qty 5

## 2021-01-26 MED ORDER — SODIUM CHLORIDE 0.9% FLUSH
10.0000 mL | Freq: Once | INTRAVENOUS | Status: AC
  Administered 2021-01-26: 10 mL
  Filled 2021-01-26: qty 10

## 2021-01-26 MED ORDER — SODIUM CHLORIDE 0.9 % IV SOLN
Freq: Once | INTRAVENOUS | Status: AC
  Filled 2021-01-26: qty 250

## 2021-01-26 MED ORDER — SODIUM CHLORIDE 0.9 % IV SOLN
65.0000 mg/m2 | Freq: Once | INTRAVENOUS | Status: AC
  Administered 2021-01-26: 120 mg via INTRAVENOUS
  Filled 2021-01-26: qty 12

## 2021-01-26 MED ORDER — HEPARIN SOD (PORK) LOCK FLUSH 100 UNIT/ML IV SOLN
500.0000 [IU] | Freq: Once | INTRAVENOUS | Status: AC | PRN
  Administered 2021-01-26: 500 [IU]
  Filled 2021-01-26: qty 5

## 2021-01-26 NOTE — Telephone Encounter (Signed)
Pt glucose today 172.  Per MD pt needing to hold dexamethasone tomorrow and prior to next tx cycle.  Pt notified and verbalized understanding.

## 2021-01-26 NOTE — Patient Instructions (Signed)
Cyclophosphamide Injection What is this medicine? CYCLOPHOSPHAMIDE (sye kloe FOSS fa mide) is a chemotherapy drug. It slows the growth of cancer cells. This medicine is used to treat many types of cancer like lymphoma, myeloma, leukemia, breast cancer, and ovarian cancer, to name a few. This medicine may be used for other purposes; ask your health care provider or pharmacist if you have questions. COMMON BRAND NAME(S): Cytoxan, Neosar What should I tell my health care provider before I take this medicine? They need to know if you have any of these conditions:  heart disease  history of irregular heartbeat  infection  kidney disease  liver disease  low blood counts, like white cells, platelets, or red blood cells  on hemodialysis  recent or ongoing radiation therapy  scarring or thickening of the lungs  trouble passing urine  an unusual or allergic reaction to cyclophosphamide, other medicines, foods, dyes, or preservatives  pregnant or trying to get pregnant  breast-feeding How should I use this medicine? This drug is usually given as an injection into a vein or muscle or by infusion into a vein. It is administered in a hospital or clinic by a specially trained health care professional. Talk to your pediatrician regarding the use of this medicine in children. Special care may be needed. Overdosage: If you think you have taken too much of this medicine contact a poison control center or emergency room at once. NOTE: This medicine is only for you. Do not share this medicine with others. What if I miss a dose? It is important not to miss your dose. Call your doctor or health care professional if you are unable to keep an appointment. What may interact with this medicine?  amphotericin B  azathioprine  certain antivirals for HIV or hepatitis  certain medicines for blood pressure, heart disease, irregular heart beat  certain medicines that treat or prevent blood clots  like warfarin  certain other medicines for cancer  cyclosporine  etanercept  indomethacin  medicines that relax muscles for surgery  medicines to increase blood counts  metronidazole This list may not describe all possible interactions. Give your health care provider a list of all the medicines, herbs, non-prescription drugs, or dietary supplements you use. Also tell them if you smoke, drink alcohol, or use illegal drugs. Some items may interact with your medicine. What should I watch for while using this medicine? Your condition will be monitored carefully while you are receiving this medicine. You may need blood work done while you are taking this medicine. Drink water or other fluids as directed. Urinate often, even at night. Some products may contain alcohol. Ask your health care professional if this medicine contains alcohol. Be sure to tell all health care professionals you are taking this medicine. Certain medicines, like metronidazole and disulfiram, can cause an unpleasant reaction when taken with alcohol. The reaction includes flushing, headache, nausea, vomiting, sweating, and increased thirst. The reaction can last from 30 minutes to several hours. Do not become pregnant while taking this medicine or for 1 year after stopping it. Women should inform their health care professional if they wish to become pregnant or think they might be pregnant. Men should not father a child while taking this medicine and for 4 months after stopping it. There is potential for serious side effects to an unborn child. Talk to your health care professional for more information. Do not breast-feed an infant while taking this medicine or for 1 week after stopping it. This medicine has  caused ovarian failure in some women. This medicine may make it more difficult to get pregnant. Talk to your health care professional if you are concerned about your fertility. This medicine has caused decreased sperm  counts in some men. This may make it more difficult to father a child. Talk to your health care professional if you are concerned about your fertility. Call your health care professional for advice if you get a fever, chills, or sore throat, or other symptoms of a cold or flu. Do not treat yourself. This medicine decreases your body's ability to fight infections. Try to avoid being around people who are sick. Avoid taking medicines that contain aspirin, acetaminophen, ibuprofen, naproxen, or ketoprofen unless instructed by your health care professional. These medicines may hide a fever. Talk to your health care professional about your risk of cancer. You may be more at risk for certain types of cancer if you take this medicine. If you are going to need surgery or other procedure, tell your health care professional that you are using this medicine. Be careful brushing or flossing your teeth or using a toothpick because you may get an infection or bleed more easily. If you have any dental work done, tell your dentist you are receiving this medicine. What side effects may I notice from receiving this medicine? Side effects that you should report to your doctor or health care professional as soon as possible:  allergic reactions like skin rash, itching or hives, swelling of the face, lips, or tongue  breathing problems  nausea, vomiting  signs and symptoms of bleeding such as bloody or black, tarry stools; red or dark brown urine; spitting up blood or brown material that looks like coffee grounds; red spots on the skin; unusual bruising or bleeding from the eyes, gums, or nose  signs and symptoms of heart failure like fast, irregular heartbeat, sudden weight gain; swelling of the ankles, feet, hands  signs and symptoms of infection like fever; chills; cough; sore throat; pain or trouble passing urine  signs and symptoms of kidney injury like trouble passing urine or change in the amount of  urine  signs and symptoms of liver injury like dark yellow or brown urine; general ill feeling or flu-like symptoms; light-colored stools; loss of appetite; nausea; right upper belly pain; unusually weak or tired; yellowing of the eyes or skin Side effects that usually do not require medical attention (report to your doctor or health care professional if they continue or are bothersome):  confusion  decreased hearing  diarrhea  facial flushing  hair loss  headache  loss of appetite  missed menstrual periods  signs and symptoms of low red blood cells or anemia such as unusually weak or tired; feeling faint or lightheaded; falls  skin discoloration This list may not describe all possible side effects. Call your doctor for medical advice about side effects. You may report side effects to FDA at 1-800-FDA-1088. Where should I keep my medicine? This drug is given in a hospital or clinic and will not be stored at home. NOTE: This sheet is a summary. It may not cover all possible information. If you have questions about this medicine, talk to your doctor, pharmacist, or health care provider.  2021 Elsevier/Gold Standard (2019-07-27 09:53:29) Docetaxel injection What is this medicine? DOCETAXEL (doe se TAX el) is a chemotherapy drug. It targets fast dividing cells, like cancer cells, and causes these cells to die. This medicine is used to treat many types of cancers like  breast cancer, certain stomach cancers, head and neck cancer, lung cancer, and prostate cancer. This medicine may be used for other purposes; ask your health care provider or pharmacist if you have questions. COMMON BRAND NAME(S): Docefrez, Taxotere What should I tell my health care provider before I take this medicine? They need to know if you have any of these conditions:  infection (especially a virus infection such as chickenpox, cold sores, or herpes)  liver disease  low blood counts, like low white cell,  platelet, or red cell counts  an unusual or allergic reaction to docetaxel, polysorbate 80, other chemotherapy agents, other medicines, foods, dyes, or preservatives  pregnant or trying to get pregnant  breast-feeding How should I use this medicine? This drug is given as an infusion into a vein. It is administered in a hospital or clinic by a specially trained health care professional. Talk to your pediatrician regarding the use of this medicine in children. Special care may be needed. Overdosage: If you think you have taken too much of this medicine contact a poison control center or emergency room at once. NOTE: This medicine is only for you. Do not share this medicine with others. What if I miss a dose? It is important not to miss your dose. Call your doctor or health care professional if you are unable to keep an appointment. What may interact with this medicine? Do not take this medicine with any of the following medications:  live virus vaccines This medicine may also interact with the following medications:  aprepitant  certain antibiotics like erythromycin or clarithromycin  certain antivirals for HIV or hepatitis  certain medicines for fungal infections like fluconazole, itraconazole, ketoconazole, posaconazole, or voriconazole  cimetidine  ciprofloxacin  conivaptan  cyclosporine  dronedarone  fluvoxamine  grapefruit juice  imatinib  verapamil This list may not describe all possible interactions. Give your health care provider a list of all the medicines, herbs, non-prescription drugs, or dietary supplements you use. Also tell them if you smoke, drink alcohol, or use illegal drugs. Some items may interact with your medicine. What should I watch for while using this medicine? Your condition will be monitored carefully while you are receiving this medicine. You will need important blood work done while you are taking this medicine. Call your doctor or health  care professional for advice if you get a fever, chills or sore throat, or other symptoms of a cold or flu. Do not treat yourself. This drug decreases your body's ability to fight infections. Try to avoid being around people who are sick. Some products may contain alcohol. Ask your health care professional if this medicine contains alcohol. Be sure to tell all health care professionals you are taking this medicine. Certain medicines, like metronidazole and disulfiram, can cause an unpleasant reaction when taken with alcohol. The reaction includes flushing, headache, nausea, vomiting, sweating, and increased thirst. The reaction can last from 30 minutes to several hours. You may get drowsy or dizzy. Do not drive, use machinery, or do anything that needs mental alertness until you know how this medicine affects you. Do not stand or sit up quickly, especially if you are an older patient. This reduces the risk of dizzy or fainting spells. Alcohol may interfere with the effect of this medicine. Talk to your health care professional about your risk of cancer. You may be more at risk for certain types of cancer if you take this medicine. Do not become pregnant while taking this medicine or for 6  months after stopping it. Women should inform their doctor if they wish to become pregnant or think they might be pregnant. There is a potential for serious side effects to an unborn child. Talk to your health care professional or pharmacist for more information. Do not breast-feed an infant while taking this medicine or for 1 week after stopping it. Males who get this medicine must use a condom during sex with females who can get pregnant. If you get a woman pregnant, the baby could have birth defects. The baby could die before they are born. You will need to continue wearing a condom for 3 months after stopping the medicine. Tell your health care provider right away if your partner becomes pregnant while you are taking this  medicine. This may interfere with the ability to father a child. You should talk to your doctor or health care professional if you are concerned about your fertility. What side effects may I notice from receiving this medicine? Side effects that you should report to your doctor or health care professional as soon as possible:  allergic reactions like skin rash, itching or hives, swelling of the face, lips, or tongue  blurred vision  breathing problems  changes in vision  low blood counts - This drug may decrease the number of white blood cells, red blood cells and platelets. You may be at increased risk for infections and bleeding.  nausea and vomiting  pain, redness or irritation at site where injected  pain, tingling, numbness in the hands or feet  redness, blistering, peeling, or loosening of the skin, including inside the mouth  signs of decreased platelets or bleeding - bruising, pinpoint red spots on the skin, black, tarry stools, nosebleeds  signs of decreased red blood cells - unusually weak or tired, fainting spells, lightheadedness  signs of infection - fever or chills, cough, sore throat, pain or difficulty passing urine  swelling of the ankle, feet, hands Side effects that usually do not require medical attention (report to your doctor or health care professional if they continue or are bothersome):  constipation  diarrhea  fingernail or toenail changes  hair loss  loss of appetite  mouth sores  muscle pain This list may not describe all possible side effects. Call your doctor for medical advice about side effects. You may report side effects to FDA at 1-800-FDA-1088. Where should I keep my medicine? This drug is given in a hospital or clinic and will not be stored at home. NOTE: This sheet is a summary. It may not cover all possible information. If you have questions about this medicine, talk to your doctor, pharmacist, or health care provider.  2021  Elsevier/Gold Standard (2019-09-21 19:50:31)

## 2021-01-27 ENCOUNTER — Telehealth: Payer: Self-pay | Admitting: *Deleted

## 2021-01-28 ENCOUNTER — Other Ambulatory Visit: Payer: Self-pay

## 2021-01-28 ENCOUNTER — Inpatient Hospital Stay: Payer: Medicare HMO

## 2021-01-28 VITALS — BP 133/63 | HR 66 | Temp 98.5°F | Resp 17

## 2021-01-28 DIAGNOSIS — Z5111 Encounter for antineoplastic chemotherapy: Secondary | ICD-10-CM | POA: Diagnosis not present

## 2021-01-28 DIAGNOSIS — C50412 Malignant neoplasm of upper-outer quadrant of left female breast: Secondary | ICD-10-CM

## 2021-01-28 DIAGNOSIS — Z170 Estrogen receptor positive status [ER+]: Secondary | ICD-10-CM

## 2021-01-28 MED ORDER — PEGFILGRASTIM-CBQV 6 MG/0.6ML ~~LOC~~ SOSY
6.0000 mg | PREFILLED_SYRINGE | Freq: Once | SUBCUTANEOUS | Status: AC
  Administered 2021-01-28: 6 mg via SUBCUTANEOUS

## 2021-01-28 NOTE — Patient Instructions (Signed)
Pegfilgrastim injection What is this medicine? PEGFILGRASTIM (PEG fil gra stim) is a long-acting granulocyte colony-stimulating factor that stimulates the growth of neutrophils, a type of white blood cell important in the body's fight against infection. It is used to reduce the incidence of fever and infection in patients with certain types of cancer who are receiving chemotherapy that affects the bone marrow, and to increase survival after being exposed to high doses of radiation. This medicine may be used for other purposes; ask your health care provider or pharmacist if you have questions. COMMON BRAND NAME(S): Fulphila, Neulasta, Nyvepria, UDENYCA, Ziextenzo What should I tell my health care provider before I take this medicine? They need to know if you have any of these conditions:  kidney disease  latex allergy  ongoing radiation therapy  sickle cell disease  skin reactions to acrylic adhesives (On-Body Injector only)  an unusual or allergic reaction to pegfilgrastim, filgrastim, other medicines, foods, dyes, or preservatives  pregnant or trying to get pregnant  breast-feeding How should I use this medicine? This medicine is for injection under the skin. If you get this medicine at home, you will be taught how to prepare and give the pre-filled syringe or how to use the On-body Injector. Refer to the patient Instructions for Use for detailed instructions. Use exactly as directed. Tell your healthcare provider immediately if you suspect that the On-body Injector may not have performed as intended or if you suspect the use of the On-body Injector resulted in a missed or partial dose. It is important that you put your used needles and syringes in a special sharps container. Do not put them in a trash can. If you do not have a sharps container, call your pharmacist or healthcare provider to get one. Talk to your pediatrician regarding the use of this medicine in children. While this drug  may be prescribed for selected conditions, precautions do apply. Overdosage: If you think you have taken too much of this medicine contact a poison control center or emergency room at once. NOTE: This medicine is only for you. Do not share this medicine with others. What if I miss a dose? It is important not to miss your dose. Call your doctor or health care professional if you miss your dose. If you miss a dose due to an On-body Injector failure or leakage, a new dose should be administered as soon as possible using a single prefilled syringe for manual use. What may interact with this medicine? Interactions have not been studied. This list may not describe all possible interactions. Give your health care provider a list of all the medicines, herbs, non-prescription drugs, or dietary supplements you use. Also tell them if you smoke, drink alcohol, or use illegal drugs. Some items may interact with your medicine. What should I watch for while using this medicine? Your condition will be monitored carefully while you are receiving this medicine. You may need blood work done while you are taking this medicine. Talk to your health care provider about your risk of cancer. You may be more at risk for certain types of cancer if you take this medicine. If you are going to need a MRI, CT scan, or other procedure, tell your doctor that you are using this medicine (On-Body Injector only). What side effects may I notice from receiving this medicine? Side effects that you should report to your doctor or health care professional as soon as possible:  allergic reactions (skin rash, itching or hives, swelling of   the face, lips, or tongue)  back pain  dizziness  fever  pain, redness, or irritation at site where injected  pinpoint red spots on the skin  red or dark-brown urine  shortness of breath or breathing problems  stomach or side pain, or pain at the shoulder  swelling  tiredness  trouble  passing urine or change in the amount of urine  unusual bruising or bleeding Side effects that usually do not require medical attention (report to your doctor or health care professional if they continue or are bothersome):  bone pain  muscle pain This list may not describe all possible side effects. Call your doctor for medical advice about side effects. You may report side effects to FDA at 1-800-FDA-1088. Where should I keep my medicine? Keep out of the reach of children. If you are using this medicine at home, you will be instructed on how to store it. Throw away any unused medicine after the expiration date on the label. NOTE: This sheet is a summary. It may not cover all possible information. If you have questions about this medicine, talk to your doctor, pharmacist, or health care provider.  2021 Elsevier/Gold Standard (2019-11-13 13:20:51)  

## 2021-01-30 ENCOUNTER — Telehealth: Payer: Self-pay | Admitting: *Deleted

## 2021-01-30 NOTE — Telephone Encounter (Signed)
Received call from pt with complaint of constipation x4 days unrelieved by colace and miralax.  Per MD pt to take OTC milk of magnesium as prescribed on the bottle as well as a glycerin suppository to help alleviate constipation.  RN educated pt on increasing fluid intake as well as adding colace daily to prevent constipation. Pt verbalized understanding and appreciative of the advice.

## 2021-01-31 NOTE — Assessment & Plan Note (Signed)
01/02/2021: Screening mammogram detected left breast mass at 12 o'clock position 2.5 cm spiculated mass. Abnormal left axillary lymph node 1.3 cm. 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

## 2021-01-31 NOTE — Progress Notes (Signed)
Patient Care Team: Laurey Morale, MD as PCP - General [name], MD as PCP - Cardiology (Cardiology) Elsie Stain, MD (Pulmonary Disease) [name], MD as Consulting Physician (General Surgery) [name], MD as Consulting Physician (General Surgery) [name], MD as Consulting Physician (Hematology and Oncology) Kyung Rudd, MD as Consulting Physician (Radiation Oncology) Rockwell Germany, RN as Oncology Nurse Navigator Mauro Kaufmann, RN as Oncology Nurse Navigator  DIAGNOSIS:    ICD-10-CM   1. Malignant neoplasm of upper-outer quadrant of left breast in female, estrogen receptor positive ([place])  C50.412    Z17.0     SUMMARY OF ONCOLOGIC HISTORY: Oncology History  Malignant neoplasm of upper-outer quadrant of left breast in female, estrogen receptor positive ([place])  01/02/2021 Initial Diagnosis   Patient experienced chronic left breast tenderness for 2 years with a negative mammogram last year. Screening mammogram showed a 2.5cm central left breast mass and one enlarged lymph node. US showed a 2.6cm mass at the 12 o'clock position and a 1.3cm mass in the left axillary tail. 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 with above-mentioned history of left breast cancer currently on neoadjuvant chemotherapy with Taxotere and Cytoxan. She presents to the clinic today for a  toxicity check following cycle 1.   Her major complaint is constipation after chemo and for which she took milk of magnesia.  When it did not move she took another dose of milk of magnesia which led to profound diarrhea that lasted all night.  Because of that she is more dehydrated and weak and tired.  The diarrhea has subsided.  Energy levels are slightly low because of the diarrhea.  Did not have any nausea or vomiting.  She is still able to eat well and keep the fluids down.  ALLERGIES:  is allergic to codeine, eliquis [apixaban], propoxyphene hcl, meperidine hcl, and tape.  MEDICATIONS:  Current Outpatient Medications  Medication Sig Dispense Refill  . albuterol (VENTOLIN HFA) 108 (90 Base) MCG/ACT inhaler Inhale 2 puffs into the lungs every 4 (four) hours as needed for wheezing or shortness of breath. 18 g 5  . atorvastatin (LIPITOR) 10 MG tablet TAKE 1 TABLET BY MOUTH EVERY DAY (Patient taking differently: Take 10 mg by mouth daily.) 90 tablet 3  . dexamethasone (DECADRON) 4 MG tablet Take 1 tablet (4 mg total) by mouth daily. Take 1 tablet day before chemo and 1 tablet day after chemo with food 8 tablet 0  . flecainide (TAMBOCOR) 50 MG tablet TAKE 1 TABLET BY MOUTH TWICE A DAY 180 tablet 2  . latanoprost (XALATAN) 0.005 % ophthalmic solution Place 1 drop into both eyes every evening.    Marland Kitchen levothyroxine (SYNTHROID) 100 MCG tablet TAKE 1 TABLET BY MOUTH EVERY DAY (Patient taking differently: Take 100 mcg by mouth daily before breakfast.) 90 tablet  3  . lidocaine-prilocaine (EMLA) cream Apply to affected area once 30 g 3  . metoprolol succinate (TOPROL-XL) 50 MG 24 hr tablet Take 1 tablet (50 mg total) by mouth daily. Take with or immediately following a meal. 270 tablet 0  . multivitamin-iron-minerals-folic acid (CENTRUM) chewable tablet Chew 1 tablet by mouth daily.    Marland Kitchen omeprazole (PRILOSEC) 40 MG capsule TAKE 1 CAPSULE (40 MG TOTAL) BY MOUTH DAILY AS NEEDED (REFLUX). 90 capsule 3  .  ondansetron (ZOFRAN) 8 MG tablet Take 1 tablet (8 mg total) by mouth 2 (two) times daily as needed for refractory nausea / vomiting. Start on day 3 after chemo. 30 tablet 1  . prochlorperazine (COMPAZINE) 10 MG tablet Take 1 tablet (10 mg total) by mouth every 6 (six) hours as needed (Nausea or vomiting). 30 tablet 1  . Rivaroxaban (XARELTO) 15 MG TABS tablet TAKE 1 TABLET (15 MG TOTAL) BY MOUTH DAILY WITH SUPPER. (Patient taking differently: Take 15 mg by mouth daily with supper. TAKE 1 TABLET (15 MG TOTAL) BY MOUTH DAILY WITH SUPPER.) 90 tablet 1  . timolol (BETIMOL) 0.5 % ophthalmic solution Place 1 drop into both eyes 2 (two) times daily.    Marland Kitchen triamterene-hydrochlorothiazide (DYAZIDE) 37.5-25 MG capsule Take 1 each (1 capsule total) by mouth daily. 90 capsule 3   No current facility-administered medications for this visit.    PHYSICAL EXAMINATION: ECOG PERFORMANCE STATUS: 2 - Symptomatic, <50% confined to bed  There were no vitals filed for this visit. There were no vitals filed for this visit.   LABORATORY DATA:  I have reviewed the data as listed CMP Latest Ref Rng & Units 01/26/2021 01/23/2021 01/04/2021  Glucose 70 - 99 mg/dL 172(H) 107(H) 111(H)  BUN 8 - 23 mg/dL 29(H) 21 17  Creatinine 0.44 - 1.00 mg/dL 1.50(H) 1.37(H) 1.37(H)  Sodium 135 - 145 mmol/L 139 141 141  Potassium 3.5 - 5.1 mmol/L 3.7 3.5 3.3(L)  Chloride 98 - 111 mmol/L 102 106 105  CO2 22 - 32 mmol/L _0 Calcium 8.9 - 10.3 mg/dL 9.2 9.5 9.5  Total Protein 6.5 - 8.1 g/dL 6.5 - 6.5  Total Bilirubin 0.3 - 1.2 mg/dL 0.9 - 1.3(H)  Alkaline Phos 38 - 126 U/L 54 - 54  AST 15 - 41 U/L 18 - 20  ALT 0 - 44 U/L 20 - 21    Lab Results  Component Value Date   WBC 1.2 (L) 02/01/2021   HGB 13.6 02/01/2021   HCT 41.0 02/01/2021   MCV 92.6 02/01/2021   PLT 94 (L) 02/01/2021   NEUTROABS PENDING 02/01/2021    ASSESSMENT & PLAN:  Malignant neoplasm of upper-outer quadrant of left breast in female, estrogen receptor  positive ([place]) 01/02/2021: Screening mammogram detected left breast mass at 12 o'clock position 2.5 cm spiculated mass. Abnormal left axillary lymph node 1.3 cm. 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  Constipation: Diarrhea after milk of magnesia 2. fatigue as result of diarrhea 3.  Leukopenia: We will reduce the dosage  of cycle 2 of Taxotere and Cytoxan.   RTC in 2 weeks for cycle 2    No orders of the defined types were placed in this encounter.  The patient has a good understanding of the overall plan. she agrees with it. she will call with any problems that may develop before the next visit here.  Total time spent: 30 mins including face to face time and time spent for planning, charting and coordination of care  Rulon Eisenmenger, MD, MPH 02/01/2021  I, Molly Dorshimer, am acting as scribe for Dr. [name].  I have reviewed the above documentation for accuracy and completeness, and I agree with the above.

## 2021-01-31 NOTE — Progress Notes (Deleted)
Date:  01/31/2021   ID:  Christina Jacobs, DOB 1940/11/17, MRN [id number]  PCP:  Laurey Morale, MD  Cardiologist:   Johnsie Cancel Electrophysiologist:  None   Evaluation Performed:  Follow-Up Visit  Chief Complaint:  PAF  History of Present Illness:    80 y.o. 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 Allergic reaction to eliquis now on Xarelto. Low dose due to CKD. Failed [place] and started on flecainide ETT normal 01/31/18 TTE 12/17/17 EF 55-60% otherwise normal . Successful DCC on AAT 01/31/18 June 2019 monitor showed 99% NSR. Has Myasthenia ? Beta blocker making lid lag worse and dose decreased to 50 mg am March 2020    Since last visit diagnosed with left breast cancer on screening mammogram She had port placed 01/25/21  with no issues holding xarelto. She is having Neoadjuvant chemo and will need surgery followed by antiestrogen Rx  ***   Past Medical History:  Diagnosis Date  . Adenomatous colon polyp   . Anal fissure   . Anxiety   . Asthma    Dr. Halford Chessman  . Atrial fibrillation ([place])   . Bowel obstruction ([place])   . Bursitis   . CKD (chronic kidney disease) stage 3, GFR 30-59 ml/min ([facility])    sees Dr. [name]   . Complication of anesthesia    post op N/V  . Diverticulosis   . Dyspnea    After a meal  . Dysrhythmia    Afib  . Glaucoma   . Hiatal hernia    sees Dr. [name] at [facility]   . Hyperlipidemia   . Hypertension   . Hypothyroidism   . Myasthenia gravis ([place]) 2013   followed at [facility] Dr Scheryl Marten, diplopia only  . Osteoarthritis    see's Dr. [name]  . Osteoporosis    last DEXA 08-03-11   Past Surgical History:  Procedure Laterality Date  . ABDOMINAL HYSTERECTOMY    . APPENDECTOMY    . BREAST LUMPECTOMY Bilateral     several, enign breast lumps removed  . CARDIOVERSION N/A 12/31/2017   Procedure: CARDIOVERSION;  Surgeon: Josue Hector, MD;  Location: [facility] ENDOSCOPY;  Service: Cardiovascular;  Laterality: N/A;  . CARDIOVERSION N/A 02/11/2018   Procedure:  CARDIOVERSION;  Surgeon: Josue Hector, MD;  Location: [facility] ENDOSCOPY;  Service: Cardiovascular;  Laterality: N/A;  . COLONOSCOPY  09/27/2010   per Dr. Sharlett Iles, benign polyp, no repeats needed   . HERNIA REPAIR  10-18-10   Dr. Hassell Done attempted but could reduce this, the entire stomach is above the diaphragm  . PORTACATH PLACEMENT Right 01/25/2021   Procedure: INSERTION PORT-A-CATH;  Surgeon: [name], MD;  Location: [facility] ORS;  Service: General;  Laterality: Right;  START TIME OF 8:30 AM FOR 60 MINUTES ROOM 4 [place] IQ  . THYROIDECTOMY, PARTIAL  1999  . TONSILLECTOMY       No outpatient medications have been marked as taking for the 02/07/21 encounter (Appointment) with Josue Hector, MD.     Allergies:   Codeine, Eliquis [apixaban], Propoxyphene hcl, Meperidine hcl, and Tape   Social History   Tobacco Use  . Smoking status: Never Smoker  . Smokeless tobacco: Never Used  Vaping Use  . Vaping Use: Never used  Substance Use Topics  . Alcohol use: No    Alcohol/week: 0.0 standard drinks  . Drug use: No     Family Hx: The patient's family history includes Breast cancer in her maternal grandmother; Cancer in her father and  sister; Clotting disorder in her brother; Colon cancer (age of onset: 61) in her son; Diabetes in her father; Hyperlipidemia in her father; Hypertension in her father; Kidney cancer in her mother.  ROS:   Please see the history of present illness.     All other systems reviewed and are negative.   Prior CV studies:   The following studies were reviewed today:  Echo 12/17/17 Holter: 12/17/17 ETT 01/31/18  Labs/Other Tests and Data Reviewed:    EKG:  SR rate 53 nonspecific ST changes QT normal 01/07/19  Recent Labs: 08/23/2020: TSH 0.57 01/26/2021: ALT 20; BUN 29; Creatinine, Ser 1.50; Hemoglobin 13.5; Platelets 174; Potassium 3.7; Sodium 139   Recent Lipid Panel Lab Results  Component Value Date/Time   CHOL 180 08/23/2020 11:44 AM   TRIG 138  08/23/2020 11:44 AM   HDL 65 08/23/2020 11:44 AM   CHOLHDL 2.8 08/23/2020 11:44 AM   LDLCALC 91 08/23/2020 11:44 AM   LDLDIRECT 142.2 07/27/2013 10:07 AM    Wt Readings from Last 3 Encounters:  01/26/21 76.3 kg  01/23/21 73.9 kg  01/12/21 76.2 kg     Objective:    Vital Signs:  There were no vitals taken for this visit.   Affect appropriate Healthy:  appears stated age 80: normal Neck supple with no adenopathy JVP normal no bruits no thyromegaly Lungs clear with no wheezing and good diaphragmatic motion Heart:  S1/S2 no murmur, no rub, gallop or click PMI normal right port placed for cancer Rx  Abdomen: benighn, BS positve, no tenderness, no AAA no bruit.  No HSM or HJR Distal pulses intact with no bruits No edema Neuro non-focal Skin warm and dry No muscular weakness   ASSESSMENT & PLAN:    1 PAF: post successful DCC on flecainide 02/11/18 anticoagulation with xarelto stable   Monitor 04/28/18 with average HR 56 bpm and NSR 99% of time on higher dose of flecainide 75 mg bid Beta blocker reduced post conversion   2. Chadsvasc score of at least 4  She has had CrCl less than 50 so has been placed on 15 mg dose of Xarelto allergic to eliquis   3. CRF:  F/U [place] Beta blocker is not the issue consider d/c diuretic   4. Thyroid:  TSH normal   5. HLD:  On statin LDL 79 adequate normal LFTs 08/19/19   6. GI:  Large hiatal hernia, overflow incontinence diverticulosis f/u [facility] Plan Had consult at [facility] and she declined surgery   7. Myesthenia:  Thinks beta blockers may be making lid lag worse. Decrease dose decreased  F/u [facility]   8. HtN:  Her home readings seem fine If renal wants to stop diuretic would consider adding norvasc or hydralazine   9. Breast Cancer :  Unfortunately stage 3A see above regarding Rx   COVID-19 Education: The signs and symptoms of COVID-19 were discussed with the patient and how to seek care for testing (follow up with PCP or arrange  E-visit).  The importance of social distancing was discussed today.   Medication Adjustments/Labs and Tests Ordered: Current medicines are reviewed at length with the patient today.  Concerns regarding medicines are outlined above.   Tests Ordered:  None   Medication Changes:  None   Disposition:  Follow up in a year  Signed, Jenkins Rouge, MD  01/31/2021 5:30 PM    [place]

## 2021-02-01 ENCOUNTER — Other Ambulatory Visit: Payer: Self-pay

## 2021-02-01 ENCOUNTER — Encounter: Payer: Self-pay | Admitting: *Deleted

## 2021-02-01 ENCOUNTER — Inpatient Hospital Stay: Payer: Medicare HMO

## 2021-02-01 ENCOUNTER — Encounter: Payer: Self-pay | Admitting: Hematology and Oncology

## 2021-02-01 ENCOUNTER — Inpatient Hospital Stay: Payer: Medicare HMO | Admitting: Hematology and Oncology

## 2021-02-01 DIAGNOSIS — Z170 Estrogen receptor positive status [ER+]: Secondary | ICD-10-CM

## 2021-02-01 DIAGNOSIS — Z95828 Presence of other vascular implants and grafts: Secondary | ICD-10-CM

## 2021-02-01 DIAGNOSIS — C50412 Malignant neoplasm of upper-outer quadrant of left female breast: Secondary | ICD-10-CM

## 2021-02-01 DIAGNOSIS — Z5111 Encounter for antineoplastic chemotherapy: Secondary | ICD-10-CM | POA: Diagnosis not present

## 2021-02-01 LAB — CBC WITH DIFFERENTIAL/PLATELET
Abs Immature Granulocytes: 0.01 10*3/uL (ref 0.00–0.07)
Basophils Absolute: 0 10*3/uL (ref 0.0–0.1)
Basophils Relative: 2 %
Eosinophils Absolute: 0 10*3/uL (ref 0.0–0.5)
Eosinophils Relative: 1 %
HCT: 41 % (ref 36.0–46.0)
Hemoglobin: 13.6 g/dL (ref 12.0–15.0)
Immature Granulocytes: 1 %
Lymphocytes Relative: 69 %
Lymphs Abs: 0.8 10*3/uL (ref 0.7–4.0)
MCH: 30.7 pg (ref 26.0–34.0)
MCHC: 33.2 g/dL (ref 30.0–36.0)
MCV: 92.6 fL (ref 80.0–100.0)
Monocytes Absolute: 0.2 10*3/uL (ref 0.1–1.0)
Monocytes Relative: 17 %
Neutro Abs: 0.1 10*3/uL — CL (ref 1.7–7.7)
Neutrophils Relative %: 10 %
Platelets: 94 10*3/uL — ABNORMAL LOW (ref 150–400)
RBC: 4.43 MIL/uL (ref 3.87–5.11)
RDW: 12.9 % (ref 11.5–15.5)
WBC Morphology: REACTIVE
WBC: 1.2 10*3/uL — ABNORMAL LOW (ref 4.0–10.5)
nRBC: 0 % (ref 0.0–0.2)

## 2021-02-01 LAB — COMPREHENSIVE METABOLIC PANEL
ALT: 16 U/L (ref 0–44)
AST: 16 U/L (ref 15–41)
Albumin: 3.2 g/dL — ABNORMAL LOW (ref 3.5–5.0)
Alkaline Phosphatase: 54 U/L (ref 38–126)
Anion gap: 11 (ref 5–15)
BUN: 21 mg/dL (ref 8–23)
CO2: 30 mmol/L (ref 22–32)
Calcium: 8.9 mg/dL (ref 8.9–10.3)
Chloride: 97 mmol/L — ABNORMAL LOW (ref 98–111)
Creatinine, Ser: 1.35 mg/dL — ABNORMAL HIGH (ref 0.44–1.00)
GFR, Estimated: 40 mL/min — ABNORMAL LOW (ref >60)
Glucose, Bld: 160 mg/dL — ABNORMAL HIGH (ref 70–99)
Potassium: 3.8 mmol/L (ref 3.5–5.1)
Sodium: 138 mmol/L (ref 135–145)
Total Bilirubin: 1.8 mg/dL — ABNORMAL HIGH (ref 0.3–1.2)
Total Protein: 6.1 g/dL — ABNORMAL LOW (ref 6.5–8.1)

## 2021-02-01 MED ORDER — HEPARIN SOD (PORK) LOCK FLUSH 100 UNIT/ML IV SOLN
250.0000 [IU] | Freq: Once | INTRAVENOUS | Status: AC
  Administered 2021-02-01: 500 [IU]
  Filled 2021-02-01: qty 5

## 2021-02-01 MED ORDER — SODIUM CHLORIDE 0.9% FLUSH
10.0000 mL | Freq: Once | INTRAVENOUS | Status: AC
  Administered 2021-02-01: 10 mL
  Filled 2021-02-01: qty 10

## 2021-02-01 NOTE — Progress Notes (Signed)
CRITICAL VALUE STICKER  CRITICAL VALUE: ANC 0.1  Geraldo Docker, RN  DATE & TIME NOTIFIED: 02/01/21 at 36  MD NOTIFIED: [name], MD  RESPONSE: MD notified, verbalized understanding.  No orders received at this time.

## 2021-02-01 NOTE — Patient Instructions (Signed)

## 2021-02-07 ENCOUNTER — Ambulatory Visit: Payer: Medicare HMO | Admitting: Cardiovascular Disease

## 2021-02-15 NOTE — Assessment & Plan Note (Signed)
01/02/2021: Screening mammogram detected left breast mass at 12 o'clock position 2.5 cm spiculated mass. Abnormal left axillary lymph node 1.3 cm. 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  Constipation: Diarrhea after milk of magnesia 2. fatigue as result of diarrhea 3.  Leukopenia: We will reduce the dosage of cycle 2 of Taxotere and Cytoxan.   RTC in 3 weeks for cycle 3

## 2021-02-15 NOTE — Progress Notes (Signed)
Patient Care Team: Laurey Morale, MD as PCP - General [name], MD as PCP - Cardiology (Cardiology) Elsie Stain, MD (Pulmonary Disease) [name], MD as Consulting Physician (General Surgery) [name], MD as Consulting Physician (General Surgery) [name], MD as Consulting Physician (Hematology and Oncology) Kyung Rudd, MD as Consulting Physician (Radiation Oncology) Rockwell Germany, RN as Oncology Nurse Navigator Mauro Kaufmann, RN as Oncology Nurse Navigator  DIAGNOSIS:    ICD-10-CM   1. Malignant neoplasm of upper-outer quadrant of left breast in female, estrogen receptor positive ([place])  C50.412    Z17.0     SUMMARY OF ONCOLOGIC HISTORY: Oncology History  Malignant neoplasm of upper-outer quadrant of left breast in female, estrogen receptor positive ([place])  01/02/2021 Initial Diagnosis   Patient experienced chronic left breast tenderness for 2 years with a negative mammogram last year. Screening mammogram showed a 2.5cm central left breast mass and one enlarged lymph node. US showed a 2.6cm mass at the 12 o'clock position and a 1.3cm mass in the left axillary tail. 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 with above-mentioned history of left breast cancer currently on neoadjuvant chemotherapy withTaxotere and Cytoxan. She presents to the clinic todayfor a  toxicity check and cycle 2.   ALLERGIES:  is allergic to codeine, eliquis [apixaban], propoxyphene hcl, meperidine hcl, and tape.  MEDICATIONS:  Current Outpatient Medications  Medication Sig Dispense Refill  . albuterol (VENTOLIN HFA) 108 (90 Base) MCG/ACT inhaler Inhale 2 puffs into the lungs every 4 (four) hours as needed for wheezing or shortness of breath. 18 g 5  . atorvastatin (LIPITOR) 10 MG tablet TAKE 1 TABLET BY MOUTH EVERY DAY (Patient taking differently: Take 10 mg by mouth daily.) 90 tablet 3  . dexamethasone (DECADRON) 4 MG tablet Take 1 tablet (4 mg total) by mouth daily. Take 1 tablet day before chemo and 1 tablet day after chemo with food 8 tablet 0  . flecainide (TAMBOCOR) 50 MG tablet TAKE 1 TABLET BY MOUTH TWICE A DAY 180 tablet 2  . latanoprost (XALATAN) 0.005 % ophthalmic solution Place 1 drop into both eyes every evening.    Marland Kitchen levothyroxine (SYNTHROID) 100 MCG tablet TAKE 1 TABLET BY MOUTH EVERY DAY (Patient taking differently: Take 100 mcg by mouth daily before breakfast.) 90 tablet 3  . lidocaine-prilocaine (EMLA) cream Apply to affected area once 30 g 3  . metoprolol succinate (TOPROL-XL) 50 MG 24 hr tablet Take 1 tablet (50 mg total) by mouth daily. Take with or immediately following a meal. 270 tablet 0  . multivitamin-iron-minerals-folic acid (CENTRUM) chewable tablet Chew 1 tablet by mouth daily.    Marland Kitchen omeprazole (PRILOSEC) 40 MG capsule TAKE 1 CAPSULE (40 MG TOTAL) BY MOUTH DAILY AS NEEDED (REFLUX). 90 capsule 3  . ondansetron (ZOFRAN) 8 MG tablet Take 1 tablet (  8 mg total) by mouth 2 (two) times daily as needed for refractory nausea / vomiting. Start on day 3 after chemo. 30 tablet 1  . prochlorperazine (COMPAZINE) 10 MG tablet Take 1 tablet (10 mg total) by mouth every 6 (six) hours as needed (Nausea or vomiting). 30 tablet 1  . Rivaroxaban (XARELTO) 15 MG TABS tablet TAKE 1 TABLET (15 MG TOTAL) BY MOUTH DAILY WITH SUPPER. (Patient taking differently: Take 15 mg  by mouth daily with supper. TAKE 1 TABLET (15 MG TOTAL) BY MOUTH DAILY WITH SUPPER.) 90 tablet 1  . timolol (BETIMOL) 0.5 % ophthalmic solution Place 1 drop into both eyes 2 (two) times daily.    Marland Kitchen triamterene-hydrochlorothiazide (DYAZIDE) 37.5-25 MG capsule Take 1 each (1 capsule total) by mouth daily. 90 capsule 3   No current facility-administered medications for this visit.    PHYSICAL EXAMINATION: ECOG PERFORMANCE STATUS: 1 - Symptomatic but completely ambulatory  Vitals:   02/16/21 1152  BP: (!) 134/59  Pulse: 61  Resp: 20  Temp: 98.1 F (36.7 C)  SpO2: 95%   Filed Weights   02/16/21 1152  Weight: 165 lb 6.4 oz (75 kg)    LABORATORY DATA:  I have reviewed the data as listed CMP Latest Ref Rng & Units 02/01/2021 01/26/2021 01/23/2021  Glucose 70 - 99 mg/dL 160(H) 172(H) 107(H)  BUN 8 - 23 mg/dL 21 29(H) 21  Creatinine 0.44 - 1.00 mg/dL 1.35(H) 1.50(H) 1.37(H)  Sodium 135 - 145 mmol/L 138 139 141  Potassium 3.5 - 5.1 mmol/L 3.8 3.7 3.5  Chloride 98 - 111 mmol/L 97(L) 102 106  CO2 22 - 32 mmol/L 30 25 29   Calcium 8.9 - 10.3 mg/dL 8.9 9.2 9.5  Total Protein 6.5 - 8.1 g/dL 6.1(L) 6.5 -  Total Bilirubin 0.3 - 1.2 mg/dL 1.8(H) 0.9 -  Alkaline Phos 38 - 126 U/L 54 54 -  AST 15 - 41 U/L 16 18 -  ALT 0 - 44 U/L 16 20 -    Lab Results  Component Value Date   WBC 9.4 02/16/2021   HGB 12.6 02/16/2021   HCT 37.7 02/16/2021   MCV 93.3 02/16/2021   PLT 228 02/16/2021   NEUTROABS 6.8 02/16/2021    ASSESSMENT & PLAN:  Malignant neoplasm of upper-outer quadrant of left breast in female, estrogen receptor positive ([place]) 01/02/2021: Screening mammogram detected left breast mass at 12 o'clock position 2.5 cm spiculated mass. Abnormal left axillary lymph node 1.3 cm. 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  Constipation: Diarrhea after milk of magnesia can add that she will try MiraLAX if she gets constipated. 2. fatigue as result of diarrhea 3.  Leukopenia: We will reduce the dosage of cycle 2 of Taxotere and Cytoxan.  RTC in 3 weeks for cycle 3    No orders of the defined types were placed in this encounter.  The patient has a good understanding of the overall plan. she agrees with it. she will call with any problems that may develop before the next visit here.  Total time spent: 30 mins including face to face time and time spent for planning, charting and coordination of care  Rulon Eisenmenger, MD, MPH 02/16/2021  I, Molly Dorshimer, am acting as scribe for Dr. [name].  I have reviewed the above documentation for accuracy and completeness, and I agree with the above.

## 2021-02-16 ENCOUNTER — Inpatient Hospital Stay: Payer: Medicare HMO

## 2021-02-16 ENCOUNTER — Ambulatory Visit: Payer: Medicare HMO | Admitting: Adult Health

## 2021-02-16 ENCOUNTER — Other Ambulatory Visit: Payer: Self-pay

## 2021-02-16 ENCOUNTER — Inpatient Hospital Stay: Payer: Medicare HMO | Admitting: Hematology and Oncology

## 2021-02-16 ENCOUNTER — Inpatient Hospital Stay: Payer: Medicare HMO | Attending: Hematology and Oncology

## 2021-02-16 DIAGNOSIS — Z5112 Encounter for antineoplastic immunotherapy: Secondary | ICD-10-CM | POA: Insufficient documentation

## 2021-02-16 DIAGNOSIS — Z170 Estrogen receptor positive status [ER+]: Secondary | ICD-10-CM

## 2021-02-16 DIAGNOSIS — C50412 Malignant neoplasm of upper-outer quadrant of left female breast: Secondary | ICD-10-CM

## 2021-02-16 DIAGNOSIS — Z5111 Encounter for antineoplastic chemotherapy: Secondary | ICD-10-CM | POA: Insufficient documentation

## 2021-02-16 DIAGNOSIS — C773 Secondary and unspecified malignant neoplasm of axilla and upper limb lymph nodes: Secondary | ICD-10-CM | POA: Insufficient documentation

## 2021-02-16 DIAGNOSIS — Z95828 Presence of other vascular implants and grafts: Secondary | ICD-10-CM

## 2021-02-16 LAB — COMPREHENSIVE METABOLIC PANEL
ALT: 16 U/L (ref 0–44)
AST: 17 U/L (ref 15–41)
Albumin: 3.3 g/dL — ABNORMAL LOW (ref 3.5–5.0)
Alkaline Phosphatase: 50 U/L (ref 38–126)
Anion gap: 9 (ref 5–15)
BUN: 18 mg/dL (ref 8–23)
CO2: 27 mmol/L (ref 22–32)
Calcium: 9 mg/dL (ref 8.9–10.3)
Chloride: 104 mmol/L (ref 98–111)
Creatinine, Ser: 1.17 mg/dL — ABNORMAL HIGH (ref 0.44–1.00)
GFR, Estimated: 47 mL/min — ABNORMAL LOW (ref >60)
Glucose, Bld: 90 mg/dL (ref 70–99)
Potassium: 3.6 mmol/L (ref 3.5–5.1)
Sodium: 140 mmol/L (ref 135–145)
Total Bilirubin: 1.2 mg/dL (ref 0.3–1.2)
Total Protein: 5.8 g/dL — ABNORMAL LOW (ref 6.5–8.1)

## 2021-02-16 LAB — CBC WITH DIFFERENTIAL/PLATELET
Abs Immature Granulocytes: 0.05 10*3/uL (ref 0.00–0.07)
Basophils Absolute: 0.1 10*3/uL (ref 0.0–0.1)
Basophils Relative: 1 %
Eosinophils Absolute: 0 10*3/uL (ref 0.0–0.5)
Eosinophils Relative: 0 %
HCT: 37.7 % (ref 36.0–46.0)
Hemoglobin: 12.6 g/dL (ref 12.0–15.0)
Immature Granulocytes: 1 %
Lymphocytes Relative: 16 %
Lymphs Abs: 1.5 10*3/uL (ref 0.7–4.0)
MCH: 31.2 pg (ref 26.0–34.0)
MCHC: 33.4 g/dL (ref 30.0–36.0)
MCV: 93.3 fL (ref 80.0–100.0)
Monocytes Absolute: 1 10*3/uL (ref 0.1–1.0)
Monocytes Relative: 10 %
Neutro Abs: 6.8 10*3/uL (ref 1.7–7.7)
Neutrophils Relative %: 72 %
Platelets: 228 10*3/uL (ref 150–400)
RBC: 4.04 MIL/uL (ref 3.87–5.11)
RDW: 14.6 % (ref 11.5–15.5)
WBC: 9.4 10*3/uL (ref 4.0–10.5)
nRBC: 0 % (ref 0.0–0.2)

## 2021-02-16 MED ORDER — SODIUM CHLORIDE 0.9% FLUSH
10.0000 mL | Freq: Once | INTRAVENOUS | Status: AC
  Administered 2021-02-16: 10 mL
  Filled 2021-02-16: qty 10

## 2021-02-16 MED ORDER — SODIUM CHLORIDE 0.9 % IV SOLN
50.0000 mg/m2 | Freq: Once | INTRAVENOUS | Status: AC
  Administered 2021-02-16: 90 mg via INTRAVENOUS
  Filled 2021-02-16: qty 9

## 2021-02-16 MED ORDER — PALONOSETRON HCL INJECTION 0.25 MG/5ML
0.2500 mg | Freq: Once | INTRAVENOUS | Status: AC
  Administered 2021-02-16: 0.25 mg via INTRAVENOUS

## 2021-02-16 MED ORDER — SODIUM CHLORIDE 0.9 % IV SOLN
Freq: Once | INTRAVENOUS | Status: AC
  Filled 2021-02-16: qty 250

## 2021-02-16 MED ORDER — PALONOSETRON HCL INJECTION 0.25 MG/5ML
INTRAVENOUS | Status: AC
  Filled 2021-02-16: qty 5

## 2021-02-16 MED ORDER — SODIUM CHLORIDE 0.9 % IV SOLN
400.0000 mg/m2 | Freq: Once | INTRAVENOUS | Status: AC
  Administered 2021-02-16: 740 mg via INTRAVENOUS
  Filled 2021-02-16: qty 37

## 2021-02-16 MED ORDER — SODIUM CHLORIDE 0.9% FLUSH
10.0000 mL | INTRAVENOUS | Status: DC | PRN
  Administered 2021-02-16: 10 mL
  Filled 2021-02-16: qty 10

## 2021-02-16 MED ORDER — HEPARIN SOD (PORK) LOCK FLUSH 100 UNIT/ML IV SOLN
500.0000 [IU] | Freq: Once | INTRAVENOUS | Status: AC | PRN
  Administered 2021-02-16: 500 [IU]
  Filled 2021-02-16: qty 5

## 2021-02-16 MED ORDER — SODIUM CHLORIDE 0.9 % IV SOLN
10.0000 mg | Freq: Once | INTRAVENOUS | Status: AC
  Administered 2021-02-16: 10 mg via INTRAVENOUS
  Filled 2021-02-16: qty 10

## 2021-02-16 NOTE — Patient Instructions (Signed)
[facility] Discharge Instructions for Patients Receiving Chemotherapy  Today you received the following chemotherapy agents: Docetaxel (Taxotere) and Cyclophosphamide (Cytoxan)  To help prevent nausea and vomiting after your treatment, we encourage you to take your nausea medication  as prescribed.    If you develop nausea and vomiting that is not controlled by your nausea medication, call the clinic.   BELOW ARE SYMPTOMS THAT SHOULD BE REPORTED IMMEDIATELY:  *FEVER GREATER THAN 100.5 F  *CHILLS WITH OR WITHOUT FEVER  NAUSEA AND VOMITING THAT IS NOT CONTROLLED WITH YOUR NAUSEA MEDICATION  *UNUSUAL SHORTNESS OF BREATH  *UNUSUAL BRUISING OR BLEEDING  TENDERNESS IN MOUTH AND THROAT WITH OR WITHOUT PRESENCE OF ULCERS  *URINARY PROBLEMS  *BOWEL PROBLEMS  UNUSUAL RASH Items with * indicate a potential emergency and should be followed up as soon as possible.  Feel free to call the clinic should you have any questions or concerns. The clinic phone number is [phone].  Please show the [place] at check-in to the Emergency Department and triage nurse.

## 2021-02-18 ENCOUNTER — Inpatient Hospital Stay: Payer: Medicare HMO

## 2021-02-18 ENCOUNTER — Other Ambulatory Visit: Payer: Self-pay

## 2021-02-18 VITALS — BP 143/67 | HR 64 | Temp 98.2°F

## 2021-02-18 DIAGNOSIS — Z5112 Encounter for antineoplastic immunotherapy: Secondary | ICD-10-CM | POA: Diagnosis not present

## 2021-02-18 DIAGNOSIS — C50412 Malignant neoplasm of upper-outer quadrant of left female breast: Secondary | ICD-10-CM

## 2021-02-18 MED ORDER — PEGFILGRASTIM-CBQV 6 MG/0.6ML ~~LOC~~ SOSY
6.0000 mg | PREFILLED_SYRINGE | Freq: Once | SUBCUTANEOUS | Status: AC
  Administered 2021-02-18: 6 mg via SUBCUTANEOUS

## 2021-02-18 NOTE — Patient Instructions (Signed)
Pegfilgrastim injection What is this medicine? PEGFILGRASTIM (PEG fil gra stim) is a long-acting granulocyte colony-stimulating factor that stimulates the growth of neutrophils, a type of white blood cell important in the body's fight against infection. It is used to reduce the incidence of fever and infection in patients with certain types of cancer who are receiving chemotherapy that affects the bone marrow, and to increase survival after being exposed to high doses of radiation. This medicine may be used for other purposes; ask your health care provider or pharmacist if you have questions. COMMON BRAND NAME(S): Fulphila, Neulasta, Nyvepria, UDENYCA, Ziextenzo What should I tell my health care provider before I take this medicine? They need to know if you have any of these conditions:  kidney disease  latex allergy  ongoing radiation therapy  sickle cell disease  skin reactions to acrylic adhesives (On-Body Injector only)  an unusual or allergic reaction to pegfilgrastim, filgrastim, other medicines, foods, dyes, or preservatives  pregnant or trying to get pregnant  breast-feeding How should I use this medicine? This medicine is for injection under the skin. If you get this medicine at home, you will be taught how to prepare and give the pre-filled syringe or how to use the On-body Injector. Refer to the patient Instructions for Use for detailed instructions. Use exactly as directed. Tell your healthcare provider immediately if you suspect that the On-body Injector may not have performed as intended or if you suspect the use of the On-body Injector resulted in a missed or partial dose. It is important that you put your used needles and syringes in a special sharps container. Do not put them in a trash can. If you do not have a sharps container, call your pharmacist or healthcare provider to get one. Talk to your pediatrician regarding the use of this medicine in children. While this drug  may be prescribed for selected conditions, precautions do apply. Overdosage: If you think you have taken too much of this medicine contact a poison control center or emergency room at once. NOTE: This medicine is only for you. Do not share this medicine with others. What if I miss a dose? It is important not to miss your dose. Call your doctor or health care professional if you miss your dose. If you miss a dose due to an On-body Injector failure or leakage, a new dose should be administered as soon as possible using a single prefilled syringe for manual use. What may interact with this medicine? Interactions have not been studied. This list may not describe all possible interactions. Give your health care provider a list of all the medicines, herbs, non-prescription drugs, or dietary supplements you use. Also tell them if you smoke, drink alcohol, or use illegal drugs. Some items may interact with your medicine. What should I watch for while using this medicine? Your condition will be monitored carefully while you are receiving this medicine. You may need blood work done while you are taking this medicine. Talk to your health care provider about your risk of cancer. You may be more at risk for certain types of cancer if you take this medicine. If you are going to need a MRI, CT scan, or other procedure, tell your doctor that you are using this medicine (On-Body Injector only). What side effects may I notice from receiving this medicine? Side effects that you should report to your doctor or health care professional as soon as possible:  allergic reactions (skin rash, itching or hives, swelling of   the face, lips, or tongue)  back pain  dizziness  fever  pain, redness, or irritation at site where injected  pinpoint red spots on the skin  red or dark-brown urine  shortness of breath or breathing problems  stomach or side pain, or pain at the shoulder  swelling  tiredness  trouble  passing urine or change in the amount of urine  unusual bruising or bleeding Side effects that usually do not require medical attention (report to your doctor or health care professional if they continue or are bothersome):  bone pain  muscle pain This list may not describe all possible side effects. Call your doctor for medical advice about side effects. You may report side effects to FDA at 1-800-FDA-1088. Where should I keep my medicine? Keep out of the reach of children. If you are using this medicine at home, you will be instructed on how to store it. Throw away any unused medicine after the expiration date on the label. NOTE: This sheet is a summary. It may not cover all possible information. If you have questions about this medicine, talk to your doctor, pharmacist, or health care provider.  2021 Elsevier/Gold Standard (2019-11-13 13:20:51)  

## 2021-03-08 NOTE — Assessment & Plan Note (Signed)
01/02/2021: Screening mammogram detected left breast mass at 12 o'clock position 2.5 cm spiculated mass. Abnormal left axillary lymph node 1.3 cm. 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, PR 0%, HER-2 equivocal by IHC, FISH negative, Ki-67 20% T2N1 stage IIIa  MammaPrint high risk  Treatment plan: 1.Neoadjuvant chemotherapy with 4 cycles of Taxotere and Cytoxan 2.followed by breast conserving surgery with targeted lymph node surgery 3.Adjuvant radiation 4.Follow-up adjuvant antiestrogen therapy with anastrozole 1 mg daily x7 years ------------------------------------------------------------------------------------------------------------ Current Treatment: Cycle 3 Taxotere and Cytoxan Chemo Toxicities: 1.Constipation: Diarrhea after milk of magnesia can add that she will try MiraLAX if she gets constipated. 2.fatigue as result of diarrhea 3.Leukopenia: We will reduce the dosage of cycle 2 of Taxotere and Cytoxan.  RTC in 3 weeks for cycle 4 

## 2021-03-08 NOTE — Progress Notes (Signed)
Patient Care Team: Laurey Morale, MD as PCP - General [name], MD as PCP - Cardiology (Cardiology) Elsie Stain, MD (Pulmonary Disease) [name], MD as Consulting Physician (General Surgery) [name], MD as Consulting Physician (General Surgery) [name], MD as Consulting Physician (Hematology and Oncology) Kyung Rudd, MD as Consulting Physician (Radiation Oncology) Rockwell Germany, RN as Oncology Nurse Navigator Mauro Kaufmann, RN as Oncology Nurse Navigator  DIAGNOSIS:    ICD-10-CM   1. Malignant neoplasm of upper-outer quadrant of left breast in female, estrogen receptor positive ([place])  C50.412    Z17.0     SUMMARY OF ONCOLOGIC HISTORY: Oncology History  Malignant neoplasm of upper-outer quadrant of left breast in female, estrogen receptor positive ([place])  01/02/2021 Initial Diagnosis   Patient experienced chronic left breast tenderness for 2 years with a negative mammogram last year. Screening mammogram showed a 2.5cm central left breast mass and one enlarged lymph node. US showed a 2.6cm mass at the 12 o'clock position and a 1.3cm mass in the left axillary tail. 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 with above-mentioned history of left breast cancer currently on neoadjuvant chemotherapy withTaxotere and Cytoxan. She presents to the clinic todayfora  toxicity check andcycle 3.She tells me that she had nausea after the chemo but did not have any emesis.  She did not take any antinausea meds because she does not like taking medications.  ALLERGIES:  is allergic to codeine, eliquis [apixaban], propoxyphene hcl, meperidine hcl, and tape.  MEDICATIONS:  Current Outpatient Medications  Medication Sig Dispense Refill  . albuterol (VENTOLIN HFA) 108 (90 Base) MCG/ACT inhaler Inhale 2 puffs into the lungs every 4 (four) hours as needed for wheezing or shortness of breath. 18 g 5  . atorvastatin (LIPITOR) 10 MG tablet TAKE 1 TABLET BY MOUTH EVERY DAY (Patient taking differently: Take 10 mg by mouth daily.) 90 tablet 3  . dexamethasone (DECADRON) 4 MG tablet Take 1 tablet (4 mg total) by mouth daily. Take 1 tablet day before chemo and 1 tablet day after chemo with food 8 tablet 0  . flecainide (TAMBOCOR) 50 MG tablet TAKE 1 TABLET BY MOUTH TWICE A DAY 180 tablet 2  . latanoprost (XALATAN) 0.005 % ophthalmic solution Place 1 drop into both eyes every evening.    Marland Kitchen levothyroxine (SYNTHROID) 100 MCG tablet TAKE 1 TABLET BY MOUTH EVERY DAY (Patient taking differently: Take 100 mcg by mouth daily before breakfast.) 90 tablet 3  . lidocaine-prilocaine (EMLA) cream Apply to affected area once 30 g 3  . metoprolol succinate (TOPROL-XL) 50 MG 24 hr tablet Take 1 tablet (50 mg total) by mouth daily. Take with or immediately following a meal. 270 tablet 0  . multivitamin-iron-minerals-folic acid (CENTRUM) chewable tablet Chew 1 tablet by mouth daily.    Marland Kitchen omeprazole (PRILOSEC) 40 MG  capsule TAKE 1 CAPSULE (40 MG TOTAL) BY MOUTH DAILY AS NEEDED (REFLUX). 90 capsule 3  . ondansetron (ZOFRAN) 8 MG tablet Take 1 tablet (8 mg total) by mouth 2 (two) times daily as needed for refractory nausea / vomiting. Start on day 3 after chemo. 30 tablet 1  . prochlorperazine (COMPAZINE) 10 MG tablet Take 1 tablet (10 mg total) by mouth every 6 (six) hours as needed (Nausea or  vomiting). 30 tablet 1  . Rivaroxaban (XARELTO) 15 MG TABS tablet TAKE 1 TABLET (15 MG TOTAL) BY MOUTH DAILY WITH SUPPER. (Patient taking differently: Take 15 mg by mouth daily with supper. TAKE 1 TABLET (15 MG TOTAL) BY MOUTH DAILY WITH SUPPER.) 90 tablet 1  . timolol (BETIMOL) 0.5 % ophthalmic solution Place 1 drop into both eyes 2 (two) times daily.    Marland Kitchen triamterene-hydrochlorothiazide (DYAZIDE) 37.5-25 MG capsule Take 1 each (1 capsule total) by mouth daily. 90 capsule 3   No current facility-administered medications for this visit.    PHYSICAL EXAMINATION: ECOG PERFORMANCE STATUS: 1 - Symptomatic but completely ambulatory  Vitals:   03/09/21 0900  BP: 126/80  Pulse: 63  Resp: 18  Temp: (!) 97.2 F (36.2 C)  SpO2: 94%   Filed Weights   03/09/21 0900  Weight: 163 lb 14.4 oz (74.3 kg)    LABORATORY DATA:  I have reviewed the data as listed CMP Latest Ref Rng & Units 03/09/2021 02/16/2021 02/01/2021  Glucose 70 - 99 mg/dL 132(H) 90 160(H)  BUN 8 - 23 mg/dL 19 18 21   Creatinine 0.44 - 1.00 mg/dL 1.33(H) 1.17(H) 1.35(H)  Sodium 135 - 145 mmol/L 141 140 138  Potassium 3.5 - 5.1 mmol/L 3.3(L) 3.6 3.8  Chloride 98 - 111 mmol/L 105 104 97(L)  CO2 22 - 32 mmol/L 25 27 30   Calcium 8.9 - 10.3 mg/dL 8.8(L) 9.0 8.9  Total Protein 6.5 - 8.1 g/dL 5.8(L) 5.8(L) 6.1(L)  Total Bilirubin 0.3 - 1.2 mg/dL 1.1 1.2 1.8(H)  Alkaline Phos 38 - 126 U/L 55 50 54  AST 15 - 41 U/L 19 17 16   ALT 0 - 44 U/L 16 16 16     Lab Results  Component Value Date   WBC 6.4 03/09/2021   HGB 11.7 (L) 03/09/2021   HCT 35.1 (L) 03/09/2021   MCV 94.4 03/09/2021   PLT 142 (L) 03/09/2021   NEUTROABS PENDING 03/09/2021    ASSESSMENT & PLAN:  Malignant neoplasm of upper-outer quadrant of left breast in female, estrogen receptor positive ([place]) 01/02/2021: Screening mammogram detected left breast mass at 12 o'clock position 2.5 cm spiculated mass. Abnormal left axillary lymph node 1.3 cm. 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 2.fatigue as result of diarrhea 3.Leukopenia: We will reduce the dosage of cycle 2 of Taxotere and Cytoxan. 4.  Nausea issues: I instructed her to take Zofran and Compazine at the earliest sign of nausea and not to wait for later. 5.  Nutrition: I encouraged her to eat more protein in her diet.  We discussed different options.  RTC in 3 weeks for cycle 4    No orders of the defined types were placed in this encounter.  The patient has a good understanding of the overall plan. she agrees with it. she will call with any problems that may develop before the next visit here.  Total time spent: 30 mins including face to face time and time spent for planning, charting and coordination of care  Rulon Eisenmenger, MD, MPH 03/09/2021  I, Cloyde Reams Dorshimer, am acting as scribe for Dr. [name].  I have reviewed the above documentation for accuracy and completeness, and I agree with the above.

## 2021-03-09 ENCOUNTER — Inpatient Hospital Stay: Payer: Medicare HMO

## 2021-03-09 ENCOUNTER — Other Ambulatory Visit: Payer: Self-pay | Admitting: *Deleted

## 2021-03-09 ENCOUNTER — Inpatient Hospital Stay: Payer: Medicare HMO | Attending: Hematology and Oncology

## 2021-03-09 ENCOUNTER — Other Ambulatory Visit: Payer: Medicare HMO

## 2021-03-09 ENCOUNTER — Other Ambulatory Visit: Payer: Self-pay

## 2021-03-09 ENCOUNTER — Inpatient Hospital Stay: Payer: Medicare HMO | Admitting: Hematology and Oncology

## 2021-03-09 ENCOUNTER — Encounter: Payer: Self-pay | Admitting: *Deleted

## 2021-03-09 DIAGNOSIS — C50412 Malignant neoplasm of upper-outer quadrant of left female breast: Secondary | ICD-10-CM

## 2021-03-09 DIAGNOSIS — Z5111 Encounter for antineoplastic chemotherapy: Secondary | ICD-10-CM | POA: Diagnosis not present

## 2021-03-09 DIAGNOSIS — Z170 Estrogen receptor positive status [ER+]: Secondary | ICD-10-CM

## 2021-03-09 DIAGNOSIS — Z5189 Encounter for other specified aftercare: Secondary | ICD-10-CM | POA: Insufficient documentation

## 2021-03-09 DIAGNOSIS — D72819 Decreased white blood cell count, unspecified: Secondary | ICD-10-CM | POA: Diagnosis not present

## 2021-03-09 DIAGNOSIS — Z95828 Presence of other vascular implants and grafts: Secondary | ICD-10-CM

## 2021-03-09 LAB — COMPREHENSIVE METABOLIC PANEL
ALT: 16 U/L (ref 0–44)
AST: 19 U/L (ref 15–41)
Albumin: 3.2 g/dL — ABNORMAL LOW (ref 3.5–5.0)
Alkaline Phosphatase: 55 U/L (ref 38–126)
Anion gap: 11 (ref 5–15)
BUN: 19 mg/dL (ref 8–23)
CO2: 25 mmol/L (ref 22–32)
Calcium: 8.8 mg/dL — ABNORMAL LOW (ref 8.9–10.3)
Chloride: 105 mmol/L (ref 98–111)
Creatinine, Ser: 1.33 mg/dL — ABNORMAL HIGH (ref 0.44–1.00)
GFR, Estimated: 41 mL/min — ABNORMAL LOW (ref >60)
Glucose, Bld: 132 mg/dL — ABNORMAL HIGH (ref 70–99)
Potassium: 3.3 mmol/L — ABNORMAL LOW (ref 3.5–5.1)
Sodium: 141 mmol/L (ref 135–145)
Total Bilirubin: 1.1 mg/dL (ref 0.3–1.2)
Total Protein: 5.8 g/dL — ABNORMAL LOW (ref 6.5–8.1)

## 2021-03-09 LAB — CBC WITH DIFFERENTIAL/PLATELET
Abs Immature Granulocytes: 0.05 10*3/uL (ref 0.00–0.07)
Basophils Absolute: 0 10*3/uL (ref 0.0–0.1)
Basophils Relative: 1 %
Eosinophils Absolute: 0 10*3/uL (ref 0.0–0.5)
Eosinophils Relative: 0 %
HCT: 35.1 % — ABNORMAL LOW (ref 36.0–46.0)
Hemoglobin: 11.7 g/dL — ABNORMAL LOW (ref 12.0–15.0)
Immature Granulocytes: 1 %
Lymphocytes Relative: 20 %
Lymphs Abs: 1.2 10*3/uL (ref 0.7–4.0)
MCH: 31.5 pg (ref 26.0–34.0)
MCHC: 33.3 g/dL (ref 30.0–36.0)
MCV: 94.4 fL (ref 80.0–100.0)
Monocytes Absolute: 1.2 10*3/uL — ABNORMAL HIGH (ref 0.1–1.0)
Monocytes Relative: 19 %
Neutro Abs: 3.8 10*3/uL (ref 1.7–7.7)
Neutrophils Relative %: 59 %
Platelets: 142 10*3/uL — ABNORMAL LOW (ref 150–400)
RBC: 3.72 MIL/uL — ABNORMAL LOW (ref 3.87–5.11)
RDW: 15.9 % — ABNORMAL HIGH (ref 11.5–15.5)
WBC: 6.4 10*3/uL (ref 4.0–10.5)
nRBC: 0 % (ref 0.0–0.2)

## 2021-03-09 MED ORDER — SODIUM CHLORIDE 0.9 % IV SOLN
400.0000 mg/m2 | Freq: Once | INTRAVENOUS | Status: AC
  Administered 2021-03-09: 740 mg via INTRAVENOUS
  Filled 2021-03-09: qty 37

## 2021-03-09 MED ORDER — SODIUM CHLORIDE 0.9% FLUSH
10.0000 mL | INTRAVENOUS | Status: DC | PRN
  Administered 2021-03-09: 10 mL
  Filled 2021-03-09: qty 10

## 2021-03-09 MED ORDER — SODIUM CHLORIDE 0.9 % IV SOLN
10.0000 mg | Freq: Once | INTRAVENOUS | Status: AC
  Administered 2021-03-09: 10 mg via INTRAVENOUS
  Filled 2021-03-09: qty 10

## 2021-03-09 MED ORDER — SODIUM CHLORIDE 0.9 % IV SOLN
Freq: Once | INTRAVENOUS | Status: AC
  Filled 2021-03-09: qty 250

## 2021-03-09 MED ORDER — PALONOSETRON HCL INJECTION 0.25 MG/5ML
0.2500 mg | Freq: Once | INTRAVENOUS | Status: AC
  Administered 2021-03-09: 0.25 mg via INTRAVENOUS

## 2021-03-09 MED ORDER — HEPARIN SOD (PORK) LOCK FLUSH 100 UNIT/ML IV SOLN
500.0000 [IU] | Freq: Once | INTRAVENOUS | Status: AC | PRN
  Administered 2021-03-09: 500 [IU]
  Filled 2021-03-09: qty 5

## 2021-03-09 MED ORDER — PALONOSETRON HCL INJECTION 0.25 MG/5ML
INTRAVENOUS | Status: AC
  Filled 2021-03-09: qty 5

## 2021-03-09 MED ORDER — SODIUM CHLORIDE 0.9% FLUSH
10.0000 mL | Freq: Once | INTRAVENOUS | Status: AC
  Administered 2021-03-09: 10 mL
  Filled 2021-03-09: qty 10

## 2021-03-09 MED ORDER — COLD PACK MISC ONCOLOGY
1.0000 | Freq: Once | Status: DC | PRN
  Filled 2021-03-09: qty 1

## 2021-03-09 MED ORDER — SODIUM CHLORIDE 0.9 % IV SOLN
50.0000 mg/m2 | Freq: Once | INTRAVENOUS | Status: AC
  Administered 2021-03-09: 90 mg via INTRAVENOUS
  Filled 2021-03-09: qty 9

## 2021-03-09 NOTE — Patient Instructions (Signed)
[facility] MEDICAL [facility]COLOG[facility]  Discharge Instructions: Thank you for choosing [facility] to provide your oncology and hematology care.   If you have a lab appointment with the Cancer Center, please go directly to the Cancer Center and check in at the registration area.   Wear comfortable clothing and clothing appropriate for easy access to any Portacath or PICC line.   We strive to give you quality time with your provider. You may need to reschedule your appointment if you arrive late (15 or more minutes).  Arriving late affects you and other patients whose appointments are after yours.  Also, if you miss three or more appointments without notifying the office, you may be dismissed from the clinic at the provider's discretion.      For prescription refill requests, have your pharmacy contact our office and allow 72 hours for refills to be completed.    Today you received the following chemotherapy and/or immunotherapy agents : Taxotere, Cytoxan     To help prevent nausea and vomiting after your treatment, we encourage you to take your nausea medication as directed.  BELOW ARE SYMPTOMS THAT SHOULD BE REPORTED IMMEDIATELY: *FEVER GREATER THAN 100.4 F (38 C) OR HIGHER *CHILLS OR SWEATING *NAUSEA AND VOMITING THAT IS NOT CONTROLLED WITH YOUR NAUSEA MEDICATION *UNUSUAL SHORTNESS OF BREATH *UNUSUAL BRUISING OR BLEEDING *URINARY PROBLEMS (pain or burning when urinating, or frequent urination) *BOWEL PROBLEMS (unusual diarrhea, constipation, pain near the anus) TENDERNESS IN MOUTH AND THROAT WITH OR WITHOUT PRESENCE OF ULCERS (sore throat, sores in mouth, or a toothache) UNUSUAL RASH, SWELLING OR PAIN  UNUSUAL VAGINAL DISCHARGE OR ITCHING   Items with * indicate a potential emergency and should be followed up as soon as possible or go to the Emergency Department if any problems should occur.  Please show the CHEMOTHERAPY ALERT CARD or IMMUNOTHERAPY ALERT CARD at  check-in to the Emergency Department and triage nurse.  Should you have questions after your visit or need to cancel or reschedule your appointment, please contact [facility]  Dept: [phone]  and follow the prompts.  Office hours are 8:00 a.m. to 4:30 p.m. Monday - Friday. Please note that voicemails left after 4:00 p.m. may not be returned until the following business day.  We are closed weekends and major holidays. You have access to a nurse at all times for urgent questions. Please call the main number to the clinic Dept: [phone] and follow the prompts.   For any non-urgent questions, you may also contact your provider using MyChart. We now offer e-Visits for anyone 18 and older to request care online for non-urgent symptoms. For details visit [email or web address].   Also download the MyChart app! Go to the app store, search "MyChart", open the app, select [facility], and log in with your MyChart username and password.  Due to Covid, a mask is required upon entering the hospital/clinic. If you do not have a mask, one will be given to you upon arrival. For doctor visits, patients may have 1 support person aged 18 or older with them. For treatment visits, patients cannot have anyone with them due to current Covid guidelines and our immunocompromised population.   

## 2021-03-11 ENCOUNTER — Inpatient Hospital Stay: Payer: Medicare HMO

## 2021-03-11 ENCOUNTER — Other Ambulatory Visit: Payer: Self-pay

## 2021-03-11 VITALS — BP 141/55 | HR 87 | Temp 99.1°F | Resp 20

## 2021-03-11 DIAGNOSIS — C50412 Malignant neoplasm of upper-outer quadrant of left female breast: Secondary | ICD-10-CM

## 2021-03-11 DIAGNOSIS — Z5111 Encounter for antineoplastic chemotherapy: Secondary | ICD-10-CM | POA: Diagnosis not present

## 2021-03-11 MED ORDER — PEGFILGRASTIM-CBQV 6 MG/0.6ML ~~LOC~~ SOSY
6.0000 mg | PREFILLED_SYRINGE | Freq: Once | SUBCUTANEOUS | Status: AC
  Administered 2021-03-11: 6 mg via SUBCUTANEOUS

## 2021-03-11 NOTE — Patient Instructions (Signed)
Pegfilgrastim injection What is this medicine? PEGFILGRASTIM (PEG fil gra stim) is a long-acting granulocyte colony-stimulating factor that stimulates the growth of neutrophils, a type of white blood cell important in the body's fight against infection. It is used to reduce the incidence of fever and infection in patients with certain types of cancer who are receiving chemotherapy that affects the bone marrow, and to increase survival after being exposed to high doses of radiation. This medicine may be used for other purposes; ask your health care provider or pharmacist if you have questions. COMMON BRAND NAME(S): Fulphila, Neulasta, Nyvepria, UDENYCA, Ziextenzo What should I tell my health care provider before I take this medicine? They need to know if you have any of these conditions:  kidney disease  latex allergy  ongoing radiation therapy  sickle cell disease  skin reactions to acrylic adhesives (On-Body Injector only)  an unusual or allergic reaction to pegfilgrastim, filgrastim, other medicines, foods, dyes, or preservatives  pregnant or trying to get pregnant  breast-feeding How should I use this medicine? This medicine is for injection under the skin. If you get this medicine at home, you will be taught how to prepare and give the pre-filled syringe or how to use the On-body Injector. Refer to the patient Instructions for Use for detailed instructions. Use exactly as directed. Tell your healthcare provider immediately if you suspect that the On-body Injector may not have performed as intended or if you suspect the use of the On-body Injector resulted in a missed or partial dose. It is important that you put your used needles and syringes in a special sharps container. Do not put them in a trash can. If you do not have a sharps container, call your pharmacist or healthcare provider to get one. Talk to your pediatrician regarding the use of this medicine in children. While this drug  may be prescribed for selected conditions, precautions do apply. Overdosage: If you think you have taken too much of this medicine contact a poison control center or emergency room at once. NOTE: This medicine is only for you. Do not share this medicine with others. What if I miss a dose? It is important not to miss your dose. Call your doctor or health care professional if you miss your dose. If you miss a dose due to an On-body Injector failure or leakage, a new dose should be administered as soon as possible using a single prefilled syringe for manual use. What may interact with this medicine? Interactions have not been studied. This list may not describe all possible interactions. Give your health care provider a list of all the medicines, herbs, non-prescription drugs, or dietary supplements you use. Also tell them if you smoke, drink alcohol, or use illegal drugs. Some items may interact with your medicine. What should I watch for while using this medicine? Your condition will be monitored carefully while you are receiving this medicine. You may need blood work done while you are taking this medicine. Talk to your health care provider about your risk of cancer. You may be more at risk for certain types of cancer if you take this medicine. If you are going to need a MRI, CT scan, or other procedure, tell your doctor that you are using this medicine (On-Body Injector only). What side effects may I notice from receiving this medicine? Side effects that you should report to your doctor or health care professional as soon as possible:  allergic reactions (skin rash, itching or hives, swelling of   the face, lips, or tongue)  back pain  dizziness  fever  pain, redness, or irritation at site where injected  pinpoint red spots on the skin  red or dark-brown urine  shortness of breath or breathing problems  stomach or side pain, or pain at the shoulder  swelling  tiredness  trouble  passing urine or change in the amount of urine  unusual bruising or bleeding Side effects that usually do not require medical attention (report to your doctor or health care professional if they continue or are bothersome):  bone pain  muscle pain This list may not describe all possible side effects. Call your doctor for medical advice about side effects. You may report side effects to FDA at 1-800-FDA-1088. Where should I keep my medicine? Keep out of the reach of children. If you are using this medicine at home, you will be instructed on how to store it. Throw away any unused medicine after the expiration date on the label. NOTE: This sheet is a summary. It may not cover all possible information. If you have questions about this medicine, talk to your doctor, pharmacist, or health care provider.  2021 Elsevier/Gold Standard (2019-11-13 13:20:51)  

## 2021-03-13 ENCOUNTER — Telehealth: Payer: Self-pay | Admitting: *Deleted

## 2021-03-13 NOTE — Telephone Encounter (Signed)
Called pt and confirmed appt with Dr. Donne Hazel on 6/1 arrive at 3:15pm Provided address and directions for his office. No needs voiced at this time.

## 2021-03-14 ENCOUNTER — Telehealth: Payer: Self-pay | Admitting: Hematology and Oncology

## 2021-03-14 NOTE — Telephone Encounter (Signed)
Scheduled appts per 5/9 sch msg. Pt aware.  

## 2021-03-16 ENCOUNTER — Encounter: Payer: Self-pay | Admitting: *Deleted

## 2021-03-17 ENCOUNTER — Telehealth: Payer: Self-pay | Admitting: *Deleted

## 2021-03-17 NOTE — Telephone Encounter (Signed)
Received call from pt with complaint of bilateral hand itching and itching behind bilateral knees.  Pt denies rash, redness or irritation.  Upon further investigation, pt states she has recently started using a different type of soap.  RN educated pt to stop using the new soap and apply benadryl cream or hydrocortisone cream to affected areas. Pt verbalized understanding and appreciative of advice.

## 2021-03-18 ENCOUNTER — Encounter (HOSPITAL_COMMUNITY): Payer: Self-pay

## 2021-03-18 ENCOUNTER — Encounter: Payer: Self-pay | Admitting: Hematology and Oncology

## 2021-03-18 ENCOUNTER — Ambulatory Visit (HOSPITAL_COMMUNITY)
Admission: EM | Admit: 2021-03-18 | Discharge: 2021-03-18 | Disposition: A | Discharge status: Home / Self Care | Payer: Medicare HMO | Attending: Family Medicine | Admitting: Family Medicine

## 2021-03-18 DIAGNOSIS — L239 Allergic contact dermatitis, unspecified cause: Secondary | ICD-10-CM | POA: Diagnosis not present

## 2021-03-18 MED ORDER — METHYLPREDNISOLONE SODIUM SUCC 125 MG IJ SOLR
80.0000 mg | Freq: Once | INTRAMUSCULAR | Status: AC
  Administered 2021-03-18: 80 mg via INTRAMUSCULAR

## 2021-03-18 MED ORDER — FAMOTIDINE 20 MG PO TABS: 20.0000 mg | ORAL_TABLET | Freq: Two times a day (BID) | ORAL | 0 refills | Status: DC

## 2021-03-18 MED ORDER — METHYLPREDNISOLONE SODIUM SUCC 125 MG IJ SOLR
INTRAMUSCULAR | Status: AC
  Filled 2021-03-18: qty 2

## 2021-03-18 MED ORDER — TRIAMCINOLONE ACETONIDE 0.1 % EX CREA: 1.0000 "application " | TOPICAL_CREAM | Freq: Two times a day (BID) | CUTANEOUS | 0 refills | Status: DC

## 2021-03-18 NOTE — ED Triage Notes (Signed)
Pt presents with itchy blotches in different areas of her body from unknown source; pt states she has had a change in her medication from her chemo in the past week.

## 2021-03-18 NOTE — ED Provider Notes (Signed)
[place]    CSN: [id number] Arrival date & time: 03/18/21  1311      History   Chief Complaint Chief Complaint  Patient presents with  . Rash    HPI Christina Jacobs is a 80 y.o. female.   Patient presenting today with severely pruritic patchy rash that appeared 2 days ago, starting on her hands and spreading up arms and now across entire body, sparing face.  Sometimes areas are very swollen but seem to wax and wane with Benadryl dosing.  States she just did her third chemoinfusion prior to onset, had never had an issue in the past from this.  No other new medications, foods, exposures, recent travel.  Has been taking Benadryl every 4 hours which does seem to help stave off symptoms temporarily.  Denies any chest tightness, shortness of breath, throat scratching or swelling, nausea, vomiting.  Complicated medical history including myasthenia gravis, atrial fibrillation, asthma, anxiety, chronic kidney disease, breast cancer.     Past Medical History:  Diagnosis Date  . Adenomatous colon polyp   . Anal fissure   . Anxiety   . Asthma    Dr. Halford Chessman  . Atrial fibrillation ([place])   . Bowel obstruction ([place])   . Bursitis   . CKD (chronic kidney disease) stage 3, GFR 30-59 ml/min (HCC)    sees Dr. [name]   . Complication of anesthesia    post op N/V  . Diverticulosis   . Dyspnea    After a meal  . Dysrhythmia    Afib  . Glaucoma   . Hiatal hernia    sees Dr. [name] at [facility]   . Hyperlipidemia   . Hypertension   . Hypothyroidism   . Myasthenia gravis ([place]) 2013   followed at [facility] Dr Scheryl Marten, diplopia only  . Osteoarthritis    see's Dr. [name]  . Osteoporosis    last DEXA 08-03-11    Patient Active Problem List   Diagnosis Date Noted  . Port-A-Cath in place 01/26/2021  . Malignant neoplasm of upper-outer quadrant of left breast in female, estrogen receptor positive ([place]) 01/02/2021  . Paroxysmal atrial fibrillation ([place])  08/19/2019  . CKD (chronic kidney disease) stage 3, GFR 30-59 ml/min (HCC) 08/09/2017  . Hiatal hernia 08/09/2017  . Glaucoma 08/08/2016  . Restrictive lung disease 09/29/2013  . Pulmonary nodule, right 07/14/2012  . Myasthenia gravis ([place]) 07/14/2012  . HERNIA 07/17/2010  . DIVERTICULOSIS, COLON 07/14/2010  . 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 EXTERNAL HEMORRHOIDS 07/13/2009  . GERD 02/16/2009  . Osteoarthrosis, unspecified whether generalized or localized, unspecified site 07/01/2008  . MITRAL VALVE PROLAPSE 11/17/2007  . Hypothyroidism 07/16/2007  . Dyslipidemia 07/16/2007  . Essential hypertension 07/16/2007  . Osteoporosis 07/16/2007    Past Surgical History:  Procedure Laterality Date  . ABDOMINAL HYSTERECTOMY    . APPENDECTOMY    . BREAST LUMPECTOMY Bilateral     several, enign breast lumps removed  . CARDIOVERSION N/A 12/31/2017   Procedure: CARDIOVERSION;  Surgeon: Josue Hector, MD;  Location: [facility] ENDOSCOPY;  Service: Cardiovascular;  Laterality: N/A;  . CARDIOVERSION N/A 02/11/2018   Procedure: CARDIOVERSION;  Surgeon: Josue Hector, MD;  Location: [facility] ENDOSCOPY;  Service: Cardiovascular;  Laterality: N/A;  . COLONOSCOPY  09/27/2010   per Dr. Sharlett Iles, benign polyp, no repeats needed   . HERNIA REPAIR  10-18-10   Dr. Hassell Done attempted but could reduce this, the entire stomach  is above the diaphragm  . PORTACATH PLACEMENT Right 01/25/2021   Procedure: INSERTION PORT-A-CATH;  Surgeon: [name], MD;  Location: [facility] ORS;  Service: General;  Laterality: Right;  START TIME OF 8:30 AM FOR 60 MINUTES ROOM 4 [place] [facility]  . THYROIDECTOMY, PARTIAL  1999  . TONSILLECTOMY      OB History   No obstetric history on file.      Home Medications    Prior to Admission medications   Medication Sig Start Date End Date Taking? Authorizing Provider  famotidine (PEPCID) 20 MG tablet Take 1 tablet (20 mg total) by mouth 2  (two) times daily. 03/18/21  Yes Volney American, PA-C  triamcinolone cream (KENALOG) 0.1 % Apply 1 application topically 2 (two) times daily. 03/18/21  Yes Volney American, PA-C  albuterol (VENTOLIN HFA) 108 (90 Base) MCG/ACT inhaler Inhale 2 puffs into the lungs every 4 (four) hours as needed for wheezing or shortness of breath. 08/23/20   Laurey Morale, MD  atorvastatin (LIPITOR) 10 MG tablet TAKE 1 TABLET BY MOUTH EVERY DAY Patient taking differently: Take 10 mg by mouth daily. 08/15/20   Laurey Morale, MD  dexamethasone (DECADRON) 4 MG tablet Take 1 tablet (4 mg total) by mouth daily. Take 1 tablet day before chemo and 1 tablet day after chemo with food 01/12/21   [name], MD  flecainide (TAMBOCOR) 50 MG tablet TAKE 1 TABLET BY MOUTH TWICE A DAY 01/24/21   Josue Hector, MD  latanoprost (XALATAN) 0.005 % ophthalmic solution Place 1 drop into both eyes every evening.    [provider]  levothyroxine (SYNTHROID) 100 MCG tablet TAKE 1 TABLET BY MOUTH EVERY DAY Patient taking differently: Take 100 mcg by mouth daily before breakfast. 08/15/20   Laurey Morale, MD  lidocaine-prilocaine (EMLA) cream Apply to affected area once 01/12/21   [name], MD  metoprolol succinate (TOPROL-XL) 50 MG 24 hr tablet Take 1 tablet (50 mg total) by mouth daily. Take with or immediately following a meal. 08/23/20   Laurey Morale, MD  multivitamin-iron-minerals-folic acid (CENTRUM) chewable tablet Chew 1 tablet by mouth daily.    [provider]  omeprazole (PRILOSEC) 40 MG capsule TAKE 1 CAPSULE (40 MG TOTAL) BY MOUTH DAILY AS NEEDED (REFLUX). 08/22/20   Laurey Morale, MD  ondansetron (ZOFRAN) 8 MG tablet Take 1 tablet (8 mg total) by mouth 2 (two) times daily as needed for refractory nausea / vomiting. Start on day 3 after chemo. 01/12/21   [name], MD  prochlorperazine (COMPAZINE) 10 MG tablet Take 1 tablet (10 mg total) by mouth every 6 (six) hours as needed (Nausea  or vomiting). 01/12/21   [name], MD  Rivaroxaban (XARELTO) 15 MG TABS tablet TAKE 1 TABLET (15 MG TOTAL) BY MOUTH DAILY WITH SUPPER. Patient taking differently: Take 15 mg by mouth daily with supper. TAKE 1 TABLET (15 MG TOTAL) BY MOUTH DAILY WITH SUPPER. 11/10/20   Josue Hector, MD  timolol (BETIMOL) 0.5 % ophthalmic solution Place 1 drop into both eyes 2 (two) times daily.    [provider]  triamterene-hydrochlorothiazide (DYAZIDE) 37.5-25 MG capsule Take 1 each (1 capsule total) by mouth daily. 08/20/20 08/20/21  Laurey Morale, MD    Family History Family History  Problem Relation Age of Onset  . Breast cancer Maternal Grandmother   . Diabetes Father   . Hyperlipidemia Father   . Hypertension Father   . Cancer Father  origin unknown  . Kidney cancer Mother   . Clotting disorder Brother   . Cancer Sister        2015, anal ca  . Colon cancer Son 73    Social History Social History   Tobacco Use  . Smoking status: Never Smoker  . Smokeless tobacco: Never Used  Vaping Use  . Vaping Use: Never used  Substance Use Topics  . Alcohol use: No    Alcohol/week: 0.0 standard drinks  . Drug use: No     Allergies   Codeine, Eliquis [apixaban], Propoxyphene hcl, Meperidine hcl, and Tape   Review of Systems Review of Systems Per HPI Physical Exam Triage Vital Signs ED Triage Vitals  Enc Vitals Group     BP 03/18/21 1357 122/80     Pulse Rate 03/18/21 1357 84     Resp 03/18/21 1357 16     Temp 03/18/21 1357 97.8 F (36.6 C)     Temp Source 03/18/21 1357 Oral     SpO2 03/18/21 1357 94 %     Weight --      Height --      Head Circumference --      Peak Flow --      Pain Score 03/18/21 1356 0     Pain Loc --      Pain Edu? --      Excl. in [place]? --    No data found.  Updated Vital Signs BP 122/80 (BP Location: Right Arm)   Pulse 84   Temp 97.8 F (36.6 C) (Oral)   Resp 16   SpO2 94%   Visual Acuity Right Eye Distance:   Left Eye  Distance:   Bilateral Distance:    Right Eye Near:   Left Eye Near:    Bilateral Near:     Physical Exam Vitals and nursing note reviewed.  Constitutional:      Appearance: Normal appearance. She is not ill-appearing.  HENT:     Head: Atraumatic.     Mouth/Throat:     Mouth: Mucous membranes are moist.     Pharynx: Oropharynx is clear. No oropharyngeal exudate or posterior oropharyngeal erythema.     Comments: Oral airway patent Eyes:     Extraocular Movements: Extraocular movements intact.     Conjunctiva/sclera: Conjunctivae normal.  Cardiovascular:     Rate and Rhythm: Normal rate and regular rhythm.     Heart sounds: Normal heart sounds.  Pulmonary:     Effort: Pulmonary effort is normal.     Breath sounds: Normal breath sounds. No wheezing or rales.  Musculoskeletal:        General: Normal range of motion.     Cervical back: Normal range of motion and neck supple.  Skin:    General: Skin is warm and dry.     Findings: Rash present.     Comments: Patchy erythematous maculopapular rash across majority of body, worse on the back at this time.  Neurological:     Mental Status: She is alert and oriented to person, place, and time.  Psychiatric:        Mood and Affect: Mood normal.        Thought Content: Thought content normal.        Judgment: Judgment normal.      UC Treatments / Results  Labs (all labs ordered are listed, but only abnormal results are displayed) Labs Reviewed - No data to display  EKG   Radiology No results  found.  Procedures Procedures (including critical care time)  Medications Ordered in UC Medications  methylPREDNISolone sodium succinate (SOLU-MEDROL) 125 mg/2 mL injection 80 mg (80 mg Intramuscular Given 03/18/21 1438)    Initial Impression / Assessment and Plan / UC Course  I have reviewed the triage vital signs and the nursing notes.  Pertinent labs & imaging results that were available during my care of the patient were  reviewed by me and considered in my medical decision making (see chart for details).     Diffuse hives rash, likely secondary to allergic reaction but unclear etiology.  Possibly a reaction to her chemotherapy infusion.  Already on 4 mg Decadron for myasthenia gravis, will also add IM Solu-Medrol in clinic, triamcinolone cream topically and Pepcid, Zyrtec in addition to her nightly Benadryl.  Close oncology follow-up first thing Monday morning recommended.  Go to the ER if your reaction is worsening prior to this.  Final Clinical Impressions(s) / UC Diagnoses   Final diagnoses:  Allergic dermatitis   Discharge Instructions   None    ED Prescriptions    Medication Sig Dispense Auth. Provider   famotidine (PEPCID) 20 MG tablet Take 1 tablet (20 mg total) by mouth 2 (two) times daily. 60 tablet Volney American, [place]   triamcinolone cream (KENALOG) 0.1 % Apply 1 application topically 2 (two) times daily. 90 g Volney American, [place]     PDMP not reviewed this encounter.   Volney American, [place] 03/18/21 1450

## 2021-03-20 ENCOUNTER — Telehealth: Payer: Self-pay

## 2021-03-20 ENCOUNTER — Other Ambulatory Visit: Payer: Self-pay

## 2021-03-20 MED ORDER — METHYLPREDNISOLONE 4 MG PO TBPK: ORAL_TABLET | ORAL | 0 refills | Status: DC

## 2021-03-20 NOTE — Progress Notes (Signed)
This LPN spoke with pt regarding rash. Pt is taking Prednisone 1 mg 4 tabs PO QD for Myasthenia Gravis so she is unable to take methylprednisone dosed pack. Pt has been directed to take Benadryl QHS as well as the pepcid and to call us if sx have not improved. Pt verbalized thanks and understanding.

## 2021-03-20 NOTE — Telephone Encounter (Signed)
This LPN called pt's husband, Juanda Crumble in regards to message sent over the weekend. Juanda Crumble states he took pt to o/p clinic over the weekend where they prescribed pepcid and kenalog cream. Pt is still experiencing rash but states swelling as decreased. Please advise

## 2021-03-27 ENCOUNTER — Other Ambulatory Visit: Payer: Self-pay | Admitting: Family Medicine

## 2021-03-29 MED FILL — Dexamethasone Sodium Phosphate Inj 100 MG/10ML: INTRAMUSCULAR | Qty: 1 | Status: AC

## 2021-03-29 NOTE — Progress Notes (Signed)
Patient Care Team: Laurey Morale, MD as PCP - General [name], MD as PCP - Cardiology (Cardiology) Elsie Stain, MD (Pulmonary Disease) [name], MD as Consulting Physician (General Surgery) [name], MD as Consulting Physician (General Surgery) [name], MD as Consulting Physician (Hematology and Oncology) Kyung Rudd, MD as Consulting Physician (Radiation Oncology) Rockwell Germany, RN as Oncology Nurse Navigator Mauro Kaufmann, RN as Oncology Nurse Navigator  DIAGNOSIS:    ICD-10-CM   1. Malignant neoplasm of upper-outer quadrant of left breast in female, estrogen receptor positive ([place])  C50.412    Z17.0     SUMMARY OF ONCOLOGIC HISTORY: Oncology History  Malignant neoplasm of upper-outer quadrant of left breast in female, estrogen receptor positive ([place])  01/02/2021 Initial Diagnosis   Patient experienced chronic left breast tenderness for 2 years with a negative mammogram last year. Screening mammogram showed a 2.5cm central left breast mass and one enlarged lymph node. US showed a 2.6cm mass at the 12 o'clock position and a 1.3cm mass in the left axillary tail. 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 with above-mentioned history of left breast cancer currently on neoadjuvant chemotherapy withTaxotere and Cytoxan. She presents to the clinic todayfora  toxicity checkandcycle4.She developed hives after cycle 3 of chemo on day 10.  She went to urgent care and they gave her a steroid injection and Benadryl and it appears to have improved after the first couple of days.  She also had profound nausea and was very uncomfortable for over a week.  She received antinausea medications and it has helped her.  ALLERGIES:  is allergic to codeine, eliquis [apixaban], propoxyphene hcl, meperidine hcl, and tape.  MEDICATIONS:  Current Outpatient Medications  Medication Sig Dispense Refill  . predniSONE (DELTASONE) 1 MG tablet Take 4 tablets (4 mg total) by mouth daily with breakfast.    . albuterol (VENTOLIN HFA) 108 (90 Base) MCG/ACT inhaler Inhale 2 puffs into the lungs every 4 (four) hours as needed for wheezing or shortness of breath. 18 g 5  . atorvastatin (LIPITOR) 10 MG tablet TAKE 1 TABLET BY MOUTH EVERY DAY (Patient taking differently: Take 10 mg by mouth daily.) 90 tablet 3  . famotidine (PEPCID) 20 MG tablet Take 1 tablet (20 mg total) by mouth 2 (two) times daily. 60 tablet 0  . flecainide (TAMBOCOR) 50 MG tablet TAKE 1 TABLET BY MOUTH TWICE A DAY 180 tablet 2  . latanoprost (XALATAN) 0.005 % ophthalmic solution Place 1 drop into both eyes every evening.    Marland Kitchen levothyroxine (SYNTHROID) 100 MCG tablet TAKE 1 TABLET BY MOUTH EVERY DAY (Patient taking differently: Take 100 mcg by mouth daily before breakfast.) 90 tablet 3  . lidocaine-prilocaine (EMLA) cream Apply to affected area once 30 g 3  . metoprolol succinate (TOPROL-XL) 50 MG 24 hr tablet Take 1 tablet (50  mg total) by mouth daily. Take with or immediately following a meal. 270 tablet 0  . multivitamin-iron-minerals-folic acid (CENTRUM) chewable tablet Chew 1 tablet by mouth daily.    Marland Kitchen omeprazole (PRILOSEC) 40 MG capsule TAKE 1 CAPSULE (40 MG TOTAL) BY MOUTH DAILY AS NEEDED (REFLUX). 90 capsule 3  . ondansetron (ZOFRAN) 8 MG tablet Take 1 tablet (8 mg total) by mouth 2 (two) times daily as  needed for refractory nausea / vomiting. Start on day 3 after chemo. 30 tablet 1  . prochlorperazine (COMPAZINE) 10 MG tablet Take 1 tablet (10 mg total) by mouth every 6 (six) hours as needed (Nausea or vomiting). 30 tablet 1  . Rivaroxaban (XARELTO) 15 MG TABS tablet TAKE 1 TABLET (15 MG TOTAL) BY MOUTH DAILY WITH SUPPER. (Patient taking differently: Take 15 mg by mouth daily with supper. TAKE 1 TABLET (15 MG TOTAL) BY MOUTH DAILY WITH SUPPER.) 90 tablet 1  . timolol (BETIMOL) 0.5 % ophthalmic solution Place 1 drop into both eyes 2 (two) times daily.    Marland Kitchen triamcinolone cream (KENALOG) 0.1 % Apply 1 application topically 2 (two) times daily. 90 g 0  . triamterene-hydrochlorothiazide (DYAZIDE) 37.5-25 MG capsule Take 1 each (1 capsule total) by mouth daily. 90 capsule 3   No current facility-administered medications for this visit.    PHYSICAL EXAMINATION: ECOG PERFORMANCE STATUS: 1 - Symptomatic but completely ambulatory  Vitals:   03/30/21 0909  BP: 105/80  Pulse: 70  Resp: 17  Temp: 97.6 F (36.4 C)  SpO2: 96%   Filed Weights   03/30/21 0909  Weight: 158 lb (71.7 kg)    LABORATORY DATA:  I have reviewed the data as listed CMP Latest Ref Rng & Units 03/09/2021 02/16/2021 02/01/2021  Glucose 70 - 99 mg/dL 132(H) 90 160(H)  BUN 8 - 23 mg/dL 19 18 21   Creatinine 0.44 - 1.00 mg/dL 1.33(H) 1.17(H) 1.35(H)  Sodium 135 - 145 mmol/L 141 140 138  Potassium 3.5 - 5.1 mmol/L 3.3(L) 3.6 3.8  Chloride 98 - 111 mmol/L 105 104 97(L)  CO2 22 - 32 mmol/L 25 27 30   Calcium 8.9 - 10.3 mg/dL 8.8(L) 9.0 8.9  Total Protein 6.5 - 8.1 g/dL 5.8(L) 5.8(L) 6.1(L)  Total Bilirubin 0.3 - 1.2 mg/dL 1.1 1.2 1.8(H)  Alkaline Phos 38 - 126 U/L 55 50 54  AST 15 - 41 U/L 19 17 16   ALT 0 - 44 U/L 16 16 16     Lab Results  Component Value Date   WBC 6.7 03/30/2021   HGB 10.9 (L) 03/30/2021   HCT 33.4 (L) 03/30/2021   MCV 94.6 03/30/2021   PLT 63 (L) 03/30/2021   NEUTROABS PENDING 03/30/2021     ASSESSMENT & PLAN:  Malignant neoplasm of upper-outer quadrant of left breast in female, estrogen receptor positive ([place]) 01/02/2021: Screening mammogram detected left breast mass at 12 o'clock position 2.5 cm spiculated mass. Abnormal left axillary lymph node 1.3 cm. 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, PR 0%, HER-2 equivocal by IHC, FISH negative, Ki-67 20% T2N1 stage IIIa  MammaPrint high risk  Treatment plan: 1.Neoadjuvant chemotherapy with 4 cycles of Taxotere and Cytoxan 2.followed by breast conserving surgery with targeted lymph node surgery 3.Adjuvant radiation 4.Follow-up adjuvant antiestrogen therapy with anastrozole 1 mg daily x7 years ------------------------------------------------------------------------------------------------------------ Current Treatment: Cycle4Taxotere and Cytoxan (we decided to discontinue chemo) Chemo Toxicities: 1.Severe nausea: She does not like taking antinausea medications.  2.fatigue as result of diarrhea 3.Leukopenia: We  reduced the dosage of cycle 2 of Taxotere and Cytoxan. 4.    Hives: After cycle 3 on day 10: Went to urgent care and they gave her an injection and then she used Benadryl which helped resolve the hives. 5.  Nutrition: I encouraged her to eat more protein in her diet.  We discussed different options. 6.  Thrombocytopenia: Platelet count is only 63 today.  After discussing the risks and benefits of giving the fourth cycle of chemo we decided that it was in her best interest to discontinue further chemotherapy at this time.  Her port can be removed. RTC after surgery to discuss final pathology report.    No orders of the defined types were placed in this encounter.  The patient has a good understanding of the overall plan. she agrees with it. she will call with any problems that may develop before the next visit here.  Total  time spent: 30 mins including face to face time and time spent for planning, charting and coordination of care  Rulon Eisenmenger, MD, MPH 03/30/2021  I, Cloyde Reams Dorshimer, am acting as scribe for Dr. [name].  I have reviewed the above documentation for accuracy and completeness, and I agree with the above.

## 2021-03-29 NOTE — Assessment & Plan Note (Signed)
01/02/2021: Screening mammogram detected left breast mass at 12 o'clock position 2.5 cm spiculated mass. Abnormal left axillary lymph node 1.3 cm. 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 2.fatigue as result of diarrhea 3.Leukopenia: We will reduce the dosage of cycle 2 of Taxotere and Cytoxan. 4.  Nausea issues: I instructed her to take Zofran and Compazine at the earliest sign of nausea and not to wait for later. 5.  Nutrition: I encouraged her to eat more protein in her diet.  We discussed different options.  RTC after XRT is complete

## 2021-03-30 ENCOUNTER — Inpatient Hospital Stay: Payer: Medicare HMO | Admitting: Hematology and Oncology

## 2021-03-30 ENCOUNTER — Inpatient Hospital Stay: Payer: Medicare HMO

## 2021-03-30 ENCOUNTER — Encounter: Payer: Self-pay | Admitting: *Deleted

## 2021-03-30 ENCOUNTER — Other Ambulatory Visit: Payer: Self-pay

## 2021-03-30 DIAGNOSIS — Z170 Estrogen receptor positive status [ER+]: Secondary | ICD-10-CM | POA: Diagnosis not present

## 2021-03-30 DIAGNOSIS — Z95828 Presence of other vascular implants and grafts: Secondary | ICD-10-CM

## 2021-03-30 DIAGNOSIS — Z5111 Encounter for antineoplastic chemotherapy: Secondary | ICD-10-CM | POA: Diagnosis not present

## 2021-03-30 DIAGNOSIS — C50412 Malignant neoplasm of upper-outer quadrant of left female breast: Secondary | ICD-10-CM

## 2021-03-30 LAB — CBC WITH DIFFERENTIAL/PLATELET
Abs Immature Granulocytes: 0.02 10*3/uL (ref 0.00–0.07)
Basophils Absolute: 0 10*3/uL (ref 0.0–0.1)
Basophils Relative: 0 %
Eosinophils Absolute: 0 10*3/uL (ref 0.0–0.5)
Eosinophils Relative: 0 %
HCT: 33.4 % — ABNORMAL LOW (ref 36.0–46.0)
Hemoglobin: 10.9 g/dL — ABNORMAL LOW (ref 12.0–15.0)
Immature Granulocytes: 0 %
Lymphocytes Relative: 19 %
Lymphs Abs: 1.3 10*3/uL (ref 0.7–4.0)
MCH: 30.9 pg (ref 26.0–34.0)
MCHC: 32.6 g/dL (ref 30.0–36.0)
MCV: 94.6 fL (ref 80.0–100.0)
Monocytes Absolute: 1.5 10*3/uL — ABNORMAL HIGH (ref 0.1–1.0)
Monocytes Relative: 22 %
Neutro Abs: 3.9 10*3/uL (ref 1.7–7.7)
Neutrophils Relative %: 59 %
Platelets: 63 10*3/uL — ABNORMAL LOW (ref 150–400)
RBC: 3.53 MIL/uL — ABNORMAL LOW (ref 3.87–5.11)
RDW: 16.2 % — ABNORMAL HIGH (ref 11.5–15.5)
WBC: 6.7 10*3/uL (ref 4.0–10.5)
nRBC: 0 % (ref 0.0–0.2)

## 2021-03-30 LAB — COMPREHENSIVE METABOLIC PANEL
ALT: 13 U/L (ref 0–44)
AST: 17 U/L (ref 15–41)
Albumin: 2.9 g/dL — ABNORMAL LOW (ref 3.5–5.0)
Alkaline Phosphatase: 53 U/L (ref 38–126)
Anion gap: 11 (ref 5–15)
BUN: 16 mg/dL (ref 8–23)
CO2: 24 mmol/L (ref 22–32)
Calcium: 8.7 mg/dL — ABNORMAL LOW (ref 8.9–10.3)
Chloride: 105 mmol/L (ref 98–111)
Creatinine, Ser: 1.33 mg/dL — ABNORMAL HIGH (ref 0.44–1.00)
GFR, Estimated: 41 mL/min — ABNORMAL LOW (ref >60)
Glucose, Bld: 125 mg/dL — ABNORMAL HIGH (ref 70–99)
Potassium: 3.3 mmol/L — ABNORMAL LOW (ref 3.5–5.1)
Sodium: 140 mmol/L (ref 135–145)
Total Bilirubin: 1.2 mg/dL (ref 0.3–1.2)
Total Protein: 5.8 g/dL — ABNORMAL LOW (ref 6.5–8.1)

## 2021-03-30 MED ORDER — SODIUM CHLORIDE 0.9% FLUSH
10.0000 mL | Freq: Once | INTRAVENOUS | Status: AC
  Administered 2021-03-30: 10 mL
  Filled 2021-03-30: qty 10

## 2021-03-30 MED ORDER — PREDNISONE 1 MG PO TABS: 4.0000 mg | ORAL_TABLET | Freq: Every day | ORAL | Status: AC

## 2021-04-02 ENCOUNTER — Other Ambulatory Visit: Payer: Self-pay | Admitting: Physician Assistant

## 2021-04-02 ENCOUNTER — Ambulatory Visit (HOSPITAL_COMMUNITY): Payer: Medicare HMO

## 2021-04-02 DIAGNOSIS — M79604 Pain in right leg: Secondary | ICD-10-CM

## 2021-04-04 LAB — HM MAMMOGRAPHY: HM Mammogram: ABNORMAL — AB (ref 0–4)

## 2021-04-10 ENCOUNTER — Encounter: Payer: Self-pay | Admitting: Family Medicine

## 2021-04-15 ENCOUNTER — Other Ambulatory Visit: Payer: Self-pay | Admitting: Family Medicine

## 2021-04-15 ENCOUNTER — Other Ambulatory Visit: Payer: Self-pay | Admitting: Hematology and Oncology

## 2021-04-15 DIAGNOSIS — C50412 Malignant neoplasm of upper-outer quadrant of left female breast: Secondary | ICD-10-CM

## 2021-04-16 ENCOUNTER — Other Ambulatory Visit: Payer: Self-pay | Admitting: Cardiovascular Disease

## 2021-04-17 ENCOUNTER — Encounter: Payer: Self-pay | Admitting: Hematology and Oncology

## 2021-04-18 ENCOUNTER — Other Ambulatory Visit: Payer: Self-pay | Admitting: Family Medicine

## 2021-04-19 ENCOUNTER — Encounter: Payer: Self-pay | Admitting: *Deleted

## 2021-04-19 ENCOUNTER — Other Ambulatory Visit: Payer: Self-pay | Admitting: Cardiovascular Disease

## 2021-04-26 ENCOUNTER — Other Ambulatory Visit: Payer: Self-pay | Admitting: General Surgery

## 2021-04-26 DIAGNOSIS — C50412 Malignant neoplasm of upper-outer quadrant of left female breast: Secondary | ICD-10-CM

## 2021-04-26 DIAGNOSIS — Z170 Estrogen receptor positive status [ER+]: Secondary | ICD-10-CM

## 2021-04-27 ENCOUNTER — Telehealth: Payer: Self-pay | Admitting: Hematology and Oncology

## 2021-04-27 ENCOUNTER — Encounter: Payer: Self-pay | Admitting: *Deleted

## 2021-04-27 DIAGNOSIS — C50412 Malignant neoplasm of upper-outer quadrant of left female breast: Secondary | ICD-10-CM

## 2021-04-27 NOTE — Telephone Encounter (Signed)
Scheduled appointment per 06/23 sch msg. Left message.

## 2021-05-04 ENCOUNTER — Inpatient Hospital Stay
Admission: RE | Admit: 2021-05-04 | Discharge: 2021-05-04 | Disposition: A | Discharge status: Home / Self Care | Payer: Self-pay | Source: Ambulatory Visit | Attending: Radiation Oncology | Admitting: Radiation Oncology

## 2021-05-04 ENCOUNTER — Other Ambulatory Visit: Payer: Self-pay | Admitting: Radiation Oncology

## 2021-05-04 DIAGNOSIS — C50412 Malignant neoplasm of upper-outer quadrant of left female breast: Secondary | ICD-10-CM

## 2021-05-04 DIAGNOSIS — Z170 Estrogen receptor positive status [ER+]: Secondary | ICD-10-CM

## 2021-05-10 NOTE — Progress Notes (Signed)
Surgical Instructions    Your procedure is scheduled on Thursday, July 14th, 2022  Report to [facility] Main Entrance "A" at 07:30 A.M., then check in with the Admitting office.  Call this number if you have problems the morning of surgery:  [phone]   If you have any questions prior to your surgery date call [phone]: Open Monday-Friday 8am-4pm    Remember:  Do not eat after midnight the night before your surgery  You may drink clear liquids until 06:30 the morning of your surgery.   Clear liquids allowed are: Water, Non-Citrus Juices (without pulp), Carbonated Beverages, Clear Tea, Black Coffee Only, and Gatorade  Patient Instructions  The night before surgery:  No food after midnight. ONLY clear liquids after midnight  The day of surgery (if you do NOT have diabetes):  Drink ONE (1) Pre-Surgery Clear Ensure by 06:30 the morning of surgery. Drink in one sitting. Do not sip.  This drink was given to you during your hospital  pre-op appointment visit.  Nothing else to drink after completing the  Pre-Surgery Clear Ensure.         If you have questions, please contact your surgeon's office.     Take these medicines the morning of surgery with A SIP OF WATER:  flecainide (TAMBOCOR) metoprolol succinate (TOPROL-XL)  omeprazole (PRILOSEC) predniSONE (DELTASONE)  If needed:  albuterol (VENTOLIN HFA) - please, bring the inhaler with you  Follow your surgeon's instructions on when to stop Xarelto. If no instructions were given by your surgeon then you will need to call the office to get those instructions.     As of today, STOP taking any Aspirin (unless otherwise instructed by your surgeon) Aleve, Naproxen, Ibuprofen, Motrin, Advil, Goody's, BC's, all herbal medications, fish oil, and all vitamins.                     Do NOT Smoke (Tobacco/Vaping) or drink Alcohol 24 hours prior to your procedure.  If you use a CPAP at night, you may bring all equipment for your  overnight stay.   Contacts, glasses, piercing's, hearing aid's, dentures or partials may not be worn into surgery, please bring cases for these belongings.    For patients admitted to the hospital, discharge time will be determined by your treatment team.   Patients discharged the day of surgery will not be allowed to drive home, and someone needs to stay with them for 24 hours.  ONLY 1 SUPPORT PERSON MAY BE PRESENT WHILE YOU ARE IN SURGERY. IF YOU ARE TO BE ADMITTED ONCE YOU ARE IN YOUR ROOM YOU WILL BE ALLOWED TWO (2) VISITORS.  Minor children may have two parents present. Special consideration for safety and communication needs will be reviewed on a case by case basis.   Special instructions:   [place]- Preparing For Surgery  Before surgery, you can play an important role. Because skin is not sterile, your skin needs to be as free of germs as possible. You can reduce the number of germs on your skin by washing with CHG (chlorahexidine gluconate) Soap before surgery.  CHG is an antiseptic cleaner which kills germs and bonds with the skin to continue killing germs even after washing.    Oral Hygiene is also important to reduce your risk of infection.  Remember - BRUSH YOUR TEETH THE MORNING OF SURGERY WITH YOUR REGULAR TOOTHPASTE  Please do not use if you have an allergy to CHG or antibacterial soaps. If your skin  becomes reddened/irritated stop using the CHG.  Do not shave (including legs and underarms) for at least 48 hours prior to first CHG shower. It is OK to shave your face.  Please follow these instructions carefully.   Shower the NIGHT BEFORE SURGERY and the MORNING OF SURGERY  If you chose to wash your hair, wash your hair first as usual with your normal shampoo.  After you shampoo, rinse your hair and body thoroughly to remove the shampoo.  Use CHG Soap as you would any other liquid soap. You can apply CHG directly to the skin and wash gently with a scrungie or a clean  washcloth.   Apply the CHG Soap to your body ONLY FROM THE NECK DOWN.  Do not use on open wounds or open sores. Avoid contact with your eyes, ears, mouth and genitals (private parts). Wash Face and genitals (private parts)  with your normal soap.   Wash thoroughly, paying special attention to the area where your surgery will be performed.  Thoroughly rinse your body with warm water from the neck down.  DO NOT shower/wash with your normal soap after using and rinsing off the CHG Soap.  Pat yourself dry with a CLEAN TOWEL.  Wear CLEAN PAJAMAS to bed the night before surgery  Place CLEAN SHEETS on your bed the night before your surgery  DO NOT SLEEP WITH PETS.   Day of Surgery: Shower with CHG soap. Do not wear jewelry, make up, nail polish, gel polish, artificial nails, or any other type of covering on natural nails including finger and toenails. If patients have artificial nails, gel coating, etc. that need to be removed by a nail salon please have this removed prior to surgery. Surgery may need to be canceled/delayed if the surgeon/ anesthesia feels like the patient is unable to be adequately monitored. Do not wear lotions, powders, perfumes, or deodorant. Men may shave face and neck. Do not bring valuables to the hospital. [facility] is not responsible for any belongings or valuables. Wear Clean/Comfortable clothing the morning of surgery Remember to brush your teeth WITH YOUR REGULAR TOOTHPASTE.   Please read over the following fact sheets that you were given.

## 2021-05-11 ENCOUNTER — Encounter (HOSPITAL_COMMUNITY): Payer: Self-pay

## 2021-05-11 ENCOUNTER — Other Ambulatory Visit: Payer: Self-pay

## 2021-05-11 ENCOUNTER — Encounter (HOSPITAL_COMMUNITY)
Admission: RE | Admit: 2021-05-11 | Discharge: 2021-05-11 | Disposition: A | Discharge status: Home / Self Care | Payer: Medicare HMO | Source: Ambulatory Visit | Attending: General Surgery | Admitting: General Surgery

## 2021-05-11 ENCOUNTER — Telehealth: Payer: Self-pay | Admitting: Cardiovascular Disease

## 2021-05-11 DIAGNOSIS — Z01812 Encounter for preprocedural laboratory examination: Secondary | ICD-10-CM | POA: Insufficient documentation

## 2021-05-11 HISTORY — DX: Gastro-esophageal reflux disease without esophagitis: K21.9

## 2021-05-11 LAB — BASIC METABOLIC PANEL
Anion gap: 6 (ref 5–15)
BUN: 16 mg/dL (ref 8–23)
CO2: 26 mmol/L (ref 22–32)
Calcium: 9 mg/dL (ref 8.9–10.3)
Chloride: 105 mmol/L (ref 98–111)
Creatinine, Ser: 1.33 mg/dL — ABNORMAL HIGH (ref 0.44–1.00)
GFR, Estimated: 40 mL/min — ABNORMAL LOW (ref >60)
Glucose, Bld: 102 mg/dL — ABNORMAL HIGH (ref 70–99)
Potassium: 3.2 mmol/L — ABNORMAL LOW (ref 3.5–5.1)
Sodium: 137 mmol/L (ref 135–145)

## 2021-05-11 LAB — CBC
HCT: 36.5 % (ref 36.0–46.0)
Hemoglobin: 11.7 g/dL — ABNORMAL LOW (ref 12.0–15.0)
MCH: 32.1 pg (ref 26.0–34.0)
MCHC: 32.1 g/dL (ref 30.0–36.0)
MCV: 100 fL (ref 80.0–100.0)
Platelets: 83 10*3/uL — ABNORMAL LOW (ref 150–400)
RBC: 3.65 MIL/uL — ABNORMAL LOW (ref 3.87–5.11)
RDW: 13.6 % (ref 11.5–15.5)
WBC: 2.9 10*3/uL — ABNORMAL LOW (ref 4.0–10.5)
nRBC: 0 % (ref 0.0–0.2)

## 2021-05-11 NOTE — Telephone Encounter (Signed)
   Name: Christina Jacobs  DOB: 1941/09/30  MRN: [id number]   Primary Cardiologist: Jenkins Rouge, MD  Chart reviewed as part of pre-operative protocol for lumpectomy and port removal scheduled for 05/18/21.  Pt  has history of PAF on flecainide. She had sub-optimal ETT 01/31/2018 with poor exercise tolerance and recommendation for subsequent MPI or cCTA/FFR (not yet obtained). OV notes after this time reference ETT as nl.  She has an upcoming visit with Kathyrn Drown 05/23/21, but this will be after her 05/18/21 procedure.   Pt was contacted 05/11/2021 and denied CP. She reported stable DOE, stating she is unable to walk a block on level ground or go up a flight of steps without SOB, which she attributes to her hiatal hernia.   Since she is not able to complete greater than 4.0 METS without DOE, routing to Dr. Johnsie Cancel to see if he recommends additional testing or an office visit before her procedure at this time.  Dr. Johnsie Cancel:  Please route your reply to P CV DIV PREOP  Will also route to the pharmacy preop pool for recommendations regarding her anticoagulation.   Arvil Chaco, PA-C 05/11/2021, 3:18 PM

## 2021-05-11 NOTE — Progress Notes (Addendum)
PCP - Alysia Penna, MD Cardiologist - Jenkins Rouge, MD  PPM/ICD - denies Device Orders - N/A Rep Notified - N/A  Chest x-ray - N/A EKG - 01/23/2021 Stress Test - 01/31/2018 ECHO - 12/17/2017 Cardiac Cath - denies  Sleep Study - denies CPAP - N/A  Fasting Blood Sugar - N/A  Blood Thinner Instructions: Xarelto - patient was instructed to contact doctor for instructions  Aspirin Instructions: Patient was instructed: As of today, STOP taking any Aspirin (unless otherwise instructed by your surgeon) Aleve, Naproxen, Ibuprofen, Motrin, Advil, Goody's, BC's, all herbal medications, fish oil, and all vitamins.  ERAS Protcol - yes PRE-SURGERY Ensure - yes   COVID TEST- ambulatory surgery   Anesthesia review: yes; cardiac history; abnormal labs in PAT  Patient denies shortness of breath, fever, cough and chest pain at PAT appointment   All instructions explained to the patient, with a verbal understanding of the material. Patient agrees to go over the instructions while at home for a better understanding. Patient also instructed to self quarantine after being tested for COVID-19. The opportunity to ask questions was provided.

## 2021-05-11 NOTE — Telephone Encounter (Signed)
   [facility] Pre-operative Risk Assessment    Request for surgical clearance:  What type of surgery is being performed? Lumpectomy and port removal    When is this surgery scheduled? 7/14   What type of clearance is required (medical clearance vs. Pharmacy clearance to hold med vs. Both)? both  Are there any medications that need to be held prior to surgery and how long?Rivaroxaban (XARELTO) 15 MG TABS tablet hold for 2 two days before   Practice name and name of physician performing surgery? Dr. [name]   What is your office phone number [phone]    7.   What is your office fax number [phone]  8.   Anesthesia type (None, local, MAC, general) ? general   Milbert Coulter 05/11/2021, 3:02 PM  _________________________________________________________________   (provider comments below)

## 2021-05-12 ENCOUNTER — Encounter (HOSPITAL_COMMUNITY): Payer: Self-pay

## 2021-05-12 NOTE — Progress Notes (Addendum)
Anesthesia Chart Review:  Case: [id number] Date/Time: 05/18/21 0915   Procedures:      LEFT BREAST LUMPECTOMY WITH RADIOACTIVE SEED AND LEFT AXILLARY SENTINEL LYMPH NODE BIOPSY (Left: Breast) - 120 MINUTES ROOM 1     RADIOACTIVE SEED GUIDED LEFT AXILLARY NODE EXCISION (Left: Breast)     REMOVAL PORT-A-CATH (Breast)   Anesthesia type: General   Pre-op diagnosis: BREAST CANCER   Location: [place] OR ROOM 01 / [place] OR   Surgeons: [name], MD       DISCUSSION: Patient is an 80 year old female scheduled for the above procedure.  History includes never smoker, HTN, asthma, CKD (stage III), afib (failed DCCV 12/31/17; DCCV 02/11/18; on Xarelto), myasthenia gravis (diplopia, predominantly ocular seropositive myasthenia gravis, diagnosed 2013), glaucoma, hypothyroidism (partial thyroidectomy 1999), hiatal hernia (Giant type 4, s/p laparoscopic foregut dissection/gastric omentopexy, hiatal hernia too large to close and foregut cardia too stuck to mobilize 10/18/10; [facility] bariatric surgery evaluation 12/03/16 for giant type IV HH, defer surgical intervention unless worsening symptoms or impending obstruction/volvulus), GERD, asthma, breast cancer (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 S/p Port-a-cath 01/25/21.  Preoperative cardiology input outlined by Marrianne Mood, PA-C on 05/12/21: "Patient with PMH as below and history of Afib on Xarelto. Last EF 55-60%.  Christina Jacobs was last seen on 08/05/2020 by Dr. Johnsie Cancel.     I spoke with the patient 05/11/21. She was doing well without any CP. She reported unchanged DOE with chronic inability to complete greater than 4.0 METS as below. Dr. Johnsie Cancel was contacted and updated on the patient's activity level and has confirmed the pt may proceed with her procedure. The patient was advised that if she develops new symptoms prior to surgery to  contact our office to arrange for a follow-up visit, and she verbalized understanding. RCRI calculated risk is less than 04.%, placing the pt at very low risk of MACE. Based on ACC/AHA guidelines, the patient would be at acceptable risk for the planned procedure without further cardiovascular testing.    Pharmacy contacted with recommendation to hold Xarelto 2 days prior to lumpectomy and port removal."   She is on prednisone 1 mg 4 tablets Q AM for myasthenia gravis which is followed by Dr. Burnett Harry with [facility].   Preoperative labs showed stable Creatinine 1.33. K 3.2. WBC 2.9. H/H 11.7/36.5. PLT count up to 83K from 63K on 03/30/21 (last chemotherapy 03/11/21, decision not to continue chemo 03/30/21). I have routed CBC and BMET to Dr. Donne Hazel for review. (UPDATE 05/15/21 4:52 PM: No new recommendations following Dr. Cristal Generous review of PAT labs.)  [name] 05/17/21 at 8:15 AM. Nuclear Med 05/18/21 at 9:00 AM.   Anesthesia team to evaluate on the day of surgery.   VS: BP 127/64   Pulse 73   Temp 36.8 C   Resp 18   Ht 5\' 3"  (1.6 m)   Wt 69.3 kg   SpO2 97%   BMI 27.05 kg/m   PROVIDERS: Laurey Morale, MD is PCP  Jenkins Rouge, MD is cardiologist  Scheryl Marten, MD is neurologist [facility]) Corliss Parish, MD is nephrologist [name], MD is HEM-ONC Kyung Rudd, MD is RAD-ONC   LABS: Preoperative labs noted. See DISCUSSION. Will defer additional preoperative lab orders, if any, to surgeon since PLT count is increasing and chemotherapy has since been stopped.  (all labs ordered are listed, but only abnormal results are displayed)  Labs  Reviewed  BASIC METABOLIC PANEL - Abnormal; Notable for the following components:      Result Value   Potassium 3.2 (*)    Glucose, Bld 102 (*)    Creatinine, Ser 1.33 (*)    GFR, Estimated 40 (*)    All other components within normal limits  CBC - Abnormal; Notable for the following components:   WBC 2.9 (*)    RBC 3.65 (*)     Hemoglobin 11.7 (*)    Platelets 83 (*)    All other components within normal limits    PFTs 11/07/16: FVC 1.98 (64%), post BD 1.75 (57%). FEV1 1.58 (69%), post BD 1.48 (64%). DLCO unc 12.62 (46%).    EKG: 01/23/2021 Normal sinus rhythm Nonspecific ST abnormality Abnormal ECG No significant change since last tracing Confirmed by Quay Burow [id number]) on 01/23/2021 8:24:02 PM    CV: Cardiac event monitor 04/28/18-05/27/18: Study Highlights NSR/Sinus bradycardia 99% time Less than 1% PAF Average HR 56 bpm No long pauses Symptoms of palpitations did not always correlate with PAF   Nuclear stress test 01/31/18: Blood pressure demonstrated a normal response to exercise. Downsloping ST segment depression ST segment depression was noted during stress in the V3 and V4 leads, beginning at 5 minutes of stress, and returning to baseline after 1-5 minutes of recovery. Poor study quality because of underlying atrial fibrillation and baseline ST - T wave abnormalities. There are downsloaping ST depressions that start at the beginning of recovery and resolve within 3 minutes. Poor exercise tolerance. - Dr. Johnsie Cancel reviewed. Consider nuclear stress test once back in NSR; 04/02/18 "ETT done 01/31/18 reviewed and normal with no pro arrhythmia or QRS widening"   Echo 12/17/17: Study Conclusions  - Left ventricle: The cavity size was normal. There was mild focal    basal hypertrophy of the septum. Systolic function was normal.    The estimated ejection fraction was in the range of 55% to 60%.    The study was not technically sufficient to allow evaluation of    LV diastolic dysfunction due to atrial fibrillation.  - Pulmonary arteries: PA peak pressure: 33 mm Hg (S).   Past Medical History:  Diagnosis Date   Adenomatous colon polyp    Anal fissure    Anxiety    Asthma    Dr. Halford Chessman   Atrial fibrillation [facility])    Bowel obstruction (HCC)    Bursitis    CKD (chronic kidney disease) stage 3, GFR  30-59 ml/min (HCC)    sees Dr. [name]    Complication of anesthesia    post op N/V   Diverticulosis    Dyspnea    After a meal   Dysrhythmia    Afib   GERD (gastroesophageal reflux disease)    Glaucoma    Hiatal hernia    sees Dr. [name] at [facility]    Hyperlipidemia    Hypertension    Hypothyroidism    Myasthenia gravis ([place]) 2013   followed at [facility] Dr Scheryl Marten, diplopia only   Osteoarthritis    see's Dr. [name]   Osteoporosis    last DEXA 08-03-11    Past Surgical History:  Procedure Laterality Date   ABDOMINAL HYSTERECTOMY     APPENDECTOMY     BREAST LUMPECTOMY Bilateral     several, enign breast lumps removed   CARDIOVERSION N/A 12/31/2017   Procedure: CARDIOVERSION;  Surgeon: Josue Hector, MD;  Location: [place];  Service: Cardiovascular;  Laterality: N/A;  CARDIOVERSION N/A 02/11/2018   Procedure: CARDIOVERSION;  Surgeon: Josue Hector, MD;  [facility] ENDOSCOPY;  Service: Cardiovascular;  Laterality: N/A;   COLONOSCOPY  09/27/2010   per Dr. Sharlett Iles, benign polyp, no repeats needed    EYE SURGERY     HERNIA REPAIR  10/18/2010   Dr. Hassell Done attempted but could reduce this, the entire stomach is above the diaphragm   PORTACATH PLACEMENT Right 01/25/2021   Procedure: INSERTION PORT-A-CATH;  Surgeon: [name], MD;  Location: [facility] ORS;  Service: General;  Laterality: Right;  START TIME OF 8:30 AM FOR 60 MINUTES ROOM 4 [place] [facility]   THYROIDECTOMY, PARTIAL  1999   TONSILLECTOMY      MEDICATIONS:  albuterol (VENTOLIN HFA) 108 (90 Base) MCG/ACT inhaler   atorvastatin (LIPITOR) 10 MG tablet   famotidine (PEPCID) 20 MG tablet   flecainide (TAMBOCOR) 50 MG tablet   latanoprost (XALATAN) 0.005 % ophthalmic solution   levothyroxine (SYNTHROID) 100 MCG tablet   lidocaine-prilocaine (EMLA) cream   metoprolol succinate (TOPROL-XL) 50 MG 24 hr tablet   Multiple Vitamin (MULTIVITAMIN WITH MINERALS) TABS tablet   omeprazole  (PRILOSEC) 40 MG capsule   ondansetron (ZOFRAN) 8 MG tablet   predniSONE (DELTASONE) 1 MG tablet   prochlorperazine (COMPAZINE) 10 MG tablet   Rivaroxaban (XARELTO) 15 MG TABS tablet   timolol (TIMOPTIC) 0.5 % ophthalmic solution   triamcinolone cream (KENALOG) 0.1 %   triamterene-hydrochlorothiazide (DYAZIDE) 37.5-25 MG capsule   No current facility-administered medications for this encounter.   Patient listed as not currently taking Pepcid, EMLA cream, Zofran, Compazine.    Christina Gianotti, PA-C Surgical Short Stay/Anesthesiology [facility] Phone [phone] [facility] Phone [phone] 05/12/2021 4:35 PM

## 2021-05-12 NOTE — Telephone Encounter (Signed)
Patient with diagnosis of afib on Xarelto for anticoagulation.    Procedure: Lumpectomy and port removal   Date of procedure: 05/18/21  CHA2DS2-VASc Score = 4  This indicates a 4.8% annual risk of stroke. The patient's score is based upon: CHF History: No HTN History: Yes Diabetes History: No Stroke History: No Vascular Disease History: No Age Score: 2 Gender Score: 1     CrCl 31.5 ml/min Platelet count 83  Per office protocol, patient can hold Xarelto for 2 days prior to procedure.

## 2021-05-12 NOTE — Telephone Encounter (Signed)
   Name: Christina Jacobs  DOB: 02-Jul-1941  MRN: [id number]   Primary Cardiologist: Jenkins Rouge, MD  Chart reviewed as part of pre-operative protocol coverage. Patient with PMH as below and history of Afib on Xarelto. Last EF 55-60%.  [name] was last seen on 08/05/2020 by Dr. Johnsie Cancel.    I spoke with the patient 05/11/21. She was doing well without any CP. She reported unchanged DOE with chronic inability to complete greater than 4.0 METS as below. Dr. Johnsie Cancel was contacted and updated on the patient's activity level and has confirmed the pt may proceed with her procedure. The patient was advised that if she develops new symptoms prior to surgery to contact our office to arrange for a follow-up visit, and she verbalized understanding. RCRI calculated risk is less than 04.%, placing the pt at very low risk of MACE. Based on ACC/AHA guidelines, the patient would be at acceptable risk for the planned procedure without further cardiovascular testing.   Pharmacy contacted with recommendation to hold Xarelto 2 days prior to lumpectomy and port removal.    I will route this recommendation to the requesting party via [facility] fax function and remove from pre-op pool. Please call with questions.  Arvil Chaco, PA-C 05/12/2021, 8:29 AM

## 2021-05-18 ENCOUNTER — Ambulatory Visit (HOSPITAL_COMMUNITY)
Admission: RE | Admit: 2021-05-18 | Discharge: 2021-05-18 | Disposition: A | Discharge status: Home / Self Care | Payer: Medicare HMO | Attending: General Surgery | Admitting: General Surgery

## 2021-05-18 ENCOUNTER — Encounter (HOSPITAL_COMMUNITY)
Admission: RE | Disposition: A | Discharge status: Home / Self Care | Payer: Self-pay | Source: Home / Self Care | Attending: General Surgery

## 2021-05-18 ENCOUNTER — Encounter (HOSPITAL_COMMUNITY): Payer: Self-pay | Admitting: General Surgery

## 2021-05-18 ENCOUNTER — Ambulatory Visit (HOSPITAL_COMMUNITY): Payer: Medicare HMO | Admitting: Anesthesiology

## 2021-05-18 ENCOUNTER — Ambulatory Visit (HOSPITAL_COMMUNITY): Payer: Medicare HMO | Admitting: Vascular Surgery

## 2021-05-18 ENCOUNTER — Ambulatory Visit (HOSPITAL_COMMUNITY)
Admission: RE | Admit: 2021-05-18 | Discharge: 2021-05-18 | Disposition: A | Discharge status: Home / Self Care | Payer: Medicare HMO | Source: Ambulatory Visit | Attending: General Surgery | Admitting: General Surgery

## 2021-05-18 DIAGNOSIS — Z888 Allergy status to other drugs, medicaments and biological substances status: Secondary | ICD-10-CM | POA: Insufficient documentation

## 2021-05-18 DIAGNOSIS — Z7952 Long term (current) use of systemic steroids: Secondary | ICD-10-CM | POA: Insufficient documentation

## 2021-05-18 DIAGNOSIS — Z7901 Long term (current) use of anticoagulants: Secondary | ICD-10-CM | POA: Diagnosis not present

## 2021-05-18 DIAGNOSIS — Z885 Allergy status to narcotic agent status: Secondary | ICD-10-CM | POA: Insufficient documentation

## 2021-05-18 DIAGNOSIS — C773 Secondary and unspecified malignant neoplasm of axilla and upper limb lymph nodes: Secondary | ICD-10-CM | POA: Insufficient documentation

## 2021-05-18 DIAGNOSIS — C50412 Malignant neoplasm of upper-outer quadrant of left female breast: Secondary | ICD-10-CM | POA: Insufficient documentation

## 2021-05-18 DIAGNOSIS — Z8616 Personal history of COVID-19: Secondary | ICD-10-CM | POA: Insufficient documentation

## 2021-05-18 DIAGNOSIS — I4891 Unspecified atrial fibrillation: Secondary | ICD-10-CM | POA: Insufficient documentation

## 2021-05-18 DIAGNOSIS — Z452 Encounter for adjustment and management of vascular access device: Secondary | ICD-10-CM | POA: Diagnosis not present

## 2021-05-18 DIAGNOSIS — Z79899 Other long term (current) drug therapy: Secondary | ICD-10-CM | POA: Insufficient documentation

## 2021-05-18 DIAGNOSIS — Z803 Family history of malignant neoplasm of breast: Secondary | ICD-10-CM | POA: Insufficient documentation

## 2021-05-18 DIAGNOSIS — Z91048 Other nonmedicinal substance allergy status: Secondary | ICD-10-CM | POA: Diagnosis not present

## 2021-05-18 DIAGNOSIS — Z79890 Hormone replacement therapy (postmenopausal): Secondary | ICD-10-CM | POA: Diagnosis not present

## 2021-05-18 DIAGNOSIS — Z170 Estrogen receptor positive status [ER+]: Secondary | ICD-10-CM | POA: Insufficient documentation

## 2021-05-18 HISTORY — PX: RADIOACTIVE SEED GUIDED AXILLARY SENTINEL LYMPH NODE: SHX6735

## 2021-05-18 HISTORY — PX: PORT-A-CATH REMOVAL: SHX5289

## 2021-05-18 HISTORY — PX: BREAST LUMPECTOMY WITH RADIOACTIVE SEED AND SENTINEL LYMPH NODE BIOPSY: SHX6550

## 2021-05-18 SURGERY — BREAST LUMPECTOMY WITH RADIOACTIVE SEED AND SENTINEL LYMPH NODE BIOPSY
Anesthesia: General | Site: Breast

## 2021-05-18 MED ORDER — SUCCINYLCHOLINE CHLORIDE 200 MG/10ML IV SOSY
PREFILLED_SYRINGE | INTRAVENOUS | Status: AC
  Filled 2021-05-18: qty 10

## 2021-05-18 MED ORDER — CEFAZOLIN SODIUM-DEXTROSE 2-4 GM/100ML-% IV SOLN
2.0000 g | INTRAVENOUS | Status: AC
  Administered 2021-05-18: 2 g via INTRAVENOUS

## 2021-05-18 MED ORDER — 0.9 % SODIUM CHLORIDE (POUR BTL) OPTIME
TOPICAL | Status: DC | PRN
  Administered 2021-05-18: 1000 mL

## 2021-05-18 MED ORDER — SODIUM CHLORIDE (PF) 0.9 % IJ SOLN
INTRAVENOUS | Status: DC | PRN
  Administered 2021-05-18: 4 mL via INTRAMUSCULAR

## 2021-05-18 MED ORDER — BUPIVACAINE-EPINEPHRINE (PF) 0.5% -1:200000 IJ SOLN
INTRAMUSCULAR | Status: DC | PRN
  Administered 2021-05-18: 30 mL via PERINEURAL

## 2021-05-18 MED ORDER — ONDANSETRON HCL 4 MG/2ML IJ SOLN
INTRAMUSCULAR | Status: DC | PRN
  Administered 2021-05-18: 4 mg via INTRAVENOUS

## 2021-05-18 MED ORDER — PROPOFOL 500 MG/50ML IV EMUL
INTRAVENOUS | Status: DC | PRN
  Administered 2021-05-18: 100 ug/kg/min via INTRAVENOUS

## 2021-05-18 MED ORDER — ACETAMINOPHEN 500 MG PO TABS
ORAL_TABLET | ORAL | Status: AC
  Administered 2021-05-18: 1000 mg via ORAL
  Filled 2021-05-18: qty 2

## 2021-05-18 MED ORDER — TRAMADOL HCL 50 MG PO TABS: 100.0000 mg | ORAL_TABLET | Freq: Four times a day (QID) | ORAL | 0 refills | Status: DC | PRN

## 2021-05-18 MED ORDER — CEFAZOLIN SODIUM-DEXTROSE 2-4 GM/100ML-% IV SOLN
INTRAVENOUS | Status: AC
  Filled 2021-05-18: qty 100

## 2021-05-18 MED ORDER — PROPOFOL 10 MG/ML IV BOLUS
INTRAVENOUS | Status: AC
  Filled 2021-05-18: qty 20

## 2021-05-18 MED ORDER — ROCURONIUM BROMIDE 10 MG/ML (PF) SYRINGE
PREFILLED_SYRINGE | INTRAVENOUS | Status: AC
  Filled 2021-05-18: qty 10

## 2021-05-18 MED ORDER — ONDANSETRON HCL 4 MG/2ML IJ SOLN: 4.0000 mg | Freq: Once | INTRAMUSCULAR | Status: DC | PRN

## 2021-05-18 MED ORDER — BUPIVACAINE-EPINEPHRINE 0.5% -1:200000 IJ SOLN
INTRAMUSCULAR | Status: AC
  Filled 2021-05-18: qty 1

## 2021-05-18 MED ORDER — CHLORHEXIDINE GLUCONATE 0.12 % MT SOLN: 15.0000 mL | Freq: Once | OROMUCOSAL | Status: AC

## 2021-05-18 MED ORDER — DEXAMETHASONE SODIUM PHOSPHATE 10 MG/ML IJ SOLN: INTRAMUSCULAR | Status: DC | PRN

## 2021-05-18 MED ORDER — FENTANYL CITRATE (PF) 100 MCG/2ML IJ SOLN
INTRAMUSCULAR | Status: AC
  Filled 2021-05-18: qty 2

## 2021-05-18 MED ORDER — LIDOCAINE 2% (20 MG/ML) 5 ML SYRINGE
INTRAMUSCULAR | Status: AC
  Filled 2021-05-18: qty 5

## 2021-05-18 MED ORDER — PHENYLEPHRINE HCL-NACL 10-0.9 MG/250ML-% IV SOLN
INTRAVENOUS | Status: DC | PRN
  Administered 2021-05-18: 30 ug/min via INTRAVENOUS

## 2021-05-18 MED ORDER — BUPIVACAINE HCL 0.25 % IJ SOLN
INTRAMUSCULAR | Status: DC | PRN
  Administered 2021-05-18: 1 mL

## 2021-05-18 MED ORDER — MIDAZOLAM HCL 2 MG/2ML IJ SOLN
INTRAMUSCULAR | Status: AC
  Filled 2021-05-18: qty 2

## 2021-05-18 MED ORDER — FENTANYL CITRATE (PF) 100 MCG/2ML IJ SOLN
25.0000 ug | INTRAMUSCULAR | Status: DC | PRN
  Administered 2021-05-18 (×2): 25 ug via INTRAVENOUS
  Administered 2021-05-18: 50 ug via INTRAVENOUS

## 2021-05-18 MED ORDER — LIDOCAINE HCL (CARDIAC) PF 100 MG/5ML IV SOSY
PREFILLED_SYRINGE | INTRAVENOUS | Status: DC | PRN
  Administered 2021-05-18: 60 mg via INTRAVENOUS

## 2021-05-18 MED ORDER — ENSURE PRE-SURGERY PO LIQD: 296.0000 mL | Freq: Once | ORAL | Status: DC

## 2021-05-18 MED ORDER — BUPIVACAINE HCL (PF) 0.25 % IJ SOLN: INTRAMUSCULAR | Status: DC | PRN

## 2021-05-18 MED ORDER — ACETAMINOPHEN 500 MG PO TABS: 1000.0000 mg | ORAL_TABLET | ORAL | Status: AC

## 2021-05-18 MED ORDER — FENTANYL CITRATE (PF) 100 MCG/2ML IJ SOLN
INTRAMUSCULAR | Status: DC | PRN
  Administered 2021-05-18 (×2): 50 ug via INTRAVENOUS

## 2021-05-18 MED ORDER — FENTANYL CITRATE (PF) 100 MCG/2ML IJ SOLN
INTRAMUSCULAR | Status: AC
  Administered 2021-05-18: 50 ug via INTRAVENOUS
  Filled 2021-05-18: qty 2

## 2021-05-18 MED ORDER — ONDANSETRON HCL 4 MG/2ML IJ SOLN
INTRAMUSCULAR | Status: AC
  Filled 2021-05-18: qty 2

## 2021-05-18 MED ORDER — DEXAMETHASONE SODIUM PHOSPHATE 10 MG/ML IJ SOLN
INTRAMUSCULAR | Status: DC | PRN
  Administered 2021-05-18: 10 mg

## 2021-05-18 MED ORDER — TECHNETIUM TC 99M TILMANOCEPT KIT
1.0000 | PACK | Freq: Once | INTRAVENOUS | Status: AC | PRN
  Administered 2021-05-18: 1 via INTRADERMAL

## 2021-05-18 MED ORDER — FENTANYL CITRATE (PF) 100 MCG/2ML IJ SOLN: 50.0000 ug | Freq: Once | INTRAMUSCULAR | Status: AC

## 2021-05-18 MED ORDER — HEMOSTATIC AGENTS (NO CHARGE) OPTIME
TOPICAL | Status: DC | PRN
  Administered 2021-05-18: 1 via TOPICAL

## 2021-05-18 MED ORDER — METHYLENE BLUE 0.5 % INJ SOLN
INTRAVENOUS | Status: AC
  Filled 2021-05-18: qty 10

## 2021-05-18 MED ORDER — CHLORHEXIDINE GLUCONATE 0.12 % MT SOLN
OROMUCOSAL | Status: AC
  Administered 2021-05-18: 15 mL via OROMUCOSAL
  Filled 2021-05-18: qty 15

## 2021-05-18 MED ORDER — FENTANYL CITRATE (PF) 250 MCG/5ML IJ SOLN
INTRAMUSCULAR | Status: AC
  Filled 2021-05-18: qty 5

## 2021-05-18 MED ORDER — DEXAMETHASONE SODIUM PHOSPHATE 10 MG/ML IJ SOLN
INTRAMUSCULAR | Status: AC
  Filled 2021-05-18: qty 1

## 2021-05-18 MED ORDER — BUPIVACAINE HCL (PF) 0.25 % IJ SOLN
INTRAMUSCULAR | Status: AC
  Filled 2021-05-18: qty 30

## 2021-05-18 MED ORDER — ORAL CARE MOUTH RINSE: 15.0000 mL | Freq: Once | OROMUCOSAL | Status: AC

## 2021-05-18 MED ORDER — PROPOFOL 10 MG/ML IV BOLUS
INTRAVENOUS | Status: DC | PRN
  Administered 2021-05-18: 130 mg via INTRAVENOUS
  Administered 2021-05-18: 50 mg via INTRAVENOUS

## 2021-05-18 MED ORDER — LACTATED RINGERS IV SOLN: INTRAVENOUS | Status: DC

## 2021-05-18 SURGICAL SUPPLY — 67 items
ADH SKN CLS APL DERMABOND .7 (GAUZE/BANDAGES/DRESSINGS) ×2
APL PRP STRL LF DISP 70% ISPRP (MISCELLANEOUS) ×2
APPLIER CLIP 9.375 MED OPEN (MISCELLANEOUS) ×4
APR CLP MED 9.3 20 MLT OPN (MISCELLANEOUS) ×2
BAG COUNTER SPONGE SURGICOUNT (BAG) ×3 IMPLANT
BAG SPNG CNTER NS LX DISP (BAG) ×2
BAG SURGICOUNT SPONGE COUNTING (BAG) ×1
BINDER BREAST LRG (GAUZE/BANDAGES/DRESSINGS) ×2 IMPLANT
BINDER BREAST XLRG (GAUZE/BANDAGES/DRESSINGS) IMPLANT
CANISTER SUCT 3000ML PPV (MISCELLANEOUS) ×4 IMPLANT
CHLORAPREP W/TINT 10.5 ML (MISCELLANEOUS) ×4 IMPLANT
CHLORAPREP W/TINT 26 (MISCELLANEOUS) ×4 IMPLANT
CLIP APPLIE 9.375 MED OPEN (MISCELLANEOUS) IMPLANT
CLIP VESOCCLUDE MED 6/CT (CLIP) ×4 IMPLANT
CLOSURE STERI-STRIP 1/2X4 (GAUZE/BANDAGES/DRESSINGS) ×1
CLOSURE WOUND 1/2 X4 (GAUZE/BANDAGES/DRESSINGS) ×1
CLSR STERI-STRIP ANTIMIC 1/2X4 (GAUZE/BANDAGES/DRESSINGS) ×1 IMPLANT
CNTNR URN SCR LID CUP LEK RST (MISCELLANEOUS) IMPLANT
CONT SPEC 4OZ STRL OR WHT (MISCELLANEOUS) ×8
COVER PROBE W GEL 5X96 (DRAPES) ×4 IMPLANT
COVER SURGICAL LIGHT HANDLE (MISCELLANEOUS) ×4 IMPLANT
DECANTER SPIKE VIAL GLASS SM (MISCELLANEOUS) ×4 IMPLANT
DERMABOND ADVANCED (GAUZE/BANDAGES/DRESSINGS) ×2
DERMABOND ADVANCED .7 DNX12 (GAUZE/BANDAGES/DRESSINGS) ×2 IMPLANT
DEVICE DUBIN SPECIMEN MAMMOGRA (MISCELLANEOUS) ×4 IMPLANT
DRAPE CHEST BREAST 15X10 FENES (DRAPES) ×4 IMPLANT
DRAPE LAPAROTOMY 100X72 PEDS (DRAPES) ×4 IMPLANT
ELECT CAUTERY BLADE 6.4 (BLADE) ×4 IMPLANT
ELECT COATED BLADE 2.86 ST (ELECTRODE) ×4 IMPLANT
ELECT REM PT RETURN 9FT ADLT (ELECTROSURGICAL) ×4
ELECTRODE REM PT RTRN 9FT ADLT (ELECTROSURGICAL) ×2 IMPLANT
GAUZE 4X4 16PLY ~~LOC~~+RFID DBL (SPONGE) ×4 IMPLANT
GAUZE SPONGE 4X4 12PLY STRL (GAUZE/BANDAGES/DRESSINGS) ×2 IMPLANT
GLOVE SURG ENC MOIS LTX SZ7 (GLOVE) ×4 IMPLANT
GLOVE SURG UNDER POLY LF SZ7.5 (GLOVE) ×4 IMPLANT
GOWN STRL REUS W/ TWL LRG LVL3 (GOWN DISPOSABLE) ×4 IMPLANT
GOWN STRL REUS W/TWL LRG LVL3 (GOWN DISPOSABLE) ×8
HEMOSTAT ARISTA ABSORB 3G PWDR (HEMOSTASIS) ×2 IMPLANT
ILLUMINATOR WAVEGUIDE N/F (MISCELLANEOUS) IMPLANT
KIT BASIN OR (CUSTOM PROCEDURE TRAY) ×4 IMPLANT
KIT MARKER MARGIN INK (KITS) ×4 IMPLANT
KIT TURNOVER KIT B (KITS) ×4 IMPLANT
NDL 18GX1X1/2 (RX/OR ONLY) (NEEDLE) IMPLANT
NDL FILTER BLUNT 18X1 1/2 (NEEDLE) IMPLANT
NDL HYPO 25GX1X1/2 BEV (NEEDLE) ×2 IMPLANT
NEEDLE 18GX1X1/2 (RX/OR ONLY) (NEEDLE) IMPLANT
NEEDLE FILTER BLUNT 18X 1/2SAF (NEEDLE) ×2
NEEDLE FILTER BLUNT 18X1 1/2 (NEEDLE) ×2 IMPLANT
NEEDLE HYPO 25GX1X1/2 BEV (NEEDLE) ×4 IMPLANT
NS IRRIG 1000ML POUR BTL (IV SOLUTION) ×4 IMPLANT
PACK GENERAL/GYN (CUSTOM PROCEDURE TRAY) ×4 IMPLANT
PAD ABD 8X10 STRL (GAUZE/BANDAGES/DRESSINGS) ×2 IMPLANT
PAD ARMBOARD 7.5X6 YLW CONV (MISCELLANEOUS) ×8 IMPLANT
PENCIL SMOKE EVACUATOR (MISCELLANEOUS) ×4 IMPLANT
SPONGE T-LAP 18X18 ~~LOC~~+RFID (SPONGE) ×4 IMPLANT
STRIP CLOSURE SKIN 1/2X4 (GAUZE/BANDAGES/DRESSINGS) ×3 IMPLANT
SUT MNCRL AB 4-0 PS2 18 (SUTURE) ×8 IMPLANT
SUT MON AB 5-0 PS2 18 (SUTURE) ×6 IMPLANT
SUT SILK 2 0 SH (SUTURE) ×2 IMPLANT
SUT VIC AB 2-0 CT1 18 (SUTURE) ×2 IMPLANT
SUT VIC AB 2-0 SH 27 (SUTURE) ×8
SUT VIC AB 2-0 SH 27XBRD (SUTURE) ×4 IMPLANT
SUT VIC AB 3-0 SH 27 (SUTURE) ×16
SUT VIC AB 3-0 SH 27X BRD (SUTURE) ×4 IMPLANT
SYR CONTROL 10ML LL (SYRINGE) ×4 IMPLANT
TOWEL GREEN STERILE (TOWEL DISPOSABLE) ×4 IMPLANT
TOWEL GREEN STERILE FF (TOWEL DISPOSABLE) ×4 IMPLANT

## 2021-05-18 NOTE — Op Note (Signed)
Preoperative diagnosis: Left breast cancer, node positive, status post primary chemotherapy Postoperative diagnosis: Same as above Procedure: 1.  Right IJ port removal 2.  Left breast radioactive seed guided lumpectomy 3.  Injection of methylene blue dye for sentinel lymph node identification 4.  Left deep axillary sentinel lymph node biopsy 5.  Left radioactive seed guided lymph node excision, axilla Surgical Dr. Serita Grammes Anesthesia: General with a pectoral block Specimens: 1.  Left breast lumpectomy marked with paint containing seed and clip 2.  Additional posterior margin marked short superior, long lateral, double deep 3.  Left axillary node containing radioactive seed, this was also a sentinel lymph node with a count of 323 4.  Additional left axillary sentinel lymph nodes with highest count of 680 Complications: None Drains: None Sponge and count was correct completion Disposition to recovery stable condition  Indication 22 yof who has fh in [facility] presented with palpable mass in left breast.  she was noted on mm to have a 2.5 cm mass and an enlarged node.  US shows a 2.4x2.6x1.1 cm mass. there is a 1.3 cm node.  biopsy of the node is positive for carcinoma. 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 she had high risk mammaprint and has undergone primary systemic therapy, last about a month ago due to issues. she was due to see me 6/1 then had covid (lab dx was actually 6/6).  she is still coughing but recovering slowly. she had repeat mm/us yesterday that shows some mild improvement with therapy.  Clinically her nodes were negative so we elected to proceed with a targeted node dissection as well as a lumpectomy, oncology said it was okay to remove her port as well.  Procedure: After informed consent was obtained she first underwent a pectoral block.  She was injected with Lymphoseek in the standard periareolar  fashion. She was given antibiotics and had SCDs were in place.  She was placed under general anesthesia without complication.  I had her mammograms with the seeds visualized in the operating room.  She was placed under general anesthesia without complication.  She was prepped and draped in the standard sterile surgical fashion.  A surgical timeout was then performed.  I infiltrated mixture of methylene blue dye and saline in the left upper outer quadrant as well as periareolar.  I massaged this.  This was done for later sentinel lymph node identification.  I first remove the port.  I infiltrated some Marcaine at the old incision.  I reentered her incision on her right chest.  I then remove the port and the line in their entirety.  All the suture material was removed.  Hemostasis was obtained.  I closed this with 3-0 Vicryl, 4-0 Monocryl, and glue.  I then did the lumpectomy.  I was able to identify the seed in the superior central breast.  Due to the size and the location I elected to make a curvilinear incision overlying the seed.  I develop flaps.  I then used the neoprobe to guide excision of the seed and the surrounding tissue then stem to get a clear margin.  This was then marked with paint.  Mammogram confirmed removal of the clip and the seed.  All of my margins appeared clear.  The posterior appeared close so I removed additional posterior margin and marked this as above.  The posterior margin is now the  muscle.  I then placed clips.  I closed this with 2-0 Vicryl, 3-0 Vicryl, and 4-0 Monocryl.  Glue was placed.  I then made an incision in the axilla below the hairline.  I identified the seed containing node.  This was also a sentinel node.  I excised this.  Mammogram confirmed removal of the clip and the seed.  I then removed what were several other sentinel lymph nodes and these were all sent together.  There was no background radioactivity and there was no other palpable adenopathy.  There really  is not much more tissue in her axilla.  I did visualize her vein while doing the procedure.  I then obtained hemostasis.  I did leave some Arista for some oozing.  I then closed this with 2-0 Vicryl, 3-0 Vicryl, and 4-0 Monocryl.  Glue was placed.  She tolerated the procedure well was extubated transferred to recovery stable.

## 2021-05-18 NOTE — Anesthesia Preprocedure Evaluation (Addendum)
Anesthesia Evaluation  Patient identified by MRN, date of birth, ID band Patient awake    Reviewed: Allergy & Precautions, NPO status , Patient's Chart, lab work & pertinent test results  Airway Mallampati: II  TM Distance: >3 FB Neck ROM: Full    Dental  (+) Teeth Intact, Dental Advisory Given   Pulmonary asthma ,    Pulmonary exam normal breath sounds clear to auscultation       Cardiovascular hypertension, Pt. on home beta blockers and Pt. on medications Normal cardiovascular exam+ dysrhythmias Atrial Fibrillation  Rhythm:Regular Rate:Normal     Neuro/Psych PSYCHIATRIC DISORDERS Anxiety  Neuromuscular disease    GI/Hepatic Neg liver ROS, hiatal hernia, GERD  ,  Endo/Other  Hypothyroidism   Renal/GU Renal InsufficiencyRenal disease     Musculoskeletal  (+) Arthritis ,   Abdominal   Peds  Hematology  (+) Blood dyscrasia (Xarelto;), anemia ,   Anesthesia Other Findings Day of surgery medications reviewed with the patient.  Breast cancer   Reproductive/Obstetrics                            Anesthesia Physical Anesthesia Plan  ASA: 3  Anesthesia Plan: General   Post-op Pain Management:    Induction: Intravenous  PONV Risk Score and Plan: 4 or greater and Dexamethasone and Ondansetron  Airway Management Planned: Oral ETT  Additional Equipment:   Intra-op Plan:   Post-operative Plan: Extubation in OR  Informed Consent: I have reviewed the patients History and Physical, chart, labs and discussed the procedure including the risks, benefits and alternatives for the proposed anesthesia with the patient or authorized representative who has indicated his/her understanding and acceptance.     Dental advisory given  Plan Discussed with: CRNA  Anesthesia Plan Comments:         Anesthesia Quick Evaluation

## 2021-05-18 NOTE — Discharge Instructions (Signed)
[facility] Office Phone Number [phone]  BREAST BIOPSY/ PARTIAL MASTECTOMY: POST OP INSTRUCTIONS Take 400 mg of ibuprofen every 8 hours or 650 mg tylenol every 6 hours for next 72 hours then as needed. Use ice several times daily also. Always review your discharge instruction sheet given to you by the facility where your surgery was performed.  IF YOU HAVE DISABILITY OR FAMILY LEAVE FORMS, YOU MUST BRING THEM TO THE OFFICE FOR PROCESSING.  DO NOT GIVE THEM TO YOUR DOCTOR.  A prescription for pain medication may be given to you upon discharge.  Take your pain medication as prescribed, if needed.  If narcotic pain medicine is not needed, then you may take acetaminophen (Tylenol), naprosyn (Alleve) or ibuprofen (Advil) as needed. Take your usually prescribed medications unless otherwise directed If you need a refill on your pain medication, please contact your pharmacy.  They will contact our office to request authorization.  Prescriptions will not be filled after 5pm or on week-ends. You should eat very light the first 24 hours after surgery, such as soup, crackers, pudding, etc.  Resume your normal diet the day after surgery. Most patients will experience some swelling and bruising in the breast.  Ice packs and a good support bra will help.  Wear the breast binder provided or a sports bra for 72 hours day and night.  After that wear a sports bra during the day until you return to the office. Swelling and bruising can take several days to resolve.  It is common to experience some constipation if taking pain medication after surgery.  Increasing fluid intake and taking a stool softener will usually help or prevent this problem from occurring.  A mild laxative (Milk of Magnesia or Miralax) should be taken according to package directions if there are no bowel movements after 48 hours. Unless discharge instructions indicate otherwise, you may remove your bandages 48 hours after surgery  and you may shower at that time.  You may have steri-strips (small skin tapes) in place directly over the incision.  These strips should be left on the skin for 7-10 days and will come off on their own.  If your surgeon used skin glue on the incision, you may shower in 24 hours.  The glue will flake off over the next 2-3 weeks.  Any sutures or staples will be removed at the office during your follow-up visit. ACTIVITIES:  You may resume regular daily activities (gradually increasing) beginning the next day.  Wearing a good support bra or sports bra minimizes pain and swelling.  You may have sexual intercourse when it is comfortable. You may drive when you no longer are taking prescription pain medication, you can comfortably wear a seatbelt, and you can safely maneuver your car and apply brakes. RETURN TO WORK:  ______________________________________________________________________________________ You should see your doctor in the office for a follow-up appointment approximately two weeks after your surgery.  Your doctor's nurse will typically make your follow-up appointment when she calls you with your pathology report.  Expect your pathology report 3-4 business days after your surgery.  You may call to check if you do not hear from us after three days. OTHER INSTRUCTIONS: _______________________________________________________________________________________________ _____________________________________________________________________________________________________________________________________ _____________________________________________________________________________________________________________________________________ _____________________________________________________________________________________________________________________________________  WHEN TO CALL DR Xavia Kniskern: Fever over 101.0 Nausea and/or vomiting. Extreme swelling or bruising. Continued bleeding from incision. Increased  pain, redness, or drainage from the incision.  The clinic staff is available to answer your questions during regular business hours.  Please don't hesitate to call   and ask to speak to one of the nurses for clinical concerns.  If you have a medical emergency, go to the nearest emergency room or call 911.  A surgeon from [facility] is always on call at the hospital.  For further questions, please visit [email or web address]  

## 2021-05-18 NOTE — Progress Notes (Signed)
1112 spoke with husband made aware of bruising to her left eyes

## 2021-05-18 NOTE — Anesthesia Procedure Notes (Signed)
Anesthesia Regional Block: Pectoralis block   Pre-Anesthetic Checklist: , timeout performed,  Correct Patient, Correct Site, Correct Laterality,  Correct Procedure, Correct Position, site marked,  Risks and benefits discussed,  Surgical consent,  Pre-op evaluation,  At surgeon's request and post-op pain management  Laterality: Left  Prep: chloraprep       Needles:  Injection technique: Single-shot  Needle Type: Echogenic Needle     Needle Length: 9cm  Needle Gauge: 21     Additional Needles:   Procedures:,,,, ultrasound used (permanent image in chart),,    Narrative:  Start time: 05/18/2021 8:25 AM End time: 05/18/2021 8:35 AM Injection made incrementally with aspirations every 5 mL.  Performed by: Personally  Anesthesiologist: Catalina Gravel, MD  Additional Notes: No pain on injection. No increased resistance to injection. Injection made in 5cc increments.  Good needle visualization.  Patient tolerated procedure well.

## 2021-05-18 NOTE — Transfer of Care (Signed)
Immediate Anesthesia Transfer of Care Note  Patient: Christina Jacobs  Procedure(s) Performed: LEFT BREAST LUMPECTOMY WITH RADIOACTIVE SEED AND LEFT AXILLARY SENTINEL LYMPH NODE BIOPSY (Left: Breast) RADIOACTIVE SEED GUIDED LEFT AXILLARY NODE EXCISION (Left: Breast) REMOVAL PORT-A-CATH (Breast)  Patient Location: PACU  Anesthesia Type:GA combined with regional for post-op pain  Level of Consciousness: awake  Airway & Oxygen Therapy: Patient Spontanous Breathing and Patient connected to face mask oxygen  Post-op Assessment: Report given to RN and Post -op Vital signs reviewed and stable  Post vital signs: Reviewed and stable  Last Vitals:  Vitals Value Taken Time  BP 131/55 05/18/21 1040  Temp    Pulse 60 05/18/21 1041  Resp 18 05/18/21 1041  SpO2 100 % 05/18/21 1041  Vitals shown include unvalidated device data.  Last Pain:  Vitals:   05/18/21 0800  TempSrc:   PainSc: 0-No pain      Patients Stated Pain Goal: 3 (79/15/05 6979)  Complications: No notable events documented.

## 2021-05-18 NOTE — Anesthesia Procedure Notes (Addendum)
Procedure Name: LMA Insertion Date/Time: 05/18/2021 9:12 AM Performed by: Asher Muir, CRNA Pre-anesthesia Checklist: Patient being monitored, Patient identified, Emergency Drugs available and Suction available Patient Re-evaluated:Patient Re-evaluated prior to induction Oxygen Delivery Method: Circle system utilized Preoxygenation: Pre-oxygenation with 100% oxygen Induction Type: IV induction Ventilation: Mask ventilation without difficulty LMA: LMA inserted LMA Size: 4.0 Number of attempts: 1 Dental Injury: Teeth and Oropharynx as per pre-operative assessment

## 2021-05-18 NOTE — Anesthesia Postprocedure Evaluation (Signed)
Anesthesia Post Note  Patient: Christina Jacobs  Procedure(s) Performed: LEFT BREAST LUMPECTOMY WITH RADIOACTIVE SEED AND LEFT AXILLARY SENTINEL LYMPH NODE BIOPSY (Left: Breast) RADIOACTIVE SEED GUIDED LEFT AXILLARY NODE EXCISION (Left: Breast) REMOVAL PORT-A-CATH (Breast)     Patient location during evaluation: PACU Anesthesia Type: General Level of consciousness: awake and alert, awake and oriented Pain management: pain level controlled Vital Signs Assessment: post-procedure vital signs reviewed and stable Respiratory status: spontaneous breathing, nonlabored ventilation, respiratory function stable and patient connected to nasal cannula oxygen Cardiovascular status: blood pressure returned to baseline and stable Postop Assessment: no apparent nausea or vomiting Anesthetic complications: no   No notable events documented.  Last Vitals:  Vitals:   05/18/21 1110 05/18/21 1125  BP: (!) 111/55 (!) 121/53  Pulse: (!) 56 (!) 56  Resp: 14 18  Temp:    SpO2: 97% 97%    Last Pain:  Vitals:   05/18/21 1125  TempSrc:   PainSc: 3                  Catalina Gravel

## 2021-05-18 NOTE — H&P (Signed)
Christina Jacobs is an 80 y.o. female.   Chief Complaint: breast cancer HPI: 56 yof who has fh in [facility] presented with palpable mass in left breast.  she was noted on mm to have a 2.5 cm mass and an enlarged node.  US shows a 2.4x2.6x1.1 cm mass. there is a 1.3 cm node.  biopsy of the node is positive for carcinoma. 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 she had high risk mammaprint and has undergone primary systemic therapy, last about a month ago due to issues. she was due to see me 6/1 then had covid (lab dx was actually 6/6).  she is still coughing but recovering slowly. she had repeat mm/us yesterday that shows some mild improvement with therapy.  she lives in [place].  she has history of afib on eliquis, MG on long term prednisone.  she has difficulty walking up steps and lying flat   Past Medical History:  Diagnosis Date   Adenomatous colon polyp    Anal fissure    Anxiety    Asthma    Dr. Halford Chessman   Atrial fibrillation [facility])    Bowel obstruction (HCC)    Bursitis    CKD (chronic kidney disease) stage 3, GFR 30-59 ml/min ([place])    sees Dr. [name]    Complication of anesthesia    post op N/V   Diverticulosis    Dyspnea    After a meal   Dysrhythmia    Afib   GERD (gastroesophageal reflux disease)    Glaucoma    Hiatal hernia    Giant type IV hiatal hernia; sees Dr. [name] at [facility]   Hyperlipidemia    Hypertension    Hypothyroidism    Myasthenia gravis ([place]) 2013   followed at [facility] Dr Scheryl Marten, diplopia only   Osteoarthritis    see's Dr. [name]   Osteoporosis    last DEXA 08-03-11    Past Surgical History:  Procedure Laterality Date   ABDOMINAL HYSTERECTOMY     APPENDECTOMY     BREAST LUMPECTOMY Bilateral     several, enign breast lumps removed   CARDIOVERSION N/A 12/31/2017   Procedure: CARDIOVERSION;  Surgeon: Josue Hector, MD;  Location: [place];  Service:  Cardiovascular;  Laterality: N/A;   CARDIOVERSION N/A 02/11/2018   Procedure: CARDIOVERSION;  Surgeon: Josue Hector, MD;  Location: [place];  Service: Cardiovascular;  Laterality: N/A;   COLONOSCOPY  09/27/2010   per Dr. Sharlett Iles, benign polyp, no repeats needed    [place]  10/18/2010   Dr. Hassell Done attempted but could reduce this, the entire stomach is above the diaphragm   PORTACATH PLACEMENT Right 01/25/2021   Procedure: INSERTION PORT-A-CATH;  Surgeon: [name], MD;  Location: [facility];  Service: General;  Laterality: Right;  START TIME OF 8:30 AM FOR 60 MINUTES ROOM 4 Joannie Medine IQ   THYROIDECTOMY, PARTIAL  1999   TONSILLECTOMY      Family History  Problem Relation Age of Onset   Breast cancer Maternal Grandmother    Diabetes Father    Hyperlipidemia Father    Hypertension Father    Cancer Father        origin unknown   Kidney cancer Mother    Clotting disorder Brother    Cancer Sister        2015,  anal ca   Colon cancer Son 102   Social History:  reports that she has never smoked. She has never used smokeless tobacco. She reports that she does not drink alcohol and does not use drugs.  Allergies:  Allergies  Allergen Reactions   Codeine Hives   Eliquis [Apixaban] Hives   Propoxyphene Hcl Hives   Meperidine Hcl Rash   Tape Rash and Other (See Comments)    EKG patches EKG patches    Medications Prior to Admission  Medication Sig Dispense Refill   albuterol (VENTOLIN HFA) 108 (90 Base) MCG/ACT inhaler Inhale 2 puffs into the lungs every 4 (four) hours as needed for wheezing or shortness of breath. 18 g 5   atorvastatin (LIPITOR) 10 MG tablet TAKE 1 TABLET BY MOUTH EVERY DAY (Patient taking differently: Take 10 mg by mouth every evening.) 90 tablet 3   flecainide (TAMBOCOR) 50 MG tablet TAKE 1 TABLET BY MOUTH TWICE A DAY (Patient taking differently: Take 50 mg by mouth 2 (two) times daily.) 180 tablet 2   latanoprost (XALATAN) 0.005  % ophthalmic solution Place 1 drop into both eyes at bedtime.     levothyroxine (SYNTHROID) 100 MCG tablet TAKE 1 TABLET BY MOUTH EVERY DAY (Patient taking differently: Take 100 mcg by mouth every evening.) 90 tablet 3   metoprolol succinate (TOPROL-XL) 50 MG 24 hr tablet TAKE 100 MG IN THE MORNING AND TAKE 50 MG IN THE EVENING. TAKE WITH OR IMMEDIATELY FOLLOWING A MEAL. (Patient taking differently: Take 50 mg by mouth in the morning.) 270 tablet 3   Multiple Vitamin (MULTIVITAMIN WITH MINERALS) TABS tablet Take 1 tablet by mouth in the morning. Centrum for Women     omeprazole (PRILOSEC) 40 MG capsule TAKE 1 CAPSULE (40 MG TOTAL) BY MOUTH DAILY AS NEEDED (REFLUX). (Patient taking differently: Take 40 mg by mouth in the morning.) 90 capsule 3   predniSONE (DELTASONE) 1 MG tablet Take 4 tablets (4 mg total) by mouth daily with breakfast.     Rivaroxaban (XARELTO) 15 MG TABS tablet TAKE 1 TABLET (15 MG TOTAL) BY MOUTH DAILY WITH SUPPER. (Patient taking differently: Take 15 mg by mouth daily with supper. TAKE 1 TABLET (15 MG TOTAL) BY MOUTH DAILY WITH SUPPER.) 90 tablet 1   timolol (TIMOPTIC) 0.5 % ophthalmic solution Place 1 drop into both eyes in the morning and at bedtime.     triamterene-hydrochlorothiazide (DYAZIDE) 37.5-25 MG capsule Take 1 each (1 capsule total) by mouth daily. (Patient taking differently: Take 1 capsule by mouth in the morning.) 90 capsule 3   famotidine (PEPCID) 20 MG tablet Take 1 tablet (20 mg total) by mouth 2 (two) times daily. (Patient not taking: Reported on 05/04/2021) 60 tablet 0   lidocaine-prilocaine (EMLA) cream Apply to affected area once (Patient not taking: Reported on 05/04/2021) 30 g 3   ondansetron (ZOFRAN) 8 MG tablet TAKE 1 TABLET (8 MG TOTAL) BY MOUTH 2 (TWO) TIMES DAILY AS NEEDED FOR REFRACTORY NAUSEA / VOMITING. START ON DAY 3 AFTER CHEMO. (Patient not taking: Reported on 05/04/2021) 30 tablet 1   prochlorperazine (COMPAZINE) 10 MG tablet Take 1 tablet (10 mg  total) by mouth every 6 (six) hours as needed (Nausea or vomiting). (Patient not taking: Reported on 05/04/2021) 30 tablet 1   triamcinolone cream (KENALOG) 0.1 % APPLY TO AFFECTED AREA TWICE A DAY 90 g 0    No results found for this or any previous visit (from the past 48 hour(s)). No results found.  Review of Systems  All other systems reviewed and are negative.  Blood pressure (!) 150/72, pulse 67, temperature (!) 97.5 F (36.4 C), temperature source Oral, resp. rate 18, height 5\' 3"  (1.6 m), weight 68.9 kg, SpO2 97 %. Physical Exam  General Mental Status - Alert. Orientation - Oriented X3. Breast Nipples - No Discharge. Breast Lump - No Palpable Breast Mass. Lymphatic Head & Neck General Head & Neck Lymphatics: Bilateral - Description - Normal. Axillary General Axillary Region: Bilateral - Description - Normal. 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 We discussed up to a 5% risk lifetime of chronic shoulder pain as well as lymphedema associated with a sentinel lymph node biopsy. We discussed the options for treatment of the breast cancer which included lumpectomy versus a mastectomy. We discussed the performance of the lumpectomy with radioactive seed placement. We discussed a 5-10% chance of a positive margin requiring reexcision in the operating room. We discussed mastectomy and the postoperative care for that as well. Mastectomy can be followed by reconstruction. The decision for lumpectomy vs mastectomy has no impact on decision for chemotherapy. We discussed that there is no difference in her survival whether she undergoes lumpectomy with radiation therapy or antiestrogen therapy versus a mastectomy. There is also no real difference between her recurrence in the breast. due to covid I  think waiting some time better especially with her comorbidities.  [name], MD 05/18/2021, 7:58 AM

## 2021-05-18 NOTE — Interval H&P Note (Signed)
History and Physical Interval Note:  05/18/2021 8:00 AM  Christina Jacobs  has presented today for surgery, with the diagnosis of BREAST CANCER.  The various methods of treatment have been discussed with the patient and family. After consideration of risks, benefits and other options for treatment, the patient has consented to  Procedure(s): LEFT BREAST LUMPECTOMY WITH RADIOACTIVE SEED AND LEFT AXILLARY SENTINEL LYMPH NODE BIOPSY (Left) RADIOACTIVE SEED GUIDED LEFT AXILLARY NODE EXCISION (Left) REMOVAL PORT-A-CATH (N/A) as a surgical intervention.  The patient's history has been reviewed, patient examined, no change in status, stable for surgery.  I have reviewed the patient's chart and labs.  Questions were answered to the patient's satisfaction.     [name]

## 2021-05-19 ENCOUNTER — Encounter (HOSPITAL_COMMUNITY): Payer: Self-pay | Admitting: General Surgery

## 2021-05-21 NOTE — Progress Notes (Signed)
Cardiology Office Note   Date:  05/23/2021   ID:  Christina Jacobs, DOB 17-Oct-1941, MRN [id number]  PCP:  Christina Morale, MD  Cardiologist:  Dr. Johnsie Cancel, MD  Chief Complaint  Patient presents with   Follow-up    History of Present Illness: Christina Jacobs is a 80 y.o. female who presents for follow-up, seen for Dr. Johnsie Jacobs.   Christina Jacobs has a history of paroxysmal atrial fibrillation diagnosed 12/2017 previously on Eliquis however with an allergic reaction transition to Xarelto for anticoagulation (low-dose secondary to CKD).  She previously underwent DCCV cardioversion however this failed and she was started on flecainide after normal ETT 01/31/2018.  Echocardiogram from 12/2017 with an EF at 55 to 60% and no structural heart disease.  She ultimately underwent successful DCCV while on a AT 01/2018.  Post cardioversion, she wore a monitor which showed 99% NSR.  Also with a history of CKD stage III, HLD, HTN, hypothyroidism, myasthenia gravis and recent diagnosis of breast cancer s/p left breast radioactive seed guided lumpectomy, sentinel lymph node biopsy and lymph node excision on 05/18/2021.  On last follow-up with Dr. Johnsie Jacobs appears that she was told that her beta-blocker was making her kidneys worse.  She was educated about this.  Noted to be on low-dose Toprol as higher doses exacerbates her myasthenia gravis.  Potentially was to stop diuretic with transition to amlodipine or hydralazine.  It appears she recently underwent lymph node biopsy and excision with Christina Jacobs on 05/18/2021.  Today she is here for follow up and states that she has been doing really well from a CV standpoint. They will have the results of her biopsy today which is making her feel anxious. She has undergone chemotherapy and plans for radiation. She seems to be taking everything well. She denies palpitations, LE edema, SOB, PND, dizziness, or syncope. Labs prior to biopsy showed hypokalemia and she is on  HCTZ. Will recheck BMET today  Past Medical History:  Diagnosis Date   Adenomatous colon polyp    Anal fissure    Anxiety    Asthma    Dr. Halford Jacobs   Atrial fibrillation [facility])    Bowel obstruction ([place])    Bursitis    CKD (chronic kidney disease) stage 3, GFR 30-59 ml/min ([place])    sees Dr. Joanne Jacobs    Complication of anesthesia    post op N/V   Diverticulosis    Dyspnea    After a meal   Dysrhythmia    Afib   GERD (gastroesophageal reflux disease)    Glaucoma    Hiatal hernia    Giant type IV hiatal hernia; sees Christina Jacobs at [facility]   Hyperlipidemia    Hypertension    Hypothyroidism    Myasthenia gravis ([place]) 2013   followed at [facility] Dr Christina Jacobs, diplopia only   Osteoarthritis    see's Dr. Lynann Jacobs   Osteoporosis    last DEXA 08-03-11    Past Surgical History:  Procedure Laterality Date   ABDOMINAL HYSTERECTOMY     APPENDECTOMY     BREAST LUMPECTOMY Bilateral     several, enign breast lumps removed   BREAST LUMPECTOMY WITH RADIOACTIVE SEED AND SENTINEL LYMPH NODE BIOPSY Left 05/18/2021   Procedure: LEFT BREAST LUMPECTOMY WITH RADIOACTIVE SEED AND LEFT AXILLARY SENTINEL LYMPH NODE BIOPSY;  Surgeon: Christina Bookbinder, MD;  Location: [place];  Service: General;  Laterality: Left;   CARDIOVERSION N/A 12/31/2017   Procedure: CARDIOVERSION;  Surgeon: Christina Hector, MD;  Location: [facility] ENDOSCOPY;  Service: Cardiovascular;  Laterality: N/A;   CARDIOVERSION N/A 02/11/2018   Procedure: CARDIOVERSION;  Surgeon: Christina Hector, MD;  Location: [facility] ENDOSCOPY;  Service: Cardiovascular;  Laterality: N/A;   COLONOSCOPY  09/27/2010   per Dr. Sharlett Jacobs, benign polyp, no repeats needed    [place]  10/18/2010   Dr. Hassell Jacobs attempted but could reduce this, the entire stomach is above the diaphragm   PORT-A-CATH REMOVAL N/A 05/18/2021   Procedure: REMOVAL PORT-A-CATH;  Surgeon: Christina Bookbinder, MD;  Location: [place];  Service: General;  Laterality:  N/A;   PORTACATH PLACEMENT Right 01/25/2021   Procedure: INSERTION PORT-A-CATH;  Surgeon: Christina Bookbinder, MD;  Location: [facility];  Service: General;  Laterality: Right;  START TIME OF 8:30 AM FOR 60 MINUTES ROOM 4 [place]   RADIOACTIVE SEED GUIDED AXILLARY SENTINEL LYMPH NODE Left 05/18/2021   Procedure: RADIOACTIVE SEED GUIDED LEFT AXILLARY NODE EXCISION;  Surgeon: Christina Bookbinder, MD;  Location: [place];  Service: General;  Laterality: Left;   THYROIDECTOMY, PARTIAL  1999   TONSILLECTOMY       Current Outpatient Medications  Medication Sig Dispense Refill   albuterol (VENTOLIN HFA) 108 (90 Base) MCG/ACT inhaler Inhale 2 puffs into the lungs every 4 (four) hours as needed for wheezing or shortness of breath. 18 g 5   atorvastatin (LIPITOR) 10 MG tablet TAKE 1 TABLET BY MOUTH EVERY DAY 90 tablet 3   flecainide (TAMBOCOR) 50 MG tablet TAKE 1 TABLET BY MOUTH TWICE A DAY 180 tablet 2   latanoprost (XALATAN) 0.005 % ophthalmic solution Place 1 drop into both eyes at bedtime.     levothyroxine (SYNTHROID) 100 MCG tablet TAKE 1 TABLET BY MOUTH EVERY DAY 90 tablet 3   metoprolol succinate (TOPROL-XL) 50 MG 24 hr tablet TAKE 100 MG IN THE MORNING AND TAKE 50 MG IN THE EVENING. TAKE WITH OR IMMEDIATELY FOLLOWING A MEAL. 270 tablet 3   Multiple Vitamin (MULTIVITAMIN WITH MINERALS) TABS tablet Take 1 tablet by mouth in the morning. Centrum for Women     omeprazole (PRILOSEC) 40 MG capsule TAKE 1 CAPSULE (40 MG TOTAL) BY MOUTH DAILY AS NEEDED (REFLUX). 90 capsule 3   predniSONE (DELTASONE) 1 MG tablet Take 4 tablets (4 mg total) by mouth daily with breakfast.     Rivaroxaban (XARELTO) 15 MG TABS tablet TAKE 1 TABLET (15 MG TOTAL) BY MOUTH DAILY WITH SUPPER. 90 tablet 1   timolol (TIMOPTIC) 0.5 % ophthalmic solution Place 1 drop into both eyes in the morning and at bedtime.     traMADol (ULTRAM) 50 MG tablet Take 2 tablets (100 mg total) by mouth every 6 (six) hours as needed. 10 tablet 0    triamcinolone cream (KENALOG) 0.1 % APPLY TO AFFECTED AREA TWICE A DAY 90 g 0   triamterene-hydrochlorothiazide (DYAZIDE) 37.5-25 MG capsule Take 1 each (1 capsule total) by mouth daily. 90 capsule 3   No current facility-administered medications for this visit.    Allergies:   Codeine, Eliquis [apixaban], Propoxyphene hcl, Meperidine hcl, and Tape    Social History:  The patient  reports that she has never smoked. She has never used smokeless tobacco. She reports that she does not drink alcohol and does not use drugs.   Family History:  The patient's family history includes Breast cancer in her maternal grandmother; Cancer in her father and sister; Clotting disorder in her brother; Colon cancer (age  of onset: 43) in her son; Diabetes in her father; Hyperlipidemia in her father; Hypertension in her father; Kidney cancer in her mother.    ROS:  Please see the history of present illness. Otherwise, review of systems are positive for none.   All other systems are reviewed and negative.    PHYSICAL EXAM: VS:  BP 128/80   Pulse 74   Ht 5\' 3"  (1.6 m)   Wt 154 lb 12.8 oz (70.2 kg)   SpO2 97%   BMI 27.42 kg/m  , BMI Body mass index is 27.42 kg/m.  General: Well developed, well nourished, NAD Lungs:Clear to ausculation bilaterally. No wheezes, rales, or rhonchi. Breathing is unlabored. Cardiovascular: RRR with S1 S2. No murmurs Extremities: No edema. Neuro: Alert and oriented. No focal deficits. No facial asymmetry. MAE spontaneously. Psych: Responds to questions appropriately with normal affect.    EKG:  EKG is not ordered today.  Recent Labs: 08/23/2020: TSH 0.57 03/30/2021: ALT 13 05/11/2021: BUN 16; Creatinine, Ser 1.33; Hemoglobin 11.7; Platelets 83; Potassium 3.2; Sodium 137    Lipid Panel    Component Value Date/Time   CHOL 180 08/23/2020 1144   TRIG 138 08/23/2020 1144   HDL 65 08/23/2020 1144   CHOLHDL 2.8 08/23/2020 1144   VLDL 29.2 08/19/2019 1048   LDLCALC 91  08/23/2020 1144   LDLDIRECT 142.2 07/27/2013 1007      Wt Readings from Last 3 Encounters:  05/23/21 154 lb 12.8 oz (70.2 kg)  05/18/21 152 lb (68.9 kg)  05/11/21 152 lb 11.2 oz (69.3 kg)     ASSESSMENT AND PLAN:  1. PAF: -Maintaining NSR per ascultation today  -Continue Flecainide, Xarelto, and beta blocker  -No s/s of bleeding in stool or urine.  -CBC stable on recent labs -On reduced dose Xarelto due to renal dysfunction   2. CKD stage III: -Creatinine, 1.3 on recnet labs prior to biopsy  -Follows with nephrology   -May consider discontinuation of HCTZ    3.  HLD:  -Last LDL, 79 -Continue atorvastatin   4. HTN: -Controlled today at 128/80 -Continue current regimen however may d/c HCTZ of recurrent hypokalemia or worsening renal function on updated labs from today   5. Breast cancer: -Recently dx>>has undergone chemotherapy  -Biopsy last week with pending results -Plans for radiation    Current medicines are reviewed at length with the patient today.  The patient does not have concerns regarding medicines.  The following changes have been made:  no change  Labs/ tests ordered today include: BMET  Orders Placed This Encounter  Procedures   Basic metabolic panel     Disposition:   FU with Dr. Johnsie Jacobs in 6 months  Signed, Christina Drown, NP  05/23/2021 10:34 AM    [address], [place], [place]  [address] Phone: [phone]; Fax: [phone]

## 2021-05-23 ENCOUNTER — Other Ambulatory Visit: Payer: Self-pay

## 2021-05-23 ENCOUNTER — Encounter (HOSPITAL_COMMUNITY): Payer: Self-pay | Admitting: General Surgery

## 2021-05-23 ENCOUNTER — Ambulatory Visit: Payer: Medicare HMO | Admitting: Cardiology

## 2021-05-23 VITALS — BP 128/80 | HR 74 | Ht 63.0 in | Wt 154.8 lb

## 2021-05-23 DIAGNOSIS — I480 Paroxysmal atrial fibrillation: Secondary | ICD-10-CM

## 2021-05-23 DIAGNOSIS — C50919 Malignant neoplasm of unspecified site of unspecified female breast: Secondary | ICD-10-CM | POA: Diagnosis not present

## 2021-05-23 DIAGNOSIS — E875 Hyperkalemia: Secondary | ICD-10-CM | POA: Diagnosis not present

## 2021-05-23 DIAGNOSIS — E785 Hyperlipidemia, unspecified: Secondary | ICD-10-CM | POA: Diagnosis not present

## 2021-05-23 LAB — BASIC METABOLIC PANEL WITH GFR
BUN/Creatinine Ratio: 15 (ref 12–28)
BUN: 22 mg/dL (ref 8–27)
CO2: 26 mmol/L (ref 20–29)
Calcium: 9.1 mg/dL (ref 8.7–10.3)
Chloride: 104 mmol/L (ref 96–106)
Creatinine, Ser: 1.42 mg/dL — ABNORMAL HIGH (ref 0.57–1.00)
Glucose: 92 mg/dL (ref 65–99)
Potassium: 3.7 mmol/L (ref 3.5–5.2)
Sodium: 142 mmol/L (ref 134–144)
eGFR: 37 mL/min/{1.73_m2} — ABNORMAL LOW

## 2021-05-23 NOTE — Patient Instructions (Signed)
Medication Instructions:  Your physician recommends that you continue on your current medications as directed. Please refer to the Current Medication list given to you today.  *If you need a refill on your cardiac medications before your next appointment, please call your pharmacy*   Lab Work: TODAY: BMET If you have labs (blood work) drawn today and your tests are completely normal, you will receive your results only by: [place] (if you have MyChart) OR A paper copy in the mail If you have any lab test that is abnormal or we need to change your treatment, we will call you to review the results.   Testing/Procedures: NONE   Follow-Up: At [facility], you and your health needs are our priority.  As part of our continuing mission to provide you with exceptional heart care, we have created designated Provider Care Teams.  These Care Teams include your primary Cardiologist (physician) and Advanced Practice Providers (APPs -  Physician Assistants and Nurse Practitioners) who all work together to provide you with the care you need, when you need it.  We recommend signing up for the patient portal called "MyChart".  Sign up information is provided on this After Visit Summary.  MyChart is used to connect with patients for Virtual Visits (Telemedicine).  Patients are able to view lab/test results, encounter notes, upcoming appointments, etc.  Non-urgent messages can be sent to your provider as well.   To learn more about what you can do with MyChart, go to [email or web address].    Your next appointment:   6 month(s)  The format for your next appointment:   In Person  Provider:   You may see Jenkins Rouge, MD or one of the following Advanced Practice Providers on your designated Care Team:   Richardson Dopp, PA-C [place], [place]

## 2021-05-24 ENCOUNTER — Encounter: Payer: Self-pay | Admitting: *Deleted

## 2021-05-25 NOTE — Progress Notes (Signed)
Concern came in today  Patient Care Team: Laurey Morale, MD as PCP - General Josue Hector, MD as PCP - Cardiology (Cardiology) Elsie Stain, MD (Pulmonary Disease) [name], MD as Consulting Physician (General Surgery) [name], MD as Consulting Physician (General Surgery) [name], MD as Consulting Physician (Hematology and Oncology) Kyung Rudd, MD as Consulting Physician (Radiation Oncology) Rockwell Germany, RN as Oncology Nurse Navigator Mauro Kaufmann, RN as Oncology Nurse Navigator  DIAGNOSIS:    ICD-10-CM   1. Malignant neoplasm of upper-outer quadrant of left breast in female, estrogen receptor positive ([place])  C50.412    Z17.0       SUMMARY OF ONCOLOGIC HISTORY: Oncology History  Malignant neoplasm of upper-outer quadrant of left breast in female, estrogen receptor positive ([place])  01/02/2021 Initial Diagnosis   Patient experienced chronic left breast tenderness for 2 years with a negative mammogram last year. Screening mammogram showed a 2.5cm central left breast mass and one enlarged lymph node. US showed a 2.6cm mass at the 12 o'clock position and a 1.3cm mass in the left axillary tail. 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 Donne Hazel: Residual IDC 2.2 cm, DCIS intermediate grade, margins negative, 1/2 lymph nodes positive ER 40% weak, PR negative, HER2 negative, Ki-67 20%     CHIEF  COMPLIANT:  Follow-up of left breast cancer  INTERVAL HISTORY: Christina Jacobs is a 80 y.o. with above-mentioned history of left breast cancer having undergone chemotherapy. She underwent a left breast lumpectomy on 05/18/21 with Dr. Donne Hazel for which the pathology showed 2.2 cm residual invasive ductal carcinoma with calcifications, intermediate grade DCIS, posterior margin negative for carcinoma, and one axillary lymph node positive for metastatic carcinoma. She reports to the clinic today for follow-up and to review the pathology report.   ALLERGIES:  is allergic to codeine, eliquis [apixaban], propoxyphene hcl, meperidine hcl, and tape.  MEDICATIONS:  Current Outpatient Medications  Medication Sig Dispense Refill   albuterol (VENTOLIN HFA) 108 (90 Base) MCG/ACT inhaler Inhale 2 puffs into the lungs every 4 (four) hours as needed for wheezing or shortness of breath. 18 g 5   atorvastatin (LIPITOR) 10 MG tablet TAKE 1 TABLET BY MOUTH EVERY DAY 90 tablet 3   flecainide (TAMBOCOR) 50 MG tablet TAKE 1 TABLET BY MOUTH TWICE A DAY 180 tablet 2   latanoprost (XALATAN) 0.005 % ophthalmic solution Place 1 drop into both eyes at bedtime.     levothyroxine (SYNTHROID) 100 MCG tablet TAKE 1 TABLET BY MOUTH EVERY DAY 90 tablet 3   metoprolol succinate (TOPROL-XL) 50 MG 24 hr tablet TAKE 100 MG IN THE MORNING AND TAKE 50 MG IN THE EVENING. TAKE WITH OR IMMEDIATELY FOLLOWING A MEAL. 270 tablet 3   Multiple Vitamin (MULTIVITAMIN WITH MINERALS) TABS tablet Take 1 tablet by mouth in the morning. Centrum for Women     omeprazole (PRILOSEC) 40  MG capsule TAKE 1 CAPSULE (40 MG TOTAL) BY MOUTH DAILY AS NEEDED (REFLUX). 90 capsule 3   predniSONE (DELTASONE) 1 MG tablet Take 4 tablets (4 mg total) by mouth daily with breakfast.     Rivaroxaban (XARELTO) 15 MG TABS tablet TAKE 1 TABLET (15 MG TOTAL) BY MOUTH DAILY WITH SUPPER. 90 tablet 1   timolol (TIMOPTIC) 0.5 % ophthalmic solution Place 1 drop into both eyes in  the morning and at bedtime.     traMADol (ULTRAM) 50 MG tablet Take 2 tablets (100 mg total) by mouth every 6 (six) hours as needed. 10 tablet 0   triamcinolone cream (KENALOG) 0.1 % APPLY TO AFFECTED AREA TWICE A DAY 90 g 0   triamterene-hydrochlorothiazide (DYAZIDE) 37.5-25 MG capsule Take 1 each (1 capsule total) by mouth daily. 90 capsule 3   No current facility-administered medications for this visit.    PHYSICAL EXAMINATION: ECOG PERFORMANCE STATUS: 1 - Symptomatic but completely ambulatory  Vitals:   05/26/21 0855  BP: 130/61  Pulse: 70  Resp: 18  Temp: 97.6 F (36.4 C)  SpO2: 100%   Filed Weights   05/26/21 0855  Weight: 154 lb 3.2 oz (69.9 kg)      LABORATORY DATA:  I have reviewed the data as listed CMP Latest Ref Rng & Units 05/23/2021 05/11/2021 03/30/2021  Glucose 65 - 99 mg/dL 92 102(H) 125(H)  BUN 8 - 27 mg/dL _0 Creatinine 0.57 - 1.00 mg/dL 1.42(H) 1.33(H) 1.33(H)  Sodium 134 - 144 mmol/L 142 137 140  Potassium 3.5 - 5.2 mmol/L 3.7 3.2(L) 3.3(L)  Chloride 96 - 106 mmol/L 104 105 105  CO2 20 - 29 mmol/L _1 Calcium 8.7 - 10.3 mg/dL 9.1 9.0 8.7(L)  Total Protein 6.5 - 8.1 g/dL - - 5.8(L)  Total Bilirubin 0.3 - 1.2 mg/dL - - 1.2  Alkaline Phos 38 - 126 U/L - - 53  AST 15 - 41 U/L - - 17  ALT 0 - 44 U/L - - 13    Lab Results  Component Value Date   WBC 2.9 (L) 05/11/2021   HGB 11.7 (L) 05/11/2021   HCT 36.5 05/11/2021   MCV 100.0 05/11/2021   PLT 83 (L) 05/11/2021   NEUTROABS 3.9 03/30/2021    ASSESSMENT & PLAN:  Malignant neoplasm of upper-outer quadrant of left breast in female, estrogen receptor positive ([place]) 01/02/2021: Screening mammogram detected left breast mass at 12 o'clock position 2.5 cm spiculated mass.  Abnormal left axillary lymph node 1.3 cm.  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  Neoadjuvant chemotherapy with 4 cycles of Taxotere and Cytoxan completed 03/30/2021 2. 05/18/2021: Left lumpectomy with Dr. Donne Hazel: Residual IDC 2.2 cm, DCIS intermediate grade, margins negative, 1/2 lymph nodes positive ER 40% weak, PR negative, HER2 negative, Ki-67 20% 3.  Adjuvant radiation 4.  Follow-up adjuvant antiestrogen therapy with anastrozole 1 mg daily x7 years ------------------------------------------------------------------------------------------------------------ Pathology counseling: I discussed the final pathology report of the patient provided  a copy of this report. I discussed the margins as well as lymph node surgeries. We also discussed the final staging along with previously performed ER/PR and HER-2/neu testing.  Return to clinic after radiation is complete.    No orders of the defined types were placed in  this encounter.  The patient has a good understanding of the overall plan. she agrees with it. she will call with any problems that may develop before the next visit here.  Total time spent: 30 mins including face to face time and time spent for planning, charting and coordination of care  Rulon Eisenmenger, MD, MPH 05/26/2021  I, Thana Ates, am acting as scribe for Dr. [name].  I have reviewed the above documentation for accuracy and completeness, and I agree with the above.

## 2021-05-26 ENCOUNTER — Inpatient Hospital Stay: Payer: Medicare HMO | Attending: Hematology and Oncology | Admitting: Hematology and Oncology

## 2021-05-26 ENCOUNTER — Other Ambulatory Visit: Payer: Self-pay

## 2021-05-26 DIAGNOSIS — Z170 Estrogen receptor positive status [ER+]: Secondary | ICD-10-CM | POA: Diagnosis not present

## 2021-05-26 DIAGNOSIS — C50412 Malignant neoplasm of upper-outer quadrant of left female breast: Secondary | ICD-10-CM | POA: Diagnosis present

## 2021-05-26 DIAGNOSIS — C773 Secondary and unspecified malignant neoplasm of axilla and upper limb lymph nodes: Secondary | ICD-10-CM | POA: Insufficient documentation

## 2021-05-26 NOTE — Assessment & Plan Note (Signed)
01/02/2021: Screening mammogram detected left breast mass at 12 o'clock position 2.5 cm spiculated mass. Abnormal left axillary lymph node 1.3 cm. 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 Donne Hazel: Residual IDC 2.2 cm, DCIS intermediate grade, margins negative, 1/2 lymph nodes positive ER 40% weak, PR negative, HER2 negative, Ki-67 20% 3.Adjuvant radiation 4.Follow-up adjuvant antiestrogen therapy with anastrozole 1 mg daily x7 years ------------------------------------------------------------------------------------------------------------ Pathology counseling: I discussed the final pathology report of the patient provided  a copy of this report. I discussed the margins as well as lymph node surgeries. We also discussed the final staging along with previously performed ER/PR and HER-2/neu testing.  Return to clinic after radiation is complete.

## 2021-05-27 ENCOUNTER — Other Ambulatory Visit: Payer: Self-pay | Admitting: Family Medicine

## 2021-05-31 LAB — SURGICAL PATHOLOGY

## 2021-06-19 ENCOUNTER — Ambulatory Visit: Payer: Medicare HMO | Attending: General Surgery | Admitting: Physical Therapy

## 2021-06-19 ENCOUNTER — Other Ambulatory Visit: Payer: Self-pay

## 2021-06-19 ENCOUNTER — Encounter: Payer: Self-pay | Admitting: Physical Therapy

## 2021-06-19 DIAGNOSIS — M25612 Stiffness of left shoulder, not elsewhere classified: Secondary | ICD-10-CM

## 2021-06-19 DIAGNOSIS — Z483 Aftercare following surgery for neoplasm: Secondary | ICD-10-CM | POA: Insufficient documentation

## 2021-06-19 DIAGNOSIS — C50412 Malignant neoplasm of upper-outer quadrant of left female breast: Secondary | ICD-10-CM | POA: Diagnosis present

## 2021-06-19 DIAGNOSIS — M25512 Pain in left shoulder: Secondary | ICD-10-CM

## 2021-06-19 DIAGNOSIS — R293 Abnormal posture: Secondary | ICD-10-CM

## 2021-06-19 DIAGNOSIS — Z170 Estrogen receptor positive status [ER+]: Secondary | ICD-10-CM | POA: Insufficient documentation

## 2021-06-19 NOTE — Therapy (Signed)
[place], [place], [address] Phone: [phone]   Fax:  [phone]  Physical Therapy Treatment  Patient Details  Name: Christina Jacobs MRN: [id number] Date of Birth: 1941/05/07 Referring Provider (PT): Dr. [name]   Encounter Date: 06/19/2021   PT End of Session - 06/19/21 1417     Visit Number 1    Number of Visits 10    Date for PT Re-Evaluation 07/17/21    PT Start Time 1255    PT Stop Time 1425    PT Time Calculation (min) 90 min    Activity Tolerance Patient tolerated treatment well    Behavior During Therapy [facility] for tasks assessed/performed             Past Medical History:  Diagnosis Date   Adenomatous colon polyp    Anal fissure    Anxiety    Asthma    Dr. Halford Chessman   Atrial fibrillation [facility])    Bowel obstruction ([place])    Bursitis    CKD (chronic kidney disease) stage 3, GFR 30-59 ml/min ([place])    sees Dr. [name]    Complication of anesthesia    post op N/V   Diverticulosis    Dyspnea    After a meal   Dysrhythmia    Afib   GERD (gastroesophageal reflux disease)    Glaucoma    Hiatal hernia    Giant type IV hiatal hernia; sees Dr. [name] at [facility]   Hyperlipidemia    Hypertension    Hypothyroidism    Myasthenia gravis ([place]) 2013   followed at [facility] Dr Scheryl Marten, diplopia only   Osteoarthritis    see's Dr. [name]   Osteoporosis    last DEXA 08-03-11    Past Surgical History:  Procedure Laterality Date   ABDOMINAL HYSTERECTOMY     APPENDECTOMY     BREAST LUMPECTOMY Bilateral     several, enign breast lumps removed   BREAST LUMPECTOMY WITH RADIOACTIVE SEED AND SENTINEL LYMPH NODE BIOPSY Left 05/18/2021   Procedure: LEFT BREAST LUMPECTOMY WITH RADIOACTIVE SEED AND LEFT AXILLARY SENTINEL LYMPH NODE BIOPSY;  Surgeon: [name], MD;  Location: [place];  Service: General;  Laterality: Left;   CARDIOVERSION N/A 12/31/2017    Procedure: CARDIOVERSION;  Surgeon: Josue Hector, MD;  Location: [place];  Service: Cardiovascular;  Laterality: N/A;   CARDIOVERSION N/A 02/11/2018   Procedure: CARDIOVERSION;  Surgeon: Josue Hector, MD;  Location: [facility] ENDOSCOPY;  Service: Cardiovascular;  Laterality: N/A;   COLONOSCOPY  09/27/2010   per Dr. Sharlett Iles, benign polyp, no repeats needed    [place]  10/18/2010   Dr. Hassell Done attempted but could reduce this, the entire stomach is above the diaphragm   PORT-A-CATH REMOVAL N/A 05/18/2021   Procedure: REMOVAL PORT-A-CATH;  Surgeon: [name], MD;  Location: [place];  Service: General;  Laterality: N/A;   PORTACATH PLACEMENT Right 01/25/2021   Procedure: INSERTION PORT-A-CATH;  Surgeon: [name], MD;  Location: [facility];  Service: General;  Laterality: Right;  START TIME OF 8:30 AM FOR 60 MINUTES ROOM 4 [place]   RADIOACTIVE SEED GUIDED AXILLARY SENTINEL LYMPH NODE Left 05/18/2021   Procedure: RADIOACTIVE SEED GUIDED LEFT AXILLARY NODE EXCISION;  Surgeon: [name], MD;  Location: [place];  Service: General;  Laterality: Left;   THYROIDECTOMY, PARTIAL  1999   TONSILLECTOMY      There were no vitals  filed for this visit.   Subjective Assessment - 06/19/21 1300     Subjective Patient underwent neoadjuvant chemotherapy from 01/26/2021-03/11/2021 followed by a left lumpectomy and sentinel node biopsy on 05/18/2021 with 1 of 2 lymph nodes being positive for cancer. Radiation begins 06/20/2021.    Pertinent History Patient was diagnosed on 12/06/2020 with left grade III invasive ductal carcinoma breast cancer. She underwent neoadjuvant chemotherapy from 01/26/2021-03/11/2021 followed by a left lumpectomy and sentinel node biopsy on 05/18/2021 with 1 of 2 lymph nodes being positive for cancer. It is ER positive, PR negative, and HER2 negative with a Ki67 of 20%. She has a hiatal hernia which is severe and limits her ability to exercise.    Patient  Stated Goals Get my arm doing better    Currently in Pain? No/denies                [facility] PT Assessment - 06/19/21 0001       Assessment   Medical Diagnosis s/p left lumpectomy and SLNB    Referring Provider (PT) Dr. [name]    Onset Date/Surgical Date 05/18/21    Hand Dominance Right    Prior Therapy Baselines      Precautions   Precautions Other (comment)    Precaution Comments recent surgery and left arm lymphedema risk      Restrictions   Weight Bearing Restrictions No      Balance Screen   Has the patient fallen in the past 6 months No    Has the patient had a decrease in activity level because of a fear of falling?  No    Is the patient reluctant to leave their home because of a fear of falling?  No      Home Ecologist residence    Living Arrangements Spouse/significant other    Available Help at Discharge Family      Prior Function   Level of [place] Retired    Leisure She does not exercise due to hiatal hernia which impacts breathing      Cognition   Overall Cognitive Status Within Functional Limits for tasks assessed      Observation/Other Assessments   Observations Incisions appear to be well healed with palpable scar tissue present. Mild left breast edema present. Palpable and visible axillary cording present.      Posture/Postural Control   Posture/Postural Control Postural limitations    Postural Limitations Rounded Shoulders;Forward head;Increased thoracic kyphosis      ROM / Strength   AROM / PROM / Strength AROM      AROM   AROM Assessment Site Shoulder    Right/Left Shoulder Left    Left Shoulder Extension 68 Degrees    Left Shoulder Flexion 117 Degrees    Left Shoulder ABduction 125 Degrees    Left Shoulder Internal Rotation 73 Degrees    Left Shoulder External Rotation 58 Degrees               LYMPHEDEMA/ONCOLOGY QUESTIONNAIRE - 06/19/21 0001       Type    Cancer Type Left breast cancer      Surgeries   Lumpectomy Date 05/18/21    Sentinel Lymph Node Biopsy Date 05/18/21    Number Lymph Nodes Removed 2      Treatment   Active Chemotherapy Treatment No    Past Chemotherapy Treatment Yes    Date 03/11/21    Active Radiation Treatment No  Past Radiation Treatment No    Current Hormone Treatment No    Past Hormone Therapy No      What other symptoms do you have   Are you Having Heaviness or Tightness Yes    Are you having Pain No    Are you having pitting edema No    Is it Hard or Difficult finding clothes that fit No    Do you have infections No    Is there Decreased scar mobility Yes    [place] Sign No      Right Upper Extremity Lymphedema   10 cm Proximal to Olecranon Process 27.7 cm    Olecranon Process 22.2 cm    10 cm Proximal to Ulnar Styloid Process 20 cm    Just Proximal to Ulnar Styloid Process 13.8 cm    Across Hand at [employer] 17.2 cm    At [place] of 2nd Digit 5.8 cm      Left Upper Extremity Lymphedema   10 cm Proximal to Olecranon Process 27.5 cm    Olecranon Process 22.9 cm    10 cm Proximal to Ulnar Styloid Process 18.3 cm    Just Proximal to Ulnar Styloid Process 13.4 cm    Across Hand at [employer] 16 cm    At [place] of 2nd Digit 5.4 cm                Quick Dash - 06/19/21 0001     Open a tight or new jar Mild difficulty    Do heavy household chores (wash walls, wash floors) Mild difficulty    Carry a shopping bag or briefcase No difficulty    Wash your back No difficulty    Use a knife to cut food No difficulty    Recreational activities in which you take some force or impact through your arm, shoulder, or hand (golf, hammering, tennis) Unable    During the past week, to what extent has your arm, shoulder or hand problem interfered with your normal social activities with family, friends, neighbors, or groups? Not at all    During the past week, to what extent has your arm, shoulder or  hand problem limited your work or other regular daily activities Not at all    Arm, shoulder, or hand pain. Moderate    Tingling (pins and needles) in your arm, shoulder, or hand Moderate    Difficulty Sleeping No difficulty    DASH Score 22.73 %                    [facility] Adult PT Treatment/Exercise - 06/19/21 0001       Manual Therapy   Manual Therapy Myofascial release    Manual therapy comments Myofascial release ot left axilla and medial upper arm to improve cording (performed by Collie Siad, PTA)                    PT Education - 06/19/21 1355     Education Details Aftercare; scar massage; home exercises    Person(s) Educated Patient;Spouse    Methods Explanation;Demonstration;Handout    Comprehension Returned demonstration;Verbalized understanding                 PT Long Term Goals - 06/19/21 1404       PT LONG TERM GOAL #1   Title Patient will demonstrate she has regained full shoulder ROM and function post operatively compared to baselines.    Time  4    Period Weeks    Status On-going    Target Date 07/17/21      PT LONG TERM GOAL #2   Title Patient will increase left shoulder active flexion ROM to >/= 130 degrees to reach overhead without pain.    Time 4    Period Weeks    Status New    Target Date 07/17/21      PT LONG TERM GOAL #3   Title Patient will increase left shoulder active abduction ROM to >/= 130 degrees to lay in needed position for radiation without pain.    Time 4    Period Weeks    Status New    Target Date 07/17/21      PT LONG TERM GOAL #4   Title Patient will report >/= 40% reduction in left axillary pain while reaching overhead.    Time 4    Period Weeks    Status New    Target Date 07/17/21      PT LONG TERM GOAL #5   Title Patient will verbalize good understanding of lymphedema risk reduction practices.    Time 4    Period Weeks    Status New    Target Date 07/17/21                    Plan - 06/19/21 1359     Clinical Impression Statement Patient is doing well s/p left lumpectomy and sentinel node biopsy (1/2 nodes positive) on 05/18/2021. She has visible and palpable cording in her left axilla with limited shoulder ROM. She also has palpable scar tissue present at incision sites. She will benefit from PT to regain shoulder ROM and regain full shoulder function and improve her pain.    PT Frequency 2x / week    PT Duration 4 weeks    PT Treatment/Interventions ADLs/Self Care Home Management;Therapeutic exercise;Patient/family education;Manual techniques;Manual lymph drainage;Scar mobilization;Passive range of motion    PT Next Visit Plan PROM left shoulder; myofascial release left axilla and scar massage    PT Home Exercise Plan Post op shoulder ROM HEP    Consulted and Agree with Plan of Care Patient    Family Member Consulted husband             Patient will benefit from skilled therapeutic intervention in order to improve the following deficits and impairments:  Postural dysfunction, Decreased range of motion, Impaired UE functional use, Pain, Decreased knowledge of precautions, Increased fascial restricitons, Increased edema, Decreased scar mobility  Visit Diagnosis: Malignant neoplasm of upper-outer quadrant of left breast in female, estrogen receptor positive ([place]) - Plan: PT plan of care cert/re-cert  Abnormal posture - Plan: PT plan of care cert/re-cert  Aftercare following surgery for neoplasm - Plan: PT plan of care cert/re-cert  Stiffness of left shoulder, not elsewhere classified - Plan: PT plan of care cert/re-cert  Acute pain of left shoulder - Plan: PT plan of care cert/re-cert     Problem List Patient Active Problem List   Diagnosis Date Noted   Port-A-Cath in place 01/26/2021   Malignant neoplasm of upper-outer quadrant of left breast in female, estrogen receptor positive ([place]) 01/02/2021   Paroxysmal atrial fibrillation ([place])  08/19/2019   CKD (chronic kidney disease) stage 3, GFR 30-59 ml/min ([place]) 08/09/2017   Hiatal hernia 08/09/2017   Glaucoma 08/08/2016   Restrictive lung disease 09/29/2013   Pulmonary nodule, right 07/14/2012   Myasthenia gravis ([place]) 07/14/2012   HERNIA 07/17/2010  DIVERTICULOSIS, COLON 07/14/2010   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 level reflux 07/14/2010   EXTERNAL HEMORRHOIDS 07/13/2009   GERD 02/16/2009   Osteoarthrosis, unspecified whether generalized or localized, unspecified site 07/01/2008   MITRAL VALVE PROLAPSE 11/17/2007   Hypothyroidism 07/16/2007   Dyslipidemia 07/16/2007   Essential hypertension 07/16/2007   Osteoporosis 07/16/2007   Annia Friendly, PT 06/19/21 2:26 PM   [place] [address] [place], [place], [address] Phone: [phone]   Fax:  [phone]  Name: Christina Jacobs MRN: [id number] Date of Birth: [birth year]

## 2021-06-19 NOTE — Patient Instructions (Addendum)
            [facility]         [address], [place] [address]         [phone]         Annia Friendly, PT, CLT   After Breast Cancer Class It is recommended you attend the ABC class to be educated on lymphedema risk reduction. This class is free of charge and lasts for 1 hour. It is a 1-time class.  You are scheduled for September 19th at 11:00. We will send you an email with a Webex link the night before the class.  Scar massage You can begin scar massage without coconut oil (moving the skin different directions) and then with using coconut oil a few minutes each day.  Compression garment It would be good for you to get a good sports bra that zips up the front to reduce breast swelling.  Home exercise Program You should continue doing the exercises until you can do them without feeling any tightness.  Follow up PT: It is recommended you return every 3 months for the first 3 years following surgery to be assessed on the SOZO machine for an L-Dex score. This helps prevent clinically significant lymphedema in 95% of patients. These follow up screens are 10 minute appointments that you are not billed for. WE ARE SCHEDULED TO MOVE TO [place] August 07, 2021. APPOINTMENTS FOR SOZO SCREENS AFTER 08/07/2021 WILL BE LOCATED AT [facility] AT [address] [place] [address]. Please call us to confirm we have moved if your appointment is scheduled after October 3rd, 2022. The phone number is [phone]. You are scheduled for October 3rd at 10:10.

## 2021-06-20 ENCOUNTER — Ambulatory Visit
Admission: RE | Admit: 2021-06-20 | Discharge: 2021-06-20 | Disposition: A | Discharge status: Home / Self Care | Payer: Medicare HMO | Source: Ambulatory Visit | Attending: Radiation Oncology | Admitting: Radiation Oncology

## 2021-06-20 ENCOUNTER — Encounter: Payer: Self-pay | Admitting: Radiation Oncology

## 2021-06-20 VITALS — BP 143/82 | HR 60 | Temp 97.4°F | Resp 19 | Ht 64.0 in | Wt 154.0 lb

## 2021-06-20 DIAGNOSIS — I10 Essential (primary) hypertension: Secondary | ICD-10-CM | POA: Diagnosis not present

## 2021-06-20 DIAGNOSIS — Z803 Family history of malignant neoplasm of breast: Secondary | ICD-10-CM | POA: Insufficient documentation

## 2021-06-20 DIAGNOSIS — G7000 Myasthenia gravis without (acute) exacerbation: Secondary | ICD-10-CM | POA: Diagnosis not present

## 2021-06-20 DIAGNOSIS — K219 Gastro-esophageal reflux disease without esophagitis: Secondary | ICD-10-CM | POA: Diagnosis not present

## 2021-06-20 DIAGNOSIS — E785 Hyperlipidemia, unspecified: Secondary | ICD-10-CM | POA: Diagnosis not present

## 2021-06-20 DIAGNOSIS — I4891 Unspecified atrial fibrillation: Secondary | ICD-10-CM | POA: Insufficient documentation

## 2021-06-20 DIAGNOSIS — C50412 Malignant neoplasm of upper-outer quadrant of left female breast: Secondary | ICD-10-CM

## 2021-06-20 DIAGNOSIS — Z9221 Personal history of antineoplastic chemotherapy: Secondary | ICD-10-CM | POA: Insufficient documentation

## 2021-06-20 DIAGNOSIS — K449 Diaphragmatic hernia without obstruction or gangrene: Secondary | ICD-10-CM | POA: Insufficient documentation

## 2021-06-20 DIAGNOSIS — Z800 Family history of malignant neoplasm of digestive organs: Secondary | ICD-10-CM | POA: Diagnosis not present

## 2021-06-20 DIAGNOSIS — Z79899 Other long term (current) drug therapy: Secondary | ICD-10-CM | POA: Diagnosis not present

## 2021-06-20 DIAGNOSIS — M810 Age-related osteoporosis without current pathological fracture: Secondary | ICD-10-CM | POA: Insufficient documentation

## 2021-06-20 DIAGNOSIS — M1990 Unspecified osteoarthritis, unspecified site: Secondary | ICD-10-CM | POA: Diagnosis not present

## 2021-06-20 DIAGNOSIS — Z79811 Long term (current) use of aromatase inhibitors: Secondary | ICD-10-CM | POA: Insufficient documentation

## 2021-06-20 DIAGNOSIS — Z170 Estrogen receptor positive status [ER+]: Secondary | ICD-10-CM | POA: Diagnosis not present

## 2021-06-20 DIAGNOSIS — Z8052 Family history of malignant neoplasm of bladder: Secondary | ICD-10-CM | POA: Diagnosis not present

## 2021-06-20 DIAGNOSIS — Z510 Encounter for antineoplastic radiation therapy: Secondary | ICD-10-CM | POA: Insufficient documentation

## 2021-06-20 DIAGNOSIS — Z86010 Personal history of colonic polyps: Secondary | ICD-10-CM | POA: Diagnosis not present

## 2021-06-20 DIAGNOSIS — Z7901 Long term (current) use of anticoagulants: Secondary | ICD-10-CM | POA: Insufficient documentation

## 2021-06-20 DIAGNOSIS — E039 Hypothyroidism, unspecified: Secondary | ICD-10-CM | POA: Insufficient documentation

## 2021-06-20 DIAGNOSIS — N183 Chronic kidney disease, stage 3 unspecified: Secondary | ICD-10-CM | POA: Diagnosis not present

## 2021-06-20 NOTE — Progress Notes (Signed)
Radiation Oncology         [phone]) [phone] ________________________________  Name: Christina Jacobs        MRN: [id number]  Date of Service: 06/20/2021 DOB: 1941/04/13  CC:Fry, Ishmael Holter, MD  [name], MD     REFERRING PHYSICIAN: [name], MD   DIAGNOSIS: The encounter diagnosis was Malignant neoplasm of upper-outer quadrant of left breast in female, estrogen receptor positive ([place]).   HISTORY OF PRESENT ILLNESS: Christina Jacobs is a 80 y.o. female originally seen in the multidisciplinary breast clinic for a new diagnosis of left breast cancer. The patient was noted to have a screening detected mass in the left breast at 12:00. By diagnostic imaging this measured 2.4 cm and her axilla showed a 1.3 cm node. 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 Her tumor was ER positive, PR negative, HER2 negative with a Ki 67 of 20%.   Since her last visit she had MammaPrint testing on her biopsy which was high risk.  She proceeded with neoadjuvant chemotherapy which began on 01/26/2021 and she completed this regimen on 03/09/2021.  She had outside films that are not available for review however was taken to the operating room on 05/18/2021 where she underwent left breast lumpectomy with sentinel lymph node biopsy.  Final pathology revealed residual invasive ductal carcinoma measuring 2.2 cm with associated intermediate grade DCIS calcifications and her margins were uninvolved.  2 lymph nodes were removed 1 of which contained metastatic disease.  She plans to begin adjuvant antiestrogen with Dr. Lindi Adie after the completion of radiotherapy and is seen today to review the rationale for external beam radiation to the left breast and regional lymph nodes.    PREVIOUS RADIATION THERAPY: No   PAST MEDICAL HISTORY:  Past Medical History:  Diagnosis Date   Adenomatous colon polyp    Anal fissure    Anxiety    Asthma    Dr. Halford Chessman    Atrial fibrillation [facility])    Bowel obstruction (HCC)    Bursitis    CKD (chronic kidney disease) stage 3, GFR 30-59 ml/min (HCC)    sees Dr. [name]    Complication of anesthesia    post op N/V   Diverticulosis    Dyspnea    After a meal   Dysrhythmia    Afib   GERD (gastroesophageal reflux disease)    Glaucoma    Hiatal hernia    Giant type IV hiatal hernia; sees Dr. [name] at [facility]   Hyperlipidemia    Hypertension    Hypothyroidism    Myasthenia gravis ([place]) 2013   followed at [facility] Dr Scheryl Marten, diplopia only   Osteoarthritis    see's Dr. [name]   Osteoporosis    last DEXA 08-03-11       PAST SURGICAL HISTORY: Past Surgical History:  Procedure Laterality Date   ABDOMINAL HYSTERECTOMY     APPENDECTOMY     BREAST LUMPECTOMY Bilateral     several, enign breast lumps removed   BREAST LUMPECTOMY WITH RADIOACTIVE SEED AND SENTINEL LYMPH NODE BIOPSY Left 05/18/2021   Procedure: LEFT BREAST LUMPECTOMY WITH RADIOACTIVE SEED AND LEFT AXILLARY SENTINEL LYMPH NODE BIOPSY;  Surgeon: [name], MD;  Location: [place];  Service: General;  Laterality: Left;   CARDIOVERSION N/A 12/31/2017   Procedure: CARDIOVERSION;  Surgeon: Josue Hector, MD;  Location: [place];  Service: Cardiovascular;  Laterality: N/A;   CARDIOVERSION  N/A 02/11/2018   Procedure: CARDIOVERSION;  Surgeon: Josue Hector, MD;  Location: [facility] ENDOSCOPY;  Service: Cardiovascular;  Laterality: N/A;   COLONOSCOPY  09/27/2010   per Dr. Sharlett Iles, benign polyp, no repeats needed    [place]  10/18/2010   Dr. Hassell Done attempted but could reduce this, the entire stomach is above the diaphragm   PORT-A-CATH REMOVAL N/A 05/18/2021   Procedure: REMOVAL PORT-A-CATH;  Surgeon: [name], MD;  Location: [place];  Service: General;  Laterality: N/A;   PORTACATH PLACEMENT Right 01/25/2021   Procedure: INSERTION PORT-A-CATH;  Surgeon: [name], MD;  Location:  [facility];  Service: General;  Laterality: Right;  START TIME OF 8:30 AM FOR 60 MINUTES ROOM 4 [place] IQ   RADIOACTIVE SEED GUIDED AXILLARY SENTINEL LYMPH NODE Left 05/18/2021   Procedure: RADIOACTIVE SEED GUIDED LEFT AXILLARY NODE EXCISION;  Surgeon: [name], MD;  Location: [place];  Service: General;  Laterality: Left;   THYROIDECTOMY, PARTIAL  1999   TONSILLECTOMY       FAMILY HISTORY:  Family History  Problem Relation Age of Onset   Breast cancer Maternal Grandmother    Diabetes Father    Hyperlipidemia Father    Hypertension Father    Cancer Father        origin unknown   Kidney cancer Mother    Clotting disorder Brother    Cancer Sister        2015, anal ca   Colon cancer Son 40     SOCIAL HISTORY:  reports that she has never smoked. She has never used smokeless tobacco. She reports that she does not drink alcohol and does not use drugs. The patient is married and lives in [place]. She's accompanied by her husband.   ALLERGIES: Codeine, Eliquis [apixaban], Propoxyphene hcl, Meperidine hcl, and Tape   MEDICATIONS:  Current Outpatient Medications  Medication Sig Dispense Refill   albuterol (VENTOLIN HFA) 108 (90 Base) MCG/ACT inhaler Inhale 2 puffs into the lungs every 4 (four) hours as needed for wheezing or shortness of breath. 18 g 5   atorvastatin (LIPITOR) 10 MG tablet TAKE 1 TABLET BY MOUTH EVERY DAY 90 tablet 3   flecainide (TAMBOCOR) 50 MG tablet TAKE 1 TABLET BY MOUTH TWICE A DAY 180 tablet 2   latanoprost (XALATAN) 0.005 % ophthalmic solution Place 1 drop into both eyes at bedtime.     levothyroxine (SYNTHROID) 100 MCG tablet TAKE 1 TABLET BY MOUTH EVERY DAY 90 tablet 3   metoprolol succinate (TOPROL-XL) 50 MG 24 hr tablet TAKE 100 MG IN THE MORNING AND TAKE 50 MG IN THE EVENING. TAKE WITH OR IMMEDIATELY FOLLOWING A MEAL. 270 tablet 3   Multiple Vitamin (MULTIVITAMIN WITH MINERALS) TABS tablet Take 1 tablet by mouth in the morning. Centrum for Women      omeprazole (PRILOSEC) 40 MG capsule TAKE 1 CAPSULE (40 MG TOTAL) BY MOUTH DAILY AS NEEDED (REFLUX). 90 capsule 3   predniSONE (DELTASONE) 1 MG tablet Take 4 tablets (4 mg total) by mouth daily with breakfast.     Rivaroxaban (XARELTO) 15 MG TABS tablet TAKE 1 TABLET (15 MG TOTAL) BY MOUTH DAILY WITH SUPPER. 90 tablet 1   timolol (TIMOPTIC) 0.5 % ophthalmic solution Place 1 drop into both eyes in the morning and at bedtime.     traMADol (ULTRAM) 50 MG tablet Take 2 tablets (100 mg total) by mouth every 6 (six) hours as needed. 10 tablet 0   triamcinolone  cream (KENALOG) 0.1 % APPLY TO AFFECTED AREA TWICE A DAY 90 g 0   triamterene-hydrochlorothiazide (DYAZIDE) 37.5-25 MG capsule Take 1 each (1 capsule total) by mouth daily. 90 capsule 3   No current facility-administered medications for this encounter.     REVIEW OF SYSTEMS: On review of systems, the patient reports that she is doing well overall. She is feeling great. She's hopeful for her hair to grow back soon. She reports she's not had difficulty with healing or pain in her breast. No other complaints are verbalized.      PHYSICAL EXAM:  Wt Readings from Last 3 Encounters:  05/26/21 154 lb 3.2 oz (69.9 kg)  05/23/21 154 lb 12.8 oz (70.2 kg)  05/18/21 152 lb (68.9 kg)   Temp Readings from Last 3 Encounters:  05/26/21 97.6 F (36.4 C) (Temporal)  05/18/21 (!) 97.1 F (36.2 C)  05/11/21 98.2 F (36.8 C)   BP Readings from Last 3 Encounters:  05/26/21 130/61  05/23/21 128/80  05/18/21 (!) 121/53   Pulse Readings from Last 3 Encounters:  05/26/21 70  05/23/21 74  05/18/21 (!) 56    In general this is a well appearing caucasian female in no acute distress. She's alert and oriented x4 and appropriate throughout the examination. Cardiopulmonary assessment is negative for acute distress and she exhibits normal effort.  Her left breast is evaluated as well as her axilla, and well healed incisions are noted; no erythema separation  or bleeding is noted.   ECOG = 1  0 - Asymptomatic (Fully active, able to carry on all predisease activities without restriction)  1 - Symptomatic but completely ambulatory (Restricted in physically strenuous activity but ambulatory and able to carry out work of a light or sedentary nature. For example, light housework, office work)  2 - Symptomatic, <50% in bed during the day (Ambulatory and capable of all self care but unable to carry out any work activities. Up and about more than 50% of waking hours)  3 - Symptomatic, >50% in bed, but not bedbound (Capable of only limited self-care, confined to bed or chair 50% or more of waking hours)  4 - Bedbound (Completely disabled. Cannot carry on any self-care. Totally confined to bed or chair)  5 - Death   Eustace Pen MM, Creech RH, Tormey DC, et al. [id number]). "Toxicity and response criteria of the [facility]". [place] Oncol. 5 (6): 649-55    LABORATORY DATA:  Lab Results  Component Value Date   WBC 2.9 (L) 05/11/2021   HGB 11.7 (L) 05/11/2021   HCT 36.5 05/11/2021   MCV 100.0 05/11/2021   PLT 83 (L) 05/11/2021   Lab Results  Component Value Date   NA 142 05/23/2021   K 3.7 05/23/2021   CL 104 05/23/2021   CO2 26 05/23/2021   Lab Results  Component Value Date   ALT 13 03/30/2021   AST 17 03/30/2021   ALKPHOS 53 03/30/2021   BILITOT 1.2 03/30/2021      RADIOGRAPHY: No results found.     IMPRESSION/PLAN: 1. 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 Dr. Lisbeth Renshaw discusses  the nature of left, node positive breast disease.  She has completed systemic chemotherapy and has been doing well since surgery.  Dr. Lisbeth Renshaw reviews her final results of pathology and would recommend external radiotherapy to the breast and regional lymph nodes to reduce risks of local recurrence followed by antiestrogen therapy. We discussed  the risks, benefits, short, and long term effects of  radiotherapy, as well as the curative intent, and the patient is interested in proceeding. Dr. Lisbeth Renshaw discusses the delivery and logistics of radiotherapy and anticipates a course of 6 1/2 weeks of radiotherapy to the left breast and regional lymph nodes. Written consent is obtained and placed in the chart, a copy was provided to the patient. She will simulate today.     In a visit lasting 45 minutes, greater than 50% of the time was spent face to face reviewing her case, as well as in preparation of, discussing, and coordinating the patient's care.  The above documentation reflects my direct findings during this shared patient visit. Please see the separate note by Dr. Lisbeth Renshaw on this date for the remainder of the patient's plan of care.    Carola Rhine, [facility]    **Disclaimer: This note was dictated with voice recognition software. Similar sounding words can inadvertently be transcribed and this note may contain transcription errors which may not have been corrected upon publication of note.**

## 2021-06-20 NOTE — Progress Notes (Signed)
Patient reports no pain, fatigue, skin changes, swelling, tenderness. Patient is having some limited range of motion (LT arm), but physical therapy is helping with that.   Patient received breast consent.

## 2021-06-22 ENCOUNTER — Encounter: Payer: Self-pay | Admitting: Physical Therapy

## 2021-06-22 ENCOUNTER — Ambulatory Visit: Payer: Medicare HMO | Admitting: Physical Therapy

## 2021-06-22 ENCOUNTER — Other Ambulatory Visit: Payer: Self-pay

## 2021-06-22 DIAGNOSIS — R293 Abnormal posture: Secondary | ICD-10-CM

## 2021-06-22 DIAGNOSIS — C50412 Malignant neoplasm of upper-outer quadrant of left female breast: Secondary | ICD-10-CM

## 2021-06-22 DIAGNOSIS — Z170 Estrogen receptor positive status [ER+]: Secondary | ICD-10-CM

## 2021-06-22 DIAGNOSIS — M25612 Stiffness of left shoulder, not elsewhere classified: Secondary | ICD-10-CM

## 2021-06-22 DIAGNOSIS — Z483 Aftercare following surgery for neoplasm: Secondary | ICD-10-CM

## 2021-06-22 DIAGNOSIS — M25512 Pain in left shoulder: Secondary | ICD-10-CM

## 2021-06-22 NOTE — Therapy (Signed)
[place], [place], [address] Phone: [phone]   Fax:  [phone]  Physical Therapy Treatment  Patient Details  Name: Christina Jacobs MRN: [id number] Date of Birth: [birth year] Referring Provider (PT): Dr. [name]   Encounter Date: 06/22/2021   PT End of Session - 06/22/21 1200     Visit Number 2    Number of Visits 10    Date for PT Re-Evaluation 07/17/21    PT Start Time 1058    PT Stop Time 1155    PT Time Calculation (min) 57 min    Activity Tolerance Patient tolerated treatment well    Behavior During Therapy [facility] for tasks assessed/performed             Past Medical History:  Diagnosis Date   Adenomatous colon polyp    Anal fissure    Anxiety    Asthma    Dr. Halford Chessman   Atrial fibrillation [facility])    Bowel obstruction ([place])    Bursitis    CKD (chronic kidney disease) stage 3, GFR 30-59 ml/min ([place])    sees Dr. [name]    Complication of anesthesia    post op N/V   Diverticulosis    Dyspnea    After a meal   Dysrhythmia    Afib   GERD (gastroesophageal reflux disease)    Glaucoma    Hiatal hernia    Giant type IV hiatal hernia; sees Dr. [name] at [facility]   Hyperlipidemia    Hypertension    Hypothyroidism    Myasthenia gravis ([place]) 2013   followed at [facility] Dr Scheryl Marten, diplopia only   Osteoarthritis    see's Dr. [name]   Osteoporosis    last DEXA 08-03-11    Past Surgical History:  Procedure Laterality Date   ABDOMINAL HYSTERECTOMY     APPENDECTOMY     BREAST LUMPECTOMY Bilateral     several, enign breast lumps removed   BREAST LUMPECTOMY WITH RADIOACTIVE SEED AND SENTINEL LYMPH NODE BIOPSY Left 05/18/2021   Procedure: LEFT BREAST LUMPECTOMY WITH RADIOACTIVE SEED AND LEFT AXILLARY SENTINEL LYMPH NODE BIOPSY;  Surgeon: [name], MD;  Location: [place];  Service: General;  Laterality: Left;   CARDIOVERSION N/A 12/31/2017    Procedure: CARDIOVERSION;  Surgeon: Josue Hector, MD;  Location: [place];  Service: Cardiovascular;  Laterality: N/A;   CARDIOVERSION N/A 02/11/2018   Procedure: CARDIOVERSION;  Surgeon: Josue Hector, MD;  Location: [facility] ENDOSCOPY;  Service: Cardiovascular;  Laterality: N/A;   COLONOSCOPY  09/27/2010   per Dr. Sharlett Iles, benign polyp, no repeats needed    [place]  10/18/2010   Dr. Hassell Done attempted but could reduce this, the entire stomach is above the diaphragm   PORT-A-CATH REMOVAL N/A 05/18/2021   Procedure: REMOVAL PORT-A-CATH;  Surgeon: [name], MD;  Location: [place];  Service: General;  Laterality: N/A;   PORTACATH PLACEMENT Right 01/25/2021   Procedure: INSERTION PORT-A-CATH;  Surgeon: [name], MD;  [facility];  Service: General;  Laterality: Right;  START TIME OF 8:30 AM FOR 60 MINUTES ROOM 4 [place] IQ   RADIOACTIVE SEED GUIDED AXILLARY SENTINEL LYMPH NODE Left 05/18/2021   Procedure: RADIOACTIVE SEED GUIDED LEFT AXILLARY NODE EXCISION;  Surgeon: [name], MD;  Location: [place];  Service: General;  Laterality: Left;   THYROIDECTOMY, PARTIAL  1999   TONSILLECTOMY      There were no vitals  filed for this visit.   Subjective Assessment - 06/22/21 1100     Subjective I am having pain in my left armpit. My scars are pretty much healed over.    Pertinent History Patient was diagnosed on 12/06/2020 with left grade III invasive ductal carcinoma breast cancer. She underwent neoadjuvant chemotherapy from 01/26/2021-03/11/2021 followed by a left lumpectomy and sentinel node biopsy on 05/18/2021 with 1 of 2 lymph nodes being positive for cancer. It is ER positive, PR negative, and HER2 negative with a Ki67 of 20%. She has a hiatal hernia which is severe and limits her ability to exercise.    Patient Stated Goals Get my arm doing better    Currently in Pain? Yes    Pain Location Axilla    Pain Orientation Left    Pain Descriptors /  Indicators Burning    Pain Type Acute pain;Surgical pain    Pain Onset More than a month ago    Pain Frequency Intermittent    Aggravating Factors  raising arm    Pain Relieving Factors putting my arm down    Effect of Pain on Daily Activities no effect                               [facility] Adult PT Treatment/Exercise - 06/22/21 0001       Manual Therapy   Manual Therapy Myofascial release;Passive ROM    Myofascial Release to cording in L axilla extending down L upper arm    Passive ROM to R shoulder in to flexion, abduction and ER while pinning cords at axilla and moving shoulder in to various positions                         PT Long Term Goals - 06/19/21 1404       PT LONG TERM GOAL #1   Title Patient will demonstrate she has regained full shoulder ROM and function post operatively compared to baselines.    Time 4    Period Weeks    Status On-going    Target Date 07/17/21      PT LONG TERM GOAL #2   Title Patient will increase left shoulder active flexion ROM to >/= 130 degrees to reach overhead without pain.    Time 4    Period Weeks    Status New    Target Date 07/17/21      PT LONG TERM GOAL #3   Title Patient will increase left shoulder active abduction ROM to >/= 130 degrees to lay in needed position for radiation without pain.    Time 4    Period Weeks    Status New    Target Date 07/17/21      PT LONG TERM GOAL #4   Title Patient will report >/= 40% reduction in left axillary pain while reaching overhead.    Time 4    Period Weeks    Status New    Target Date 07/17/21      PT LONG TERM GOAL #5   Title Patient will verbalize good understanding of lymphedema risk reduction practices.    Time 4    Period Weeks    Status New    Target Date 07/17/21                   Plan - 06/22/21 1201     Clinical Impression Statement Began myofascial  release to cording in L axilla and conitnued with PROM to L shoulder. Pt  has numerous cords palpable in her L axilla and upper arm. Some cords are thick and others are thin. No releases noted today but pt did have improved ROM at end of session with less discomfort. Encouraged pt to continue stretching at home.    PT Frequency 2x / week    PT Duration 4 weeks    PT Treatment/Interventions ADLs/Self Care Home Management;Therapeutic exercise;Patient/family education;Manual techniques;Manual lymph drainage;Scar mobilization;Passive range of motion    PT Next Visit Plan PROM left shoulder; myofascial release left axilla and scar massage    PT Home Exercise Plan Post op shoulder ROM HEP    Consulted and Agree with Plan of Care Patient             Patient will benefit from skilled therapeutic intervention in order to improve the following deficits and impairments:  Postural dysfunction, Decreased range of motion, Impaired UE functional use, Pain, Decreased knowledge of precautions, Increased fascial restricitons, Increased edema, Decreased scar mobility  Visit Diagnosis: Stiffness of left shoulder, not elsewhere classified  Acute pain of left shoulder  Aftercare following surgery for neoplasm  Abnormal posture  Malignant neoplasm of upper-outer quadrant of left breast in female, estrogen receptor positive ([place])     Problem List Patient Active Problem List   Diagnosis Date Noted   Port-A-Cath in place 01/26/2021   Malignant neoplasm of upper-outer quadrant of left breast in female, estrogen receptor positive ([place]) 01/02/2021   Paroxysmal atrial fibrillation ([place]) 08/19/2019   CKD (chronic kidney disease) stage 3, GFR 30-59 ml/min ([place]) 08/09/2017   Hiatal hernia 08/09/2017   Glaucoma 08/08/2016   Restrictive lung disease 09/29/2013   Pulmonary nodule, right 07/14/2012   Myasthenia gravis ([place]) 07/14/2012   HERNIA 07/17/2010   DIVERTICULOSIS, COLON 07/14/2010   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 level reflux 07/14/2010    EXTERNAL HEMORRHOIDS 07/13/2009   GERD 02/16/2009   Osteoarthrosis, unspecified whether generalized or localized, unspecified site 07/01/2008   MITRAL VALVE PROLAPSE 11/17/2007   Hypothyroidism 07/16/2007   Dyslipidemia 07/16/2007   Essential hypertension 07/16/2007   Osteoporosis 07/16/2007    [facility] 06/22/2021, 12:06 PM  [place], [place], [address] Phone: [phone]   Fax:  [phone]  Name: Christina Jacobs MRN: [id number] Date of Birth: 1941/03/10   Manus Gunning, PT 06/22/21 12:06 PM

## 2021-06-27 ENCOUNTER — Ambulatory Visit
Admission: RE | Admit: 2021-06-27 | Discharge: 2021-06-27 | Disposition: A | Discharge status: Home / Self Care | Payer: Medicare HMO | Source: Ambulatory Visit | Attending: Radiation Oncology | Admitting: Radiation Oncology

## 2021-06-27 ENCOUNTER — Ambulatory Visit: Payer: Medicare HMO | Admitting: Physical Therapy

## 2021-06-27 ENCOUNTER — Encounter: Payer: Self-pay | Admitting: Physical Therapy

## 2021-06-27 ENCOUNTER — Other Ambulatory Visit: Payer: Self-pay

## 2021-06-27 ENCOUNTER — Encounter: Payer: Self-pay | Admitting: *Deleted

## 2021-06-27 DIAGNOSIS — C50412 Malignant neoplasm of upper-outer quadrant of left female breast: Secondary | ICD-10-CM

## 2021-06-27 DIAGNOSIS — Z483 Aftercare following surgery for neoplasm: Secondary | ICD-10-CM

## 2021-06-27 DIAGNOSIS — R293 Abnormal posture: Secondary | ICD-10-CM

## 2021-06-27 DIAGNOSIS — Z510 Encounter for antineoplastic radiation therapy: Secondary | ICD-10-CM | POA: Diagnosis not present

## 2021-06-27 DIAGNOSIS — M25512 Pain in left shoulder: Secondary | ICD-10-CM

## 2021-06-27 DIAGNOSIS — M25612 Stiffness of left shoulder, not elsewhere classified: Secondary | ICD-10-CM

## 2021-06-27 NOTE — Therapy (Signed)
[place], [place], [address] Phone: [phone]   Fax:  [phone]  Physical Therapy Treatment  Patient Details  Name: Christina Jacobs MRN: [id number] Date of Birth: [birth year] Referring Provider (PT): Dr. [name]   Encounter Date: 06/27/2021   PT End of Session - 06/27/21 1350     Visit Number 3    Number of Visits 10    Date for PT Re-Evaluation 07/17/21    PT Start Time 0623    PT Stop Time 1348    PT Time Calculation (min) 43 min    Activity Tolerance Patient tolerated treatment well    Behavior During Therapy [facility] for tasks assessed/performed             Past Medical History:  Diagnosis Date   Adenomatous colon polyp    Anal fissure    Anxiety    Asthma    Dr. Halford Chessman   Atrial fibrillation [facility])    Bowel obstruction ([place])    Bursitis    CKD (chronic kidney disease) stage 3, GFR 30-59 ml/min ([place])    sees Dr. [name]    Complication of anesthesia    post op N/V   Diverticulosis    Dyspnea    After a meal   Dysrhythmia    Afib   GERD (gastroesophageal reflux disease)    Glaucoma    Hiatal hernia    Giant type IV hiatal hernia; sees Dr. [name] at [facility]   Hyperlipidemia    Hypertension    Hypothyroidism    Myasthenia gravis ([place]) 2013   followed at [facility] Dr Scheryl Marten, diplopia only   Osteoarthritis    see's Dr. [name]   Osteoporosis    last DEXA 08-03-11    Past Surgical History:  Procedure Laterality Date   ABDOMINAL HYSTERECTOMY     APPENDECTOMY     BREAST LUMPECTOMY Bilateral     several, enign breast lumps removed   BREAST LUMPECTOMY WITH RADIOACTIVE SEED AND SENTINEL LYMPH NODE BIOPSY Left 05/18/2021   Procedure: LEFT BREAST LUMPECTOMY WITH RADIOACTIVE SEED AND LEFT AXILLARY SENTINEL LYMPH NODE BIOPSY;  Surgeon: [name], MD;  Location: [place];  Service: General;  Laterality: Left;   CARDIOVERSION N/A 12/31/2017    Procedure: CARDIOVERSION;  Surgeon: Josue Hector, MD;  Location: [place];  Service: Cardiovascular;  Laterality: N/A;   CARDIOVERSION N/A 02/11/2018   Procedure: CARDIOVERSION;  Surgeon: Josue Hector, MD;  Location: [facility] ENDOSCOPY;  Service: Cardiovascular;  Laterality: N/A;   COLONOSCOPY  09/27/2010   per Dr. Sharlett Iles, benign polyp, no repeats needed    [place]  10/18/2010   Dr. Hassell Done attempted but could reduce this, the entire stomach is above the diaphragm   PORT-A-CATH REMOVAL N/A 05/18/2021   Procedure: REMOVAL PORT-A-CATH;  Surgeon: [name], MD;  Location: [place];  Service: General;  Laterality: N/A;   PORTACATH PLACEMENT Right 01/25/2021   Procedure: INSERTION PORT-A-CATH;  Surgeon: [name], MD;  [facility];  Service: General;  Laterality: Right;  START TIME OF 8:30 AM FOR 60 MINUTES ROOM 4 [place] IQ   RADIOACTIVE SEED GUIDED AXILLARY SENTINEL LYMPH NODE Left 05/18/2021   Procedure: RADIOACTIVE SEED GUIDED LEFT AXILLARY NODE EXCISION;  Surgeon: [name], MD;  Location: [place];  Service: General;  Laterality: Left;   THYROIDECTOMY, PARTIAL  1999   TONSILLECTOMY      There were no vitals  filed for this visit.   Subjective Assessment - 06/27/21 1307     Subjective I am still having some pain it feels like it is bruised after last session. I feel like I have slightly less tightness than I did before treatment.    Pertinent History Patient was diagnosed on 12/06/2020 with left grade III invasive ductal carcinoma breast cancer. She underwent neoadjuvant chemotherapy from 01/26/2021-03/11/2021 followed by a left lumpectomy and sentinel node biopsy on 05/18/2021 with 1 of 2 lymph nodes being positive for cancer. It is ER positive, PR negative, and HER2 negative with a Ki67 of 20%. She has a hiatal hernia which is severe and limits her ability to exercise.    Patient Stated Goals Get my arm doing better    Currently in Pain?  No/denies    Pain Score 0-No pain                               [facility] Adult PT Treatment/Exercise - 06/27/21 0001       Manual Therapy   Myofascial Release to cording in L axilla extending down L upper arm - lump palpable in interior medial upper arm most likely from a cord that had been released    Passive ROM to R shoulder in to flexion and abduction while pinning cords at axilla and moving shoulder in to various positions                         PT Long Term Goals - 06/19/21 1404       PT LONG TERM GOAL #1   Title Patient will demonstrate she has regained full shoulder ROM and function post operatively compared to baselines.    Time 4    Period Weeks    Status On-going    Target Date 07/17/21      PT LONG TERM GOAL #2   Title Patient will increase left shoulder active flexion ROM to >/= 130 degrees to reach overhead without pain.    Time 4    Period Weeks    Status New    Target Date 07/17/21      PT LONG TERM GOAL #3   Title Patient will increase left shoulder active abduction ROM to >/= 130 degrees to lay in needed position for radiation without pain.    Time 4    Period Weeks    Status New    Target Date 07/17/21      PT LONG TERM GOAL #4   Title Patient will report >/= 40% reduction in left axillary pain while reaching overhead.    Time 4    Period Weeks    Status New    Target Date 07/17/21      PT LONG TERM GOAL #5   Title Patient will verbalize good understanding of lymphedema risk reduction practices.    Time 4    Period Weeks    Status New    Target Date 07/17/21                   Plan - 06/27/21 1351     Clinical Impression Statement Pt reports she had some discomfort in her L axilla and upper arm after last session stating it felt bruised. She does report slight improvement in ROM since last session. Continued with myofascial release to cording today. Pt has a lump palpable in inferior medial upper arm  that began after last session most likely due to released cording. Educated pt that if discomfort worens or area looks red to contact her doctor. Pt requested to hold sending pic and message to dr at this time since the area is becmong less sensitive.    PT Frequency 2x / week    PT Duration 4 weeks    PT Treatment/Interventions ADLs/Self Care Home Management;Therapeutic exercise;Patient/family education;Manual techniques;Manual lymph drainage;Scar mobilization;Passive range of motion    PT Next Visit Plan PROM left shoulder; myofascial release left axilla and scar massage    PT Home Exercise Plan Post op shoulder ROM HEP    Consulted and Agree with Plan of Care Patient             Patient will benefit from skilled therapeutic intervention in order to improve the following deficits and impairments:  Postural dysfunction, Decreased range of motion, Impaired UE functional use, Pain, Decreased knowledge of precautions, Increased fascial restricitons, Increased edema, Decreased scar mobility  Visit Diagnosis: Stiffness of left shoulder, not elsewhere classified  Acute pain of left shoulder  Aftercare following surgery for neoplasm  Abnormal posture  Malignant neoplasm of upper-outer quadrant of left breast in female, estrogen receptor positive ([place])     Problem List Patient Active Problem List   Diagnosis Date Noted   Port-A-Cath in place 01/26/2021   Malignant neoplasm of upper-outer quadrant of left breast in female, estrogen receptor positive ([place]) 01/02/2021   Paroxysmal atrial fibrillation ([place]) 08/19/2019   CKD (chronic kidney disease) stage 3, GFR 30-59 ml/min ([place]) 08/09/2017   Hiatal hernia 08/09/2017   Glaucoma 08/08/2016   Restrictive lung disease 09/29/2013   Pulmonary nodule, right 07/14/2012   Myasthenia gravis ([place]) 07/14/2012   HERNIA 07/17/2010   DIVERTICULOSIS, COLON 07/14/2010   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

## 2021-06-28 ENCOUNTER — Other Ambulatory Visit: Payer: Self-pay

## 2021-06-28 ENCOUNTER — Telehealth: Payer: Self-pay | Admitting: Hematology and Oncology

## 2021-06-28 ENCOUNTER — Ambulatory Visit
Admission: RE | Admit: 2021-06-28 | Discharge: 2021-06-28 | Disposition: A | Discharge status: Home / Self Care | Payer: Medicare HMO | Source: Ambulatory Visit | Attending: Radiation Oncology | Admitting: Radiation Oncology

## 2021-06-28 DIAGNOSIS — Z510 Encounter for antineoplastic radiation therapy: Secondary | ICD-10-CM | POA: Diagnosis not present

## 2021-06-28 NOTE — Telephone Encounter (Signed)
Scheduled appt per 8/23 sch msg. Pt aware.  

## 2021-06-29 ENCOUNTER — Ambulatory Visit: Payer: Medicare HMO

## 2021-06-29 ENCOUNTER — Ambulatory Visit
Admission: RE | Admit: 2021-06-29 | Discharge: 2021-06-29 | Disposition: A | Discharge status: Home / Self Care | Payer: Medicare HMO | Source: Ambulatory Visit | Attending: Radiation Oncology | Admitting: Radiation Oncology

## 2021-06-29 DIAGNOSIS — C50412 Malignant neoplasm of upper-outer quadrant of left female breast: Secondary | ICD-10-CM

## 2021-06-29 DIAGNOSIS — Z483 Aftercare following surgery for neoplasm: Secondary | ICD-10-CM

## 2021-06-29 DIAGNOSIS — M25612 Stiffness of left shoulder, not elsewhere classified: Secondary | ICD-10-CM

## 2021-06-29 DIAGNOSIS — M25512 Pain in left shoulder: Secondary | ICD-10-CM

## 2021-06-29 DIAGNOSIS — Z510 Encounter for antineoplastic radiation therapy: Secondary | ICD-10-CM | POA: Diagnosis not present

## 2021-06-29 DIAGNOSIS — R293 Abnormal posture: Secondary | ICD-10-CM

## 2021-06-29 NOTE — Therapy (Signed)
[place], [place], [address] Phone: [phone]   Fax:  [phone]  Physical Therapy Treatment  Patient Details  Name: Christina Jacobs MRN: [id number] Date of Birth: Sep 20, 1941 Referring Provider (PT): Dr. [name]   Encounter Date: 06/29/2021   PT End of Session - 06/29/21 1353     Visit Number 4    Number of Visits 10    Date for PT Re-Evaluation 07/17/21    PT Start Time 1304    PT Stop Time 1351    PT Time Calculation (min) 47 min    Activity Tolerance Patient tolerated treatment well    Behavior During Therapy [facility] for tasks assessed/performed             Past Medical History:  Diagnosis Date   Adenomatous colon polyp    Anal fissure    Anxiety    Asthma    Dr. Halford Chessman   Atrial fibrillation [facility])    Bowel obstruction ([place])    Bursitis    CKD (chronic kidney disease) stage 3, GFR 30-59 ml/min ([place])    sees Dr. [name]    Complication of anesthesia    post op N/V   Diverticulosis    Dyspnea    After a meal   Dysrhythmia    Afib   GERD (gastroesophageal reflux disease)    Glaucoma    Hiatal hernia    Giant type IV hiatal hernia; sees Dr. [name] at [facility]   Hyperlipidemia    Hypertension    Hypothyroidism    Myasthenia gravis ([place]) 2013   followed at [facility] Dr Scheryl Marten, diplopia only   Osteoarthritis    see's Dr. [name]   Osteoporosis    last DEXA 08-03-11    Past Surgical History:  Procedure Laterality Date   ABDOMINAL HYSTERECTOMY     APPENDECTOMY     BREAST LUMPECTOMY Bilateral     several, enign breast lumps removed   BREAST LUMPECTOMY WITH RADIOACTIVE SEED AND SENTINEL LYMPH NODE BIOPSY Left 05/18/2021   Procedure: LEFT BREAST LUMPECTOMY WITH RADIOACTIVE SEED AND LEFT AXILLARY SENTINEL LYMPH NODE BIOPSY;  Surgeon: [name], MD;  Location: [place];  Service: General;  Laterality: Left;   CARDIOVERSION N/A 12/31/2017    Procedure: CARDIOVERSION;  Surgeon: Josue Hector, MD;  Location: [place];  Service: Cardiovascular;  Laterality: N/A;   CARDIOVERSION N/A 02/11/2018   Procedure: CARDIOVERSION;  Surgeon: Josue Hector, MD;  Location: [facility] ENDOSCOPY;  Service: Cardiovascular;  Laterality: N/A;   COLONOSCOPY  09/27/2010   per Dr. Sharlett Iles, benign polyp, no repeats needed    [place]  10/18/2010   Dr. Hassell Done attempted but could reduce this, the entire stomach is above the diaphragm   PORT-A-CATH REMOVAL N/A 05/18/2021   Procedure: REMOVAL PORT-A-CATH;  Surgeon: [name], MD;  Location: [place];  Service: General;  Laterality: N/A;   PORTACATH PLACEMENT Right 01/25/2021   Procedure: INSERTION PORT-A-CATH;  Surgeon: [name], MD;  [facility];  Service: General;  Laterality: Right;  START TIME OF 8:30 AM FOR 60 MINUTES ROOM 4 [place] IQ   RADIOACTIVE SEED GUIDED AXILLARY SENTINEL LYMPH NODE Left 05/18/2021   Procedure: RADIOACTIVE SEED GUIDED LEFT AXILLARY NODE EXCISION;  Surgeon: [name], MD;  Location: [place];  Service: General;  Laterality: Left;   THYROIDECTOMY, PARTIAL  1999   TONSILLECTOMY      There were no vitals  filed for this visit.   Subjective Assessment - 06/29/21 1301     Subjective I am feeling better.  ROM is improving and it isn't too painful until I raise my arm.    Pertinent History Patient was diagnosed on 12/06/2020 with left grade III invasive ductal carcinoma breast cancer. She underwent neoadjuvant chemotherapy from 01/26/2021-03/11/2021 followed by a left lumpectomy and sentinel node biopsy on 05/18/2021 with 1 of 2 lymph nodes being positive for cancer. It is ER positive, PR negative, and HER2 negative with a Ki67 of 20%. She has a hiatal hernia which is severe and limits her ability to exercise.    Patient Stated Goals Get my arm doing better    Currently in Pain? No/denies    Pain Score 0-No pain                                [facility] Adult PT Treatment/Exercise - 06/29/21 0001       Shoulder Exercises: Pulleys   Flexion 2 minutes    Scaption 1 minute    ABduction 1 minute      Manual Therapy   Myofascial Release to cording in L axilla extending down L upper arm  and into forearm. lump palpable in interior medial upper arm most likely from a cord that had been released . Used surface area of fingers rather than thumbs secondary to fragile skin.   Passive ROM to R shoulder in to flexion and abduction while pinning cords at axilla and moving shoulder in to various positions                    PT Education - 06/29/21 1353     Education Details Pt did well with pulleys so educated about where she can purchase    Person(s) Educated Patient    Methods Explanation    Comprehension Verbalized understanding                 PT Long Term Goals - 06/19/21 1404       PT LONG TERM GOAL #1   Title Patient will demonstrate she has regained full shoulder ROM and function post operatively compared to baselines.    Time 4    Period Weeks    Status On-going    Target Date 07/17/21      PT LONG TERM GOAL #2   Title Patient will increase left shoulder active flexion ROM to >/= 130 degrees to reach overhead without pain.    Time 4    Period Weeks    Status New    Target Date 07/17/21      PT LONG TERM GOAL #3   Title Patient will increase left shoulder active abduction ROM to >/= 130 degrees to lay in needed position for radiation without pain.    Time 4    Period Weeks    Status New    Target Date 07/17/21      PT LONG TERM GOAL #4   Title Patient will report >/= 40% reduction in left axillary pain while reaching overhead.    Time 4    Period Weeks    Status New    Target Date 07/17/21      PT LONG TERM GOAL #5   Title Patient will verbalize good understanding of lymphedema risk reduction practices.    Time 4    Period Weeks    Status New  Target  Date 07/17/21                   Plan - 06/29/21 1353     Clinical Impression Statement Started pt with pulleys and she did very well overall with noted improvements in ROM.  Wrote down the name and she will consider purchasing.  Continued MFR techniques to axilla and arm with 2 pops noted around elbow.  Several very thin cords palpable just distal to elbow and released nicely.  Continued PROM with pt requiring VC's to relax. No real change in UE nodule    Stability/Clinical Decision Making Stable/Uncomplicated    Rehab Potential Excellent    PT Frequency 2x / week    PT Duration 4 weeks    PT Treatment/Interventions ADLs/Self Care Home Management;Therapeutic exercise;Patient/family education;Manual techniques;Manual lymph drainage;Scar mobilization;Passive range of motion    PT Next Visit Plan PROM left shoulder; myofascial release left axilla and scar massage    PT Home Exercise Plan Post op shoulder ROM HEP    Consulted and Agree with Plan of Care Patient             Patient will benefit from skilled therapeutic intervention in order to improve the following deficits and impairments:  Postural dysfunction, Decreased range of motion, Impaired UE functional use, Pain, Decreased knowledge of precautions, Increased fascial restricitons, Increased edema, Decreased scar mobility  Visit Diagnosis: Stiffness of left shoulder, not elsewhere classified  Acute pain of left shoulder  Aftercare following surgery for neoplasm  Abnormal posture  Malignant neoplasm of upper-outer quadrant of left breast in female, estrogen receptor positive ([place])     Problem List Patient Active Problem List   Diagnosis Date Noted   Port-A-Cath in place 01/26/2021   Malignant neoplasm of upper-outer quadrant of left breast in female, estrogen receptor positive ([place]) 01/02/2021   Paroxysmal atrial fibrillation ([place]) 08/19/2019   CKD (chronic kidney disease) stage 3, GFR 30-59 ml/min ([place])  08/09/2017   Hiatal hernia 08/09/2017   Glaucoma 08/08/2016   Restrictive lung disease 09/29/2013   Pulmonary nodule, right 07/14/2012   Myasthenia gravis ([place]) 07/14/2012   HERNIA 07/17/2010   DIVERTICULOSIS, COLON 07/14/2010   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 level reflux 07/14/2010   EXTERNAL HEMORRHOIDS 07/13/2009   GERD 02/16/2009   Osteoarthrosis, unspecified whether generalized or localized, unspecified site 07/01/2008   MITRAL VALVE PROLAPSE 11/17/2007   Hypothyroidism 07/16/2007   Dyslipidemia 07/16/2007   Essential hypertension 07/16/2007   Osteoporosis 07/16/2007    Claris Pong 06/29/2021, 1:57 PM  [facility] [address] [place], [place], [address] Phone: [phone]   Fax:  [phone]  Name: ARDYCE HEYER MRN: [id number] Date of Birth: 15-Aug-1941 Cheral Almas, PT 06/29/21 1:58 PM

## 2021-06-30 ENCOUNTER — Ambulatory Visit
Admission: RE | Admit: 2021-06-30 | Discharge: 2021-06-30 | Disposition: A | Discharge status: Home / Self Care | Payer: Medicare HMO | Source: Ambulatory Visit | Attending: Radiation Oncology | Admitting: Radiation Oncology

## 2021-06-30 ENCOUNTER — Other Ambulatory Visit: Payer: Self-pay

## 2021-06-30 DIAGNOSIS — C50412 Malignant neoplasm of upper-outer quadrant of left female breast: Secondary | ICD-10-CM

## 2021-06-30 DIAGNOSIS — Z170 Estrogen receptor positive status [ER+]: Secondary | ICD-10-CM

## 2021-06-30 DIAGNOSIS — Z510 Encounter for antineoplastic radiation therapy: Secondary | ICD-10-CM | POA: Diagnosis not present

## 2021-06-30 MED ORDER — ALRA NON-METALLIC DEODORANT (RAD-ONC)
1.0000 | Freq: Once | TOPICAL | Status: AC
Start: 2021-06-30 — End: 2021-06-30
  Administered 2021-06-30: 1 via TOPICAL

## 2021-06-30 MED ORDER — RADIAPLEXRX EX GEL
Freq: Once | CUTANEOUS | Status: AC
Start: 2021-06-30 — End: 2021-06-30

## 2021-06-30 NOTE — Progress Notes (Signed)
Pt here for patient teaching.  Pt given Radiation and You booklet, skin care instructions, [place]ra deodorant, and Radiaplex gel.  Reviewed areas of pertinence such as fatigue, hair loss, skin changes, breast tenderness, and breast swelling . Pt able to give teach back of to pat skin and use unscented/gentle soap,apply Radiaplex bid, avoid applying anything to skin within 4 hours of treatment, avoid wearing an under wire bra, and to use an electric razor if they must shave. Pt verbalizes understanding of information given and will contact nursing with any questions or concerns.     Http://rtanswers.org/treatmentinformation/whattoexpect/index  Phallon Haydu M. Nashla Althoff RN, BSN       

## 2021-07-03 ENCOUNTER — Ambulatory Visit
Admission: RE | Admit: 2021-07-03 | Discharge: 2021-07-03 | Disposition: A | Discharge status: Home / Self Care | Payer: Medicare HMO | Source: Ambulatory Visit | Attending: Radiation Oncology | Admitting: Radiation Oncology

## 2021-07-03 ENCOUNTER — Other Ambulatory Visit: Payer: Self-pay

## 2021-07-03 DIAGNOSIS — Z510 Encounter for antineoplastic radiation therapy: Secondary | ICD-10-CM | POA: Diagnosis not present

## 2021-07-04 ENCOUNTER — Other Ambulatory Visit: Payer: Self-pay

## 2021-07-04 ENCOUNTER — Ambulatory Visit
Admission: RE | Admit: 2021-07-04 | Discharge: 2021-07-04 | Disposition: A | Discharge status: Home / Self Care | Payer: Medicare HMO | Source: Ambulatory Visit | Attending: Radiation Oncology | Admitting: Radiation Oncology

## 2021-07-04 ENCOUNTER — Ambulatory Visit: Payer: Medicare HMO | Admitting: Rehabilitation

## 2021-07-04 ENCOUNTER — Encounter: Payer: Self-pay | Admitting: Rehabilitation

## 2021-07-04 DIAGNOSIS — Z483 Aftercare following surgery for neoplasm: Secondary | ICD-10-CM

## 2021-07-04 DIAGNOSIS — M25612 Stiffness of left shoulder, not elsewhere classified: Secondary | ICD-10-CM

## 2021-07-04 DIAGNOSIS — R293 Abnormal posture: Secondary | ICD-10-CM

## 2021-07-04 DIAGNOSIS — Z510 Encounter for antineoplastic radiation therapy: Secondary | ICD-10-CM | POA: Diagnosis not present

## 2021-07-04 DIAGNOSIS — M25512 Pain in left shoulder: Secondary | ICD-10-CM

## 2021-07-04 DIAGNOSIS — C50412 Malignant neoplasm of upper-outer quadrant of left female breast: Secondary | ICD-10-CM

## 2021-07-04 NOTE — Therapy (Signed)
[place], [place], [address] Phone: [phone]   Fax:  [phone]  Physical Therapy Treatment  Patient Details  Name: Christina Jacobs MRN: [id number] Date of Birth: 02-13-1941 Referring Provider (PT): Dr. [name]   Encounter Date: 07/04/2021   PT End of Session - 07/04/21 1346     Visit Number 5    Number of Visits 10    Date for PT Re-Evaluation 07/17/21    PT Start Time 1300    PT Stop Time 1345    PT Time Calculation (min) 45 min    Activity Tolerance Patient tolerated treatment well    Behavior During Therapy [facility] for tasks assessed/performed             Past Medical History:  Diagnosis Date   Adenomatous colon polyp    Anal fissure    Anxiety    Asthma    Dr. Halford Chessman   Atrial fibrillation [facility])    Bowel obstruction ([place])    Bursitis    CKD (chronic kidney disease) stage 3, GFR 30-59 ml/min ([place])    sees Dr. [name]    Complication of anesthesia    post op N/V   Diverticulosis    Dyspnea    After a meal   Dysrhythmia    Afib   GERD (gastroesophageal reflux disease)    Glaucoma    Hiatal hernia    Giant type IV hiatal hernia; sees Dr. [name] at [facility]   Hyperlipidemia    Hypertension    Hypothyroidism    Myasthenia gravis ([place]) 2013   followed at [facility] Dr Scheryl Marten, diplopia only   Osteoarthritis    see's Dr. [name]   Osteoporosis    last DEXA 08-03-11    Past Surgical History:  Procedure Laterality Date   ABDOMINAL HYSTERECTOMY     APPENDECTOMY     BREAST LUMPECTOMY Bilateral     several, enign breast lumps removed   BREAST LUMPECTOMY WITH RADIOACTIVE SEED AND SENTINEL LYMPH NODE BIOPSY Left 05/18/2021   Procedure: LEFT BREAST LUMPECTOMY WITH RADIOACTIVE SEED AND LEFT AXILLARY SENTINEL LYMPH NODE BIOPSY;  Surgeon: [name], MD;  Location: [place];  Service: General;  Laterality: Left;   CARDIOVERSION N/A 12/31/2017    Procedure: CARDIOVERSION;  Surgeon: Josue Hector, MD;  Location: [place];  Service: Cardiovascular;  Laterality: N/A;   CARDIOVERSION N/A 02/11/2018   Procedure: CARDIOVERSION;  Surgeon: Josue Hector, MD;  Location: [facility] ENDOSCOPY;  Service: Cardiovascular;  Laterality: N/A;   COLONOSCOPY  09/27/2010   per Dr. Sharlett Iles, benign polyp, no repeats needed    [place]  10/18/2010   Dr. Hassell Done attempted but could reduce this, the entire stomach is above the diaphragm   PORT-A-CATH REMOVAL N/A 05/18/2021   Procedure: REMOVAL PORT-A-CATH;  Surgeon: [name], MD;  Location: [place];  Service: General;  Laterality: N/A;   PORTACATH PLACEMENT Right 01/25/2021   Procedure: INSERTION PORT-A-CATH;  Surgeon: [name], MD;  Location: [facility];  Service: General;  Laterality: Right;  START TIME OF 8:30 AM FOR 60 MINUTES ROOM 4 [place]   RADIOACTIVE SEED GUIDED AXILLARY SENTINEL LYMPH NODE Left 05/18/2021   Procedure: RADIOACTIVE SEED GUIDED LEFT AXILLARY NODE EXCISION;  Surgeon: [name], MD;  Location: [place];  Service: General;  Laterality: Left;   THYROIDECTOMY, PARTIAL  1999   TONSILLECTOMY      There were no vitals  filed for this visit.   Subjective Assessment - 07/04/21 1256     Subjective Nothing new.  I can do everything I need to do    Pertinent History Patient was diagnosed on 12/06/2020 with left grade III invasive ductal carcinoma breast cancer. She underwent neoadjuvant chemotherapy from 01/26/2021-03/11/2021 followed by a left lumpectomy and sentinel node biopsy on 05/18/2021 with 1 of 2 lymph nodes being positive for cancer. It is ER positive, PR negative, and HER2 negative with a Ki67 of 20%. She has a hiatal hernia which is severe and limits her ability to exercise.    Currently in Pain? No/denies                [facility] PT Assessment - 07/04/21 0001       AROM   Left Shoulder Flexion 135 Degrees   pull in the axilla   Left  Shoulder ABduction 145 Degrees    Left Shoulder External Rotation 80 Degrees                           [facility] Adult PT Treatment/Exercise - 07/04/21 0001       Exercises   Exercises Other Exercises    Other Exercises  supine overhead flexion x 2, behind head x 2      Shoulder Exercises: Pulleys   Flexion 3 minutes    ABduction 3 minutes      Manual Therapy   Manual Therapy Edema management    Edema Management measured by for juzo soft 46mx for prophylactic orering if needed    Myofascial Release to cording in L axilla extending down L upper arm  and into forearm. lump palpable in interior medial upper arm most likely from a cord that had been released    Passive ROM to Lt shoulder in to flexion and abduction while pinning cords at axilla and moving shoulder in to various positions                         PT Long Term Goals - 07/04/21 1309       PT LONG TERM GOAL #1   Title Patient will demonstrate she has regained full shoulder ROM and function post operatively compared to baselines.    Baseline pull in the axilla from cording    Status Achieved      PT LONG TERM GOAL #2   Title Patient will increase left shoulder active flexion ROM to >/= 130 degrees to reach overhead without pain.    Status Achieved      PT LONG TERM GOAL #3   Title Patient will increase left shoulder active abduction ROM to >/= 130 degrees to lay in needed position for radiation without pain.    Status Achieved      PT LONG TERM GOAL #4   Title Patient will report >/= 40% reduction in left axillary pain while reaching overhead.    Status Achieved      PT LONG TERM GOAL #5   Title Patient will verbalize good understanding of lymphedema risk reduction practices.    Status Achieved                   Plan - 07/04/21 1346     Clinical Impression Statement pt has met all goals and has returned to baseline AROM but will pull in the left axilla due to cording.   Discussed POC and pt will  return 2-3 weeks after radiation to recheck ROM and progress strength as needed.    PT Next Visit Plan post radiation check for any more needs    Consulted and Agree with Plan of Care Patient             Patient will benefit from skilled therapeutic intervention in order to improve the following deficits and impairments:  Postural dysfunction, Decreased range of motion, Impaired UE functional use, Pain, Decreased knowledge of precautions, Increased fascial restricitons, Increased edema, Decreased scar mobility  Visit Diagnosis: Stiffness of left shoulder, not elsewhere classified  Acute pain of left shoulder  Aftercare following surgery for neoplasm  Abnormal posture  Malignant neoplasm of upper-outer quadrant of left breast in female, estrogen receptor positive ([place])     Problem List Patient Active Problem List   Diagnosis Date Noted   Port-A-Cath in place 01/26/2021   Malignant neoplasm of upper-outer quadrant of left breast in female, estrogen receptor positive ([place]) 01/02/2021   Paroxysmal atrial fibrillation ([place]) 08/19/2019   CKD (chronic kidney disease) stage 3, GFR 30-59 ml/min ([facility]) 08/09/2017   Hiatal hernia 08/09/2017   Glaucoma 08/08/2016   Restrictive lung disease 09/29/2013   Pulmonary nodule, right 07/14/2012   Myasthenia gravis ([place]) 07/14/2012   HERNIA 07/17/2010   DIVERTICULOSIS, COLON 07/14/2010   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 level reflux 07/14/2010   EXTERNAL HEMORRHOIDS 07/13/2009   GERD 02/16/2009   Osteoarthrosis, unspecified whether generalized or localized, unspecified site 07/01/2008   MITRAL VALVE PROLAPSE 11/17/2007   Hypothyroidism 07/16/2007   Dyslipidemia 07/16/2007   Essential hypertension 07/16/2007   Osteoporosis 07/16/2007    Christina Jacobs 07/04/2021, 1:49 PM  [place], [place],  [address] Phone: [phone]   Fax:  [phone]  Name: Christina Jacobs MRN: [id number] Date of Birth: 1940/11/10

## 2021-07-05 ENCOUNTER — Ambulatory Visit
Admission: RE | Admit: 2021-07-05 | Discharge: 2021-07-05 | Disposition: A | Discharge status: Home / Self Care | Payer: Medicare HMO | Source: Ambulatory Visit | Attending: Radiation Oncology | Admitting: Radiation Oncology

## 2021-07-05 DIAGNOSIS — Z510 Encounter for antineoplastic radiation therapy: Secondary | ICD-10-CM | POA: Diagnosis not present

## 2021-07-06 ENCOUNTER — Ambulatory Visit: Payer: Medicare HMO

## 2021-07-06 ENCOUNTER — Encounter: Payer: Self-pay | Admitting: Rehabilitation

## 2021-07-07 ENCOUNTER — Ambulatory Visit
Admission: RE | Admit: 2021-07-07 | Discharge: 2021-07-07 | Disposition: A | Discharge status: Home / Self Care | Payer: Medicare HMO | Source: Ambulatory Visit | Attending: Radiation Oncology | Admitting: Radiation Oncology

## 2021-07-07 ENCOUNTER — Other Ambulatory Visit: Payer: Self-pay

## 2021-07-07 DIAGNOSIS — C50412 Malignant neoplasm of upper-outer quadrant of left female breast: Secondary | ICD-10-CM | POA: Diagnosis present

## 2021-07-07 DIAGNOSIS — Z170 Estrogen receptor positive status [ER+]: Secondary | ICD-10-CM | POA: Diagnosis present

## 2021-07-11 ENCOUNTER — Other Ambulatory Visit: Payer: Self-pay

## 2021-07-11 ENCOUNTER — Ambulatory Visit
Admission: RE | Admit: 2021-07-11 | Discharge: 2021-07-11 | Disposition: A | Discharge status: Home / Self Care | Payer: Medicare HMO | Source: Ambulatory Visit | Attending: Radiation Oncology | Admitting: Radiation Oncology

## 2021-07-11 DIAGNOSIS — C50412 Malignant neoplasm of upper-outer quadrant of left female breast: Secondary | ICD-10-CM | POA: Diagnosis not present

## 2021-07-12 ENCOUNTER — Ambulatory Visit
Admission: RE | Admit: 2021-07-12 | Discharge: 2021-07-12 | Disposition: A | Discharge status: Home / Self Care | Payer: Medicare HMO | Source: Ambulatory Visit | Attending: Radiation Oncology | Admitting: Radiation Oncology

## 2021-07-12 DIAGNOSIS — C50412 Malignant neoplasm of upper-outer quadrant of left female breast: Secondary | ICD-10-CM | POA: Diagnosis not present

## 2021-07-13 ENCOUNTER — Other Ambulatory Visit: Payer: Self-pay

## 2021-07-13 ENCOUNTER — Ambulatory Visit
Admission: RE | Admit: 2021-07-13 | Discharge: 2021-07-13 | Disposition: A | Discharge status: Home / Self Care | Payer: Medicare HMO | Source: Ambulatory Visit | Attending: Radiation Oncology | Admitting: Radiation Oncology

## 2021-07-13 DIAGNOSIS — C50412 Malignant neoplasm of upper-outer quadrant of left female breast: Secondary | ICD-10-CM | POA: Diagnosis not present

## 2021-07-14 ENCOUNTER — Ambulatory Visit
Admission: RE | Admit: 2021-07-14 | Discharge: 2021-07-14 | Disposition: A | Discharge status: Home / Self Care | Payer: Medicare HMO | Source: Ambulatory Visit | Attending: Radiation Oncology | Admitting: Radiation Oncology

## 2021-07-14 ENCOUNTER — Other Ambulatory Visit: Payer: Self-pay

## 2021-07-14 DIAGNOSIS — C50412 Malignant neoplasm of upper-outer quadrant of left female breast: Secondary | ICD-10-CM | POA: Diagnosis not present

## 2021-07-15 ENCOUNTER — Other Ambulatory Visit: Payer: Self-pay | Admitting: Cardiovascular Disease

## 2021-07-17 ENCOUNTER — Ambulatory Visit
Admission: RE | Admit: 2021-07-17 | Discharge: 2021-07-17 | Disposition: A | Discharge status: Home / Self Care | Payer: Medicare HMO | Source: Ambulatory Visit | Attending: Radiation Oncology | Admitting: Radiation Oncology

## 2021-07-17 ENCOUNTER — Other Ambulatory Visit: Payer: Self-pay

## 2021-07-17 ENCOUNTER — Encounter: Payer: Self-pay | Admitting: *Deleted

## 2021-07-17 DIAGNOSIS — C50412 Malignant neoplasm of upper-outer quadrant of left female breast: Secondary | ICD-10-CM | POA: Diagnosis not present

## 2021-07-17 NOTE — Telephone Encounter (Signed)
Prescription refill request for Xarelto received.  Indication: Afib  Last office visit: 05/23/21 Glenford Peers, NP)  Weight: 69.9kg Age: 80 Scr: 1.42 (05/23/21) CrCl: 34.15m/min  Appropriate dose and refill sent to requested pharmacy.

## 2021-07-18 ENCOUNTER — Ambulatory Visit
Admission: RE | Admit: 2021-07-18 | Discharge: 2021-07-18 | Disposition: A | Discharge status: Home / Self Care | Payer: Medicare HMO | Source: Ambulatory Visit | Attending: Radiation Oncology | Admitting: Radiation Oncology

## 2021-07-18 ENCOUNTER — Other Ambulatory Visit: Payer: Self-pay

## 2021-07-18 DIAGNOSIS — C50412 Malignant neoplasm of upper-outer quadrant of left female breast: Secondary | ICD-10-CM | POA: Diagnosis not present

## 2021-07-19 ENCOUNTER — Ambulatory Visit
Admission: RE | Admit: 2021-07-19 | Discharge: 2021-07-19 | Disposition: A | Discharge status: Home / Self Care | Payer: Medicare HMO | Source: Ambulatory Visit | Attending: Radiation Oncology | Admitting: Radiation Oncology

## 2021-07-19 DIAGNOSIS — C50412 Malignant neoplasm of upper-outer quadrant of left female breast: Secondary | ICD-10-CM | POA: Diagnosis not present

## 2021-07-20 ENCOUNTER — Ambulatory Visit
Admission: RE | Admit: 2021-07-20 | Discharge: 2021-07-20 | Disposition: A | Discharge status: Home / Self Care | Payer: Medicare HMO | Source: Ambulatory Visit | Attending: Radiation Oncology | Admitting: Radiation Oncology

## 2021-07-20 ENCOUNTER — Other Ambulatory Visit: Payer: Self-pay

## 2021-07-20 DIAGNOSIS — C50412 Malignant neoplasm of upper-outer quadrant of left female breast: Secondary | ICD-10-CM | POA: Diagnosis not present

## 2021-07-21 ENCOUNTER — Ambulatory Visit
Admission: RE | Admit: 2021-07-21 | Discharge: 2021-07-21 | Disposition: A | Discharge status: Home / Self Care | Payer: Medicare HMO | Source: Ambulatory Visit | Attending: Radiation Oncology | Admitting: Radiation Oncology

## 2021-07-21 DIAGNOSIS — Z170 Estrogen receptor positive status [ER+]: Secondary | ICD-10-CM

## 2021-07-21 DIAGNOSIS — C50412 Malignant neoplasm of upper-outer quadrant of left female breast: Secondary | ICD-10-CM

## 2021-07-21 MED ORDER — RADIAPLEXRX EX GEL
Freq: Once | CUTANEOUS | Status: AC
Start: 2021-07-21 — End: 2021-07-21

## 2021-07-24 ENCOUNTER — Other Ambulatory Visit: Payer: Self-pay

## 2021-07-24 ENCOUNTER — Ambulatory Visit
Admission: RE | Admit: 2021-07-24 | Discharge: 2021-07-24 | Disposition: A | Discharge status: Home / Self Care | Payer: Medicare HMO | Source: Ambulatory Visit | Attending: Radiation Oncology | Admitting: Radiation Oncology

## 2021-07-24 DIAGNOSIS — C50412 Malignant neoplasm of upper-outer quadrant of left female breast: Secondary | ICD-10-CM | POA: Diagnosis not present

## 2021-07-25 ENCOUNTER — Ambulatory Visit
Admission: RE | Admit: 2021-07-25 | Discharge: 2021-07-25 | Disposition: A | Discharge status: Home / Self Care | Payer: Medicare HMO | Source: Ambulatory Visit | Attending: Radiation Oncology | Admitting: Radiation Oncology

## 2021-07-25 DIAGNOSIS — C50412 Malignant neoplasm of upper-outer quadrant of left female breast: Secondary | ICD-10-CM | POA: Diagnosis not present

## 2021-07-26 ENCOUNTER — Other Ambulatory Visit: Payer: Self-pay | Admitting: Family Medicine

## 2021-07-26 ENCOUNTER — Other Ambulatory Visit: Payer: Self-pay

## 2021-07-26 ENCOUNTER — Ambulatory Visit
Admission: RE | Admit: 2021-07-26 | Discharge: 2021-07-26 | Disposition: A | Discharge status: Home / Self Care | Payer: Medicare HMO | Source: Ambulatory Visit | Attending: Radiation Oncology | Admitting: Radiation Oncology

## 2021-07-26 DIAGNOSIS — C50412 Malignant neoplasm of upper-outer quadrant of left female breast: Secondary | ICD-10-CM | POA: Diagnosis not present

## 2021-07-27 ENCOUNTER — Ambulatory Visit
Admission: RE | Admit: 2021-07-27 | Discharge: 2021-07-27 | Disposition: A | Discharge status: Home / Self Care | Payer: Medicare HMO | Source: Ambulatory Visit | Attending: Radiation Oncology | Admitting: Radiation Oncology

## 2021-07-27 DIAGNOSIS — C50412 Malignant neoplasm of upper-outer quadrant of left female breast: Secondary | ICD-10-CM | POA: Diagnosis not present

## 2021-07-28 ENCOUNTER — Ambulatory Visit
Admission: RE | Admit: 2021-07-28 | Discharge: 2021-07-28 | Disposition: A | Discharge status: Home / Self Care | Payer: Medicare HMO | Source: Ambulatory Visit | Attending: Radiation Oncology | Admitting: Radiation Oncology

## 2021-07-28 ENCOUNTER — Other Ambulatory Visit: Payer: Self-pay

## 2021-07-28 DIAGNOSIS — C50412 Malignant neoplasm of upper-outer quadrant of left female breast: Secondary | ICD-10-CM | POA: Diagnosis not present

## 2021-07-31 ENCOUNTER — Other Ambulatory Visit: Payer: Self-pay

## 2021-07-31 ENCOUNTER — Ambulatory Visit
Admission: RE | Admit: 2021-07-31 | Discharge: 2021-07-31 | Disposition: A | Discharge status: Home / Self Care | Payer: Medicare HMO | Source: Ambulatory Visit | Attending: Radiation Oncology | Admitting: Radiation Oncology

## 2021-07-31 DIAGNOSIS — C50412 Malignant neoplasm of upper-outer quadrant of left female breast: Secondary | ICD-10-CM | POA: Diagnosis not present

## 2021-08-01 ENCOUNTER — Ambulatory Visit
Admission: RE | Admit: 2021-08-01 | Discharge: 2021-08-01 | Disposition: A | Discharge status: Home / Self Care | Payer: Medicare HMO | Source: Ambulatory Visit | Attending: Radiation Oncology | Admitting: Radiation Oncology

## 2021-08-01 ENCOUNTER — Other Ambulatory Visit: Payer: Self-pay

## 2021-08-01 DIAGNOSIS — C50412 Malignant neoplasm of upper-outer quadrant of left female breast: Secondary | ICD-10-CM | POA: Diagnosis not present

## 2021-08-02 ENCOUNTER — Ambulatory Visit
Admission: RE | Admit: 2021-08-02 | Discharge: 2021-08-02 | Disposition: A | Discharge status: Home / Self Care | Payer: Medicare HMO | Source: Ambulatory Visit | Attending: Radiation Oncology | Admitting: Radiation Oncology

## 2021-08-02 DIAGNOSIS — C50412 Malignant neoplasm of upper-outer quadrant of left female breast: Secondary | ICD-10-CM | POA: Diagnosis not present

## 2021-08-03 ENCOUNTER — Ambulatory Visit
Admission: RE | Admit: 2021-08-03 | Discharge: 2021-08-03 | Disposition: A | Discharge status: Home / Self Care | Payer: Medicare HMO | Source: Ambulatory Visit | Attending: Radiation Oncology | Admitting: Radiation Oncology

## 2021-08-03 ENCOUNTER — Other Ambulatory Visit: Payer: Self-pay

## 2021-08-03 DIAGNOSIS — C50412 Malignant neoplasm of upper-outer quadrant of left female breast: Secondary | ICD-10-CM | POA: Diagnosis not present

## 2021-08-04 ENCOUNTER — Ambulatory Visit
Admission: RE | Admit: 2021-08-04 | Discharge: 2021-08-04 | Disposition: A | Discharge status: Home / Self Care | Payer: Medicare HMO | Source: Ambulatory Visit | Attending: Radiation Oncology | Admitting: Radiation Oncology

## 2021-08-04 ENCOUNTER — Ambulatory Visit: Payer: Medicare HMO | Admitting: Radiation Oncology

## 2021-08-04 DIAGNOSIS — C50412 Malignant neoplasm of upper-outer quadrant of left female breast: Secondary | ICD-10-CM | POA: Diagnosis not present

## 2021-08-05 NOTE — Progress Notes (Signed)
Patient Care Team: Laurey Morale, MD as PCP - General [name], MD as PCP - Cardiology (Cardiology) Elsie Stain, MD (Pulmonary Disease) [name], MD as Consulting Physician (General Surgery) [name], MD as Consulting Physician (General Surgery) [name], MD as Consulting Physician (Hematology and Oncology) Kyung Rudd, MD as Consulting Physician (Radiation Oncology) Rockwell Germany, RN as Oncology Nurse Navigator Mauro Kaufmann, RN as Oncology Nurse Navigator  DIAGNOSIS:    ICD-10-CM   1. Malignant neoplasm of upper-outer quadrant of left breast in female, estrogen receptor positive ([place])  C50.412    Z17.0       SUMMARY OF ONCOLOGIC HISTORY: Oncology History  Malignant neoplasm of upper-outer quadrant of left breast in female, estrogen receptor positive ([place])  01/02/2021 Initial Diagnosis   Patient experienced chronic left breast tenderness for 2 years with a negative mammogram last year. Screening mammogram showed a 2.5cm central left breast mass and one enlarged lymph node. US showed a 2.6cm mass at the 12 o'clock position and a 1.3cm mass in the left axillary tail. 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, PR 0%, HER-2 equivocal by IHC, FISH negative, Ki-67 20%   01/04/2021 Cancer Staging   Staging form: Breast, AJCC 8th Edition - Clinical stage from 01/04/2021: Stage IIIA (cT2, cN1(f), cM0, G3, ER+, PR-, HER2-) - Signed by [name], MD on 01/04/2021 Stage prefix: Initial diagnosis Method of lymph node assessment: Core biopsy   01/26/2021 - 03/11/2021 Chemotherapy   4 cycles of Taxotere and Cytoxan neoadjuvant chemotherapy        05/18/2021 Surgery   Left lumpectomy with Dr. Donne Hazel: Residual IDC 2.2 cm, DCIS intermediate grade, margins negative, 1/2 lymph nodes positive ER 40% weak, PR negative, HER2 negative, Ki-67 20%     CHIEF COMPLIANT: Follow-up of left  breast cancer  INTERVAL HISTORY: LATICHA FERRUCCI is a 80 y.o. with above-mentioned history of left breast cancer having undergone chemotherapy. She presents to the clinic today for follow-up.  She is tolerating radiation fairly well without any major problems or concerns.  ALLERGIES:  is allergic to codeine, eliquis [apixaban], propoxyphene hcl, meperidine hcl, and tape.  MEDICATIONS:  Current Outpatient Medications  Medication Sig Dispense Refill   albuterol (VENTOLIN HFA) 108 (90 Base) MCG/ACT inhaler Inhale 2 puffs into the lungs every 4 (four) hours as needed for wheezing or shortness of breath. 18 g 5   atorvastatin (LIPITOR) 10 MG tablet TAKE 1 TABLET BY MOUTH EVERY DAY 90 tablet 3   flecainide (TAMBOCOR) 50 MG tablet TAKE 1 TABLET BY MOUTH TWICE A DAY 180 tablet 2   latanoprost (XALATAN) 0.005 % ophthalmic solution Place 1 drop into both eyes at bedtime.     levothyroxine (SYNTHROID) 100 MCG tablet TAKE 1 TABLET BY MOUTH EVERY DAY 90 tablet 3   metoprolol succinate (TOPROL-XL) 50 MG 24 hr tablet TAKE 100 MG IN THE MORNING AND TAKE 50 MG IN THE EVENING. TAKE WITH OR IMMEDIATELY FOLLOWING A MEAL. 270 tablet 3   Multiple Vitamin (MULTIVITAMIN WITH MINERALS) TABS tablet Take 1 tablet by mouth in the morning. Centrum for Women     omeprazole (PRILOSEC) 40 MG capsule TAKE 1 CAPSULE (40 MG TOTAL) BY MOUTH DAILY AS NEEDED (REFLUX). 90 capsule 3   predniSONE (DELTASONE) 1 MG tablet Take 4 tablets (4 mg total) by mouth daily with breakfast.     timolol (TIMOPTIC) 0.5 %  ophthalmic solution Place 1 drop into both eyes in the morning and at bedtime.     traMADol (ULTRAM) 50 MG tablet Take 2 tablets (100 mg total) by mouth every 6 (six) hours as needed. 10 tablet 0   triamcinolone cream (KENALOG) 0.1 % APPLY TO AFFECTED AREA TWICE A DAY 90 g 0   triamterene-hydrochlorothiazide (DYAZIDE) 37.5-25 MG capsule Take 1 each (1 capsule total) by mouth daily. 90 capsule 3   XARELTO 15 MG TABS tablet TAKE 1  TABLET (15 MG TOTAL) BY MOUTH DAILY WITH SUPPER. 90 tablet 1   No current facility-administered medications for this visit.    PHYSICAL EXAMINATION: ECOG PERFORMANCE STATUS: 1 - Symptomatic but completely ambulatory  Vitals:   08/07/21 1423  BP: (!) 159/67  Pulse: 76  Resp: 18  Temp: (!) 97.5 F (36.4 C)  SpO2: 95%   Filed Weights   08/07/21 1423  Weight: 152 lb 4.8 oz (69.1 kg)      LABORATORY DATA:  I have reviewed the data as listed CMP Latest Ref Rng & Units 05/23/2021 05/11/2021 03/30/2021  Glucose 65 - 99 mg/dL 92 102(H) 125(H)  BUN 8 - 27 mg/dL _0 Creatinine 0.57 - 1.00 mg/dL 1.42(H) 1.33(H) 1.33(H)  Sodium 134 - 144 mmol/L 142 137 140  Potassium 3.5 - 5.2 mmol/L 3.7 3.2(L) 3.3(L)  Chloride 96 - 106 mmol/L 104 105 105  CO2 20 - 29 mmol/L _1 Calcium 8.7 - 10.3 mg/dL 9.1 9.0 8.7(L)  Total Protein 6.5 - 8.1 g/dL - - 5.8(L)  Total Bilirubin 0.3 - 1.2 mg/dL - - 1.2  Alkaline Phos 38 - 126 U/L - - 53  AST 15 - 41 U/L - - 17  ALT 0 - 44 U/L - - 13    Lab Results  Component Value Date   WBC 2.9 (L) 05/11/2021   HGB 11.7 (L) 05/11/2021   HCT 36.5 05/11/2021   MCV 100.0 05/11/2021   PLT 83 (L) 05/11/2021   NEUTROABS 3.9 03/30/2021    ASSESSMENT & PLAN:  Malignant neoplasm of upper-outer quadrant of left breast in female, estrogen receptor positive ([place]) 01/02/2021: Screening mammogram detected left breast mass at 12 o'clock position 2.5 cm spiculated mass.  Abnormal left axillary lymph node 1.3 cm.  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  Neoadjuvant chemotherapy with 4 cycles of Taxotere and Cytoxan completed 03/30/2021 2. 05/18/2021: Left lumpectomy with Dr. Donne Hazel: Residual IDC 2.2 cm, DCIS intermediate grade, margins negative, 1/2 lymph nodes positive ER 40%  weak, PR negative, HER2 negative, Ki-67 20% 3.  Adjuvant radiation completing 08/14/21 4.  Follow-up adjuvant antiestrogen therapy with anastrozole 1 mg daily x7 years to start 08/19/21 ------------------------------------------------------------------------------------------------------------  Anastrozole counseling: We discussed the risks and benefits of anti-estrogen therapy with aromatase inhibitors. These include but not limited to insomnia, hot flashes, mood changes, vaginal dryness, bone density loss, and weight gain. We strongly believe that the benefits far outweigh the risks. Patient understands these risks and consented to starting treatment. Planned treatment duration is 7 years.  Bone density done in May 2022: Osteoporosis: T score -2.8: Advised her to start Fosamax weekly along with calcium and vitamin D Return to clinic in 3 months for SCP visit    No orders of the defined  types were placed in this encounter.  The patient has a good understanding of the overall plan. she agrees with it. she will call with any problems that may develop before the next visit here.  Total time spent: 30 mins including face to face time and time spent for planning, charting and coordination of care  Rulon Eisenmenger, MD, MPH 08/07/2021  I, Thana Ates, am acting as scribe for Dr. [name].  I have reviewed the above documentation for accuracy and completeness, and I agree with the above.

## 2021-08-07 ENCOUNTER — Ambulatory Visit: Payer: Medicare HMO

## 2021-08-07 ENCOUNTER — Other Ambulatory Visit: Payer: Self-pay

## 2021-08-07 ENCOUNTER — Inpatient Hospital Stay: Payer: Medicare HMO | Attending: Hematology and Oncology | Admitting: Hematology and Oncology

## 2021-08-07 DIAGNOSIS — M810 Age-related osteoporosis without current pathological fracture: Secondary | ICD-10-CM | POA: Diagnosis not present

## 2021-08-07 DIAGNOSIS — Z9221 Personal history of antineoplastic chemotherapy: Secondary | ICD-10-CM | POA: Insufficient documentation

## 2021-08-07 DIAGNOSIS — C50412 Malignant neoplasm of upper-outer quadrant of left female breast: Secondary | ICD-10-CM | POA: Diagnosis not present

## 2021-08-07 DIAGNOSIS — Z170 Estrogen receptor positive status [ER+]: Secondary | ICD-10-CM | POA: Diagnosis not present

## 2021-08-07 MED ORDER — ALENDRONATE SODIUM 70 MG PO TABS: 70.0000 mg | ORAL_TABLET | ORAL | 0 refills | Status: DC

## 2021-08-07 MED ORDER — LETROZOLE 2.5 MG PO TABS
2.5000 mg | ORAL_TABLET | Freq: Every day | ORAL | 3 refills | Status: DC
Start: 2021-08-24 — End: 2022-08-20

## 2021-08-07 NOTE — Assessment & Plan Note (Addendum)
01/02/2021: Screening mammogram detected left breast mass at 12 o'clock position 2.5 cm spiculated mass. Abnormal left axillary lymph node 1.3 cm. 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 Donne Hazel: Residual IDC 2.2 cm, DCIS intermediate grade, margins negative, 1/2 lymph nodes positive ER 40% weak, PR negative, HER2 negative, Ki-67 20% 3.Adjuvant radiation completing 08/14/21 4.Follow-up adjuvant antiestrogen therapy with anastrozole 1 mg daily x7 years to start 08/19/21 ------------------------------------------------------------------------------------------------------------ Anastrozole counseling: We discussed the risks and benefits of anti-estrogen therapy with aromatase inhibitors. These include but not limited to insomnia, hot flashes, mood changes, vaginal dryness, bone density loss, and weight gain. We strongly believe that the benefits far outweigh the risks. Patient understands these risks and consented to starting treatment. Planned treatment duration is 7 years.  Return to clinic in 3 months for SCP visit

## 2021-08-08 ENCOUNTER — Ambulatory Visit: Payer: Medicare HMO

## 2021-08-08 ENCOUNTER — Ambulatory Visit
Admission: RE | Admit: 2021-08-08 | Discharge: 2021-08-08 | Disposition: A | Discharge status: Home / Self Care | Payer: Medicare HMO | Source: Ambulatory Visit | Attending: Radiation Oncology | Admitting: Radiation Oncology

## 2021-08-08 DIAGNOSIS — C50412 Malignant neoplasm of upper-outer quadrant of left female breast: Secondary | ICD-10-CM | POA: Diagnosis not present

## 2021-08-08 DIAGNOSIS — Z170 Estrogen receptor positive status [ER+]: Secondary | ICD-10-CM | POA: Insufficient documentation

## 2021-08-09 ENCOUNTER — Ambulatory Visit
Admission: RE | Admit: 2021-08-09 | Discharge: 2021-08-09 | Disposition: A | Discharge status: Home / Self Care | Payer: Medicare HMO | Source: Ambulatory Visit | Attending: Radiation Oncology | Admitting: Radiation Oncology

## 2021-08-09 ENCOUNTER — Other Ambulatory Visit: Payer: Self-pay

## 2021-08-09 DIAGNOSIS — C50412 Malignant neoplasm of upper-outer quadrant of left female breast: Secondary | ICD-10-CM | POA: Diagnosis not present

## 2021-08-10 ENCOUNTER — Ambulatory Visit
Admission: RE | Admit: 2021-08-10 | Discharge: 2021-08-10 | Disposition: A | Discharge status: Home / Self Care | Payer: Medicare HMO | Source: Ambulatory Visit | Attending: Radiation Oncology | Admitting: Radiation Oncology

## 2021-08-10 DIAGNOSIS — C50412 Malignant neoplasm of upper-outer quadrant of left female breast: Secondary | ICD-10-CM | POA: Diagnosis not present

## 2021-08-11 ENCOUNTER — Ambulatory Visit
Admission: RE | Admit: 2021-08-11 | Discharge: 2021-08-11 | Disposition: A | Discharge status: Home / Self Care | Payer: Medicare HMO | Source: Ambulatory Visit | Attending: Radiation Oncology | Admitting: Radiation Oncology

## 2021-08-11 ENCOUNTER — Other Ambulatory Visit: Payer: Self-pay

## 2021-08-11 ENCOUNTER — Ambulatory Visit: Payer: Medicare HMO

## 2021-08-11 DIAGNOSIS — C50412 Malignant neoplasm of upper-outer quadrant of left female breast: Secondary | ICD-10-CM | POA: Diagnosis not present

## 2021-08-14 ENCOUNTER — Encounter: Payer: Self-pay | Admitting: *Deleted

## 2021-08-14 ENCOUNTER — Ambulatory Visit: Payer: Medicare HMO

## 2021-08-14 ENCOUNTER — Other Ambulatory Visit: Payer: Self-pay

## 2021-08-14 ENCOUNTER — Ambulatory Visit
Admission: RE | Admit: 2021-08-14 | Discharge: 2021-08-14 | Disposition: A | Discharge status: Home / Self Care | Payer: Medicare HMO | Source: Ambulatory Visit | Attending: Radiation Oncology | Admitting: Radiation Oncology

## 2021-08-14 DIAGNOSIS — C50412 Malignant neoplasm of upper-outer quadrant of left female breast: Secondary | ICD-10-CM

## 2021-08-15 ENCOUNTER — Encounter: Payer: Self-pay | Admitting: Hematology and Oncology

## 2021-08-15 ENCOUNTER — Ambulatory Visit
Admission: RE | Admit: 2021-08-15 | Discharge: 2021-08-15 | Disposition: A | Discharge status: Home / Self Care | Payer: Medicare HMO | Source: Ambulatory Visit | Attending: Radiation Oncology | Admitting: Radiation Oncology

## 2021-08-15 ENCOUNTER — Encounter: Payer: Self-pay | Admitting: Radiation Oncology

## 2021-08-15 DIAGNOSIS — C50412 Malignant neoplasm of upper-outer quadrant of left female breast: Secondary | ICD-10-CM | POA: Diagnosis not present

## 2021-08-21 ENCOUNTER — Encounter: Payer: Self-pay | Admitting: Hematology and Oncology

## 2021-08-21 NOTE — Progress Notes (Signed)
                                                                                                                                                             Patient Name: Christina Jacobs MRN: [id number] DOB: 1940/11/17 Referring Physician: Alysia Penna (Profile Not Attached) Date of Service: 08/15/2021 [facility], [place]                                                        End Of Treatment Note  Diagnoses: C50.412-Malignant neoplasm of upper-outer quadrant of left female breast  Cancer Staging: Stage IIIA, cT2N1M0 grade 3 ER positive invasive ductal carcinoma of the left breast  Intent: Curative  Radiation Treatment Dates: 06/27/2021 through 08/15/2021 Site Technique Total Dose (Gy) Dose per Fx (Gy) Completed Fx Beam Energies  Breast, Left: Breast_Lt 3D 50.4/50.4 1.8 28/28 6X, 10X  Breast, Left: Breast_Lt_SCLV 3D 50.4/50.4 1.8 28/28 6X, 10X  Breast, Left: Breast_Lt_Bst 3D 10/10 2 5/5 6X, 10X   Narrative: The patient tolerated radiation therapy relatively well. She developed anticipated skin changes in the treatment field but did not note fatigue at the conclusion of her treatment.  Plan: The patient will receive a call in about one month from the radiation oncology department. She will continue follow up with Dr. Lindi Adie as well.   ________________________________________________    Carola Rhine, [facility]

## 2021-08-28 ENCOUNTER — Other Ambulatory Visit: Payer: Self-pay

## 2021-08-28 ENCOUNTER — Ambulatory Visit: Payer: Medicare HMO | Attending: General Surgery | Admitting: Physical Therapy

## 2021-08-28 DIAGNOSIS — Z483 Aftercare following surgery for neoplasm: Secondary | ICD-10-CM | POA: Insufficient documentation

## 2021-08-28 NOTE — Therapy (Signed)
[facility] @ [place], [place], [address] Phone: [phone]   Fax:  [phone]  Physical Therapy Treatment  Patient Details  Name: Christina Jacobs MRN: [id number] Date of Birth: June 07, 1941 Referring Provider (PT): Dr. [name]   Encounter Date: 08/28/2021   PT End of Session - 08/28/21 1513     Visit Number 5    Number of Visits 10    PT Start Time 1010    PT Stop Time 1015    PT Time Calculation (min) 5 min    Activity Tolerance Patient tolerated treatment well    Behavior During Therapy [facility] for tasks assessed/performed             Past Medical History:  Diagnosis Date   Adenomatous colon polyp    Anal fissure    Anxiety    Asthma    Dr. Halford Chessman   Atrial fibrillation [facility])    Bowel obstruction ([place])    Bursitis    CKD (chronic kidney disease) stage 3, GFR 30-59 ml/min ([place])    sees Dr. [name]    Complication of anesthesia    post op N/V   Diverticulosis    Dyspnea    After a meal   Dysrhythmia    Afib   GERD (gastroesophageal reflux disease)    Glaucoma    Hiatal hernia    Giant type IV hiatal hernia; sees Dr. [name] at [facility]   Hyperlipidemia    Hypertension    Hypothyroidism    Myasthenia gravis ([place]) 2013   followed at [facility] Dr Scheryl Marten, diplopia only   Osteoarthritis    see's Dr. [name]   Osteoporosis    last DEXA 08-03-11    Past Surgical History:  Procedure Laterality Date   ABDOMINAL HYSTERECTOMY     APPENDECTOMY     BREAST LUMPECTOMY Bilateral     several, enign breast lumps removed   BREAST LUMPECTOMY WITH RADIOACTIVE SEED AND SENTINEL LYMPH NODE BIOPSY Left 05/18/2021   Procedure: LEFT BREAST LUMPECTOMY WITH RADIOACTIVE SEED AND LEFT AXILLARY SENTINEL LYMPH NODE BIOPSY;  Surgeon: [name], MD;  Location: [facility];  Service: General;  Laterality: Left;   CARDIOVERSION N/A 12/31/2017   Procedure: CARDIOVERSION;  Surgeon:  Josue Hector, MD;  Location: [place];  Service: Cardiovascular;  Laterality: N/A;   CARDIOVERSION N/A 02/11/2018   Procedure: CARDIOVERSION;  Surgeon: Josue Hector, MD;  Location: [facility] ENDOSCOPY;  Service: Cardiovascular;  Laterality: N/A;   COLONOSCOPY  09/27/2010   per Dr. Sharlett Iles, benign polyp, no repeats needed    [place]  10/18/2010   Dr. Hassell Done attempted but could reduce this, the entire stomach is above the diaphragm   PORT-A-CATH REMOVAL N/A 05/18/2021   Procedure: REMOVAL PORT-A-CATH;  Surgeon: [name], MD;  Location: [place];  Service: General;  Laterality: N/A;   PORTACATH PLACEMENT Right 01/25/2021   Procedure: INSERTION PORT-A-CATH;  Surgeon: [name], MD;  Location: [facility];  Service: General;  Laterality: Right;  START TIME OF 8:30 AM FOR 60 MINUTES ROOM 4 [place] [facility]   RADIOACTIVE SEED GUIDED AXILLARY SENTINEL LYMPH NODE Left 05/18/2021   Procedure: RADIOACTIVE SEED GUIDED LEFT AXILLARY NODE EXCISION;  Surgeon: [name], MD;  Location: [facility];  Service: General;  Laterality: Left;   THYROIDECTOMY, PARTIAL  1999   TONSILLECTOMY      There were no vitals filed for this visit.  Subjective Assessment - 08/28/21 1511     Subjective Patient is here for her 3 month SOZO screen.. No complaints.    Pertinent History Patient was diagnosed on 12/06/2020 with left grade III invasive ductal carcinoma breast cancer. She underwent neoadjuvant chemotherapy from 01/26/2021-03/11/2021 followed by a left lumpectomy and sentinel node biopsy on 05/18/2021 with 1 of 2 lymph nodes being positive for cancer. It is ER positive, PR negative, and HER2 negative with a Ki67 of 20%. She has a hiatal hernia which is severe and limits her ability to exercise.                    L-DEX FLOWSHEETS - 08/28/21 1500       L-DEX LYMPHEDEMA SCREENING   Measurement Type Unilateral    L-DEX MEASUREMENT EXTREMITY Upper Extremity    POSITION   Standing    DOMINANT SIDE Right    At Risk Side Left    BASELINE SCORE (UNILATERAL) -1    L-DEX SCORE (UNILATERAL) -1.2    VALUE CHANGE (UNILAT) -0.2                                     PT Long Term Goals - 07/04/21 1309       PT LONG TERM GOAL #1   Title Patient will demonstrate she has regained full shoulder ROM and function post operatively compared to baselines.    Baseline pull in the axilla from cording    Status Achieved      PT LONG TERM GOAL #2   Title Patient will increase left shoulder active flexion ROM to >/= 130 degrees to reach overhead without pain.    Status Achieved      PT LONG TERM GOAL #3   Title Patient will increase left shoulder active abduction ROM to >/= 130 degrees to lay in needed position for radiation without pain.    Status Achieved      PT LONG TERM GOAL #4   Title Patient will report >/= 40% reduction in left axillary pain while reaching overhead.    Status Achieved      PT LONG TERM GOAL #5   Title Patient will verbalize good understanding of lymphedema risk reduction practices.    Status Achieved                   Plan - 08/28/21 1513     Clinical Impression Statement Pt had minimal change on her SOZO screen so no sign of lymphedema.    PT Next Visit Plan Continue SOZO screens    Consulted and Agree with Plan of Care Patient             Patient will benefit from skilled therapeutic intervention in order to improve the following deficits and impairments:  Postural dysfunction, Decreased range of motion, Impaired UE functional use, Pain, Decreased knowledge of precautions, Increased fascial restricitons, Increased edema, Decreased scar mobility  Visit Diagnosis: Aftercare following surgery for neoplasm     Problem List Patient Active Problem List   Diagnosis Date Noted   Port-A-Cath in place 01/26/2021   Malignant neoplasm of upper-outer quadrant of left breast in female, estrogen receptor  positive ([place]) 01/02/2021   Paroxysmal atrial fibrillation ([place]) 08/19/2019   CKD (chronic kidney disease) stage 3, GFR 30-59 ml/min ([place]) 08/09/2017   Hiatal hernia 08/09/2017   Glaucoma 08/08/2016   Restrictive lung disease 09/29/2013  Pulmonary nodule, right 07/14/2012   Myasthenia gravis ([place]) 07/14/2012   HERNIA 07/17/2010   DIVERTICULOSIS, COLON 07/14/2010   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

## 2021-09-18 ENCOUNTER — Ambulatory Visit
Admission: RE | Admit: 2021-09-18 | Discharge: 2021-09-18 | Disposition: A | Discharge status: Home / Self Care | Payer: Medicare HMO | Source: Ambulatory Visit | Attending: Radiation Oncology | Admitting: Radiation Oncology

## 2021-09-18 DIAGNOSIS — C50412 Malignant neoplasm of upper-outer quadrant of left female breast: Secondary | ICD-10-CM

## 2021-09-18 DIAGNOSIS — Z170 Estrogen receptor positive status [ER+]: Secondary | ICD-10-CM

## 2021-09-18 NOTE — Progress Notes (Signed)
  Radiation Oncology         [phone] ________________________________  Name: Christina Jacobs MRN: [id number]  Date of Service: 09/18/2021  DOB: 28-May-1941  Post Treatment Telephone Note  Diagnosis:   Stage IIIA, cT2N1M0 grade 3 ER positive invasive ductal carcinoma of the left breast  Interval Since Last Radiation:  5 weeks   06/27/2021 through 08/15/2021 Site Technique Total Dose (Gy) Dose per Fx (Gy) Completed Fx Beam Energies  Breast, Left: Breast_Lt 3D 50.4/50.4 1.8 28/28 6X, 10X  Breast, Left: Breast_Lt_SCLV 3D 50.4/50.4 1.8 28/28 6X, 10X  Breast, Left: Breast_Lt_Bst 3D 10/10 2 5/5 6X, 10X   Narrative:  The patient was contacted today for routine follow-up. During treatment she did very well with radiotherapy and did not have significant desquamation. She reports she is doing very well and her skin continues to heal up nicely. She denies any fatigue.  Impression/Plan: 1. Stage IIIA, cT2N1M0 grade 3 ER positive invasive ductal carcinoma of the left breast. The patient has been doing well since completion of radiotherapy. We discussed that we would be happy to continue to follow her as needed, but she will also continue to follow up with Dr. Lindi Adie in medical oncology. She was counseled on skin care as well as measures to avoid sun exposure to this area.  2. Survivorship. We discussed the importance of survivorship evaluation and encouraged her to attend her upcoming visit with that clinic.       Carola Rhine, PAC

## 2021-09-30 ENCOUNTER — Encounter (HOSPITAL_BASED_OUTPATIENT_CLINIC_OR_DEPARTMENT_OTHER): Payer: Self-pay

## 2021-09-30 ENCOUNTER — Emergency Department (HOSPITAL_BASED_OUTPATIENT_CLINIC_OR_DEPARTMENT_OTHER)
Admission: EM | Admit: 2021-09-30 | Discharge: 2021-09-30 | Disposition: A | Discharge status: Home / Self Care | Payer: Medicare HMO | Attending: Emergency Medicine | Admitting: Emergency Medicine

## 2021-09-30 ENCOUNTER — Other Ambulatory Visit: Payer: Self-pay

## 2021-09-30 ENCOUNTER — Emergency Department (HOSPITAL_BASED_OUTPATIENT_CLINIC_OR_DEPARTMENT_OTHER): Payer: Medicare HMO

## 2021-09-30 DIAGNOSIS — I129 Hypertensive chronic kidney disease with stage 1 through stage 4 chronic kidney disease, or unspecified chronic kidney disease: Secondary | ICD-10-CM | POA: Insufficient documentation

## 2021-09-30 DIAGNOSIS — W109XXA Fall (on) (from) unspecified stairs and steps, initial encounter: Secondary | ICD-10-CM | POA: Diagnosis not present

## 2021-09-30 DIAGNOSIS — S0990XA Unspecified injury of head, initial encounter: Secondary | ICD-10-CM | POA: Diagnosis not present

## 2021-09-30 DIAGNOSIS — W19XXXA Unspecified fall, initial encounter: Secondary | ICD-10-CM

## 2021-09-30 DIAGNOSIS — E039 Hypothyroidism, unspecified: Secondary | ICD-10-CM | POA: Diagnosis not present

## 2021-09-30 DIAGNOSIS — Z79899 Other long term (current) drug therapy: Secondary | ICD-10-CM | POA: Diagnosis not present

## 2021-09-30 DIAGNOSIS — S0181XA Laceration without foreign body of other part of head, initial encounter: Secondary | ICD-10-CM | POA: Diagnosis not present

## 2021-09-30 DIAGNOSIS — Y92009 Unspecified place in unspecified non-institutional (private) residence as the place of occurrence of the external cause: Secondary | ICD-10-CM | POA: Insufficient documentation

## 2021-09-30 DIAGNOSIS — J45909 Unspecified asthma, uncomplicated: Secondary | ICD-10-CM | POA: Insufficient documentation

## 2021-09-30 DIAGNOSIS — Z7952 Long term (current) use of systemic steroids: Secondary | ICD-10-CM | POA: Insufficient documentation

## 2021-09-30 DIAGNOSIS — N183 Chronic kidney disease, stage 3 unspecified: Secondary | ICD-10-CM | POA: Diagnosis not present

## 2021-09-30 DIAGNOSIS — S0993XA Unspecified injury of face, initial encounter: Secondary | ICD-10-CM | POA: Diagnosis present

## 2021-09-30 MED ORDER — LIDOCAINE-EPINEPHRINE (PF) 2 %-1:200000 IJ SOLN
10.0000 mL | Freq: Once | INTRAMUSCULAR | Status: AC
  Administered 2021-09-30: 10 mL
  Filled 2021-09-30: qty 20

## 2021-09-30 NOTE — ED Notes (Signed)
Patient independent with ambulation and stable gait to room.  Received care of patient resting on stretcher.  0 s/s acute distress.  Bed in lowest position with side rails up x2.  Call bell in reach.   Bleeding to forehead laceration controlled at this time.

## 2021-09-30 NOTE — Discharge Instructions (Signed)
Please read and follow all provided instructions.  Your diagnoses today include:  1. Facial laceration, initial encounter   2. Injury of head, initial encounter   3. Fall in home, initial encounter     Tests performed today include: CT scan of your head that did not show any serious injury. Vital signs. See below for your results today.   Medications prescribed:  None  Take any prescribed medications only as directed.  Home care instructions:  Follow any educational materials contained in this packet.  BE VERY CAREFUL not to take multiple medicines containing Tylenol (also called acetaminophen). Doing so can lead to an overdose which can damage your liver and cause liver failure and possibly death.   Follow-up instructions: Please follow-up with your primary care provider in 5 to 7 days for wound recheck and suture removal.  Return instructions:  SEEK IMMEDIATE MEDICAL ATTENTION IF: There is confusion or drowsiness (although children frequently become drowsy after injury).  You cannot awaken the injured person.  You have more than one episode of vomiting.  You notice dizziness or unsteadiness which is getting worse, or inability to walk.  You have convulsions or unconsciousness.  You experience severe, persistent headaches not relieved by Tylenol. You cannot use arms or legs normally.  There are changes in pupil sizes. (This is the black center in the colored part of the eye)  There is clear or bloody discharge from the nose or ears.  You have change in speech, vision, swallowing, or understanding.  Localized weakness, numbness, tingling, or change in bowel or bladder control. You have any other emergent concerns.  Additional Information: You have had a head injury which does not appear to require admission at this time.  Your vital signs today were: BP (!) 156/103 (BP Location: Right Arm)   Pulse 87   Temp 98.2 F (36.8 C) (Oral)   Resp 18   Ht 5\' 4"  (1.626 m)   Wt  68.9 kg   SpO2 94%   BMI 26.09 kg/m  If your blood pressure (BP) was elevated above 135/85 this visit, please have this repeated by your doctor within one month. --------------

## 2021-09-30 NOTE — ED Notes (Signed)
Dry sterile nonadherent dressing applied to forehead.  Patient discharged to home.  All discharge instructions reviewed.  Patient verbalized understanding via teachback method.  VS WDL.  Respirations even and unlabored.  Ambulatory out of ED.

## 2021-09-30 NOTE — ED Triage Notes (Signed)
Patient was coming down her steps into her carport and missed a step around 1500. Patient hit face on car. Swelling hematoma to L eye. Lack above L eye. Bleeding controlled at this time. Pt takes xarelto.

## 2021-09-30 NOTE — ED Provider Notes (Signed)
[name] EMERGENCY DEPARTMENT Provider Note   CSN: [id number] Arrival date & time: 09/30/21  1947     History Chief Complaint  Patient presents with   Lytle Michaels    Christina Jacobs is a 80 y.o. female.  Patient with recent treatment of breast cancer, atrial fibrillation on anticoagulation -- presents the emergency department for injury sustained after a fall today.  Fall occurred about 3 PM.  Patient was going down 3 stairs into her garage when she fell.  She struck her left forehead on part of a car.  She has had mild oozing of blood from the wound as well as swelling about the orbit with ecchymosis.  Steri-Strips were applied, however she does have a more mildly gaping aspect of the wound prompting ED visit tonight.  No recent nausea, vomiting, or other illnesses reported.  She does not have a headache.  She has not been confused.  She denies neck pain, weakness in the arms or legs.  No vision changes.  No other treatments prior to arrival.      Past Medical History:  Diagnosis Date   Adenomatous colon polyp    Anal fissure    Anxiety    Asthma    Dr. Halford Chessman   Atrial fibrillation [facility])    Bowel obstruction ([place])    Bursitis    CKD (chronic kidney disease) stage 3, GFR 30-59 ml/min ([facility])    sees Dr. [name]    Complication of anesthesia    post op N/V   Diverticulosis    Dyspnea    After a meal   Dysrhythmia    Afib   GERD (gastroesophageal reflux disease)    Glaucoma    Hiatal hernia    Giant type IV hiatal hernia; sees Dr. [name] at [facility]   Hyperlipidemia    Hypertension    Hypothyroidism    Myasthenia gravis ([place]) 2013   followed at [facility] Dr Scheryl Marten, diplopia only   Osteoarthritis    see's Dr. [name]   Osteoporosis    last DEXA 08-03-11    Patient Active Problem List   Diagnosis Date Noted   Port-A-Cath in place 01/26/2021   Malignant neoplasm of upper-outer quadrant of left breast in female, estrogen receptor positive  ([place]) 01/02/2021   Paroxysmal atrial fibrillation ([place]) 08/19/2019   CKD (chronic kidney disease) stage 3, GFR 30-59 ml/min ([place]) 08/09/2017   Hiatal hernia 08/09/2017   Glaucoma 08/08/2016   Restrictive lung disease 09/29/2013   Pulmonary nodule, right 07/14/2012   Myasthenia gravis ([place]) 07/14/2012   HERNIA 07/17/2010   DIVERTICULOSIS, COLON 07/14/2010   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 level reflux 07/14/2010   EXTERNAL HEMORRHOIDS 07/13/2009   GERD 02/16/2009   Osteoarthrosis, unspecified whether generalized or localized, unspecified site 07/01/2008   MITRAL VALVE PROLAPSE 11/17/2007   Hypothyroidism 07/16/2007   Dyslipidemia 07/16/2007   Essential hypertension 07/16/2007   Osteoporosis 07/16/2007    Past Surgical History:  Procedure Laterality Date   ABDOMINAL HYSTERECTOMY     APPENDECTOMY     BREAST LUMPECTOMY Bilateral     several, enign breast lumps removed   BREAST LUMPECTOMY WITH RADIOACTIVE SEED AND SENTINEL LYMPH NODE BIOPSY Left 05/18/2021   Procedure: LEFT BREAST LUMPECTOMY WITH RADIOACTIVE SEED AND LEFT AXILLARY SENTINEL LYMPH NODE BIOPSY;  Surgeon: [name], MD;  Location: [facility];  Service: General;  Laterality: Left;   CARDIOVERSION N/A 12/31/2017   Procedure: CARDIOVERSION;  Surgeon: Josue Hector,  MD;  Location: [facility];  Service: Cardiovascular;  Laterality: N/A;   CARDIOVERSION N/A 02/11/2018   Procedure: CARDIOVERSION;  Surgeon: Josue Hector, MD;  Location: [facility] ENDOSCOPY;  Service: Cardiovascular;  Laterality: N/A;   COLONOSCOPY  09/27/2010   per Dr. Sharlett Iles, benign polyp, no repeats needed    [place]  10/18/2010   Dr. Hassell Done attempted but could reduce this, the entire stomach is above the diaphragm   PORT-A-CATH REMOVAL N/A 05/18/2021   Procedure: REMOVAL PORT-A-CATH;  Surgeon: [name], MD;  Location: [facility];  Service: General;  Laterality: N/A;   PORTACATH PLACEMENT Right  01/25/2021   Procedure: INSERTION PORT-A-CATH;  Surgeon: [name], MD;  Location: [facility];  Service: General;  Laterality: Right;  START TIME OF 8:30 AM FOR 60 MINUTES ROOM 4 [place] [facility]   RADIOACTIVE SEED GUIDED AXILLARY SENTINEL LYMPH NODE Left 05/18/2021   Procedure: RADIOACTIVE SEED GUIDED LEFT AXILLARY NODE EXCISION;  Surgeon: [name], MD;  Location: [facility];  Service: General;  Laterality: Left;   THYROIDECTOMY, PARTIAL  1999   TONSILLECTOMY       OB History   No obstetric history on file.     Family History  Problem Relation Age of Onset   Breast cancer Maternal Grandmother    Diabetes Father    Hyperlipidemia Father    Hypertension Father    Cancer Father        origin unknown   Kidney cancer Mother    Clotting disorder Brother    Cancer Sister        2015, anal ca   Colon cancer Son 69    Social History   Tobacco Use   Smoking status: Never   Smokeless tobacco: Never  Vaping Use   Vaping Use: Never used  Substance Use Topics   Alcohol use: No    Alcohol/week: 0.0 standard drinks   Drug use: No    Home Medications Prior to Admission medications   Medication Sig Start Date End Date Taking? Authorizing Provider  albuterol (VENTOLIN HFA) 108 (90 Base) MCG/ACT inhaler Inhale 2 puffs into the lungs every 4 (four) hours as needed for wheezing or shortness of breath. 08/23/20   Laurey Morale, MD  alendronate (FOSAMAX) 70 MG tablet Take 1 tablet (70 mg total) by mouth once a week. Take with a full glass of water on an empty stomach. 08/07/21   [name], MD  atorvastatin (LIPITOR) 10 MG tablet TAKE 1 TABLET BY MOUTH EVERY DAY 08/15/20   Laurey Morale, MD  flecainide (TAMBOCOR) 50 MG tablet TAKE 1 TABLET BY MOUTH TWICE A DAY 01/24/21   Josue Hector, MD  latanoprost (XALATAN) 0.005 % ophthalmic solution Place 1 drop into both eyes at bedtime.    [provider]  letrozole (FEMARA) 2.5 MG tablet Take 1 tablet (2.5 mg total) by mouth  daily. 08/24/21   [name], MD  levothyroxine (SYNTHROID) 100 MCG tablet TAKE 1 TABLET BY MOUTH EVERY DAY 08/15/20   Laurey Morale, MD  metoprolol succinate (TOPROL-XL) 50 MG 24 hr tablet TAKE 100 MG IN THE MORNING AND TAKE 50 MG IN THE EVENING. TAKE WITH OR IMMEDIATELY FOLLOWING A MEAL. 04/19/21   Josue Hector, MD  Multiple Vitamin (MULTIVITAMIN WITH MINERALS) TABS tablet Take 1 tablet by mouth in the morning. [facility]    [provider]  omeprazole (PRILOSEC) 40 MG capsule TAKE 1 CAPSULE (40 MG TOTAL) BY  MOUTH DAILY AS NEEDED (REFLUX). 08/22/20   Laurey Morale, MD  predniSONE (DELTASONE) 1 MG tablet Take 4 tablets (4 mg total) by mouth daily with breakfast. 03/30/21   [name], MD  timolol (TIMOPTIC) 0.5 % ophthalmic solution Place 1 drop into both eyes in the morning and at bedtime. 04/24/21   [provider]  traMADol (ULTRAM) 50 MG tablet Take 2 tablets (100 mg total) by mouth every 6 (six) hours as needed. 05/18/21   [name], MD  triamcinolone cream (KENALOG) 0.1 % APPLY TO AFFECTED AREA TWICE A DAY 05/11/21   Laurey Morale, MD  triamterene-hydrochlorothiazide (DYAZIDE) 37.5-25 MG capsule Take 1 each (1 capsule total) by mouth daily. 08/20/20 08/20/21  Laurey Morale, MD  XARELTO 15 MG TABS tablet TAKE 1 TABLET (15 MG TOTAL) BY MOUTH DAILY WITH SUPPER. 07/17/21   Josue Hector, MD    Allergies    Codeine, Eliquis [apixaban], Propoxyphene hcl, Meperidine hcl, and Tape  Review of Systems   Review of Systems  Constitutional:  Negative for fatigue.  HENT:  Positive for facial swelling. Negative for tinnitus.   Eyes:  Negative for photophobia, pain and visual disturbance.  Respiratory:  Negative for shortness of breath.   Cardiovascular:  Negative for chest pain.  Gastrointestinal:  Negative for nausea and vomiting.  Musculoskeletal:  Negative for back pain, gait problem and neck pain.  Skin:  Positive for wound.  Neurological:  Negative for  dizziness, weakness, light-headedness, numbness and headaches.  Psychiatric/Behavioral:  Negative for confusion and decreased concentration.    Physical Exam Updated Vital Signs BP (!) 156/103 (BP Location: Right Arm)   Pulse 87   Temp 98.2 F (36.8 C) (Oral)   Resp 18   Ht 5\' 4"  (1.626 m)   Wt 68.9 kg   SpO2 94%   BMI 26.09 kg/m   Physical Exam Vitals and nursing note reviewed.  Constitutional:      General: She is not in acute distress.    Appearance: She is well-developed.  HENT:     Head: Normocephalic.     Comments: Patient with 5 cm laceration to the left forehead, of which the bottom 1 cm is gaping.  Mild venous oozing.  After cleaning the wound, the entire laceration opened up and was full-thickness for the entire length of the wound.  There is moderate venous oozing.  Wound base was clean.  No foreign bodies visualized.  Otherwise there is ecchymosis about the left orbit.  No point tenderness or deformity.    Right Ear: External ear normal.     Left Ear: External ear normal.     Nose: Nose normal. No congestion.  Eyes:     Conjunctiva/sclera: Conjunctivae normal.     Comments: No visible hyphema in either eye.  EOMI bilaterally.  Cardiovascular:     Rate and Rhythm: Normal rate and regular rhythm.     Heart sounds: No murmur heard. Pulmonary:     Effort: No respiratory distress.     Breath sounds: No wheezing, rhonchi or rales.     Comments: Patient tachypneic. Abdominal:     Palpations: Abdomen is soft.     Tenderness: There is no abdominal tenderness. There is no guarding or rebound.  Musculoskeletal:     Cervical back: Normal range of motion and neck supple.     Right lower leg: No edema.     Left lower leg: No edema.  Skin:    General: Skin is warm  and dry.     Findings: No rash.  Neurological:     General: No focal deficit present.     Mental Status: She is alert. Mental status is at baseline.     Motor: No weakness.  Psychiatric:        Mood and  Affect: Mood normal.    ED Results / Procedures / Treatments   Labs (all labs ordered are listed, but only abnormal results are displayed) Labs Reviewed - No data to display  EKG None  Radiology CT Head Wo Contrast  Result Date: 09/30/2021 CLINICAL DATA:  Head trauma EXAM: CT HEAD WITHOUT CONTRAST TECHNIQUE: Contiguous axial images were obtained from the base of the skull through the vertex without intravenous contrast. COMPARISON:  No prior CT head, correlation is made with MRI 01/01/2012. FINDINGS: Brain: No evidence of acute infarction, hemorrhage, cerebral edema, mass, mass effect, or midline shift. No hydrocephalus or extra-axial fluid collection. Ventricles and sulci are within normal limits for age. Vascular: No hyperdense vessel. Skull: Normal. Negative for fracture or focal lesion. Sinuses/Orbits: No acute finding. Status post bilateral lens replacements. Other: The mastoid air cells are well aerated. Left frontal scalp laceration and left periorbital edema. No large hematoma. IMPRESSION: IMPRESSION No acute intracranial process. Electronically Signed   By: Merilyn Baba M.D.   On: 09/30/2021 21:05    Procedures .Marland KitchenLaceration Repair  Date/Time: 09/30/2021 11:01 PM Performed by: Carlisle Cater, PA-C Authorized by: Carlisle Cater, PA-C   Consent:    Consent obtained:  Verbal   Consent given by:  Patient   Risks discussed:  Infection, pain and poor cosmetic result Universal protocol:    Patient identity confirmed:  Verbally with patient Anesthesia:    Anesthesia method:  Local infiltration   Local anesthetic:  Lidocaine 2% WITH epi Laceration details:    Location:  Face   Face location:  Forehead   Length (cm):  5 Pre-procedure details:    Preparation:  Patient was prepped and draped in usual sterile fashion and imaging obtained to evaluate for foreign bodies Exploration:    Imaging obtained: x-ray     Imaging outcome: foreign body not noted     Wound exploration: wound  explored through full range of motion and entire depth of wound visualized     Wound extent: no underlying fracture noted     Contaminated: no   Treatment:    Area cleansed with:  Shur-Clens   Amount of cleaning:  Standard Skin repair:    Repair method:  Sutures   Suture size:  5-0   Suture material:  Nylon   Suture technique:  Simple interrupted   Number of sutures:  6 Approximation:    Approximation:  Close Repair type:    Repair type:  Simple Post-procedure details:    Dressing:  Open (no dressing)   Procedure completion:  Tolerated well, no immediate complications   Medications Ordered in ED Medications  lidocaine-EPINEPHrine (XYLOCAINE W/EPI) 2 %-1:200000 (PF) injection 10 mL (has no administration in time range)    ED Course  I have reviewed the triage vital signs and the nursing notes.  Pertinent labs & imaging results that were available during my care of the patient were reviewed by me and considered in my medical decision making (see chart for details).  Patient seen and examined.  CT head ordered.  Patient will require wound closure.  Vital signs reviewed and are as follows: BP 140/82 (BP Location: Right Arm)   Pulse 73  Temp 98.2 F (36.8 C) (Oral)   Resp 16   Ht 5\' 4"  (1.626 m)   Wt 68.9 kg   SpO2 99%   BMI 26.09 kg/m   11:00 PM patient discussed earlier with Dr. Maryan Rued who has seen patient.  CT results were negative.  Wound repaired as above.  No complications.  Patient counseled on wound care. Patient counseled on need to return or see PCP/urgent care for suture removal in 5-7 days. Patient was urged to return to the Emergency Department urgently with worsening pain, swelling, expanding erythema especially if it streaks away from the affected area, fever, or if they have any other concerns. Patient verbalized understanding.   Patient was counseled on head injury precautions and symptoms that should indicate their return to the ED.  These include  severe worsening headache, vision changes, confusion, loss of consciousness, trouble walking, nausea & vomiting, or weakness/tingling in extremities.      MDM Rules/Calculators/A&P                           Head injury: Mechanical fall.  Head CT given age and anticoagulation status.  CT was negative.  Facial laceration: Wound appears clean.  Repaired without complications.  Patient did have some venous oozing as expected after wound was cleaned.  Controlled with pressure and wound closure.  Final Clinical Impression(s) / ED Diagnoses Final diagnoses:  Facial laceration, initial encounter  Injury of head, initial encounter  Fall in home, initial encounter    Rx / DC Orders ED Discharge Orders     None        Carlisle Cater, Hershal Coria 09/30/21 2303    Blanchie Dessert, MD 10/05/21 1333

## 2021-10-04 ENCOUNTER — Other Ambulatory Visit: Payer: Self-pay | Admitting: Family Medicine

## 2021-10-04 NOTE — Telephone Encounter (Signed)
Pt needs appointment for further refill 

## 2021-10-23 ENCOUNTER — Other Ambulatory Visit: Payer: Self-pay | Admitting: Hematology and Oncology

## 2021-11-02 ENCOUNTER — Other Ambulatory Visit: Payer: Self-pay | Admitting: Family Medicine

## 2021-11-03 ENCOUNTER — Telehealth: Payer: Self-pay | Admitting: *Deleted

## 2021-11-06 NOTE — Progress Notes (Signed)
SURVIVORSHIP VISIT:    BRIEF ONCOLOGIC HISTORY:  Oncology History  Malignant neoplasm of upper-outer quadrant of left breast in female, estrogen receptor positive ([place])  01/02/2021 Initial Diagnosis   Patient experienced chronic left breast tenderness for 2 years with a negative mammogram last year. Screening mammogram showed a 2.5cm central left breast mass and one enlarged lymph node. US showed a 2.6cm mass at the 12 o'clock position and a 1.3cm mass in the left axillary tail. 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 Donne Hazel: Residual IDC 2.2 cm, DCIS intermediate grade, margins negative, 1/2 lymph nodes positive ER 40% weak, PR negative, HER2 negative, Ki-67 20%   05/18/2021 Cancer Staging   Staging form: Breast, AJCC 8th Edition - Pathologic stage from 05/18/2021: No Stage Recommended (ypT2, pN1a, cM0, G2) - Signed by [name], NP on 10/31/2021 Stage prefix: Post-therapy Histologic grading system: 3 grade system    06/27/2021 - 08/15/2021 Radiation Therapy   Site Technique Total Dose (Gy) Dose per Fx (Gy) Completed Fx Beam Energies  Breast, Left: Breast_Lt 3D 50.4/50.4 1.8 28/28 6X, 10X  Breast, Left: Breast_Lt_SCLV 3D 50.4/50.4 1.8 28/28 6X, 10X  Breast, Left: Breast_Lt_Bst 3D 10/10 2 5/5 6X, 10X     08/19/2021 - 08/2028 Anti-estrogen oral therapy   Letrozole 1 mg daily     INTERVAL HISTORY:  Christina Jacobs to  review her survivorship care plan detailing her treatment course for breast cancer, as well as monitoring long-term side effects of that treatment, education regarding health maintenance, screening, and overall wellness and health promotion.     Overall, Christina Jacobs reports feeling quite well.  She is taking letrozole daily with good tolerance.  She has no concerns for arthralgias, or hot flashes.    Christina Jacobs has a question about recent bone density that was concerning for a T score of -2.8/osteoporosis that she was prescribed Fosamax.  She has an incredibly difficult time remembering to take medicine once a week.  Her husband notes that there is a nighttime pill that she is required to take and she struggles with doing that on a daily basis.  She is also concerned about the side effects of esophagitis and also the instructions of having to drink 1 glass of water afterwards and then sit upright.  She has opted not to take the Fosamax and instead is taking calcium and vitamin D.  REVIEW OF SYSTEMS:  Review of Systems  Constitutional:  Negative for appetite change, chills, fatigue, fever and unexpected weight change.  HENT:   Negative for hearing loss, lump/mass and trouble swallowing.   Eyes:  Negative for eye problems and icterus.  Respiratory:  Negative for chest tightness, cough and shortness of breath.   Cardiovascular:  Negative for chest pain, leg swelling and palpitations.  Gastrointestinal:  Negative for abdominal distention, abdominal pain, constipation, diarrhea, nausea and vomiting.  Endocrine: Negative for hot flashes.  Genitourinary:  Negative  for difficulty urinating.   Musculoskeletal:  Negative for arthralgias.  Skin:  Negative for itching and rash.  Neurological:  Negative for dizziness, extremity weakness, headaches and numbness.  Hematological:  Negative for adenopathy. Does not bruise/bleed easily.  Psychiatric/Behavioral:  Negative for depression. The patient is not  nervous/anxious.   Breast: Denies any new nodularity, masses, tenderness, nipple changes, or nipple discharge.      ONCOLOGY TREATMENT TEAM:  1. Surgeon:  Dr. Donne Hazel at [facility] 2. Medical Oncologist: Dr. Lindi Adie  3. Radiation Oncologist: Dr. Lisbeth Renshaw    PAST MEDICAL/SURGICAL HISTORY:  Past Medical History:  Diagnosis Date   Adenomatous colon polyp    Anal fissure    Anxiety    Asthma    Dr. Halford Chessman   Atrial fibrillation [facility])    Bowel obstruction (HCC)    Bursitis    CKD (chronic kidney disease) stage 3, GFR 30-59 ml/min (HCC)    sees Dr. [name]    Complication of anesthesia    post op N/V   Diverticulosis    Dyspnea    After a meal   Dysrhythmia    Afib   GERD (gastroesophageal reflux disease)    Glaucoma    Hiatal hernia    Giant type IV hiatal hernia; sees Dr. [name] at [facility]   Hyperlipidemia    Hypertension    Hypothyroidism    Myasthenia gravis ([place]) 2013   followed at [facility] Dr Scheryl Marten, diplopia only   Osteoarthritis    see's Dr. [name]   Osteoporosis    last DEXA 08-03-11   Past Surgical History:  Procedure Laterality Date   ABDOMINAL HYSTERECTOMY     APPENDECTOMY     BREAST LUMPECTOMY Bilateral     several, enign breast lumps removed   BREAST LUMPECTOMY WITH RADIOACTIVE SEED AND SENTINEL LYMPH NODE BIOPSY Left 05/18/2021   Procedure: LEFT BREAST LUMPECTOMY WITH RADIOACTIVE SEED AND LEFT AXILLARY SENTINEL LYMPH NODE BIOPSY;  Surgeon: [name], MD;  Location: [place];  Service: General;  Laterality: Left;   CARDIOVERSION N/A 12/31/2017   Procedure: CARDIOVERSION;  Surgeon: Josue Hector, MD;  Location: [place];  Service: Cardiovascular;  Laterality: N/A;   CARDIOVERSION N/A 02/11/2018   Procedure: CARDIOVERSION;  Surgeon: Josue Hector, MD;  Location: [place];  Service: Cardiovascular;  Laterality: N/A;   COLONOSCOPY  09/27/2010   per Dr. Sharlett Iles, benign polyp, no repeats needed    EYE  SURGERY     HERNIA REPAIR  10/18/2010   Dr. Hassell Done attempted but could reduce this, the entire stomach is above the diaphragm   PORT-A-CATH REMOVAL N/A 05/18/2021   Procedure: REMOVAL PORT-A-CATH;  Surgeon: [name], MD;  Location: [place];  Service: General;  Laterality: N/A;   PORTACATH PLACEMENT Right 01/25/2021   Procedure: INSERTION PORT-A-CATH;  Surgeon: [name], MD;  Location: [facility];  Service: General;  Laterality: Right;  START TIME OF 8:30 AM FOR 60 MINUTES ROOM 4 [place] [facility]   RADIOACTIVE SEED GUIDED AXILLARY SENTINEL LYMPH NODE Left 05/18/2021   Procedure: RADIOACTIVE SEED GUIDED LEFT AXILLARY NODE EXCISION;  Surgeon: [name], MD;  Location: [place];  Service: General;  Laterality: Left;   THYROIDECTOMY, PARTIAL  1999   TONSILLECTOMY       ALLERGIES:  Allergies  Allergen Reactions   Codeine Hives   Eliquis [Apixaban] Hives   Propoxyphene Hcl Hives   Meperidine Hcl Rash   Tape Rash and Other (See Comments)    EKG  patches EKG patches     CURRENT MEDICATIONS:  Outpatient Encounter Medications as of 11/07/2021  Medication Sig   albuterol (VENTOLIN HFA) 108 (90 Base) MCG/ACT inhaler Inhale 2 puffs into the lungs every 4 (four) hours as needed for wheezing or shortness of breath.   atorvastatin (LIPITOR) 10 MG tablet TAKE 1 TABLET BY MOUTH EVERY DAY   flecainide (TAMBOCOR) 50 MG tablet TAKE 1 TABLET BY MOUTH TWICE A DAY   latanoprost (XALATAN) 0.005 % ophthalmic solution Place 1 drop into both eyes at bedtime.   letrozole (FEMARA) 2.5 MG tablet Take 1 tablet (2.5 mg total) by mouth daily.   levothyroxine (SYNTHROID) 100 MCG tablet TAKE 1 TABLET BY MOUTH EVERY DAY   metoprolol succinate (TOPROL-XL) 50 MG 24 hr tablet TAKE 100 MG IN THE MORNING AND TAKE 50 MG IN THE EVENING. TAKE WITH OR IMMEDIATELY FOLLOWING A MEAL.   Multiple Vitamin (MULTIVITAMIN WITH MINERALS) TABS tablet Take 1 tablet by mouth in the morning. Centrum for Women   omeprazole  (PRILOSEC) 40 MG capsule TAKE 1 CAPSULE (40 MG TOTAL) BY MOUTH DAILY AS NEEDED (REFLUX).   predniSONE (DELTASONE) 1 MG tablet Take 4 tablets (4 mg total) by mouth daily with breakfast.   timolol (TIMOPTIC) 0.5 % ophthalmic solution Place 1 drop into both eyes in the morning and at bedtime.   traMADol (ULTRAM) 50 MG tablet Take 2 tablets (100 mg total) by mouth every 6 (six) hours as needed.   triamcinolone cream (KENALOG) 0.1 % APPLY TO AFFECTED AREA TWICE A DAY   triamterene-hydrochlorothiazide (DYAZIDE) 37.5-25 MG capsule Take 1 each (1 capsule total) by mouth daily.   XARELTO 15 MG TABS tablet TAKE 1 TABLET (15 MG TOTAL) BY MOUTH DAILY WITH SUPPER.   [DISCONTINUED] alendronate (FOSAMAX) 70 MG tablet TAKE 1 TABLET (70 MG TOTAL) BY MOUTH ONCE A WEEK. TAKE WITH A FULL GLASS OF WATER ON AN EMPTY STOMACH.   No facility-administered encounter medications on file as of 11/07/2021.     ONCOLOGIC FAMILY HISTORY:  Family History  Problem Relation Age of Onset   Breast cancer Maternal Grandmother    Diabetes Father    Hyperlipidemia Father    Hypertension Father    Cancer Father        origin unknown   Kidney cancer Mother    Clotting disorder Brother    Cancer Sister        2015, anal ca   Colon cancer Son 36       SOCIAL HISTORY:  Social History   Socioeconomic History   Marital status: Married    Spouse name: Not on file   Number of children: 3   Years of education: Not on file   Highest education level: Not on file  Occupational History   Occupation: retired    Comment: [employer]  Tobacco Use   Smoking status: Never   Smokeless tobacco: Never  Vaping Use   Vaping Use: Never used  Substance and Sexual Activity   Alcohol use: No    Alcohol/week: 0.0 standard drinks   Drug use: No   Sexual activity: Not on file  Other Topics Concern   Not on file  Social History Narrative   Married      Social Determinants of Health   Financial Resource Strain: Low Risk     Difficulty of Paying Living Expenses: Not hard at all  Food Insecurity: No Food Insecurity   Worried About Charity fundraiser in the Last Year:  Never true   Ran Out of Food in the Last Year: Never true  Transportation Needs: No Transportation Needs   Lack of Transportation (Medical): No   Lack of Transportation (Non-Medical): No  Physical Activity: Not on file  Stress: Not on file  Social Connections: Not on file  Intimate Partner Violence: Not on file     OBSERVATIONS/OBJECTIVE:  BP (!) 158/77 (BP Location: Left Arm, Patient Position: Sitting)    Pulse 79    Temp (!) 96.5 F (35.8 C)    Resp 18    Wt 153 lb 8 oz (69.6 kg)    SpO2 93%    BMI 26.35 kg/m  GENERAL: Patient is a well appearing female in no acute distress HEENT:  Sclerae anicteric.  Oropharynx clear and moist. No ulcerations or evidence of oropharyngeal candidiasis. Neck is supple.  NODES:  No cervical, supraclavicular, or axillary lymphadenopathy palpated.  BREAST EXAM: Left breast status postlumpectomy and radiation no sign of local recurrence. LUNGS:  Clear to auscultation bilaterally.  No wheezes or rhonchi. HEART:  Regular rate and rhythm. No murmur appreciated. ABDOMEN:  Soft, nontender.  Positive, normoactive bowel sounds. No organomegaly palpated. MSK:  No focal spinal tenderness to palpation. Full range of motion bilaterally in the upper extremities. EXTREMITIES:  No peripheral edema.   SKIN:  Clear with no obvious rashes or skin changes. No nail dyscrasia. NEURO:  Nonfocal. Well oriented.  Appropriate affect.   LABORATORY DATA:  None for this visit.  DIAGNOSTIC IMAGING:  None for this visit.      ASSESSMENT AND PLAN:  Christina Jacobs is a pleasant 81 y.o. 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  She  presents to the Survivorship Clinic for our initial meeting and routine follow-up post-completion of treatment for breast cancer.    1. Stage IIIA left breast cancer:  Christina Jacobs is continuing to recover from definitive treatment for breast cancer. She will follow-up with her medical oncologist, Dr. Lindi Adie in 6 months with history and physical exam per surveillance protocol.  She will continue her anti-estrogen therapy with Letrozole. Thus far, she is tolerating the Letrozole well, with minimal side effects. She was instructed to make Dr. Lindi Adie or myself aware if she begins to experience any worsening side effects of the medication and I could see her back in clinic to help manage those side effects, as needed. Her mammogram is due 12/2021; orders placed today. Today, a comprehensive survivorship care plan and treatment summary was reviewed with the patient today detailing her breast cancer diagnosis, treatment course, potential late/long-term effects of treatment, appropriate follow-up care with recommendations for the future, and patient education resources.  A copy of this summary, along with a letter will be sent to the patients primary care provider via mail/fax/In Basket message after todays visit.    2. Bone health:  Given Christina Jacobs's age/history of breast cancer and her current treatment regimen including anti-estrogen therapy with Anastrozole, she is at risk for bone demineralization.  Her last DEXA scan was 03/2021, which showed osteoporosis with a t score of -2.8.  We discussed her bone density results and she will do calcium vitamin D and weightbearing exercises and we will repeat testing in 2 years.  At that time we can consider Prolia if her bone density has worsened.  She was given education on specific activities to promote bone  health.  3. Cancer screening:  Due to Christina Jacobs's history and her age, she should receive screening for skin cancers.  The information and recommendations  are listed on the patient's comprehensive care plan/treatment summary and were reviewed in detail with the patient.    4. Health maintenance and wellness promotion: Christina Jacobs was encouraged to consume 5-7 servings of fruits and vegetables per day. We reviewed the "Nutrition Rainbow" handout.  She was also encouraged to engage in moderate to vigorous exercise for 30 minutes per day most days of the week. We discussed the [employer] fitness program, which is designed for cancer survivors to help them become more physically fit after cancer treatments.  She was instructed to limit her alcohol consumption and continue to abstain from tobacco use.     5. Support services/counseling: It is not uncommon for this period of the patient's cancer care trajectory to be one of many emotions and stressors.  She was given information regarding our available services and encouraged to contact me with any questions or for help enrolling in any of our support group/programs.    Follow up instructions:    -Return to cancer center 05/2022  -Mammogram due in 12/2021 -Follow up with surgery 11/2022 -She is welcome to return back to the Survivorship Clinic at any time; no additional follow-up needed at this time.  -Consider referral back to survivorship as a long-term survivor for continued surveillance  The patient was provided an opportunity to ask questions and all were answered. The patient agreed with the plan and demonstrated an understanding of the instructions.   Total encounter time: 45 minutes in face-to-face visit time, chart review, lab review, care coordination, and documentation of the encounter.  Wilber Bihari, NP 11/08/21 9:56 AM Medical Oncology and Hematology [facility] [place], [place] [address] Tel. [phone]    Fax. [phone]  *Total Encounter Time as defined by the Centers for Medicare and Medicaid Services includes, in addition to the face-to-face  time of a patient visit (documented in the note above) non-face-to-face time: obtaining and reviewing outside history, ordering and reviewing medications, tests or procedures, care coordination (communications with other health care professionals or caregivers) and documentation in the medical record.

## 2021-11-07 ENCOUNTER — Other Ambulatory Visit: Payer: Self-pay

## 2021-11-07 ENCOUNTER — Inpatient Hospital Stay: Payer: Medicare HMO | Attending: Hematology and Oncology | Admitting: Adult Health

## 2021-11-07 ENCOUNTER — Encounter: Payer: Self-pay | Admitting: Adult Health

## 2021-11-07 VITALS — BP 158/77 | HR 79 | Temp 96.5°F | Resp 18 | Wt 153.5 lb

## 2021-11-07 DIAGNOSIS — Z170 Estrogen receptor positive status [ER+]: Secondary | ICD-10-CM | POA: Diagnosis not present

## 2021-11-07 DIAGNOSIS — C50412 Malignant neoplasm of upper-outer quadrant of left female breast: Secondary | ICD-10-CM | POA: Diagnosis present

## 2021-11-07 DIAGNOSIS — M810 Age-related osteoporosis without current pathological fracture: Secondary | ICD-10-CM | POA: Insufficient documentation

## 2021-11-08 ENCOUNTER — Encounter: Payer: Self-pay | Admitting: Hematology and Oncology

## 2021-11-09 NOTE — Progress Notes (Signed)
Cardiology Office Note   Date:  11/22/2021   ID:  Christina Jacobs, DOB 07-29-1941, MRN [id number]  PCP:  Laurey Morale, MD  Cardiologist:  Dr. Johnsie Cancel, MD  No chief complaint on file.   History of Present Illness: Christina Jacobs is a 81 y.o. female who presents for follow-up of paroxysmal atrial fibrillation diagnosed 12/2017 previously on Eliquis however with an allergic reaction transition to Xarelto for anticoagulation (low-dose secondary to CKD).  She previously underwent DCCV cardioversion however this failed and she was started on flecainide after normal ETT 01/31/2018.  Echocardiogram from 12/2017 with an EF at 55 to 60% and no structural heart disease.  She ultimately underwent successful DCCV while on a AT 01/2018.  Post cardioversion, she wore a monitor which showed 99% NSR.  Also with a history of CKD stage III, HLD, HTN, hypothyroidism, myasthenia gravis and recent diagnosis of breast cancer s/p left breast radioactive seed guided lumpectomy, sentinel lymph node biopsy and lymph node excision on 05/18/2021.  On low-dose Toprol as higher doses exacerbates her myasthenia gravis.    She has undergone chemotherapy and plans for radiation.   Some insomnia using melatonin Still anxious about prognosis from cancer Had some palpitations with XRT but no PAF   Past Medical History:  Diagnosis Date   Adenomatous colon polyp    Anal fissure    Anxiety    Asthma    Dr. Halford Chessman   Atrial fibrillation [facility])    Bowel obstruction (HCC)    Bursitis    CKD (chronic kidney disease) stage 3, GFR 30-59 ml/min ([place])    sees Dr. [name]    Complication of anesthesia    post op N/V   Diverticulosis    Dyspnea    After a meal   Dysrhythmia    Afib   GERD (gastroesophageal reflux disease)    Glaucoma    Hiatal hernia    Giant type IV hiatal hernia; sees Dr. [name] at [facility]   Hyperlipidemia    Hypertension    Hypothyroidism    Myasthenia gravis ([place]) 2013    followed at [facility] Dr Scheryl Marten, diplopia only   Osteoarthritis    see's Dr. [name]   Osteoporosis    last DEXA 08-03-11    Past Surgical History:  Procedure Laterality Date   ABDOMINAL HYSTERECTOMY     APPENDECTOMY     BREAST LUMPECTOMY Bilateral     several, enign breast lumps removed   BREAST LUMPECTOMY WITH RADIOACTIVE SEED AND SENTINEL LYMPH NODE BIOPSY Left 05/18/2021   Procedure: LEFT BREAST LUMPECTOMY WITH RADIOACTIVE SEED AND LEFT AXILLARY SENTINEL LYMPH NODE BIOPSY;  Surgeon: [name], MD;  Location: [place];  Service: General;  Laterality: Left;   CARDIOVERSION N/A 12/31/2017   Procedure: CARDIOVERSION;  Surgeon: Josue Hector, MD;  Location: [place];  Service: Cardiovascular;  Laterality: N/A;   CARDIOVERSION N/A 02/11/2018   Procedure: CARDIOVERSION;  Surgeon: Josue Hector, MD;  Location: [place];  Service: Cardiovascular;  Laterality: N/A;   COLONOSCOPY  09/27/2010   per Dr. Sharlett Iles, benign polyp, no repeats needed    [place]  10/18/2010   Dr. Hassell Done attempted but could reduce this, the entire stomach is above the diaphragm   PORT-A-CATH REMOVAL N/A 05/18/2021   Procedure: REMOVAL PORT-A-CATH;  Surgeon: [name], MD;  Location: [place];  Service: General;  Laterality: N/A;   PORTACATH PLACEMENT Right 01/25/2021  Procedure: INSERTION PORT-A-CATH;  Surgeon: [name], MD;  [facility];  Service: General;  Laterality: Right;  START TIME OF 8:30 AM FOR 60 MINUTES ROOM 4 [place] IQ   RADIOACTIVE SEED GUIDED AXILLARY SENTINEL LYMPH NODE Left 05/18/2021   Procedure: RADIOACTIVE SEED GUIDED LEFT AXILLARY NODE EXCISION;  Surgeon: [name], MD;  Location: [place];  Service: General;  Laterality: Left;   THYROIDECTOMY, PARTIAL  1999   TONSILLECTOMY       Current Outpatient Medications  Medication Sig Dispense Refill   albuterol (VENTOLIN HFA) 108 (90 Base) MCG/ACT inhaler Inhale 2 puffs into the  lungs every 4 (four) hours as needed for wheezing or shortness of breath. 18 g 5   atorvastatin (LIPITOR) 10 MG tablet Take 1 tablet (10 mg total) by mouth daily. 90 tablet 3   flecainide (TAMBOCOR) 50 MG tablet TAKE 1 TABLET BY MOUTH TWICE A DAY 180 tablet 1   latanoprost (XALATAN) 0.005 % ophthalmic solution Place 1 drop into both eyes at bedtime.     letrozole (FEMARA) 2.5 MG tablet Take 1 tablet (2.5 mg total) by mouth daily. 90 tablet 3   levothyroxine (SYNTHROID) 100 MCG tablet Take 1 tablet (100 mcg total) by mouth daily. 90 tablet 3   metoprolol succinate (TOPROL-XL) 50 MG 24 hr tablet TAKE 100 MG IN THE MORNING AND TAKE 50 MG IN THE EVENING. TAKE WITH OR IMMEDIATELY FOLLOWING A MEAL. 270 tablet 3   Multiple Vitamin (MULTIVITAMIN WITH MINERALS) TABS tablet Take 1 tablet by mouth in the morning. Centrum for Women     omeprazole (PRILOSEC) 40 MG capsule TAKE 1 CAPSULE (40 MG TOTAL) BY MOUTH DAILY AS NEEDED (REFLUX). 90 capsule 0   predniSONE (DELTASONE) 1 MG tablet Take 4 tablets (4 mg total) by mouth daily with breakfast.     timolol (TIMOPTIC) 0.5 % ophthalmic solution Place 1 drop into both eyes in the morning and at bedtime.     traMADol (ULTRAM) 50 MG tablet Take 2 tablets (100 mg total) by mouth every 6 (six) hours as needed. 10 tablet 0   triamcinolone cream (KENALOG) 0.1 % APPLY TO AFFECTED AREA TWICE A DAY 90 g 0   XARELTO 15 MG TABS tablet TAKE 1 TABLET (15 MG TOTAL) BY MOUTH DAILY WITH SUPPER. 90 tablet 1   triamterene-hydrochlorothiazide (DYAZIDE) 37.5-25 MG capsule Take 1 each (1 capsule total) by mouth daily. 90 capsule 3   No current facility-administered medications for this visit.    Allergies:   Codeine, Eliquis [apixaban], Propoxyphene hcl, Meperidine hcl, and Tape    Social History:  The patient  reports that she has never smoked. She has never used smokeless tobacco. She reports that she does not drink alcohol and does not use drugs.   Family History:  The patient's  family history includes Breast cancer in her maternal grandmother; Cancer in her father and sister; Clotting disorder in her brother; Colon cancer (age of onset: 48) in her son; Diabetes in her father; Hyperlipidemia in her father; Hypertension in her father; Kidney cancer in her mother.    ROS:  Please see the history of present illness. Otherwise, review of systems are positive for none.   All other systems are reviewed and negative.    PHYSICAL EXAM: VS:  BP (!) 130/98    Pulse 70    Ht 5\' 3"  (1.6 m)    Wt 153 lb (69.4 kg)    SpO2 95%    BMI 27.10 kg/m  ,  BMI Body mass index is 27.1 kg/m.  Affect appropriate Healthy:  appears stated age 21: normal Neck supple with no adenopathy JVP normal no bruits no thyromegaly Lungs clear with no wheezing and good diaphragmatic motion Heart:  S1/S2 no murmur, no rub, gallop or click PMI normal Abdomen: benighn, BS positve, no tenderness, no AAA no bruit.  No HSM or HJR Distal pulses intact with no bruits No edema Neuro non-focal Skin warm and dry No muscular weakness    EKG:  SR rate 61 nonspecific ST changes 01/23/21   Recent Labs: 11/20/2021: ALT 13; BUN 25; Creatinine, Ser 1.54; Hemoglobin 14.9; Platelets 159.0; Potassium 3.7; Sodium 140; TSH 0.97    Lipid Panel    Component Value Date/Time   CHOL 180 11/20/2021 1058   TRIG 140.0 11/20/2021 1058   HDL 57.20 11/20/2021 1058   CHOLHDL 3 11/20/2021 1058   VLDL 28.0 11/20/2021 1058   LDLCALC 95 11/20/2021 1058   LDLCALC 91 08/23/2020 1144   LDLDIRECT 142.2 07/27/2013 1007      Wt Readings from Last 3 Encounters:  11/22/21 153 lb (69.4 kg)  11/20/21 151 lb 4 oz (68.6 kg)  11/07/21 153 lb 8 oz (69.6 kg)     ASSESSMENT AND PLAN:  1. PAF: -Maintaining NSR  -Continue Flecainide, Xarelto, and beta blocker  -No s/s of bleeding in stool or urine.  -CBC stable on recent labs -On reduced dose Xarelto due to renal dysfunction   2. CKD stage III: -Creatinine, 1.42   -Follows  with nephrology      3.  HLD:  -Last LDL, 91 -Continue atorvastatin   4. HTN: -Well controlled.  Continue current medications and low sodium Dash type diet.    5. Breast cancer: -F/U oncology chemo/XRT Anti estrogen Letrozole Stage 3A    Current medicines are reviewed at length with the patient today.  The patient does not have concerns regarding medicines.  The following changes have been made:  no change  Labs/ tests ordered today include: NOne   No orders of the defined types were placed in this encounter.    Disposition:   FU in 6 months   Signed, Jenkins Rouge, MD  11/22/2021 2:09 PM    [facility] [address] [place], [place], [place]  [address] Phone: [phone]; Fax: [phone]

## 2021-11-11 ENCOUNTER — Other Ambulatory Visit: Payer: Self-pay | Admitting: Cardiovascular Disease

## 2021-11-17 ENCOUNTER — Other Ambulatory Visit: Payer: Self-pay | Admitting: Family Medicine

## 2021-11-20 ENCOUNTER — Encounter: Payer: Self-pay | Admitting: Family Medicine

## 2021-11-20 ENCOUNTER — Ambulatory Visit (INDEPENDENT_AMBULATORY_CARE_PROVIDER_SITE_OTHER): Payer: Medicare HMO | Admitting: Family Medicine

## 2021-11-20 VITALS — BP 146/86 | HR 82 | Temp 97.9°F | Wt 151.2 lb

## 2021-11-20 DIAGNOSIS — J4530 Mild persistent asthma, uncomplicated: Secondary | ICD-10-CM

## 2021-11-20 DIAGNOSIS — E785 Hyperlipidemia, unspecified: Secondary | ICD-10-CM

## 2021-11-20 DIAGNOSIS — R739 Hyperglycemia, unspecified: Secondary | ICD-10-CM

## 2021-11-20 DIAGNOSIS — I10 Essential (primary) hypertension: Secondary | ICD-10-CM

## 2021-11-20 DIAGNOSIS — E559 Vitamin D deficiency, unspecified: Secondary | ICD-10-CM | POA: Diagnosis not present

## 2021-11-20 DIAGNOSIS — I480 Paroxysmal atrial fibrillation: Secondary | ICD-10-CM

## 2021-11-20 DIAGNOSIS — E039 Hypothyroidism, unspecified: Secondary | ICD-10-CM | POA: Diagnosis not present

## 2021-11-20 DIAGNOSIS — K219 Gastro-esophageal reflux disease without esophagitis: Secondary | ICD-10-CM

## 2021-11-20 DIAGNOSIS — N183 Chronic kidney disease, stage 3 unspecified: Secondary | ICD-10-CM

## 2021-11-20 LAB — BASIC METABOLIC PANEL
BUN: 25 mg/dL — ABNORMAL HIGH (ref 6–23)
CO2: 28 mEq/L (ref 19–32)
Calcium: 9.8 mg/dL (ref 8.4–10.5)
Chloride: 104 mEq/L (ref 96–112)
Creatinine, Ser: 1.54 mg/dL — ABNORMAL HIGH (ref 0.40–1.20)
GFR: 31.67 mL/min — ABNORMAL LOW (ref >60.00)
Glucose, Bld: 104 mg/dL — ABNORMAL HIGH (ref 70–99)
Potassium: 3.7 mEq/L (ref 3.5–5.1)
Sodium: 140 mEq/L (ref 135–145)

## 2021-11-20 LAB — HEPATIC FUNCTION PANEL
ALT: 13 U/L (ref 0–35)
AST: 19 U/L (ref 0–37)
Albumin: 3.8 g/dL (ref 3.5–5.2)
Alkaline Phosphatase: 62 U/L (ref 39–117)
Bilirubin, Direct: 0.1 mg/dL (ref 0.0–0.3)
Total Bilirubin: 1 mg/dL (ref 0.2–1.2)
Total Protein: 7.3 g/dL (ref 6.0–8.3)

## 2021-11-20 LAB — CBC WITH DIFFERENTIAL/PLATELET
Basophils Absolute: 0 10*3/uL (ref 0.0–0.1)
Basophils Relative: 0.5 % (ref 0.0–3.0)
Eosinophils Absolute: 0.1 10*3/uL (ref 0.0–0.7)
Eosinophils Relative: 0.7 % (ref 0.0–5.0)
HCT: 46 % (ref 36.0–46.0)
Hemoglobin: 14.9 g/dL (ref 12.0–15.0)
Lymphocytes Relative: 14.6 % (ref 12.0–46.0)
Lymphs Abs: 1.1 10*3/uL (ref 0.7–4.0)
MCHC: 32.5 g/dL (ref 30.0–36.0)
MCV: 95.2 fl (ref 78.0–100.0)
Monocytes Absolute: 0.5 10*3/uL (ref 0.1–1.0)
Monocytes Relative: 7.3 % (ref 3.0–12.0)
Neutro Abs: 5.7 10*3/uL (ref 1.4–7.7)
Neutrophils Relative %: 76.9 % (ref 43.0–77.0)
Platelets: 159 10*3/uL (ref 150.0–400.0)
RBC: 4.83 Mil/uL (ref 3.87–5.11)
RDW: 14 % (ref 11.5–15.5)
WBC: 7.4 10*3/uL (ref 4.0–10.5)

## 2021-11-20 LAB — VITAMIN D 25 HYDROXY (VIT D DEFICIENCY, FRACTURES): VITD: 70.88 ng/mL (ref 30.00–100.00)

## 2021-11-20 LAB — LIPID PANEL
Cholesterol: 180 mg/dL (ref 0–200)
HDL: 57.2 mg/dL (ref >39.00)
LDL Cholesterol: 95 mg/dL (ref 0–99)
NonHDL: 122.68
Total CHOL/HDL Ratio: 3
Triglycerides: 140 mg/dL (ref 0.0–149.0)
VLDL: 28 mg/dL (ref 0.0–40.0)

## 2021-11-20 LAB — TSH: TSH: 0.97 u[IU]/mL (ref 0.35–5.50)

## 2021-11-20 LAB — T3, FREE: T3, Free: 3.7 pg/mL (ref 2.3–4.2)

## 2021-11-20 LAB — T4, FREE: Free T4: 1.47 ng/dL (ref 0.60–1.60)

## 2021-11-20 LAB — HEMOGLOBIN A1C: Hgb A1c MFr Bld: 6 % (ref 4.6–6.5)

## 2021-11-20 MED ORDER — LEVOTHYROXINE SODIUM 100 MCG PO TABS: 100.0000 ug | ORAL_TABLET | Freq: Every day | ORAL | 3 refills | Status: DC

## 2021-11-20 MED ORDER — ATORVASTATIN CALCIUM 10 MG PO TABS: 10.0000 mg | ORAL_TABLET | Freq: Every day | ORAL | 3 refills | Status: DC

## 2021-11-20 NOTE — Progress Notes (Signed)
Subjective:    Patient ID: Christina Jacobs, female    DOB: 1940-11-29, 81 y.o.   MRN: [id number]  HPI Here to follow up on issues. She feels well in general. She sees the Oncology Clinic every 3 months. Her HTN and PAF are stable. Her GERD and astham are stable. Her CKD is stable, and last July her creatinine was 1.42 and the GFR was 37.    Review of Systems  Constitutional: Negative.  Negative for activity change, appetite change, diaphoresis, fatigue, fever and unexpected weight change.  HENT: Negative.  Negative for congestion, ear pain, hearing loss, nosebleeds, sore throat, tinnitus, trouble swallowing and voice change.   Eyes: Negative.  Negative for photophobia, pain, discharge, redness and visual disturbance.  Respiratory: Negative.  Negative for apnea, cough, choking, chest tightness, shortness of breath, wheezing and stridor.   Cardiovascular: Negative.  Negative for chest pain, palpitations and leg swelling.  Gastrointestinal: Negative.  Negative for abdominal distention, abdominal pain, blood in stool, constipation, diarrhea, nausea, rectal pain and vomiting.  Genitourinary: Negative.  Negative for difficulty urinating, dysuria, enuresis, flank pain, frequency, hematuria, menstrual problem, urgency, vaginal bleeding, vaginal discharge and vaginal pain.  Musculoskeletal: Negative.  Negative for arthralgias, back pain, gait problem, joint swelling, myalgias, neck pain and neck stiffness.  Skin: Negative.  Negative for color change, pallor, rash and wound.  Neurological: Negative.  Negative for dizziness, tremors, seizures, syncope, speech difficulty, weakness, light-headedness, numbness and headaches.  Hematological:  Negative for adenopathy. Does not bruise/bleed easily.  Psychiatric/Behavioral: Negative.  Negative for agitation, behavioral problems, confusion, dysphoric mood, hallucinations and sleep disturbance. The patient is not nervous/anxious.       Objective:   Physical  Exam Constitutional:      General: She is not in acute distress.    Appearance: Normal appearance. She is well-developed.  HENT:     Head: Normocephalic and atraumatic.     Right Ear: External ear normal.     Left Ear: External ear normal.     Nose: Nose normal.     Mouth/Throat:     Pharynx: No oropharyngeal exudate.  Eyes:     General: No scleral icterus.    Conjunctiva/sclera: Conjunctivae normal.     Pupils: Pupils are equal, round, and reactive to light.  Neck:     Thyroid: No thyromegaly.     Vascular: No JVD.  Cardiovascular:     Rate and Rhythm: Normal rate and regular rhythm.     Heart sounds: Normal heart sounds. No murmur heard.   No friction rub. No gallop.  Pulmonary:     Effort: Pulmonary effort is normal. No respiratory distress.     Breath sounds: Normal breath sounds. No wheezing or rales.  Chest:     Chest wall: No tenderness.  Abdominal:     General: Bowel sounds are normal. There is no distension.     Palpations: Abdomen is soft. There is no mass.     Tenderness: There is no abdominal tenderness. There is no guarding or rebound.  Musculoskeletal:        General: No tenderness. Normal range of motion.     Cervical back: Normal range of motion and neck supple.  Lymphadenopathy:     Cervical: No cervical adenopathy.  Skin:    General: Skin is warm and dry.     Findings: No erythema or rash.  Neurological:     Mental Status: She is alert and oriented to person, place, and time.  Cranial Nerves: No cranial nerve deficit.     Motor: No abnormal muscle tone.     Coordination: Coordination normal.     Deep Tendon Reflexes: Reflexes are normal and symmetric. Reflexes normal.  Psychiatric:        Behavior: Behavior normal.        Thought Content: Thought content normal.        Judgment: Judgment normal.          Assessment & Plan:  She is doing well as far as HTN, PAF, GERD, asthma, and CKD are concerned. She will give fasting labs today to check  thyroid levels and a lipid panel. We spent a total of ( 33  ) minutes reviewing records and discussing these issues.  Alysia Penna, MD

## 2021-11-22 ENCOUNTER — Ambulatory Visit: Payer: Medicare HMO | Admitting: Cardiovascular Disease

## 2021-11-22 ENCOUNTER — Encounter: Payer: Self-pay | Admitting: Cardiovascular Disease

## 2021-11-22 ENCOUNTER — Other Ambulatory Visit: Payer: Self-pay

## 2021-11-22 VITALS — BP 130/98 | HR 70 | Ht 63.0 in | Wt 153.0 lb

## 2021-11-22 DIAGNOSIS — I480 Paroxysmal atrial fibrillation: Secondary | ICD-10-CM

## 2021-11-22 DIAGNOSIS — E785 Hyperlipidemia, unspecified: Secondary | ICD-10-CM

## 2021-11-22 NOTE — Patient Instructions (Signed)
Medication Instructions:  *If you need a refill on your cardiac medications before your next appointment, please call your pharmacy*  Lab Work: If you have labs (blood work) drawn today and your tests are completely normal, you will receive your results only by: [place] (if you have MyChart) OR A paper copy in the mail If you have any lab test that is abnormal or we need to change your treatment, we will call you to review the results.  Testing/Procedures: None ordered today.  Follow-Up: At [facility], you and your health needs are our priority.  As part of our continuing mission to provide you with exceptional heart care, we have created designated Provider Care Teams.  These Care Teams include your primary Cardiologist (physician) and Advanced Practice Providers (APPs -  Physician Assistants and Nurse Practitioners) who all work together to provide you with the care you need, when you need it.  We recommend signing up for the patient portal called "MyChart".  Sign up information is provided on this After Visit Summary.  MyChart is used to connect with patients for Virtual Visits (Telemedicine).  Patients are able to view lab/test results, encounter notes, upcoming appointments, etc.  Non-urgent messages can be sent to your provider as well.   To learn more about what you can do with MyChart, go to [email or web address].    Your next appointment:   6 month(s)  The format for your next appointment:   In Person  Provider:   Jenkins Rouge, MD {

## 2021-11-27 ENCOUNTER — Ambulatory Visit: Payer: Medicare HMO | Attending: General Surgery

## 2021-11-27 ENCOUNTER — Other Ambulatory Visit: Payer: Self-pay

## 2021-11-27 VITALS — Wt 154.0 lb

## 2021-11-27 DIAGNOSIS — Z483 Aftercare following surgery for neoplasm: Secondary | ICD-10-CM | POA: Insufficient documentation

## 2021-11-27 NOTE — Therapy (Signed)
[facility] [address] [place], [place], [address] Phone: [phone]   Fax:  [phone]  Physical Therapy Treatment  Patient Details  Name: Christina Jacobs MRN: [id number] Date of Birth: 1941/04/06 Referring Provider (PT): Dr. [name]   Encounter Date: 11/27/2021   PT End of Session - 11/27/21 1414     Visit Number 5   # unchanged due to screen only   PT Start Time 66    PT Stop Time 1416    PT Time Calculation (min) 4 min    Activity Tolerance Patient tolerated treatment well    Behavior During Therapy [facility] for tasks assessed/performed             Past Medical History:  Diagnosis Date   Adenomatous colon polyp    Anal fissure    Anxiety    Asthma    Dr. Halford Chessman   Atrial fibrillation [facility])    Bowel obstruction ([place])    Bursitis    CKD (chronic kidney disease) stage 3, GFR 30-59 ml/min ([place])    sees Dr. [name]    Complication of anesthesia    post op N/V   Diverticulosis    Dyspnea    After a meal   Dysrhythmia    Afib   GERD (gastroesophageal reflux disease)    Glaucoma    Hiatal hernia    Giant type IV hiatal hernia; sees Dr. [name] at [facility]   Hyperlipidemia    Hypertension    Hypothyroidism    Myasthenia gravis ([place]) 2013   followed at [facility] Dr Scheryl Marten, diplopia only   Osteoarthritis    see's Dr. [name]   Osteoporosis    last DEXA 08-03-11    Past Surgical History:  Procedure Laterality Date   ABDOMINAL HYSTERECTOMY     APPENDECTOMY     BREAST LUMPECTOMY Bilateral     several, enign breast lumps removed   BREAST LUMPECTOMY WITH RADIOACTIVE SEED AND SENTINEL LYMPH NODE BIOPSY Left 05/18/2021   Procedure: LEFT BREAST LUMPECTOMY WITH RADIOACTIVE SEED AND LEFT AXILLARY SENTINEL LYMPH NODE BIOPSY;  Surgeon: [name], MD;  Location: [place];  Service: General;  Laterality: Left;   CARDIOVERSION N/A 12/31/2017   Procedure: CARDIOVERSION;  Surgeon:  Josue Hector, MD;  Location: [place];  Service: Cardiovascular;  Laterality: N/A;   CARDIOVERSION N/A 02/11/2018   Procedure: CARDIOVERSION;  Surgeon: Josue Hector, MD;  [facility]: [facility];  Service: Cardiovascular;  Laterality: N/A;   COLONOSCOPY  09/27/2010   per Dr. Sharlett Iles, benign polyp, no repeats needed    [place]  10/18/2010   Dr. Hassell Done attempted but could reduce this, the entire stomach is above the diaphragm   PORT-A-CATH REMOVAL N/A 05/18/2021   Procedure: REMOVAL PORT-A-CATH;  Surgeon: [name], MD;  Location: [place];  Service: General;  Laterality: N/A;   PORTACATH PLACEMENT Right 01/25/2021   Procedure: INSERTION PORT-A-CATH;  Surgeon: [name], MD;  [facility];  Service: General;  Laterality: Right;  START TIME OF 8:30 AM FOR 60 MINUTES ROOM 4 [place] IQ   RADIOACTIVE SEED GUIDED AXILLARY SENTINEL LYMPH NODE Left 05/18/2021   Procedure: RADIOACTIVE SEED GUIDED LEFT AXILLARY NODE EXCISION;  Surgeon: [name], MD;  Location: [place];  Service: General;  Laterality: Left;   THYROIDECTOMY, PARTIAL  1999   TONSILLECTOMY      Vitals:   11/27/21 1412  Weight: 154 lb (  69.9 kg)     Subjective Assessment - 11/27/21 1412     Subjective Patient is here for her 3 month [facility]ZO screen.    Pertinent History Patient was diagnosed on 12/06/2020 with left grade III invasive ductal carcinoma breast cancer. She underwent neoadjuvant chemotherapy from 01/26/2021-03/11/2021 followed by a left lumpectomy and sentinel node biopsy on 05/18/2021 with 1 of 2 lymph nodes being positive for cancer. It is ER positive, PR negative, and HER2 negative with a Ki67 of 20%. She has a hiatal hernia which is severe and limits her ability to exercise.                    L-DEX FLOWSHEETS - 11/27/21 1400       L-DEX LYMPHEDEMA SCREENING   Measurement Type Unilateral    L-DEX MEASUREMENT EXTREMITY Upper Extremity    POSITION  Standing     DOMINANT SIDE Right    At Risk Side Left    BASELINE SCORE (UNILATERAL) -1    L-DEX SCORE (UNILATERAL) -1.4    VALUE CHANGE (UNILAT) -0.4                                     PT Long Term Goals - 07/04/21 1309       PT LONG TERM GOAL #1   Title Patient will demonstrate she has regained full shoulder ROM and function post operatively compared to baselines.    Baseline pull in the axilla from cording    Status Achieved      PT LONG TERM GOAL #2   Title Patient will increase left shoulder active flexion ROM to >/= 130 degrees to reach overhead without pain.    Status Achieved      PT LONG TERM GOAL #3   Title Patient will increase left shoulder active abduction ROM to >/= 130 degrees to lay in needed position for radiation without pain.    Status Achieved      PT LONG TERM GOAL #4   Title Patient will report >/= 40% reduction in left axillary pain while reaching overhead.    Status Achieved      PT LONG TERM GOAL #5   Title Patient will verbalize good understanding of lymphedema risk reduction practices.    Status Achieved                   Plan - 11/27/21 1415     Clinical Impression Statement Pt returns for her 3 month L-Dex screen. Her change from baseline of -0.4 is WNLs so no further treatment is required at this time except to cont every 3 month L-Dex screens which pt is agreeable to.    PT Next Visit Plan Cont every 3 month L-Dex screens for up to 2 years from her SLNB (~05/19/2023)    Consulted and Agree with Plan of Care Patient             Patient will benefit from skilled therapeutic intervention in order to improve the following deficits and impairments:     Visit Diagnosis: Aftercare following surgery for neoplasm     Problem List Patient Active Problem List   Diagnosis Date Noted   Port-A-Cath in place 01/26/2021   Malignant neoplasm of upper-outer quadrant of left breast in female, estrogen receptor positive  ([place]) 01/02/2021   Paroxysmal atrial fibrillation ([place]) 08/19/2019   CKD (chronic kidney disease) stage 3,  GFR 30-59 ml/min (HCC) 08/09/2017   Hiatal hernia 08/09/2017   Glaucoma 08/08/2016   Restrictive lung disease 09/29/2013   Myasthenia gravis ([place]) 07/14/2012   HERNIA 07/17/2010   DIVERTICULOSIS, COLON 07/14/2010   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 level reflux 07/14/2010   EXTERNAL HEMORRHOIDS 07/13/2009   GERD 02/16/2009   Osteoarthrosis, unspecified whether generalized or localized, unspecified site 07/01/2008   MITRAL VALVE PROLAPSE 11/17/2007   Hypothyroidism 07/16/2007   Dyslipidemia 07/16/2007   Essential hypertension 07/16/2007   Osteoporosis 07/16/2007    Otelia Limes, PTA 11/27/2021, 2:18 PM  [facility] @ [address] [place], [place], [address] Phone: [phone]   Fax:  [phone]  Name: Christina Jacobs MRN: [id number] Date of Birth: 1941-07-24

## 2021-12-01 ENCOUNTER — Other Ambulatory Visit: Payer: Self-pay | Admitting: Family Medicine

## 2021-12-04 ENCOUNTER — Other Ambulatory Visit: Payer: Self-pay | Admitting: *Deleted

## 2021-12-04 ENCOUNTER — Telehealth: Payer: Self-pay | Admitting: *Deleted

## 2021-12-05 ENCOUNTER — Other Ambulatory Visit: Payer: Self-pay | Admitting: *Deleted

## 2021-12-06 ENCOUNTER — Telehealth: Payer: Self-pay | Admitting: *Deleted

## 2021-12-06 NOTE — Telephone Encounter (Signed)
Received call from pt stating she is out of her Letrozole and the pharmacy had informed her that she should have more tablets at home.  RN placed call to pharmacy on file and verified with pharmacist that pt picked up a 90 day prescription of Letrozole on 11/07/21.  RN educated pt to check around the house to see if it has been misplaced and to reach out to the pharmacy if she can not find it.  The pharmacy may be able to provide pt with an early refill.  Pt verbalized understanding and appreciative of advice.

## 2022-01-02 NOTE — Assessment & Plan Note (Signed)
01/02/2021: Screening mammogram detected left breast mass at 12 o'clock position 2.5 cm spiculated mass. Abnormal left axillary lymph node 1.3 cm. 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 Donne Hazel: Residual IDC 2.2 cm, DCIS intermediate grade, margins negative, 1/2 lymph nodes positive ER 40% weak, PR negative, HER2 negative, Ki-67 20% 3.Adjuvant radiation completing 08/14/21 4.Follow-up adjuvant antiestrogen therapy with anastrozole 1 mg daily x7 years to start 08/19/21 ------------------------------------------------------------------------------------------------------------ Anastrozole Toxicities: . Planned treatment duration is 7 years.  Breast Cancer Surveillance: 1. Breast Exam: 01/03/22: Benign 2. Mammograms: 05/04/21: benign   Return to clinic in 1 year for follow up

## 2022-01-02 NOTE — Progress Notes (Incomplete)
? ?Patient Care Team: ?Laurey Morale, MD as PCP - General ?Josue Hector, MD as PCP - Cardiology (Cardiology) ?Elsie Stain, MD (Pulmonary Disease) ?[name], MD as Consulting Physician (General Surgery) ?[name], MD as Consulting Physician (Hematology and Oncology) ?Kyung Rudd, MD as Consulting Physician (Radiation Oncology) ?Leroy Libman, MD as Consulting Physician (Neurology) ? ?DIAGNOSIS: No diagnosis found. ? ?SUMMARY OF ONCOLOGIC HISTORY: ?Oncology History  ?Malignant neoplasm of upper-outer quadrant of left breast in female, estrogen receptor positive ([place])  ?01/02/2021 Initial Diagnosis  ? Patient experienced chronic left breast tenderness for 2 years with a negative mammogram last year. Screening mammogram showed a 2.5cm central left breast mass and one enlarged lymph node. US showed a 2.6cm mass at the 12 o'clock position and a 1.3cm mass in the left axillary tail. 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  ?01/04/2021 Cancer Staging  ? Staging form: Breast, AJCC 8th Edition ?- Clinical stage from 01/04/2021: Stage IIIA (cT2, cN1(f), cM0, G3, ER+, PR-, HER2-) - Signed by [name], MD on 01/04/2021 ?Stage prefix: Initial diagnosis ?Method of lymph node assessment: Core biopsy ? ?  ?01/26/2021 - 03/11/2021 Chemotherapy  ? 4 cycles of Taxotere and Cytoxan neoadjuvant chemotherapy ? ? ?  ? ?  ?05/18/2021 Surgery  ? Left lumpectomy with Dr. Donne Hazel: Residual IDC 2.2 cm, DCIS intermediate grade, margins negative, 1/2 lymph nodes positive ER 40% weak, PR negative, HER2 negative, Ki-67 20% ?  ?05/18/2021 Cancer Staging  ? Staging form: Breast, AJCC 8th Edition ?- Pathologic stage from 05/18/2021: No Stage Recommended (ypT2, pN1a, cM0, G2) - Signed by [name], NP on 10/31/2021 ?Stage prefix: Post-therapy ?Histologic grading system: 3 grade  system ? ?  ?06/27/2021 - 08/15/2021 Radiation Therapy  ? Site Technique Total Dose (Gy) Dose per Fx (Gy) Completed Fx Beam Energies  ?Breast, Left: Breast_Lt 3D 50.4/50.4 1.8 28/28 6X, 10X  ?Breast, Left: Breast_Lt_SCLV 3D 50.4/50.4 1.8 28/28 6X, 10X  ?Breast, Left: Breast_Lt_Bst 3D 10/10 2 5/5 6X, 10X  ? ?  ?08/19/2021 - 08/2028 Anti-estrogen oral therapy  ? Letrozole 1 mg daily ?  ? ? ?CHIEF COMPLIANT: Follow-up of left breast cancer ? ?INTERVAL HISTORY: Christina Jacobs is a 81 y.o. with above-mentioned history of left breast cancer having undergone chemotherapy. She presents to the clinic today for follow-up.  ? ?ALLERGIES:  is allergic to codeine, eliquis [apixaban], propoxyphene hcl, meperidine hcl, and tape. ? ?MEDICATIONS:  ?Current Outpatient Medications  ?Medication Sig Dispense Refill  ? albuterol (VENTOLIN HFA) 108 (90 Base) MCG/ACT inhaler Inhale 2 puffs into the lungs every 4 (four) hours as needed for wheezing or shortness of breath. 18 g 5  ? atorvastatin (LIPITOR) 10 MG tablet Take 1 tablet (10 mg total) by mouth daily. 90 tablet 3  ? flecainide (TAMBOCOR) 50 MG tablet TAKE 1 TABLET BY MOUTH TWICE A DAY 180 tablet 1  ? latanoprost (XALATAN) 0.005 % ophthalmic solution Place 1 drop into both eyes at bedtime.    ? letrozole (FEMARA) 2.5 MG tablet Take 1 tablet (2.5 mg total) by mouth daily. 90 tablet 3  ? levothyroxine (SYNTHROID) 100 MCG tablet Take 1 tablet (100 mcg total) by mouth daily. 90 tablet 3  ? metoprolol succinate (TOPROL-XL) 50 MG 24 hr tablet TAKE 100 MG IN THE MORNING AND TAKE 50 MG IN THE EVENING. TAKE  WITH OR IMMEDIATELY FOLLOWING A MEAL. 270 tablet 3  ? Multiple Vitamin (MULTIVITAMIN WITH MINERALS) TABS tablet Take 1 tablet by mouth in the morning. Centrum for Women    ? omeprazole (PRILOSEC) 40 MG capsule TAKE 1 CAPSULE (40 MG TOTAL) BY MOUTH DAILY AS NEEDED (REFLUX). 90 capsule 0  ? predniSONE (DELTASONE) 1 MG tablet Take 4 tablets (4 mg total) by mouth daily with breakfast.    ?  timolol (TIMOPTIC) 0.5 % ophthalmic solution Place 1 drop into both eyes in the morning and at bedtime.    ? traMADol (ULTRAM) 50 MG tablet Take 2 tablets (100 mg total) by mouth every 6 (six) hours as needed. 10 tablet 0  ? triamcinolone cream (KENALOG) 0.1 % APPLY TO AFFECTED AREA TWICE A DAY 90 g 0  ? triamterene-hydrochlorothiazide (DYAZIDE) 37.5-25 MG capsule TAKE 1 EACH (1 CAPSULE TOTAL) BY MOUTH DAILY. 90 capsule 3  ? XARELTO 15 MG TABS tablet TAKE 1 TABLET (15 MG TOTAL) BY MOUTH DAILY WITH SUPPER. 90 tablet 1  ? ?No current facility-administered medications for this visit.  ? ? ?PHYSICAL EXAMINATION: ?ECOG PERFORMANCE STATUS: {CHL ONC ECOG GH:[id number]} ? ?There were no vitals filed for this visit. ?There were no vitals filed for this visit. ? ?BREAST:*** No palpable masses or nodules in either right or left breasts. No palpable axillary supraclavicular or infraclavicular adenopathy no breast tenderness or nipple discharge. (exam performed in the presence of a chaperone) ? ?LABORATORY DATA:  ?I have reviewed the data as listed ?CMP Latest Ref Rng & Units 11/20/2021 05/23/2021 05/11/2021  ?Glucose 70 - 99 mg/dL 104(H) 92 102(H)  ?BUN 6 - 23 mg/dL 25(H) 22 16  ?Creatinine 0.40 - 1.20 mg/dL 1.54(H) 1.42(H) 1.33(H)  ?Sodium 135 - 145 mEq/L 140 142 137  ?Potassium 3.5 - 5.1 mEq/L 3.7 3.7 3.2(L)  ?Chloride 96 - 112 mEq/L 104 104 105  ?CO2 19 - 32 mEq/L 28 26 26   ?Calcium 8.4 - 10.5 mg/dL 9.8 9.1 9.0  ?Total Protein 6.0 - 8.3 g/dL 7.3 - -  ?Total Bilirubin 0.2 - 1.2 mg/dL 1.0 - -  ?Alkaline Phos 39 - 117 U/L 62 - -  ?AST 0 - 37 U/L 19 - -  ?ALT 0 - 35 U/L 13 - -  ? ? ?Lab Results  ?Component Value Date  ? WBC 7.4 11/20/2021  ? HGB 14.9 11/20/2021  ? HCT 46.0 11/20/2021  ? MCV 95.2 11/20/2021  ? PLT 159.0 11/20/2021  ? NEUTROABS 5.7 11/20/2021  ? ? ?ASSESSMENT & PLAN:  ?No problem-specific Assessment & Plan notes found for this encounter. ? ? ? ?No orders of the defined types were placed in this encounter. ? ?The  patient has a good understanding of the overall plan. she agrees with it. she will call with any problems that may develop before the next visit here. ? ?Total time spent: *** mins including face to face time and time spent for planning, charting and coordination of care ? ?Rulon Eisenmenger, MD, MPH ?01/02/2022 ? ?I, Thana Ates, am acting as scribe for Dr. [name]. ? ?{insert scribe attestation} ? ? ? ? ? ?

## 2022-01-03 ENCOUNTER — Inpatient Hospital Stay: Payer: Medicare HMO | Admitting: Hematology and Oncology

## 2022-01-03 ENCOUNTER — Telehealth: Payer: Self-pay | Admitting: Hematology and Oncology

## 2022-01-03 DIAGNOSIS — C50412 Malignant neoplasm of upper-outer quadrant of left female breast: Secondary | ICD-10-CM

## 2022-01-03 NOTE — Telephone Encounter (Signed)
Rescheduled appointment per clinic. Patient aware. ?

## 2022-01-05 NOTE — Progress Notes (Signed)
? ?TELEPHONE VISIT ? ? ?Patient Care Team: ?Laurey Morale, MD as PCP - General ?Josue Hector, MD as PCP - Cardiology (Cardiology) ?Elsie Stain, MD (Pulmonary Disease) ?[name], MD as Consulting Physician (General Surgery) ?[name], MD as Consulting Physician (Hematology and Oncology) ?Kyung Rudd, MD as Consulting Physician (Radiation Oncology) ?Leroy Libman, MD as Consulting Physician (Neurology) ? ?DIAGNOSIS:  ?  ICD-10-CM   ?1. Malignant neoplasm of upper-outer quadrant of left breast in female, estrogen receptor positive ([place])  C50.412   ? Z17.0   ?  ? ? ?SUMMARY OF ONCOLOGIC HISTORY: ?Oncology History  ?Malignant neoplasm of upper-outer quadrant of left breast in female, estrogen receptor positive ([place])  ?01/02/2021 Initial Diagnosis  ? Patient experienced chronic left breast tenderness for 2 years with a negative mammogram last year. Screening mammogram showed a 2.5cm central left breast mass and one enlarged lymph node. US showed a 2.6cm mass at the 12 o'clock position and a 1.3cm mass in the left axillary tail. 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  ?01/04/2021 Cancer Staging  ? Staging form: Breast, AJCC 8th Edition ?- Clinical stage from 01/04/2021: Stage IIIA (cT2, cN1(f), cM0, G3, ER+, PR-, HER2-) - Signed by [name], MD on 01/04/2021 ?Stage prefix: Initial diagnosis ?Method of lymph node assessment: Core biopsy ? ?  ?01/26/2021 - 03/11/2021 Chemotherapy  ? 4 cycles of Taxotere and Cytoxan neoadjuvant chemotherapy ? ? ?  ? ?  ?05/18/2021 Surgery  ? Left lumpectomy with Dr. Donne Hazel: Residual IDC 2.2 cm, DCIS intermediate grade, margins negative, 1/2 lymph nodes positive ER 40% weak, PR negative, HER2 negative, Ki-67 20% ?  ?05/18/2021 Cancer Staging  ? Staging form: Breast, AJCC 8th Edition ?- Pathologic stage from 05/18/2021: No Stage Recommended  (ypT2, pN1a, cM0, G2) - Signed by [name], NP on 10/31/2021 ?Stage prefix: Post-therapy ?Histologic grading system: 3 grade system ? ?  ?06/27/2021 - 08/15/2021 Radiation Therapy  ? Site Technique Total Dose (Gy) Dose per Fx (Gy) Completed Fx Beam Energies  ?Breast, Left: Breast_Lt 3D 50.4/50.4 1.8 28/28 6X, 10X  ?Breast, Left: Breast_Lt_SCLV 3D 50.4/50.4 1.8 28/28 6X, 10X  ?Breast, Left: Breast_Lt_Bst 3D 10/10 2 5/5 6X, 10X  ? ?  ?08/19/2021 - 08/2028 Anti-estrogen oral therapy  ? Letrozole 1 mg daily ?  ? ? ?CHIEF COMPLIANT: Follow-up of left breast cancer ? ?INTERVAL HISTORY: Christina Jacobs is a 81 y.o. with above-mentioned history of breast cancer having undergone chemotherapy. She presents to the clinic today for follow-up.  She has been tolerating anastrozole extremely well without any problems or concerns.  She is very concerned about her risk of breast cancer recurrence and she is awaiting her mammograms to be set up.  She is complaining of back pain which is bothering her and is raising concerns for metastatic disease. ? ?ALLERGIES:  is allergic to codeine, eliquis [apixaban], propoxyphene hcl, meperidine hcl, and tape. ? ?MEDICATIONS:  ?Current Outpatient Medications  ?Medication Sig Dispense Refill  ? albuterol (VENTOLIN HFA) 108 (90 Base) MCG/ACT inhaler Inhale 2 puffs into the lungs every 4 (four) hours as needed for wheezing or shortness of breath. 18 g 5  ? atorvastatin (LIPITOR) 10 MG tablet Take 1 tablet (10 mg total) by mouth daily. 90 tablet 3  ? flecainide (TAMBOCOR) 50 MG tablet TAKE 1 TABLET BY MOUTH TWICE A  DAY 180 tablet 1  ? latanoprost (XALATAN) 0.005 % ophthalmic solution Place 1 drop into both eyes at bedtime.    ? letrozole (FEMARA) 2.5 MG tablet Take 1 tablet (2.5 mg total) by mouth daily. 90 tablet 3  ? levothyroxine (SYNTHROID) 100 MCG tablet Take 1 tablet (100 mcg total) by mouth daily. 90 tablet 3  ? metoprolol succinate (TOPROL-XL) 50 MG 24 hr tablet TAKE 100 MG  IN THE MORNING AND TAKE 50 MG IN THE EVENING. TAKE WITH OR IMMEDIATELY FOLLOWING A MEAL. 270 tablet 3  ? Multiple Vitamin (MULTIVITAMIN WITH MINERALS) TABS tablet Take 1 tablet by mouth in the morning. Centrum for Women    ? omeprazole (PRILOSEC) 40 MG capsule TAKE 1 CAPSULE (40 MG TOTAL) BY MOUTH DAILY AS NEEDED (REFLUX). 90 capsule 0  ? predniSONE (DELTASONE) 1 MG tablet Take 4 tablets (4 mg total) by mouth daily with breakfast.    ? timolol (TIMOPTIC) 0.5 % ophthalmic solution Place 1 drop into both eyes in the morning and at bedtime.    ? traMADol (ULTRAM) 50 MG tablet Take 2 tablets (100 mg total) by mouth every 6 (six) hours as needed. 10 tablet 0  ? triamcinolone cream (KENALOG) 0.1 % APPLY TO AFFECTED AREA TWICE A DAY 90 g 0  ? triamterene-hydrochlorothiazide (DYAZIDE) 37.5-25 MG capsule TAKE 1 EACH (1 CAPSULE TOTAL) BY MOUTH DAILY. 90 capsule 3  ? XARELTO 15 MG TABS tablet TAKE 1 TABLET (15 MG TOTAL) BY MOUTH DAILY WITH SUPPER. 90 tablet 1  ? ?No current facility-administered medications for this visit.  ? ? ?PHYSICAL EXAMINATION: ?ECOG PERFORMANCE STATUS: 1 - Symptomatic but completely ambulatory ? ?  ? ?LABORATORY DATA:  ?I have reviewed the data as listed ?CMP Latest Ref Rng & Units 11/20/2021 05/23/2021 05/11/2021  ?Glucose 70 - 99 mg/dL 104(H) 92 102(H)  ?BUN 6 - 23 mg/dL 25(H) 22 16  ?Creatinine 0.40 - 1.20 mg/dL 1.54(H) 1.42(H) 1.33(H)  ?Sodium 135 - 145 mEq/L 140 142 137  ?Potassium 3.5 - 5.1 mEq/L 3.7 3.7 3.2(L)  ?Chloride 96 - 112 mEq/L 104 104 105  ?CO2 19 - 32 mEq/L _0 ?Calcium 8.4 - 10.5 mg/dL 9.8 9.1 9.0  ?Total Protein 6.0 - 8.3 g/dL 7.3 - -  ?Total Bilirubin 0.2 - 1.2 mg/dL 1.0 - -  ?Alkaline Phos 39 - 117 U/L 62 - -  ?AST 0 - 37 U/L 19 - -  ?ALT 0 - 35 U/L 13 - -  ? ? ?Lab Results  ?Component Value Date  ? WBC 7.4 11/20/2021  ? HGB 14.9 11/20/2021  ? HCT 46.0 11/20/2021  ? MCV 95.2 11/20/2021  ? PLT 159.0 11/20/2021  ? NEUTROABS 5.7 11/20/2021  ? ? ?ASSESSMENT & PLAN:  ?Malignant  neoplasm of upper-outer quadrant of left breast in female, estrogen receptor positive ([place]) ?01/02/2021: Screening mammogram detected left breast mass at 12 o'clock position 2.5 cm spiculated mass.  Abnormal left axillary lymph node 1.3 cm.  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  ?Treatment plan: ?1.  Neoadjuvant chemotherapy with 4 cycles of Taxotere and Cytoxan completed 03/30/2021 ?2. 05/18/2021: Left lumpectomy with Dr. Donne Hazel: Residual IDC 2.2 cm, DCIS intermediate grade, margins negative, 1/2 lymph nodes positive ER 40% weak, PR negative, HER2 negative, Ki-67 20% ?3.  Adjuvant radiation completed  08/14/21 ?4.  Follow-up adjuvant antiestrogen therapy with anastrozole 1 mg daily x7 years started 08/19/21 ?------------------------------------------------------------------------------------------------------------ ? Anastrozole toxicities: denies any side effects ? ?Osteoporosis: Bones T score -2.8: Osteoporosis: Taking calcium and vitamin D, I sent a prescription for Fosamax today. ?Back pain: Concern for breast cancer recurrence: We will obtain CT chest abdomen pelvis with contrast and follow-up after that. ? ?Mammograms: She will need mammograms to be set up in the next month or so. ?Telephone visit in 1 month after the CT and the mammograms were performed to discuss results. ? ? ?No orders of the defined types were placed in this encounter. ? ?The patient has a good understanding of the overall plan. she agrees with it. she will call with any problems that may develop before the next visit here. ? ?Total time spent: 20 mins including face to face time and time spent for planning, charting and coordination of care ? ?Rulon Eisenmenger, MD, MPH ?01/08/2022 ? ?I, Thana Ates, am acting as scribe for Dr. [name]. ? ?I have reviewed the above  documentation for accuracy and completeness, and I agree with the above. ? ? ? ? ? ? ?

## 2022-01-08 ENCOUNTER — Inpatient Hospital Stay: Payer: Medicare HMO | Attending: Hematology and Oncology | Admitting: Hematology and Oncology

## 2022-01-08 DIAGNOSIS — Z9221 Personal history of antineoplastic chemotherapy: Secondary | ICD-10-CM

## 2022-01-08 DIAGNOSIS — C50412 Malignant neoplasm of upper-outer quadrant of left female breast: Secondary | ICD-10-CM | POA: Diagnosis not present

## 2022-01-08 DIAGNOSIS — Z170 Estrogen receptor positive status [ER+]: Secondary | ICD-10-CM | POA: Diagnosis not present

## 2022-01-08 DIAGNOSIS — M810 Age-related osteoporosis without current pathological fracture: Secondary | ICD-10-CM

## 2022-01-08 DIAGNOSIS — Z923 Personal history of irradiation: Secondary | ICD-10-CM

## 2022-01-08 DIAGNOSIS — Z79811 Long term (current) use of aromatase inhibitors: Secondary | ICD-10-CM | POA: Diagnosis not present

## 2022-01-08 MED ORDER — ALENDRONATE SODIUM 70 MG PO TABS: 70.0000 mg | ORAL_TABLET | ORAL | 3 refills | Status: DC

## 2022-01-08 NOTE — Assessment & Plan Note (Signed)
01/02/2021: Screening mammogram detected left breast mass at 12 o'clock position 2.5 cm spiculated mass. ?Abnormal left axillary lymph node 1.3 cm. 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 ?Treatment plan: ?1.??Neoadjuvant chemotherapy with 4 cycles of Taxotere and Cytoxan?completed 03/30/2021 ?2.?05/18/2021: Left lumpectomy with Dr. Donne Hazel: Residual IDC 2.2 cm, DCIS intermediate grade, margins negative, 1/2 lymph nodes positive ER 40% weak, PR negative, HER2 negative, Ki-67 20% ?3.??Adjuvant radiation completing 08/14/21 ?4.??Follow-up adjuvant antiestrogen therapy with anastrozole 1 mg daily x7 years to start 08/19/21 ?------------------------------------------------------------------------------------------------------------ ??Anastrozole toxicities: ? ?Osteoporosis: Bones T score -2.8: Osteoporosis ?Recommended bisphosphonate therapy ?

## 2022-01-10 ENCOUNTER — Telehealth: Payer: Self-pay | Admitting: Hematology and Oncology

## 2022-01-10 NOTE — Telephone Encounter (Signed)
Scheduled appointment per 3/6 los. Patient is aware. ?

## 2022-01-16 ENCOUNTER — Telehealth: Payer: Self-pay

## 2022-01-16 NOTE — Telephone Encounter (Signed)
Called and spoke with pt, reminded her of CT scan that needs to be scheduled and provided her with our central scheduling number so that she could complete this by early April.  Pt verbalized understanding and thanks  ?

## 2022-02-01 ENCOUNTER — Other Ambulatory Visit: Payer: Self-pay | Admitting: Cardiovascular Disease

## 2022-02-01 ENCOUNTER — Other Ambulatory Visit: Payer: Self-pay | Admitting: Family Medicine

## 2022-02-01 LAB — HM MAMMOGRAPHY

## 2022-02-01 NOTE — Telephone Encounter (Signed)
Prescription refill request for Xarelto received.  ?Indication: Afib  ?Last office visit:11/22/21 Johnsie Cancel)  ?Weight:69.9kg ?Age:81 ?Scr:1.54 (11/20/21) ?CrCl: 32.49m/min ? ?Appropriate dose and refill sent to requested pharmacy.  ?

## 2022-02-02 ENCOUNTER — Ambulatory Visit (HOSPITAL_COMMUNITY)
Admission: RE | Admit: 2022-02-02 | Discharge: 2022-02-02 | Disposition: A | Discharge status: Home / Self Care | Payer: Medicare HMO | Source: Ambulatory Visit | Attending: Hematology and Oncology | Admitting: Hematology and Oncology

## 2022-02-02 DIAGNOSIS — Z170 Estrogen receptor positive status [ER+]: Secondary | ICD-10-CM | POA: Diagnosis present

## 2022-02-02 DIAGNOSIS — C50412 Malignant neoplasm of upper-outer quadrant of left female breast: Secondary | ICD-10-CM | POA: Diagnosis not present

## 2022-02-02 LAB — POCT I-STAT CREATININE: Creatinine, Ser: 1.6 mg/dL — ABNORMAL HIGH (ref 0.44–1.00)

## 2022-02-02 MED ORDER — IOHEXOL 9 MG/ML PO SOLN
500.0000 mL | ORAL | Status: AC
  Administered 2022-02-02 (×2): 500 mL via ORAL

## 2022-02-02 MED ORDER — SODIUM CHLORIDE (PF) 0.9 % IJ SOLN
INTRAMUSCULAR | Status: AC
  Filled 2022-02-02: qty 50

## 2022-02-02 MED ORDER — IOHEXOL 300 MG/ML  SOLN
100.0000 mL | Freq: Once | INTRAMUSCULAR | Status: AC | PRN
  Administered 2022-02-02: 70 mL via INTRAVENOUS

## 2022-02-07 ENCOUNTER — Other Ambulatory Visit: Payer: Self-pay

## 2022-02-07 ENCOUNTER — Telehealth: Payer: Self-pay | Admitting: Hematology and Oncology

## 2022-02-07 ENCOUNTER — Encounter: Payer: Self-pay | Admitting: Hematology and Oncology

## 2022-02-07 DIAGNOSIS — Z170 Estrogen receptor positive status [ER+]: Secondary | ICD-10-CM

## 2022-02-07 NOTE — Progress Notes (Signed)
Attempted to call pt to schedule transvaginal US per MD. Pt did not answer, no VM. Called husband's cell phone and LVM for call back.  ?

## 2022-02-07 NOTE — Telephone Encounter (Signed)
I discussed the CT findings with the patient showing the increase in size of the ovarian cyst. ?We will request to our GYN oncology team to see her for examination to determine the appropriate course of action. ?

## 2022-02-08 ENCOUNTER — Telehealth: Payer: Self-pay | Admitting: Oncology

## 2022-02-08 NOTE — Telephone Encounter (Signed)
Called Christina Jacobs and advised her of appointment for pelvic ultrasound on 02/14/22.  Also discussed we will call her back with an appointment to see Dr. Berline Lopes.  She verbalized understanding and agreement. ?

## 2022-02-08 NOTE — Progress Notes (Signed)
HEMATOLOGY-ONCOLOGY TELEPHONE VISIT PROGRESS NOTE ? ?I connected with Christina Jacobs on 02/20/22 at 10:30 AM EDT by telephone and verified that I am speaking with the correct person using two identifiers.  ?I discussed the limitations, risks, security and privacy concerns of performing an evaluation and management service by telephone and the availability of in person appointments.  ?I also discussed with the patient that there may be a patient responsible charge related to this service. The patient expressed understanding and agreed to proceed.  ? ?CHIEF COMPLIANT: Follow-up of left breast cancer ?  ?History of Present Illness: 81 y.o. with above-mentioned history of breast cancer having undergone chemotherapy. She presents to the clinic today via telephone for follow-up. Complains of diarrhea 2-3 loose stools. ? ?Oncology History  ?Malignant neoplasm of upper-outer quadrant of left breast in female, estrogen receptor positive ([place])  ?01/02/2021 Initial Diagnosis  ? Patient experienced chronic left breast tenderness for 2 years with a negative mammogram last year. Screening mammogram showed a 2.5cm central left breast mass and one enlarged lymph node. US showed a 2.6cm mass at the 12 o'clock position and a 1.3cm mass in the left axillary tail. 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  ?01/04/2021 Cancer Staging  ? Staging form: Breast, AJCC 8th Edition ?- Clinical stage from 01/04/2021: Stage IIIA (cT2, cN1(f), cM0, G3, ER+, PR-, HER2-) - Signed by [name], MD on 01/04/2021 ?Stage prefix: Initial diagnosis ?Method of lymph node assessment: Core biopsy ? ?  ?01/26/2021 - 03/11/2021 Chemotherapy  ? 4 cycles of Taxotere and Cytoxan neoadjuvant chemotherapy ? ? ?  ? ?  ?05/18/2021 Surgery  ? Left lumpectomy with Dr. Donne Hazel: Residual IDC 2.2 cm, DCIS intermediate grade, margins negative, 1/2 lymph  nodes positive ER 40% weak, PR negative, HER2 negative, Ki-67 20% ?  ?05/18/2021 Cancer Staging  ? Staging form: Breast, AJCC 8th Edition ?- Pathologic stage from 05/18/2021: No Stage Recommended (ypT2, pN1a, cM0, G2) - Signed by [name], NP on 10/31/2021 ?Stage prefix: Post-therapy ?Histologic grading system: 3 grade system ? ?  ?06/27/2021 - 08/15/2021 Radiation Therapy  ? Site Technique Total Dose (Gy) Dose per Fx (Gy) Completed Fx Beam Energies  ?Breast, Left: Breast_Lt 3D 50.4/50.4 1.8 28/28 6X, 10X  ?Breast, Left: Breast_Lt_SCLV 3D 50.4/50.4 1.8 28/28 6X, 10X  ?Breast, Left: Breast_Lt_Bst 3D 10/10 2 5/5 6X, 10X  ? ?  ?08/19/2021 - 08/2028 Anti-estrogen oral therapy  ? Letrozole 1 mg daily ?  ? ? ?REVIEW OF SYSTEMS:   ?Intermittent loose stools and diarrhea ?All other systems were reviewed with the patient and are negative. ?Observations/Objective:  ? ?  ?Assessment Plan:  ?Malignant neoplasm of upper-outer quadrant of left breast in female, estrogen receptor positive ([place]) ?01/02/2021: Screening mammogram detected left breast mass at 12 o'clock position 2.5 cm spiculated mass.  Abnormal left axillary lymph node 1.3 cm.  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  ?Treatment plan: ?1.  Neoadjuvant chemotherapy with 4 cycles of Taxotere and Cytoxan completed 03/30/2021 ?2. 05/18/2021: Left lumpectomy with Dr. Donne Hazel: Residual IDC 2.2 cm, DCIS intermediate grade, margins negative, 1/2 lymph nodes positive ER 40% weak, PR negative, HER2 negative, Ki-67 20% ?3.  Adjuvant  radiation completed 08/14/21 ?4.  Follow-up adjuvant antiestrogen therapy with letrozole 1 mg daily x7 years started 08/19/21 ?------------------------------------------------------------------------------------------------------------ ? letrozole toxicities: denies any  side effects ?  ?Intermittent loose stools: Patient is unsure if it is related to letrozole.  I instructed her to stop letrozole for a week and see what happens. ?If it is letrozole related then we can switch her to anastrozole. ?If it is not letrozole related that I instructed her to take fiber containing foods as well as Metamucil. ? ?Osteoporosis: Bones T score -2.8: Osteoporosis: Taking calcium and vitamin D, I sent a prescription for Fosamax today. ?02/03/2022: CT CAP: No evidence of metastatic disease.  Interval increase in the right ovarian cyst 4.9 cm ?Send a referral to Dr. Berline Lopes who saw the patient with a plan to perform pelvic ultrasound in 3 to 6 months and a CA125. ? ?Breast cancer surveillance: ?1.  Mammogram 02/01/2022: [facility]: Benign, breast density category C ? ?Patient has an appointment in July for follow-up with me. ? ? ? ?I discussed the assessment and treatment plan with the patient. The patient was provided an opportunity to ask questions and all were answered. The patient agreed with the plan and demonstrated an understanding of the instructions. The patient was advised to call back or seek an in-person evaluation if the symptoms worsen or if the condition fails to improve as anticipated.  ? ?I provided 12 minutes of non-face-to-face time during this encounter. Harriette Ohara, MD   ?Earlie Server am scribing for Dr. Lindi Adie ? ?I have reviewed the above documentation for accuracy and completeness, and I agree with the above. ?  ?

## 2022-02-08 NOTE — Telephone Encounter (Signed)
Called Christina Jacobs and advised her of appointment to see Dr. Berline Lopes on 02/19/22 at 10:30.  Advised her to arrive 20 minutes early to check in.  She verbalized understanding and agreement. ?

## 2022-02-13 ENCOUNTER — Encounter (HOSPITAL_COMMUNITY): Payer: Self-pay

## 2022-02-14 ENCOUNTER — Ambulatory Visit (HOSPITAL_COMMUNITY)
Admission: RE | Admit: 2022-02-14 | Discharge: 2022-02-14 | Disposition: A | Discharge status: Home / Self Care | Payer: Medicare HMO | Source: Ambulatory Visit | Attending: Hematology and Oncology | Admitting: Hematology and Oncology

## 2022-02-14 DIAGNOSIS — Z170 Estrogen receptor positive status [ER+]: Secondary | ICD-10-CM | POA: Insufficient documentation

## 2022-02-14 DIAGNOSIS — Z90710 Acquired absence of both cervix and uterus: Secondary | ICD-10-CM | POA: Insufficient documentation

## 2022-02-14 DIAGNOSIS — N83291 Other ovarian cyst, right side: Secondary | ICD-10-CM | POA: Insufficient documentation

## 2022-02-14 DIAGNOSIS — C50412 Malignant neoplasm of upper-outer quadrant of left female breast: Secondary | ICD-10-CM | POA: Insufficient documentation

## 2022-02-16 ENCOUNTER — Encounter: Payer: Self-pay | Admitting: Gynecologic Oncology

## 2022-02-19 ENCOUNTER — Inpatient Hospital Stay: Payer: Medicare HMO

## 2022-02-19 ENCOUNTER — Encounter: Payer: Self-pay | Admitting: Gynecologic Oncology

## 2022-02-19 ENCOUNTER — Inpatient Hospital Stay: Payer: Medicare HMO | Attending: Hematology and Oncology | Admitting: Gynecologic Oncology

## 2022-02-19 ENCOUNTER — Other Ambulatory Visit: Payer: Self-pay

## 2022-02-19 VITALS — BP 175/81 | HR 68 | Temp 98.0°F | Resp 18 | Ht 64.0 in | Wt 153.0 lb

## 2022-02-19 DIAGNOSIS — G7000 Myasthenia gravis without (acute) exacerbation: Secondary | ICD-10-CM | POA: Insufficient documentation

## 2022-02-19 DIAGNOSIS — N183 Chronic kidney disease, stage 3 unspecified: Secondary | ICD-10-CM | POA: Diagnosis not present

## 2022-02-19 DIAGNOSIS — K449 Diaphragmatic hernia without obstruction or gangrene: Secondary | ICD-10-CM | POA: Insufficient documentation

## 2022-02-19 DIAGNOSIS — I4891 Unspecified atrial fibrillation: Secondary | ICD-10-CM | POA: Insufficient documentation

## 2022-02-19 DIAGNOSIS — K219 Gastro-esophageal reflux disease without esophagitis: Secondary | ICD-10-CM | POA: Insufficient documentation

## 2022-02-19 DIAGNOSIS — I10 Essential (primary) hypertension: Secondary | ICD-10-CM | POA: Insufficient documentation

## 2022-02-19 DIAGNOSIS — Z79899 Other long term (current) drug therapy: Secondary | ICD-10-CM | POA: Diagnosis not present

## 2022-02-19 DIAGNOSIS — M810 Age-related osteoporosis without current pathological fracture: Secondary | ICD-10-CM | POA: Insufficient documentation

## 2022-02-19 DIAGNOSIS — N83201 Unspecified ovarian cyst, right side: Secondary | ICD-10-CM

## 2022-02-19 DIAGNOSIS — E785 Hyperlipidemia, unspecified: Secondary | ICD-10-CM | POA: Insufficient documentation

## 2022-02-19 DIAGNOSIS — Z170 Estrogen receptor positive status [ER+]: Secondary | ICD-10-CM | POA: Insufficient documentation

## 2022-02-19 DIAGNOSIS — D3911 Neoplasm of uncertain behavior of right ovary: Secondary | ICD-10-CM | POA: Diagnosis present

## 2022-02-19 DIAGNOSIS — C50412 Malignant neoplasm of upper-outer quadrant of left female breast: Secondary | ICD-10-CM | POA: Insufficient documentation

## 2022-02-19 DIAGNOSIS — Z90710 Acquired absence of both cervix and uterus: Secondary | ICD-10-CM | POA: Diagnosis not present

## 2022-02-19 DIAGNOSIS — Z79811 Long term (current) use of aromatase inhibitors: Secondary | ICD-10-CM | POA: Insufficient documentation

## 2022-02-19 DIAGNOSIS — Z7901 Long term (current) use of anticoagulants: Secondary | ICD-10-CM | POA: Insufficient documentation

## 2022-02-19 DIAGNOSIS — M1990 Unspecified osteoarthritis, unspecified site: Secondary | ICD-10-CM | POA: Insufficient documentation

## 2022-02-19 NOTE — Progress Notes (Signed)
GYNECOLOGIC ONCOLOGY NEW PATIENT CONSULTATION  ? ?Patient Name: RAYNIE STEINHAUS  ?Patient Age: 81 y.o. ?Date of Service: 02/19/22 ?Referring Provider: [name], MD ? ?Primary Care Provider: Laurey Morale, MD ?Consulting Provider: Jeral Pinch, MD  ? ?Assessment/Plan:  ?Postmenopausal patient with recent diagnosis of breast cancer now on systemic therapy with an aromatase inhibitor presenting in the setting of an adnexal mass. ? ?I discussed with the patient and her husband findings from recent imaging including pelvic ultrasound and CT scan.  We looked at both images together.  CT scan shows what is described as enlargement of a right ovarian cyst.  When I look back at her CT scan from November 2019, it is somewhat difficult, but I do think I appreciate a smaller cyst in that adnexa.  Patient did not have pelvic ultrasound at that time.  Her recent pelvic ultrasound shows a very simple appearing cyst on the right without concerning ultrasound features.  If the cyst was in fact there in 2019, it has grown approximately 2 cm over the course of 3-1/2 years.  There are no findings of metastatic disease on her CT scan. ? ?While ovarian cysts are considered abnormal and menopause given quiescent ovaries, we discussed benign cyst do happen.  The only way to definitively make a diagnosis is with surgical excision so that we can look at the cyst under the microscope.  The patient is completely asymptomatic at this time.  Given imaging findings on ultrasound, which confirmed that this appears to be a simple cyst, I think the likelihood of malignancy is quite low.  In the setting of her multiple comorbidities, surgical history, and large hiatal hernia, the patient does have some morbidity related to surgery. ? ?My recommendation is that we proceed with obtaining a CA-125 today.  We discussed the difficulty with this test as it is not approved for diagnosis.  Up to 50% of women can have a normal CA-125 and early stage  ovarian cancer and it can be elevated in many other disease processes.  Although not diagnostic, if this tumor marker is normal, I think this further supports a benign process of the ovary.  My recommendation would be that we proceed with repeat imaging, using a pelvic MRI, in 3-6 months.  Patient asked about other forms of imaging the pelvis.  I discussed the limitations of CT scan with regard to ovarian assessment.  We could use a pelvic MRI, although this incurs a higher cost and requires laying in a confined space for a longer period of time.  Patient's preference was to proceed with a pelvic ultrasound in 3-6 months.  This was scheduled today and we will plan to have a phone visit after. ? ?I will call the patient when I get her blood work back from today. ? ?A copy of this note was sent to the patient's referring provider.  ? ?55 minutes of total time was spent for this patient encounter, including preparation, face-to-face counseling with the patient and coordination of care, and documentation of the encounter. ? ? ?Jeral Pinch, MD  ?Division of Gynecologic Oncology  ?Department of Obstetrics and Gynecology  ?[facility]  ?___________________________________________  ?Chief Complaint: ?Chief Complaint  ?Patient presents with  ? Cyst of right ovary  ? ? ?History of Present Illness:  ?COTINA FREEDMAN is a 81 y.o. y.o. female who is seen in consultation at the request of Dr. Lindi Adie for an evaluation of an adnexal mass. ? ?Patient underwent  CT imaging in the setting of her new diagnosis of breast cancer.  On CT in late March, there was interval enlargement of a right ovarian cyst now measuring 4.9 x 4.7 cm, previously 2.7 x 1.9 cm.  Follow-up pelvic ultrasound exam on 02/14/2022 showed left ovary measuring up to 5.1 cm with a 4.9 x 3.9 x 3.8 cm simple right ovarian cyst.  No internal complexity, vascularity, or solid nodularity. ? ?Patient reports overall feeling well.  She has  had some diarrhea since starting letrozole which she thinks is a side effect from the medication.  Her bowel function is now solid but comes out in pieces.  She denies any urinary symptoms.  She endorses a good appetite without nausea or emesis. ? ?Patient underwent hysterectomy in 1987 for fibroids.  She is unsure whether the fallopian tubes were removed but knows that her ovaries were both left in place.  She cannot remember when she had menopausal symptoms but her husband thinks that this began in her late 78s.  She denies any vaginal bleeding after surgery, denies any pelvic pain. ? ?Medical history is notable for myasthenia gravis, breast cancer, large hiatal hernia which causes shortness of breath as well as GERD, hypertension, A-fib, thyroid disease.  In 2011, attempt was made at reducing her hiatal hernia but this was aborted given that her whole stomach was above the diaphragm.  She has seen a surgeon subsequently who also agreed that the risk of surgery was too great. ? ?Treatment History: ?Oncology History  ?Malignant neoplasm of upper-outer quadrant of left breast in female, estrogen receptor positive ([place])  ?01/02/2021 Initial Diagnosis  ? Patient experienced chronic left breast tenderness for 2 years with a negative mammogram last year. Screening mammogram showed a 2.5cm central left breast mass and one enlarged lymph node. US showed a 2.6cm mass at the 12 o'clock position and a 1.3cm mass in the left axillary tail. 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  ?01/04/2021 Cancer Staging  ? Staging form: Breast, AJCC 8th Edition ?- Clinical stage from 01/04/2021: Stage IIIA (cT2, cN1(f), cM0, G3, ER+, PR-, HER2-) - Signed by [name], MD on 01/04/2021 ?Stage prefix: Initial diagnosis ?Method of lymph node assessment: Core biopsy ? ?  ?01/26/2021 - 03/11/2021 Chemotherapy  ? 4 cycles  of Taxotere and Cytoxan neoadjuvant chemotherapy ? ? ?  ? ?  ?05/18/2021 Surgery  ? Left lumpectomy with Dr. Donne Hazel: Residual IDC 2.2 cm, DCIS intermediate grade, margins negative, 1/2 lymph nodes positive ER 40% weak, PR negative, HER2 negative, Ki-67 20% ?  ?05/18/2021 Cancer Staging  ? Staging form: Breast, AJCC 8th Edition ?- Pathologic stage from 05/18/2021: No Stage Recommended (ypT2, pN1a, cM0, G2) - Signed by [name], NP on 10/31/2021 ?Stage prefix: Post-therapy ?Histologic grading system: 3 grade system ? ?  ?06/27/2021 - 08/15/2021 Radiation Therapy  ? Site Technique Total Dose (Gy) Dose per Fx (Gy) Completed Fx Beam Energies  ?Breast, Left: Breast_Lt 3D 50.4/50.4 1.8 28/28 6X, 10X  ?Breast, Left: Breast_Lt_SCLV 3D 50.4/50.4 1.8 28/28 6X, 10X  ?Breast, Left: Breast_Lt_Bst 3D 10/10 2 5/5 6X, 10X  ? ?  ?08/19/2021 - 08/2028 Anti-estrogen oral therapy  ? Letrozole 1 mg daily ?  ? ? ?PAST MEDICAL HISTORY:  ?Past Medical History:  ?Diagnosis Date  ? Adenomatous colon polyp   ? Anal fissure   ?  Anxiety   ? Asthma   ? Dr. Halford Chessman  ? Atrial fibrillation ([place])   ? Bowel obstruction ([place])   ? Bursitis   ? CKD (chronic kidney disease) stage 3, GFR 30-59 ml/min (HCC)   ? sees Dr. [name]   ? Complication of anesthesia   ? post op N/V  ? Diverticulosis   ? Dyspnea   ? After a meal  ? Dysrhythmia   ? Afib  ? GERD (gastroesophageal reflux disease)   ? Glaucoma   ? Hiatal hernia   ? Giant type IV hiatal hernia; sees Dr. [name] at [facility]  ? Hyperlipidemia   ? Hypertension   ? Hypothyroidism   ? Myasthenia gravis [facility]) 2013  ? followed at [facility] Dr Scheryl Marten, diplopia only  ? Osteoarthritis   ? see's Dr. [name]  ? Osteoporosis   ? last DEXA 08-03-11  ?  ? ?PAST SURGICAL HISTORY:  ?Past Surgical History:  ?Procedure Laterality Date  ? ABDOMINAL HYSTERECTOMY    ? APPENDECTOMY    ? BREAST LUMPECTOMY Bilateral   ?  several, enign breast lumps removed  ? BREAST LUMPECTOMY WITH RADIOACTIVE SEED  AND SENTINEL LYMPH NODE BIOPSY Left 05/18/2021  ? Procedure: LEFT BREAST LUMPECTOMY WITH RADIOACTIVE SEED AND LEFT AXILLARY SENTINEL LYMPH NODE BIOPSY;  Surgeon: [name], MD;  Location: [place]

## 2022-02-19 NOTE — Patient Instructions (Addendum)
It was a pleasure to meet you today.  As we discussed, cyst on her near your ovary is simple in appearance without features that raise the concern for malignancy.  We will get a blood test today.  While this is not diagnostic, if normal, will provide further reassurance that this is a benign cyst. ? ?As we discussed, cyst on the ovary are technically abnormal in the setting of menopause because the ovary should be quiet.  Given this, as well as the fact that this is the first time that we are seeing the cyst since her CT scan in 2019, I recommend that we get repeat imaging.  We will plan to get an ultrasound in 6 months.  You and I will have a phone visit shortly after to discuss results and if any other testing is needed. ? ?In the meantime, if you develop any symptoms, please call the clinic.  The symptoms would include pelvic pain, abdominal bloating, feeling full quickly, change to bowel function, or unintentional weight loss. ?

## 2022-02-20 ENCOUNTER — Inpatient Hospital Stay (HOSPITAL_BASED_OUTPATIENT_CLINIC_OR_DEPARTMENT_OTHER): Payer: Medicare HMO | Admitting: Hematology and Oncology

## 2022-02-20 DIAGNOSIS — C50412 Malignant neoplasm of upper-outer quadrant of left female breast: Secondary | ICD-10-CM | POA: Diagnosis not present

## 2022-02-20 DIAGNOSIS — Z170 Estrogen receptor positive status [ER+]: Secondary | ICD-10-CM

## 2022-02-20 LAB — CA 125: Cancer Antigen (CA) 125: 27.6 U/mL (ref 0.0–38.1)

## 2022-02-20 NOTE — Assessment & Plan Note (Signed)
01/02/2021: Screening mammogram detected left breast mass at 12 o'clock position 2.5 cm spiculated mass. ?Abnormal left axillary lymph node 1.3 cm. 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 ?Treatment plan: ?1.??Neoadjuvant chemotherapy with 4 cycles of Taxotere and Cytoxan?completed 03/30/2021 ?2.?05/18/2021: Left lumpectomy with Dr. Donne Hazel: Residual IDC 2.2 cm, DCIS intermediate grade, margins negative, 1/2 lymph nodes positive ER 40% weak, PR negative, HER2 negative, Ki-67 20% ?3.??Adjuvant radiation?completed 08/14/21 ?4.??Follow-up adjuvant antiestrogen therapy with anastrozole 1 mg daily x7 years?started 08/19/21 ?------------------------------------------------------------------------------------------------------------ ??Anastrozole toxicities: denies any side effects ?? ?Osteoporosis: Bones T score -2.8: Osteoporosis: Taking calcium and vitamin D, I sent a prescription for Fosamax today. ?02/03/2022: CT CAP: No evidence of metastatic disease.  Interval increase in the right ovarian cyst 4.9 cm ?Send a referral to Dr. Berline Lopes who saw the patient with a plan to perform pelvic ultrasound in 3 to 6 months and a CA125. ? ?Breast cancer surveillance: ?1.  Mammogram 02/01/2022: [facility]: Benign, breast density category C ? ?Return to clinic in 6 months for breast exam and follow-up ?

## 2022-02-26 ENCOUNTER — Ambulatory Visit: Payer: Medicare HMO | Attending: General Surgery

## 2022-02-26 VITALS — Wt 154.0 lb

## 2022-02-26 DIAGNOSIS — Z483 Aftercare following surgery for neoplasm: Secondary | ICD-10-CM | POA: Insufficient documentation

## 2022-02-26 NOTE — Therapy (Signed)
?OUTPATIENT PHYSICAL THERAPY SOZO SCREENING NOTE ? ? ?Patient Name: Christina Jacobs ?MRN: [id number] ?DOB:[birth year], 81 y.o., female ?Today's Date: 02/26/2022 ? ?PCP: Laurey Morale, MD ?REFERRING PROVIDER: [name], MD ? ? PT End of Session - 02/26/22 1616   ? ? Visit Number 5   # unchanged due to screen only  ? PT Start Time 1615   ? PT Stop Time 2876   ? PT Time Calculation (min) 4 min   ? Activity Tolerance Patient tolerated treatment well   ? Behavior During Therapy [facility] for tasks assessed/performed   ? ?  ?  ? ?  ? ? ?Past Medical History:  ?Diagnosis Date  ? Adenomatous colon polyp   ? Anal fissure   ? Anxiety   ? Asthma   ? Dr. Halford Chessman  ? Atrial fibrillation ([place])   ? Bowel obstruction ([place])   ? Bursitis   ? CKD (chronic kidney disease) stage 3, GFR 30-59 ml/min (HCC)   ? sees Dr. [name]   ? Complication of anesthesia   ? post op N/V  ? Diverticulosis   ? Dyspnea   ? After a meal  ? Dysrhythmia   ? Afib  ? GERD (gastroesophageal reflux disease)   ? Glaucoma   ? Hiatal hernia   ? Giant type IV hiatal hernia; sees Dr. [name] at [facility]  ? Hyperlipidemia   ? Hypertension   ? Hypothyroidism   ? Myasthenia gravis [facility]) 2013  ? followed at [facility] Dr Scheryl Marten, diplopia only  ? Osteoarthritis   ? see's Dr. [name]  ? Osteoporosis   ? last DEXA 08-03-11  ? ?Past Surgical History:  ?Procedure Laterality Date  ? ABDOMINAL HYSTERECTOMY    ? APPENDECTOMY    ? BREAST LUMPECTOMY Bilateral   ?  several, enign breast lumps removed  ? BREAST LUMPECTOMY WITH RADIOACTIVE SEED AND SENTINEL LYMPH NODE BIOPSY Left 05/18/2021  ? Procedure: LEFT BREAST LUMPECTOMY WITH RADIOACTIVE SEED AND LEFT AXILLARY SENTINEL LYMPH NODE BIOPSY;  Surgeon: [name], MD;  Location: [place];  Service: General;  Laterality: Left;  ? CARDIOVERSION N/A 12/31/2017  ? Procedure: CARDIOVERSION;  Surgeon: Josue Hector, MD;  Location: [facility] ENDOSCOPY;  Service: Cardiovascular;  Laterality: N/A;  ? CARDIOVERSION N/A 02/11/2018   ? Procedure: CARDIOVERSION;  Surgeon: Josue Hector, MD;  Location: [facility] ENDOSCOPY;  Service: Cardiovascular;  Laterality: N/A;  ? COLONOSCOPY  09/27/2010  ? per Dr. Sharlett Iles, benign polyp, no repeats needed   ? EYE SURGERY    ? HERNIA REPAIR  10/18/2010  ? Dr. Hassell Done attempted but could reduce this, the entire stomach is above the diaphragm  ? PORT-A-CATH REMOVAL N/A 05/18/2021  ? Procedure: REMOVAL PORT-A-CATH;  Surgeon: [name], MD;  Location: [place];  Service: General;  Laterality: N/A;  ? PORTACATH PLACEMENT Right 01/25/2021  ? Procedure: INSERTION PORT-A-CATH;  Surgeon: [name], MD;  Location: [facility];  Service: General;  Laterality: Right;  START TIME OF 8:30 AM FOR 60 MINUTES ROOM 4 [place]  ? RADIOACTIVE SEED GUIDED AXILLARY SENTINEL LYMPH NODE Left 05/18/2021  ? Procedure: RADIOACTIVE SEED GUIDED LEFT AXILLARY NODE EXCISION;  Surgeon: [name], MD;  Location: [place];  Service: General;  Laterality: Left;  ? THYROIDECTOMY, PARTIAL  1999  ? TONSILLECTOMY    ? ?Patient Active Problem List  ? Diagnosis Date Noted  ? Port-A-Cath in place 01/26/2021  ? Malignant neoplasm of upper-outer quadrant of left breast in female, estrogen receptor positive (  [place]) 01/02/2021  ? Paroxysmal atrial fibrillation ([place]) 08/19/2019  ? CKD (chronic kidney disease) stage 3, GFR 30-59 ml/min (HCC) 08/09/2017  ? Hiatal hernia 08/09/2017  ? Glaucoma 08/08/2016  ? Restrictive lung disease 09/29/2013  ? Myasthenia gravis ([place]) 07/14/2012  ? HERNIA 07/17/2010  ? DIVERTICULOSIS, COLON 07/14/2010  ? 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 EXTERNAL HEMORRHOIDS 07/13/2009  ? GERD 02/16/2009  ? Osteoarthrosis, unspecified whether generalized or localized, unspecified site 07/01/2008  ? MITRAL VALVE PROLAPSE 11/17/2007  ? Hypothyroidism 07/16/2007  ? Dyslipidemia 07/16/2007  ? Essential hypertension 07/16/2007  ? Osteoporosis 07/16/2007  ? ? ?REFERRING DIAG: left  breast cancer at risk for lymphedema ? ?THERAPY DIAG:  ?Aftercare following surgery for neoplasm ? ?PERTINENT HISTORY: Patient was diagnosed on 12/06/2020 with left grade III invasive ductal carcinoma breast cancer. She underwent neoadjuvant chemotherapy from 01/26/2021-03/11/2021 followed by a left lumpectomy and sentinel node biopsy on 05/18/2021 with 1 of 2 lymph nodes being positive for cancer. It is ER positive, PR negative, and HER2 negative with a Ki67 of 20%. She has a hiatal hernia which is severe and limits her ability to exercise.  ? ?PRECAUTIONS: left UE Lymphedema risk, None ? ?SUBJECTIVE: Pt returns for her 3 month L-Dex screen.  ? ?PAIN:  ?Are you having pain? No ? ?SOZO SCREENING: ?Patient was assessed today using the SOZO machine to determine the lymphedema index score. This was compared to her baseline score. It was determined that she is within the recommended range when compared to her baseline and no further action is needed at this time. She will continue SOZO screenings. These are done every 3 months for 2 years post operatively followed by every 6 months for 2 years, and then annually. ? ? ? ?Otelia Limes, PTA ?02/26/2022, 4:22 PM ? ?  ? ?

## 2022-03-02 ENCOUNTER — Encounter: Payer: Self-pay | Admitting: Family Medicine

## 2022-04-24 ENCOUNTER — Other Ambulatory Visit: Payer: Self-pay | Admitting: Family Medicine

## 2022-04-24 ENCOUNTER — Other Ambulatory Visit: Payer: Self-pay | Admitting: Cardiovascular Disease

## 2022-04-27 ENCOUNTER — Telehealth: Payer: Self-pay | Admitting: Hematology and Oncology

## 2022-05-10 ENCOUNTER — Ambulatory Visit: Payer: Medicare HMO | Admitting: Hematology and Oncology

## 2022-05-15 ENCOUNTER — Inpatient Hospital Stay: Payer: Medicare HMO | Attending: Hematology and Oncology | Admitting: Hematology and Oncology

## 2022-05-15 ENCOUNTER — Other Ambulatory Visit: Payer: Self-pay

## 2022-05-15 DIAGNOSIS — Z79811 Long term (current) use of aromatase inhibitors: Secondary | ICD-10-CM | POA: Insufficient documentation

## 2022-05-15 DIAGNOSIS — I4891 Unspecified atrial fibrillation: Secondary | ICD-10-CM | POA: Insufficient documentation

## 2022-05-15 DIAGNOSIS — Z9221 Personal history of antineoplastic chemotherapy: Secondary | ICD-10-CM | POA: Diagnosis not present

## 2022-05-15 DIAGNOSIS — C773 Secondary and unspecified malignant neoplasm of axilla and upper limb lymph nodes: Secondary | ICD-10-CM | POA: Diagnosis not present

## 2022-05-15 DIAGNOSIS — M810 Age-related osteoporosis without current pathological fracture: Secondary | ICD-10-CM | POA: Diagnosis not present

## 2022-05-15 DIAGNOSIS — Z923 Personal history of irradiation: Secondary | ICD-10-CM | POA: Diagnosis not present

## 2022-05-15 DIAGNOSIS — Z170 Estrogen receptor positive status [ER+]: Secondary | ICD-10-CM

## 2022-05-15 DIAGNOSIS — N83201 Unspecified ovarian cyst, right side: Secondary | ICD-10-CM | POA: Diagnosis not present

## 2022-05-15 DIAGNOSIS — Z79899 Other long term (current) drug therapy: Secondary | ICD-10-CM | POA: Diagnosis not present

## 2022-05-15 DIAGNOSIS — C50412 Malignant neoplasm of upper-outer quadrant of left female breast: Secondary | ICD-10-CM | POA: Diagnosis present

## 2022-05-15 NOTE — Assessment & Plan Note (Signed)
01/02/2021: Screening mammogram detected left breast mass at 12 o'clock position 2.5 cm spiculated mass. Abnormal left axillary lymph node 1.3 cm. 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 Donne Hazel: Residual IDC 2.2 cm, DCIS intermediate grade, margins negative, 1/2 lymph nodes positive ER 40% weak, PR negative, HER2 negative, Ki-67 20% 3.Adjuvant radiationcompleted10/10/22 4.Follow-up adjuvant antiestrogen therapy with letrozole 1 mg daily x7 yearsstarted10/15/22 ------------------------------------------------------------------------------------------------------------ letrozoletoxicities:denies any side effects  Intermittent loose stools: Patient is unsure if it is related to letrozole.  I instructed her to stop letrozole for a week and see what happens. If it is letrozole related then we can switch her to anastrozole. If it is not letrozole related that I instructed her to take fiber containing foods as well as Metamucil.  Osteoporosis: BonesTscore -2.8: Osteoporosis: Takingcalcium and vitamin D, I sent a prescription for Fosamax today. 02/03/2022: CT CAP: No evidence of metastatic disease.  Interval increase in the right ovarian cyst 4.9 cm Send a referral to Dr. Berline Lopes who saw the patient with a plan to perform pelvic ultrasound in 3 to 6 months and a CA125.  Breast cancer surveillance: 1.  Mammogram 02/01/2022: [facility]: Benign, breast density category C 2. breast exam 05/15/2022: Benign  Return to clinic in 1 year for follow-up

## 2022-05-15 NOTE — Progress Notes (Signed)
Patient Care Team: Laurey Morale, MD as PCP - General Josue Hector, MD as PCP - Cardiology (Cardiology) Elsie Stain, MD (Pulmonary Disease) [name], MD as Consulting Physician (General Surgery) [name], MD as Consulting Physician (Hematology and Oncology) Kyung Rudd, MD as Consulting Physician (Radiation Oncology) Leroy Libman, MD as Consulting Physician (Neurology)  DIAGNOSIS:  Encounter Diagnosis  Name Primary?   Malignant neoplasm of upper-outer quadrant of left breast in female, estrogen receptor positive ([place])     SUMMARY OF ONCOLOGIC HISTORY: Oncology History  Malignant neoplasm of upper-outer quadrant of left breast in female, estrogen receptor positive ([place])  01/02/2021 Initial Diagnosis   Patient experienced chronic left breast tenderness for 2 years with a negative mammogram last year. Screening mammogram showed a 2.5cm central left breast mass and one enlarged lymph node. US showed a 2.6cm mass at the 12 o'clock position and a 1.3cm mass in the left axillary tail. 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 Donne Hazel: Residual IDC 2.2 cm, DCIS intermediate grade, margins negative, 1/2 lymph nodes positive ER 40% weak, PR negative, HER2 negative, Ki-67 20%   05/18/2021 Cancer Staging   Staging form: Breast, AJCC 8th Edition - Pathologic stage from 05/18/2021: No Stage Recommended (ypT2, pN1a, cM0, G2) -  Signed by [name], NP on 10/31/2021 Stage prefix: Post-therapy Histologic grading system: 3 grade system   06/27/2021 - 08/15/2021 Radiation Therapy   Site Technique Total Dose (Gy) Dose per Fx (Gy) Completed Fx Beam Energies  Breast, Left: Breast_Lt 3D 50.4/50.4 1.8 28/28 6X, 10X  Breast, Left: Breast_Lt_SCLV 3D 50.4/50.4 1.8 28/28 6X, 10X  Breast, Left: Breast_Lt_Bst 3D 10/10 2 5/5 6X, 10X     08/19/2021 - 08/2028 Anti-estrogen oral therapy   Letrozole 1 mg daily     CHIEF COMPLIANT: Follow-up of left breast cancer on Letrozole  INTERVAL HISTORY: Christina Jacobs is a 81 y.o. with above-mentioned history of breast cancer having undergone chemotherapy. She presents to the clinic today for a follow-up. States that she has been doing good. She has some concerns about her breast swelling. Denies any side effects from the Letrozole. She does have Afib.    ALLERGIES:  is allergic to codeine, eliquis [apixaban], propoxyphene hcl, meperidine hcl, and tape.  MEDICATIONS:  Current Outpatient Medications  Medication Sig Dispense Refill   albuterol (VENTOLIN HFA) 108 (90 Base) MCG/ACT inhaler INHALE 2 PUFFS INTO THE LUNGS EVERY 4 HOURS AS NEEDED FOR WHEEZE OR FOR SHORTNESS OF BREATH 18 each 5   alendronate (FOSAMAX) 70 MG tablet Take 1 tablet (70 mg total) by mouth once a week. Take with a full glass of water on an empty stomach. (Patient not taking: Reported on 02/16/2022) 12 tablet 3   atorvastatin (LIPITOR) 10 MG tablet Take 1 tablet (10 mg total) by mouth daily. 90 tablet 3   flecainide (TAMBOCOR) 50 MG tablet TAKE 1 TABLET BY MOUTH  TWICE A DAY 180 tablet 2   latanoprost (XALATAN) 0.005 % ophthalmic solution Place 1 drop into both eyes at bedtime.     letrozole (FEMARA) 2.5 MG tablet Take 1 tablet (2.5 mg total) by mouth daily. 90 tablet 3   levothyroxine (SYNTHROID) 100 MCG tablet Take 1 tablet (100 mcg total) by mouth daily. 90 tablet 3   metoprolol succinate (TOPROL-XL)  50 MG 24 hr tablet TAKE 2 TABLETS BY MOUTH IN THE MORNING AND 1 IN THE EVENING 270 tablet 2   Multiple Vitamin (MULTIVITAMIN WITH MINERALS) TABS tablet Take 1 tablet by mouth in the morning. Centrum for Women (Patient not taking: Reported on 02/16/2022)     omeprazole (PRILOSEC) 40 MG capsule TAKE 1 CAPSULE BY MOUTH EVERY DAY AS NEEDED FOR REFLUX 90 capsule 0   predniSONE (DELTASONE) 1 MG tablet Take 4 tablets (4 mg total) by mouth daily with breakfast.     timolol (TIMOPTIC) 0.5 % ophthalmic solution Place 1 drop into both eyes in the morning and at bedtime.     traMADol (ULTRAM) 50 MG tablet Take 2 tablets (100 mg total) by mouth every 6 (six) hours as needed. (Patient not taking: Reported on 02/16/2022) 10 tablet 0   triamcinolone cream (KENALOG) 0.1 % APPLY TO AFFECTED AREA TWICE A DAY (Patient not taking: Reported on 02/16/2022) 90 g 0   triamterene-hydrochlorothiazide (DYAZIDE) 37.5-25 MG capsule TAKE 1 EACH (1 CAPSULE TOTAL) BY MOUTH DAILY. (Patient taking differently: Take 1 capsule by mouth 3 (three) times a week. Per patient she takes M, W, F) 90 capsule 3   XARELTO 15 MG TABS tablet TAKE 1 TABLET (15 MG TOTAL) BY MOUTH DAILY WITH SUPPER. 90 tablet 1   No current facility-administered medications for this visit.    PHYSICAL EXAMINATION: ECOG PERFORMANCE STATUS: 1 - Symptomatic but completely ambulatory  Vitals:   05/15/22 1417  BP: (!) 156/92  Pulse: 90  Resp: 18  Temp: 97.9 F (36.6 C)  SpO2: 95%   Filed Weights   05/15/22 1417  Weight: 155 lb (70.3 kg)      LABORATORY DATA:  I have reviewed the data as listed    Latest Ref Rng & Units 02/02/2022   10:19 AM 11/20/2021   10:58 AM 05/23/2021   10:32 AM  CMP  Glucose 70 - 99 mg/dL  104  92   BUN 6 - 23 mg/dL  25  22   Creatinine 0.44 - 1.00 mg/dL 1.60  1.54  1.42   Sodium 135 - 145 mEq/L  140  142   Potassium 3.5 - 5.1 mEq/L  3.7  3.7   Chloride 96 - 112 mEq/L  104  104   CO2 19 - 32 mEq/L  28  26   Calcium 8.4 - 10.5  mg/dL  9.8  9.1   Total Protein 6.0 - 8.3 g/dL  7.3    Total Bilirubin 0.2 - 1.2 mg/dL  1.0    Alkaline Phos 39 - 117 U/L  62    AST 0 - 37 U/L  19    ALT 0 - 35 U/L  13      Lab Results  Component Value Date   WBC 7.4 11/20/2021   HGB 14.9 11/20/2021   HCT 46.0 11/20/2021   MCV 95.2 11/20/2021   PLT 159.0 11/20/2021   NEUTROABS 5.7 11/20/2021    ASSESSMENT & PLAN:  Malignant neoplasm of upper-outer quadrant of left breast in female, estrogen receptor positive ([place]) 01/02/2021: Screening  mammogram detected left breast mass at 12 o'clock position 2.5 cm spiculated mass.  Abnormal left axillary lymph node 1.3 cm.  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  Neoadjuvant chemotherapy with 4 cycles of Taxotere and Cytoxan completed 03/30/2021 2. 05/18/2021: Left lumpectomy with Dr. Donne Hazel: Residual IDC 2.2 cm, DCIS intermediate grade, margins negative, 1/2 lymph nodes positive ER 40% weak, PR negative, HER2 negative, Ki-67 20% 3.  Adjuvant radiation completed 08/14/21 4.  Follow-up adjuvant antiestrogen therapy with letrozole 1 mg daily x7 years started 08/19/21 ------------------------------------------------------------------------------------------------------------  letrozole toxicities: denies any side effects   Osteoporosis: Bones T score -2.8: Osteoporosis: Taking calcium and vitamin D, and Fosamax. 02/03/2022: CT CAP: No evidence of metastatic disease.  Interval increase in the right ovarian cyst 4.9 cm Send a referral to Dr. Berline Lopes who saw the patient with a plan to perform pelvic ultrasound in 3 to 6 months and a CA125.   Breast cancer surveillance: 1.  Mammogram 02/01/2022: [facility]: Benign, breast density category C 2. breast exam 05/15/2022: Benign  Return to clinic in 1 year for  follow-up    Orders Placed This Encounter  Procedures   Ambulatory referral to Physical Therapy    Referral Priority:   Routine    Referral Type:   Physical Medicine    Referral Reason:   Specialty Services Required    Requested Specialty:   Physical Therapy    Number of Visits Requested:   1   The patient has a good understanding of the overall plan. she agrees with it. she will call with any problems that may develop before the next visit here. Total time spent: 30 mins including face to face time and time spent for planning, charting and co-ordination of care   Harriette Ohara, MD 05/15/22    I Gardiner Coins am scribing for Dr. Lindi Adie  I have reviewed the above documentation for accuracy and completeness, and I agree with the above.

## 2022-05-17 ENCOUNTER — Telehealth: Payer: Self-pay | Admitting: Hematology and Oncology

## 2022-05-17 NOTE — Telephone Encounter (Signed)
Scheduled appointment per 7/11 los. Patient is aware.

## 2022-05-21 ENCOUNTER — Other Ambulatory Visit: Payer: Self-pay

## 2022-05-21 ENCOUNTER — Encounter: Payer: Self-pay | Admitting: Physical Therapy

## 2022-05-21 ENCOUNTER — Ambulatory Visit: Payer: Medicare HMO | Admitting: Physical Therapy

## 2022-05-21 ENCOUNTER — Ambulatory Visit: Payer: Medicare HMO | Attending: Hematology and Oncology | Admitting: Physical Therapy

## 2022-05-21 DIAGNOSIS — Z170 Estrogen receptor positive status [ER+]: Secondary | ICD-10-CM | POA: Diagnosis not present

## 2022-05-21 DIAGNOSIS — K449 Diaphragmatic hernia without obstruction or gangrene: Secondary | ICD-10-CM | POA: Diagnosis not present

## 2022-05-21 DIAGNOSIS — C50412 Malignant neoplasm of upper-outer quadrant of left female breast: Secondary | ICD-10-CM | POA: Insufficient documentation

## 2022-05-21 DIAGNOSIS — I890 Lymphedema, not elsewhere classified: Secondary | ICD-10-CM | POA: Diagnosis present

## 2022-05-21 NOTE — Therapy (Signed)
OUTPATIENT PHYSICAL THERAPY ONCOLOGY EVALUATION  Patient Name: Christina Jacobs MRN: [id number] DOB:Jul 12, 1941, 81 y.o., female Today's Date: 05/21/2022   PT End of Session - 05/21/22 1155     Visit Number 1    Number of Visits 9    Date for PT Re-Evaluation 06/18/22    PT Start Time 1104    PT Stop Time 1155    PT Time Calculation (min) 51 min    Activity Tolerance Patient tolerated treatment well    Behavior During Therapy WFL for tasks assessed/performed             Past Medical History:  Diagnosis Date   Adenomatous colon polyp    Anal fissure    Anxiety    Asthma    Dr. Halford Chessman   Atrial fibrillation [facility])    Bowel obstruction ([place])    Bursitis    CKD (chronic kidney disease) stage 3, GFR 30-59 ml/min ([place])    sees Dr. [name]    Complication of anesthesia    post op N/V   Diverticulosis    Dyspnea    After a meal   Dysrhythmia    Afib   GERD (gastroesophageal reflux disease)    Glaucoma    Hiatal hernia    Giant type IV hiatal hernia; sees Dr. [name] at [facility]   Hyperlipidemia    Hypertension    Hypothyroidism    Myasthenia gravis ([place]) 2013   followed [facility] Dr Scheryl Marten, diplopia only   Osteoarthritis    see's Dr. [name]   Osteoporosis    last DEXA 08-03-11   Past Surgical History:  Procedure Laterality Date   ABDOMINAL HYSTERECTOMY     APPENDECTOMY     BREAST LUMPECTOMY Bilateral     several, enign breast lumps removed   BREAST LUMPECTOMY WITH RADIOACTIVE SEED AND SENTINEL LYMPH NODE BIOPSY Left 05/18/2021   Procedure: LEFT BREAST LUMPECTOMY WITH RADIOACTIVE SEED AND LEFT AXILLARY SENTINEL LYMPH NODE BIOPSY;  Surgeon: [name], MD;  Location: [place];  Service: General;  Laterality: Left;   CARDIOVERSION N/A 12/31/2017   Procedure: CARDIOVERSION;  Surgeon: Josue Hector, MD;  Location: [place];  Service: Cardiovascular;  Laterality: N/A;   CARDIOVERSION N/A 02/11/2018   Procedure: CARDIOVERSION;  Surgeon:  Josue Hector, MD;  Location: [facility] ENDOSCOPY;  Service: Cardiovascular;  Laterality: N/A;   COLONOSCOPY  09/27/2010   per Dr. Sharlett Iles, benign polyp, no repeats needed    [place]  10/18/2010   Dr. Hassell Done attempted but could reduce this, the entire stomach is above the diaphragm   PORT-A-CATH REMOVAL N/A 05/18/2021   Procedure: REMOVAL PORT-A-CATH;  Surgeon: [name], MD;  Location: [place];  Service: General;  Laterality: N/A;   PORTACATH PLACEMENT Right 01/25/2021   Procedure: INSERTION PORT-A-CATH;  Surgeon: [name], MD;  Location: [facility];  Service: General;  Laterality: Right;  START TIME OF 8:30 AM FOR 60 MINUTES ROOM 4 [place]   RADIOACTIVE SEED GUIDED AXILLARY SENTINEL LYMPH NODE Left 05/18/2021   Procedure: RADIOACTIVE SEED GUIDED LEFT AXILLARY NODE EXCISION;  Surgeon: [name], MD;  Location: [place];  Service: General;  Laterality: Left;   THYROIDECTOMY, [place]     Patient Active Problem List   Diagnosis Date Noted   Port-A-Cath in place 01/26/2021   Malignant neoplasm of upper-outer quadrant of left breast in female, estrogen receptor positive ([place]) 01/02/2021   Paroxysmal  atrial fibrillation ([place]) 08/19/2019   CKD (chronic kidney disease) stage 3, GFR 30-59 ml/min ([facility]) 08/09/2017   Hiatal hernia 08/09/2017   Glaucoma 08/08/2016   Restrictive lung disease 09/29/2013   Myasthenia gravis ([place]) 07/14/2012   HERNIA 07/17/2010   DIVERTICULOSIS, COLON 07/14/2010   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 level reflux 07/14/2010   EXTERNAL HEMORRHOIDS 07/13/2009   GERD 02/16/2009   Osteoarthrosis, unspecified whether generalized or localized, unspecified site 07/01/2008   MITRAL VALVE PROLAPSE 11/17/2007   Hypothyroidism 07/16/2007   Dyslipidemia 07/16/2007   Essential hypertension 07/16/2007   Osteoporosis 07/16/2007    PCP: Alysia Penna, MD  REFERRING PROVIDER: [name], MD    REFERRING DIAG: [phone] (ICD-10-CM) - Malignant neoplasm of upper-outer quadrant of left breast in female, estrogen receptor positive ([place])   THERAPY DIAG:  Lymphedema, not elsewhere classified  Malignant neoplasm of upper-outer quadrant of left breast in female, estrogen receptor positive ([place])  ONSET DATE: 04/19/22  Rationale for Evaluation and Treatment Rehabilitation  SUBJECTIVE                                                                                                                                                                                           SUBJECTIVE STATEMENT: A month ago the swelling started. It would swell at night but in the morning it would go down. A week or two later the other one seemed to be swelling. The left breast is still swelling but not as much. It is worse on the bottom corner of my breast.   PERTINENT HISTORY:  Patient was diagnosed on 12/06/2020 with left grade III invasive ductal carcinoma breast cancer. She underwent neoadjuvant chemotherapy from 01/26/2021-03/11/2021 followed by a left lumpectomy and sentinel node biopsy on 05/18/2021 with 1 of 2 lymph nodes being positive for cancer. It is ER positive, PR negative, and HER2 negative with a Ki67 of 20%. She has a hiatal hernia which is severe and limits her ability to exercise.   PAIN:  Are you having pain? No  PRECAUTIONS:  left UE Lymphedema risk, None   WEIGHT BEARING RESTRICTIONS No  FALLS:  Has patient fallen in last 6 months? No  LIVING ENVIRONMENT: Lives with: lives with their spouse Lives in: House/apartment Stairs: Yes; pt does not use the stairs Has following equipment at home: None  OCCUPATION: retired  LEISURE: pt does not exercise  HAND DOMINANCE : right   PRIOR LEVEL OF FUNCTION: Independent  PATIENT GOALS to get rid of the swelling in the breast   OBJECTIVE  COGNITION:  Overall cognitive status: Within functional limits for tasks  assessed   PALPATION:  Increased fibrosis in inferior medial L breast and all along inferior L breast  OBSERVATIONS / OTHER ASSESSMENTS: L breast larger than R breast with increased pore size surrounding L areola  POSTURE: forward head, rounded shoulders  UPPER EXTREMITY AROM/PROM:  A/PROM RIGHT   eval   Shoulder extension   Shoulder flexion 149  Shoulder abduction 164  Shoulder internal rotation   Shoulder external rotation     (Blank rows = not tested)  A/PROM LEFT   eval  Shoulder extension   Shoulder flexion 147  Shoulder abduction 160  Shoulder internal rotation   Shoulder external rotation     (Blank rows = not tested)    LYMPHEDEMA ASSESSMENTS:   SURGERY TYPE/DATE: L lumpectomy and SLNB on 05/18/21  NUMBER OF LYMPH NODES REMOVED: 2 (1 was positive)  CHEMOTHERAPY: 01/26/21=03/11/21  RADIATION:completed  HORMONE TREATMENT: currently on letrozole  INFECTIONS: none  LYMPHEDEMA ASSESSMENTS:   LANDMARK RIGHT  eval  10 cm proximal to olecranon process 28.7  Olecranon process 22.5  10 cm proximal to ulnar styloid process 18.4  Just proximal to ulnar styloid process 14.2  Across hand at thumb web space 17.6  At base of 2nd digit 5.5  (Blank rows = not tested)  LANDMARK LEFT  eval  10 cm proximal to olecranon process 27.2  Olecranon process 23.1  10 cm proximal to ulnar styloid process 18  Just proximal to ulnar styloid process 13.1  Across hand at thumb web space 15.9  At base of 2nd digit 5.9  (Blank rows = not tested)   QUICK DASH SURVEY:   Katina Dung - 05/21/22 0001     Open a tight or new jar Moderate difficulty    Do heavy household chores (wash walls, wash floors) Mild difficulty    Carry a shopping bag or briefcase No difficulty    Wash your back No difficulty    Use a knife to cut food No difficulty    Recreational activities in which you take some force or impact through your arm, shoulder, or hand (golf, hammering, tennis) Mild difficulty     During the past week, to what extent has your arm, shoulder or hand problem interfered with your normal social activities with family, friends, neighbors, or groups? Not at all    During the past week, to what extent has your arm, shoulder or hand problem limited your work or other regular daily activities Not at all    Arm, shoulder, or hand pain. None    Tingling (pins and needles) in your arm, shoulder, or hand None    Difficulty Sleeping No difficulty    DASH Score 9.09 %            [facility] Breast: 15   TODAY'S TREATMENT  05/21/22: In supine with head support on pillows as follows: short neck, 5 diaphragmatic breaths, R axillary nodes and establishment of interaxillary pathway, L inguinal nodes and establishment of axillo inguinal pathway, L breast moving fluid towards pathways spending increased time on area of fibrosis then retracing all steps while educating pt in anatomy and physiology of the lymphatic system and basic principles of MLD; created foam chip pack for pt to wear in her bra  PATIENT EDUCATION:  Education details: anatomy and physiology of lymphatic system and basic principles of MLD, compression bra Person educated: Patient Education method: Explanation Education comprehension: verbalized understanding   HOME EXERCISE PROGRAM: Get compression bra  ASSESSMENT:  CLINICAL IMPRESSION: Patient is a 81 y.o. female  who was seen today for physical therapy evaluation and treatment for left breast lymphedema. In 2022 she underwent a left lumpectomy and SLNB (1/2). She also completed chemo and radiation. About a month ago her left breast began to swell. There is fibrosis present in the inferior portion of her L breast. She reports her R breast seemed swollen but is better now. Educated pt that this is not normal and to get in contact with her doctor if she feels it is swelling again. Pt would benefit from skilled PT services to decrease L breast lymphedema,  instruct pt in self MLD and assist her with obtaining compression garments.     OBJECTIVE IMPAIRMENTS decreased knowledge of condition, increased edema, postural dysfunction, and pain.   ACTIVITY LIMITATIONS  none  PARTICIPATION LIMITATIONS:  none  PERSONAL FACTORS  none  are also affecting patient's functional outcome.   REHAB POTENTIAL: Excellent  CLINICAL DECISION MAKING: Stable/uncomplicated  EVALUATION COMPLEXITY: Low  GOALS: Goals reviewed with patient? Yes  SHORT TERM GOALS=LONG TERM GOALS Target date: 06/18/2022    Pt will be independent in self MLD for long term management of lymphedema.  Baseline: Goal status: INITIAL  2.  Pt will obtain a compression bra for long term management of lymphedema. Baseline:  Goal status: INITIAL  3.  Pt will demonstrate improved tissue texture (no more fibrosis) in inferior L breast to decrease risk of infection.  Baseline:  Goal status: INITIAL   PLAN: PT FREQUENCY: 2x/week  PT DURATION: 4 weeks  PLANNED INTERVENTIONS: Therapeutic exercises, Patient/Family education, Self Care, Orthotic/Fit training, and Manual therapy  PLAN FOR NEXT SESSION: instruct in self MLD and issue handout, how was chip pack, did pt get bra?   [facility] [place], PT 05/21/2022, 12:11 PM

## 2022-05-30 ENCOUNTER — Ambulatory Visit: Payer: Medicare HMO | Admitting: Physical Therapy

## 2022-05-30 ENCOUNTER — Encounter: Payer: Self-pay | Admitting: Physical Therapy

## 2022-05-30 DIAGNOSIS — I890 Lymphedema, not elsewhere classified: Secondary | ICD-10-CM

## 2022-05-30 DIAGNOSIS — Z170 Estrogen receptor positive status [ER+]: Secondary | ICD-10-CM

## 2022-05-30 NOTE — Patient Instructions (Signed)
Self manual lymph drainage: Perform this sequence once a day.  Only give enough pressure no your skin to make the skin move.  Diaphragmatic - Supine   Inhale through nose making navel move out toward hands. Exhale through puckered lips, hands follow navel in. Repeat _5__ times. Rest _10__ seconds between repeats.   Copyright  VHI. All rights reserved.  Hug yourself.  Do circles at your neck just above your collarbones.  Repeat this 10 times.  Axilla - One at a Time   Using full weight of flat hand and fingers at center of uninvolved (Right) armpit, make _10__ in-place circles.   Copyright  VHI. All rights reserved.  LEG: Inguinal Nodes Stimulation   With small finger side of hand against hip crease on involved (left) side, gently perform circles at the crease. Repeat __10_ times.   Copyright  VHI. All rights reserved.  Axilla to Inguinal Nodes - Sweep   On involved side, gentle stretch skin downwards towards groin_4__ times from armpit along side of trunk to hip crease.  Now gently stretch skin from the involved side to the uninvolved side across the chest at the shoulder line.  Repeat that 4 times.  Draw an imaginary diagonal line from upper outer breast through the nipple area toward lower inner breast.  Direct fluid upward and inward from this line toward the pathway across your upper chest .  Do this in three rows to treat all of the upper inner breast tissue, and do each row 3-4x.      Direct fluid to treat all of lower outer breast tissue downward and outward toward      pathway that is aimed at the left groin.  Finish by doing the pathways as described above going from your involved armpit to the same side groin and going across your upper chest from the involved shoulder to the uninvolved shoulder.  Repeat the steps above where you do circles in your left groin and right armpit. Copyright  VHI. All rights reserved.

## 2022-05-30 NOTE — Therapy (Signed)
OUTPATIENT PHYSICAL THERAPY ONCOLOGY TREATMENT  Patient Name: RANESSA KOSTA MRN: [id number] DOB:1941-06-18, 81 y.o., female Today's Date: 05/30/2022   PT End of Session - 05/30/22 1603     Visit Number 2    Number of Visits 9    Date for PT Re-Evaluation 06/18/22    PT Start Time 1603    PT Stop Time 1648    PT Time Calculation (min) 45 min    Activity Tolerance Patient tolerated treatment well    Behavior During Therapy WFL for tasks assessed/performed             Past Medical History:  Diagnosis Date   Adenomatous colon polyp    Anal fissure    Anxiety    Asthma    Dr. Halford Chessman   Atrial fibrillation [facility])    Bowel obstruction ([place])    Bursitis    CKD (chronic kidney disease) stage 3, GFR 30-59 ml/min ([place])    sees Dr. [name]    Complication of anesthesia    post op N/V   Diverticulosis    Dyspnea    After a meal   Dysrhythmia    Afib   GERD (gastroesophageal reflux disease)    Glaucoma    Hiatal hernia    Giant type IV hiatal hernia; sees Dr. [name] at [facility]   Hyperlipidemia    Hypertension    Hypothyroidism    Myasthenia gravis ([place]) 2013   followed at [facility] Dr Scheryl Marten, diplopia only   Osteoarthritis    see's Dr. [name]   Osteoporosis    last DEXA 08-03-11   Past Surgical History:  Procedure Laterality Date   ABDOMINAL HYSTERECTOMY     APPENDECTOMY     BREAST LUMPECTOMY Bilateral     several, enign breast lumps removed   BREAST LUMPECTOMY WITH RADIOACTIVE SEED AND SENTINEL LYMPH NODE BIOPSY Left 05/18/2021   Procedure: LEFT BREAST LUMPECTOMY WITH RADIOACTIVE SEED AND LEFT AXILLARY SENTINEL LYMPH NODE BIOPSY;  Surgeon: [name], MD;  Location: [place];  Service: General;  Laterality: Left;   CARDIOVERSION N/A 12/31/2017   Procedure: CARDIOVERSION;  Surgeon: Josue Hector, MD;  Location: [place];  Service: Cardiovascular;  Laterality: N/A;   CARDIOVERSION N/A 02/11/2018   Procedure: CARDIOVERSION;  Surgeon:  Josue Hector, MD;  [facility]: [facility] ENDOSCOPY;  Service: Cardiovascular;  Laterality: N/A;   COLONOSCOPY  09/27/2010   per Dr. Sharlett Iles, benign polyp, no repeats needed    [place]  10/18/2010   Dr. Hassell Done attempted but could reduce this, the entire stomach is above the diaphragm   PORT-A-CATH REMOVAL N/A 05/18/2021   Procedure: REMOVAL PORT-A-CATH;  Surgeon: [name], MD;  Location: [place];  Service: General;  Laterality: N/A;   PORTACATH PLACEMENT Right 01/25/2021   Procedure: INSERTION PORT-A-CATH;  Surgeon: [name], MD;  Location: [facility];  Service: General;  Laterality: Right;  START TIME OF 8:30 AM FOR 60 MINUTES ROOM 4 [place] IQ   RADIOACTIVE SEED GUIDED AXILLARY SENTINEL LYMPH NODE Left 05/18/2021   Procedure: RADIOACTIVE SEED GUIDED LEFT AXILLARY NODE EXCISION;  Surgeon: [name], MD;  Location: [place];  Service: General;  Laterality: Left;   THYROIDECTOMY, [place]     Patient Active Problem List   Diagnosis Date Noted   Port-A-Cath in place 01/26/2021   Malignant neoplasm of upper-outer quadrant of left breast in female, estrogen receptor positive ([place]) 01/02/2021   Paroxysmal  atrial fibrillation ([place]) 08/19/2019   CKD (chronic kidney disease) stage 3, GFR 30-59 ml/min ([facility]) 08/09/2017   Hiatal hernia 08/09/2017   Glaucoma 08/08/2016   Restrictive lung disease 09/29/2013   Myasthenia gravis ([place]) 07/14/2012   HERNIA 07/17/2010   DIVERTICULOSIS, COLON 07/14/2010   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 level reflux 07/14/2010   EXTERNAL HEMORRHOIDS 07/13/2009   GERD 02/16/2009   Osteoarthrosis, unspecified whether generalized or localized, unspecified site 07/01/2008   MITRAL VALVE PROLAPSE 11/17/2007   Hypothyroidism 07/16/2007   Dyslipidemia 07/16/2007   Essential hypertension 07/16/2007   Osteoporosis 07/16/2007    PCP: Alysia Penna, MD  REFERRING PROVIDER: [name], MD    REFERRING DIAG: [phone] (ICD-10-CM) - Malignant neoplasm of upper-outer quadrant of left breast in female, estrogen receptor positive ([place])   THERAPY DIAG:  Lymphedema, not elsewhere classified  Malignant neoplasm of upper-outer quadrant of left breast in female, estrogen receptor positive ([place])  ONSET DATE: 04/19/22  Rationale for Evaluation and Treatment Rehabilitation  SUBJECTIVE                                                                                                                                                                                           SUBJECTIVE STATEMENT: I can not get measured for a compression bra until next week. I think the chip pack is helping.   PERTINENT HISTORY:  Patient was diagnosed on 12/06/2020 with left grade III invasive ductal carcinoma breast cancer. She underwent neoadjuvant chemotherapy from 01/26/2021-03/11/2021 followed by a left lumpectomy and sentinel node biopsy on 05/18/2021 with 1 of 2 lymph nodes being positive for cancer. It is ER positive, PR negative, and HER2 negative with a Ki67 of 20%. She has a hiatal hernia which is severe and limits her ability to exercise.   PAIN:  Are you having pain? No  PRECAUTIONS:  left UE Lymphedema risk, None   WEIGHT BEARING RESTRICTIONS No  FALLS:  Has patient fallen in last 6 months? No  LIVING ENVIRONMENT: Lives with: lives with their spouse Lives in: House/apartment Stairs: Yes; pt does not use the stairs Has following equipment at home: None  OCCUPATION: retired  LEISURE: pt does not exercise  HAND DOMINANCE : right   PRIOR LEVEL OF FUNCTION: Independent  PATIENT GOALS to get rid of the swelling in the breast   OBJECTIVE  COGNITION:  Overall cognitive status: Within functional limits for tasks assessed   PALPATION: Increased fibrosis in inferior medial L breast and all along inferior L breast  OBSERVATIONS / OTHER ASSESSMENTS: L breast larger than R breast with  increased pore size surrounding L areola  POSTURE: forward head, rounded shoulders  UPPER EXTREMITY AROM/PROM:  A/PROM RIGHT   eval   Shoulder extension   Shoulder flexion 149  Shoulder abduction 164  Shoulder internal rotation   Shoulder external rotation     (Blank rows = not tested)  A/PROM LEFT   eval  Shoulder extension   Shoulder flexion 147  Shoulder abduction 160  Shoulder internal rotation   Shoulder external rotation     (Blank rows = not tested)    LYMPHEDEMA ASSESSMENTS:   SURGERY TYPE/DATE: L lumpectomy and SLNB on 05/18/21  NUMBER OF LYMPH NODES REMOVED: 2 (1 was positive)  CHEMOTHERAPY: 01/26/21=03/11/21  RADIATION:completed  HORMONE TREATMENT: currently on letrozole  INFECTIONS: none  LYMPHEDEMA ASSESSMENTS:   LANDMARK RIGHT  eval  10 cm proximal to olecranon process 28.7  Olecranon process 22.5  10 cm proximal to ulnar styloid process 18.4  Just proximal to ulnar styloid process 14.2  Across hand at thumb web space 17.6  At base of 2nd digit 5.5  (Blank rows = not tested)  LANDMARK LEFT  eval  10 cm proximal to olecranon process 27.2  Olecranon process 23.1  10 cm proximal to ulnar styloid process 18  Just proximal to ulnar styloid process 13.1  Across hand at thumb web space 15.9  At base of 2nd digit 5.9  (Blank rows = not tested)   QUICK DASH SURVEY:    [facility] Breast: 15   TODAY'S TREATMENT  05/30/22: In supine with head support on pillows as follows: short neck, 5 diaphragmatic breaths, R axillary nodes and establishment of interaxillary pathway, L inguinal nodes and establishment of axillo inguinal pathway, L breast moving fluid towards pathways spending increased time on area of fibrosis then retracing all steps while educating pt in proper skin stretch technique and sequence and having pt return demonstrate all steps  05/21/22: In supine with head support on pillows as follows: short neck, 5 diaphragmatic  breaths, R axillary nodes and establishment of interaxillary pathway, L inguinal nodes and establishment of axillo inguinal pathway, L breast moving fluid towards pathways spending increased time on area of fibrosis then retracing all steps while educating pt in anatomy and physiology of the lymphatic system and basic principles of MLD; created foam chip pack for pt to wear in her bra  PATIENT EDUCATION:  Education details: anatomy and physiology of lymphatic system and basic principles of MLD, compression bra Person educated: Patient Education method: Explanation Education comprehension: verbalized understanding   HOME EXERCISE PROGRAM: Get compression bra  ASSESSMENT:  CLINICAL IMPRESSION: Continued to educate pt in self MLD technique. Issued handout to pt today and had her return demonstrate all steps for MLD of L breast. Her fibrosis in inferior medial breast softened by end of session.    OBJECTIVE IMPAIRMENTS decreased knowledge of condition, increased edema, postural dysfunction, and pain.   ACTIVITY LIMITATIONS  none  PARTICIPATION LIMITATIONS:  none  PERSONAL FACTORS  none  are also affecting patient's functional outcome.   REHAB POTENTIAL: Excellent  CLINICAL DECISION MAKING: Stable/uncomplicated  EVALUATION COMPLEXITY: Low  GOALS: Goals reviewed with patient? Yes  SHORT TERM GOALS=LONG TERM GOALS Target date: 06/27/2022    Pt will be independent in self MLD for long term management of lymphedema.  Baseline: Goal status: INITIAL  2.  Pt will obtain a compression bra for long term management of lymphedema. Baseline:  Goal status: INITIAL  3.  Pt will demonstrate improved tissue texture (no more fibrosis) in inferior L breast to  decrease risk of infection.  Baseline:  Goal status: INITIAL   PLAN: PT FREQUENCY: 2x/week  PT DURATION: 4 weeks  PLANNED INTERVENTIONS: Therapeutic exercises, Patient/Family education, Self Care, Orthotic/Fit training, and  Manual therapy  PLAN FOR NEXT SESSION: cont to instruct in self MLD and issue handout, pt to get bra next week   Manus Gunning, PT 05/30/2022, 4:50 PM

## 2022-06-05 ENCOUNTER — Ambulatory Visit: Payer: Medicare HMO | Admitting: Rehabilitation

## 2022-06-12 ENCOUNTER — Ambulatory Visit: Payer: Medicare HMO | Attending: Hematology and Oncology | Admitting: Physical Therapy

## 2022-06-12 ENCOUNTER — Encounter: Payer: Self-pay | Admitting: Physical Therapy

## 2022-06-12 DIAGNOSIS — C50412 Malignant neoplasm of upper-outer quadrant of left female breast: Secondary | ICD-10-CM | POA: Diagnosis present

## 2022-06-12 DIAGNOSIS — I890 Lymphedema, not elsewhere classified: Secondary | ICD-10-CM | POA: Insufficient documentation

## 2022-06-12 DIAGNOSIS — Z170 Estrogen receptor positive status [ER+]: Secondary | ICD-10-CM | POA: Diagnosis present

## 2022-06-12 NOTE — Therapy (Signed)
OUTPATIENT PHYSICAL THERAPY ONCOLOGY TREATMENT  Patient Name: Christina Jacobs MRN: [id number] DOB:1941-02-04, 81 y.o., female Today's Date: 06/12/2022   PT End of Session - 06/12/22 1410     Visit Number 3    Number of Visits 9    Date for PT Re-Evaluation 06/18/22    PT Start Time 1409    PT Stop Time 1452    PT Time Calculation (min) 43 min    Activity Tolerance Patient tolerated treatment well    Behavior During Therapy WFL for tasks assessed/performed             Past Medical History:  Diagnosis Date   Adenomatous colon polyp    Anal fissure    Anxiety    Asthma    Dr. Halford Chessman   Atrial fibrillation [facility])    Bowel obstruction ([place])    Bursitis    CKD (chronic kidney disease) stage 3, GFR 30-59 ml/min ([place])    sees Dr. [name]    Complication of anesthesia    post op N/V   Diverticulosis    Dyspnea    After a meal   Dysrhythmia    Afib   GERD (gastroesophageal reflux disease)    Glaucoma    Hiatal hernia    Giant type IV hiatal hernia; sees Dr. [name] at [facility]   Hyperlipidemia    Hypertension    Hypothyroidism    Myasthenia gravis ([place]) 2013   followed at [facility] Dr Scheryl Marten, diplopia only   Osteoarthritis    see's Dr. [name]   Osteoporosis    last DEXA 08-03-11   Past Surgical History:  Procedure Laterality Date   ABDOMINAL HYSTERECTOMY     APPENDECTOMY     BREAST LUMPECTOMY Bilateral     several, enign breast lumps removed   BREAST LUMPECTOMY WITH RADIOACTIVE SEED AND SENTINEL LYMPH NODE BIOPSY Left 05/18/2021   Procedure: LEFT BREAST LUMPECTOMY WITH RADIOACTIVE SEED AND LEFT AXILLARY SENTINEL LYMPH NODE BIOPSY;  Surgeon: [name], MD;  Location: [place];  Service: General;  Laterality: Left;   CARDIOVERSION N/A 12/31/2017   Procedure: CARDIOVERSION;  Surgeon: Josue Hector, MD;  Location: [place];  Service: Cardiovascular;  Laterality: N/A;   CARDIOVERSION N/A 02/11/2018   Procedure: CARDIOVERSION;  Surgeon:  Josue Hector, MD;  Location: [facility] ENDOSCOPY;  Service: Cardiovascular;  Laterality: N/A;   COLONOSCOPY  09/27/2010   per Dr. Sharlett Iles, benign polyp, no repeats needed    [place]  10/18/2010   Dr. Hassell Done attempted but could reduce this, the entire stomach is above the diaphragm   PORT-A-CATH REMOVAL N/A 05/18/2021   Procedure: REMOVAL PORT-A-CATH;  Surgeon: [name], MD;  Location: [place];  Service: General;  Laterality: N/A;   PORTACATH PLACEMENT Right 01/25/2021   Procedure: INSERTION PORT-A-CATH;  Surgeon: [name], MD;  [facility];  Service: General;  Laterality: Right;  START TIME OF 8:30 AM FOR 60 MINUTES ROOM 4 [place]   RADIOACTIVE SEED GUIDED AXILLARY SENTINEL LYMPH NODE Left 05/18/2021   Procedure: RADIOACTIVE SEED GUIDED LEFT AXILLARY NODE EXCISION;  Surgeon: [name], MD;  Location: [place];  Service: General;  Laterality: Left;   THYROIDECTOMY, [place]     Patient Active Problem List   Diagnosis Date Noted   Port-A-Cath in place 01/26/2021   Malignant neoplasm of upper-outer quadrant of left breast in female, estrogen receptor positive ([place]) 01/02/2021   Paroxysmal  atrial fibrillation ([place]) 08/19/2019   CKD (chronic kidney disease) stage 3, GFR 30-59 ml/min ([facility]) 08/09/2017   Hiatal hernia 08/09/2017   Glaucoma 08/08/2016   Restrictive lung disease 09/29/2013   Myasthenia gravis ([place]) 07/14/2012   HERNIA 07/17/2010   DIVERTICULOSIS, COLON 07/14/2010   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 level reflux 07/14/2010   EXTERNAL HEMORRHOIDS 07/13/2009   GERD 02/16/2009   Osteoarthrosis, unspecified whether generalized or localized, unspecified site 07/01/2008   MITRAL VALVE PROLAPSE 11/17/2007   Hypothyroidism 07/16/2007   Dyslipidemia 07/16/2007   Essential hypertension 07/16/2007   Osteoporosis 07/16/2007    PCP: Alysia Penna, MD  REFERRING PROVIDER: [name], MD    REFERRING DIAG: [phone] (ICD-10-CM) - Malignant neoplasm of upper-outer quadrant of left breast in female, estrogen receptor positive ([place])   THERAPY DIAG:  Lymphedema, not elsewhere classified  Malignant neoplasm of upper-outer quadrant of left breast in female, estrogen receptor positive ([place])  ONSET DATE: 04/19/22  Rationale for Evaluation and Treatment Rehabilitation  SUBJECTIVE                                                                                                                                                                                           SUBJECTIVE STATEMENT: I did not really have time to work on my swelling because my grandson got married over the weekend. I got my compression bra and this is my second day wearing it.  PERTINENT HISTORY:  Patient was diagnosed on 12/06/2020 with left grade III invasive ductal carcinoma breast cancer. She underwent neoadjuvant chemotherapy from 01/26/2021-03/11/2021 followed by a left lumpectomy and sentinel node biopsy on 05/18/2021 with 1 of 2 lymph nodes being positive for cancer. It is ER positive, PR negative, and HER2 negative with a Ki67 of 20%. She has a hiatal hernia which is severe and limits her ability to exercise.   PAIN:  Are you having pain? No  PRECAUTIONS:  left UE Lymphedema risk, None   WEIGHT BEARING RESTRICTIONS No  FALLS:  Has patient fallen in last 6 months? No  LIVING ENVIRONMENT: Lives with: lives with their spouse Lives in: House/apartment Stairs: Yes; pt does not use the stairs Has following equipment at home: None  OCCUPATION: retired  LEISURE: pt does not exercise  HAND DOMINANCE : right   PRIOR LEVEL OF FUNCTION: Independent  PATIENT GOALS to get rid of the swelling in the breast   OBJECTIVE  COGNITION:  Overall cognitive status: Within functional limits for tasks assessed   PALPATION: Increased fibrosis in inferior medial L breast and all along inferior L  breast  OBSERVATIONS / OTHER ASSESSMENTS: L breast  larger than R breast with increased pore size surrounding L areola  POSTURE: forward head, rounded shoulders  UPPER EXTREMITY AROM/PROM:  A/PROM RIGHT   eval   Shoulder extension   Shoulder flexion 149  Shoulder abduction 164  Shoulder internal rotation   Shoulder external rotation     (Blank rows = not tested)  A/PROM LEFT   eval  Shoulder extension   Shoulder flexion 147  Shoulder abduction 160  Shoulder internal rotation   Shoulder external rotation     (Blank rows = not tested)    LYMPHEDEMA ASSESSMENTS:   SURGERY TYPE/DATE: L lumpectomy and SLNB on 05/18/21  NUMBER OF LYMPH NODES REMOVED: 2 (1 was positive)  CHEMOTHERAPY: 01/26/21=03/11/21  RADIATION:completed  HORMONE TREATMENT: currently on letrozole  INFECTIONS: none  LYMPHEDEMA ASSESSMENTS:   LANDMARK RIGHT  eval  10 cm proximal to olecranon process 28.7  Olecranon process 22.5  10 cm proximal to ulnar styloid process 18.4  Just proximal to ulnar styloid process 14.2  Across hand at thumb web space 17.6  At base of 2nd digit 5.5  (Blank rows = not tested)  LANDMARK LEFT  eval  10 cm proximal to olecranon process 27.2  Olecranon process 23.1  10 cm proximal to ulnar styloid process 18  Just proximal to ulnar styloid process 13.1  Across hand at thumb web space 15.9  At base of 2nd digit 5.9  (Blank rows = not tested)   QUICK DASH SURVEY:    [facility] Breast: 15   TODAY'S TREATMENT  06/12/22: In supine with head support on pillows as follows: short neck, 5 diaphragmatic breaths, R axillary nodes and establishment of interaxillary pathway, L inguinal nodes and establishment of axillo inguinal pathway, L breast moving fluid towards pathways  spending increased time on area of fibrosis then retracing all steps   05/30/22: In supine with head support on pillows as follows: short neck, 5 diaphragmatic breaths, R axillary nodes and  establishment of interaxillary pathway, L inguinal nodes and establishment of axillo inguinal pathway, L breast moving fluid towards pathways spending increased time on area of fibrosis then retracing all steps while educating pt in proper skin stretch technique and sequence and having pt return demonstrate all steps  05/21/22: In supine with head support on pillows as follows: short neck, 5 diaphragmatic breaths, R axillary nodes and establishment of interaxillary pathway, L inguinal nodes and establishment of axillo inguinal pathway, L breast moving fluid towards pathways spending increased time on area of fibrosis then retracing all steps while educating pt in anatomy and physiology of the lymphatic system and basic principles of MLD; created foam chip pack for pt to wear in her bra  PATIENT EDUCATION:  Education details: anatomy and physiology of lymphatic system and basic principles of MLD, compression bra Person educated: Patient Education method: Explanation Education comprehension: verbalized understanding   HOME EXERCISE PROGRAM: Get compression bra  ASSESSMENT:  CLINICAL IMPRESSION: Continued with MLD to L breast. Her left breast feels more fibrotic in medial inferior section. She has just started wearing her compression bra so educated pt to wear it as much as possible and place her chip pack in her bra over the fibrotic area.    OBJECTIVE IMPAIRMENTS decreased knowledge of condition, increased edema, postural dysfunction, and pain.   ACTIVITY LIMITATIONS  none  PARTICIPATION LIMITATIONS:  none  PERSONAL FACTORS  none  are also affecting patient's functional outcome.   REHAB POTENTIAL: Excellent  CLINICAL DECISION MAKING: Stable/uncomplicated  EVALUATION COMPLEXITY: Low  GOALS: Goals reviewed with patient? Yes  SHORT TERM GOALS=LONG TERM GOALS Target date: 07/10/2022    Pt will be independent in self MLD for long term management of lymphedema.  Baseline: Goal status:  INITIAL  2.  Pt will obtain a compression bra for long term management of lymphedema. Baseline:  Goal status: INITIAL  3.  Pt will demonstrate improved tissue texture (no more fibrosis) in inferior L breast to decrease risk of infection.  Baseline:  Goal status: INITIAL   PLAN: PT FREQUENCY: 2x/week  PT DURATION: 4 weeks  PLANNED INTERVENTIONS: Therapeutic exercises, Patient/Family education, Self Care, Orthotic/Fit training, and Manual therapy  PLAN FOR NEXT SESSION: cont to instruct in self MLD and issue handout   Manus Gunning, PT 06/12/2022, 2:58 PM

## 2022-06-14 ENCOUNTER — Encounter: Payer: Self-pay | Admitting: Physical Therapy

## 2022-06-14 ENCOUNTER — Ambulatory Visit: Payer: Medicare HMO | Admitting: Physical Therapy

## 2022-06-14 DIAGNOSIS — C50412 Malignant neoplasm of upper-outer quadrant of left female breast: Secondary | ICD-10-CM

## 2022-06-14 DIAGNOSIS — I890 Lymphedema, not elsewhere classified: Secondary | ICD-10-CM

## 2022-06-14 NOTE — Therapy (Signed)
OUTPATIENT PHYSICAL THERAPY ONCOLOGY TREATMENT  Patient Name: Christina Jacobs MRN: [id number] DOB:November 20, 1940, 81 y.o., female Today's Date: 06/14/2022   PT End of Session - 06/14/22 1101     Visit Number 4    Number of Visits 9    Date for PT Re-Evaluation 06/18/22    PT Start Time 1101    PT Stop Time 1151    PT Time Calculation (min) 50 min    Activity Tolerance Patient tolerated treatment well    Behavior During Therapy WFL for tasks assessed/performed             Past Medical History:  Diagnosis Date   Adenomatous colon polyp    Anal fissure    Anxiety    Asthma    Dr. Halford Chessman   Atrial fibrillation [facility])    Bowel obstruction ([place])    Bursitis    CKD (chronic kidney disease) stage 3, GFR 30-59 ml/min ([place])    sees Dr. [name]    Complication of anesthesia    post op N/V   Diverticulosis    Dyspnea    After a meal   Dysrhythmia    Afib   GERD (gastroesophageal reflux disease)    Glaucoma    Hiatal hernia    Giant type IV hiatal hernia; sees Dr. [name] at [facility]   Hyperlipidemia    Hypertension    Hypothyroidism    Myasthenia gravis ([place]) 2013   followed at [facility] Dr Scheryl Marten, diplopia only   Osteoarthritis    see's Dr. [name]   Osteoporosis    last DEXA 08-03-11   Past Surgical History:  Procedure Laterality Date   ABDOMINAL HYSTERECTOMY     APPENDECTOMY     BREAST LUMPECTOMY Bilateral     several, enign breast lumps removed   BREAST LUMPECTOMY WITH RADIOACTIVE SEED AND SENTINEL LYMPH NODE BIOPSY Left 05/18/2021   Procedure: LEFT BREAST LUMPECTOMY WITH RADIOACTIVE SEED AND LEFT AXILLARY SENTINEL LYMPH NODE BIOPSY;  Surgeon: [name], MD;  Location: [place];  Service: General;  Laterality: Left;   CARDIOVERSION N/A 12/31/2017   Procedure: CARDIOVERSION;  Surgeon: Josue Hector, MD;  Location: [place];  Service: Cardiovascular;  Laterality: N/A;   CARDIOVERSION N/A 02/11/2018   Procedure: CARDIOVERSION;  Surgeon:  Josue Hector, MD;  Location: [facility] ENDOSCOPY;  Service: Cardiovascular;  Laterality: N/A;   COLONOSCOPY  09/27/2010   per Dr. Sharlett Iles, benign polyp, no repeats needed    [place]  10/18/2010   Dr. Hassell Done attempted but could reduce this, the entire stomach is above the diaphragm   PORT-A-CATH REMOVAL N/A 05/18/2021   Procedure: REMOVAL PORT-A-CATH;  Surgeon: [name], MD;  Location: [place];  Service: General;  Laterality: N/A;   PORTACATH PLACEMENT Right 01/25/2021   Procedure: INSERTION PORT-A-CATH;  Surgeon: [name], MD;  [facility];  Service: General;  Laterality: Right;  START TIME OF 8:30 AM FOR 60 MINUTES ROOM 4 [place]   RADIOACTIVE SEED GUIDED AXILLARY SENTINEL LYMPH NODE Left 05/18/2021   Procedure: RADIOACTIVE SEED GUIDED LEFT AXILLARY NODE EXCISION;  Surgeon: [name], MD;  Location: [place];  Service: General;  Laterality: Left;   THYROIDECTOMY, [place]     Patient Active Problem List   Diagnosis Date Noted   Port-A-Cath in place 01/26/2021   Malignant neoplasm of upper-outer quadrant of left breast in female, estrogen receptor positive ([place]) 01/02/2021   Paroxysmal  atrial fibrillation ([place]) 08/19/2019   CKD (chronic kidney disease) stage 3, GFR 30-59 ml/min ([facility]) 08/09/2017   Hiatal hernia 08/09/2017   Glaucoma 08/08/2016   Restrictive lung disease 09/29/2013   Myasthenia gravis ([place]) 07/14/2012   HERNIA 07/17/2010   DIVERTICULOSIS, COLON 07/14/2010   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 level reflux 07/14/2010   EXTERNAL HEMORRHOIDS 07/13/2009   GERD 02/16/2009   Osteoarthrosis, unspecified whether generalized or localized, unspecified site 07/01/2008   MITRAL VALVE PROLAPSE 11/17/2007   Hypothyroidism 07/16/2007   Dyslipidemia 07/16/2007   Essential hypertension 07/16/2007   Osteoporosis 07/16/2007    PCP: Alysia Penna, MD  REFERRING PROVIDER: [name], MD    REFERRING DIAG: [phone] (ICD-10-CM) - Malignant neoplasm of upper-outer quadrant of left breast in female, estrogen receptor positive ([place])   THERAPY DIAG:  Lymphedema, not elsewhere classified  Malignant neoplasm of upper-outer quadrant of left breast in female, estrogen receptor positive ([place])  ONSET DATE: 04/19/22  Rationale for Evaluation and Treatment Rehabilitation  SUBJECTIVE                                                                                                                                                                                           SUBJECTIVE STATEMENT: I tried to sleep with the compression bra on but I only made it half the night and I had to take it off.   PERTINENT HISTORY:  Patient was diagnosed on 12/06/2020 with left grade III invasive ductal carcinoma breast cancer. She underwent neoadjuvant chemotherapy from 01/26/2021-03/11/2021 followed by a left lumpectomy and sentinel node biopsy on 05/18/2021 with 1 of 2 lymph nodes being positive for cancer. It is ER positive, PR negative, and HER2 negative with a Ki67 of 20%. She has a hiatal hernia which is severe and limits her ability to exercise.   PAIN:  Are you having pain? No  PRECAUTIONS:  left UE Lymphedema risk, None   WEIGHT BEARING RESTRICTIONS No  FALLS:  Has patient fallen in last 6 months? No  LIVING ENVIRONMENT: Lives with: lives with their spouse Lives in: House/apartment Stairs: Yes; pt does not use the stairs Has following equipment at home: None  OCCUPATION: retired  LEISURE: pt does not exercise  HAND DOMINANCE : right   PRIOR LEVEL OF FUNCTION: Independent  PATIENT GOALS to get rid of the swelling in the breast   OBJECTIVE  COGNITION:  Overall cognitive status: Within functional limits for tasks assessed   PALPATION: Increased fibrosis in inferior medial L breast and all along inferior L breast  OBSERVATIONS / OTHER ASSESSMENTS: L breast larger than R breast  with increased pore  size surrounding L areola  POSTURE: forward head, rounded shoulders  UPPER EXTREMITY AROM/PROM:  A/PROM RIGHT   eval   Shoulder extension   Shoulder flexion 149  Shoulder abduction 164  Shoulder internal rotation   Shoulder external rotation     (Blank rows = not tested)  A/PROM LEFT   eval  Shoulder extension   Shoulder flexion 147  Shoulder abduction 160  Shoulder internal rotation   Shoulder external rotation     (Blank rows = not tested)    LYMPHEDEMA ASSESSMENTS:   SURGERY TYPE/DATE: L lumpectomy and SLNB on 05/18/21  NUMBER OF LYMPH NODES REMOVED: 2 (1 was positive)  CHEMOTHERAPY: 01/26/21=03/11/21  RADIATION:completed  HORMONE TREATMENT: currently on letrozole  INFECTIONS: none  LYMPHEDEMA ASSESSMENTS:   LANDMARK RIGHT  eval  10 cm proximal to olecranon process 28.7  Olecranon process 22.5  10 cm proximal to ulnar styloid process 18.4  Just proximal to ulnar styloid process 14.2  Across hand at thumb web space 17.6  At base of 2nd digit 5.5  (Blank rows = not tested)  LANDMARK LEFT  eval  10 cm proximal to olecranon process 27.2  Olecranon process 23.1  10 cm proximal to ulnar styloid process 18  Just proximal to ulnar styloid process 13.1  Across hand at thumb web space 15.9  At base of 2nd digit 5.9  (Blank rows = not tested)   QUICK DASH SURVEY:    [facility] Breast: 15   TODAY'S TREATMENT  06/14/22: In supine with head support on pillows as follows: short neck, 5 diaphragmatic breaths, R axillary nodes and establishment of interaxillary pathway, L inguinal nodes and establishment of axillo inguinal pathway, L breast moving fluid towards pathways  spending increased time on area of fibrosis then retracing all steps. Had pt return demonstrate correct technique and sequence today and gave verbal and tactile cues as needed for appropriate skin stretch.    06/12/22: In supine with head support on pillows as  follows: short neck, 5 diaphragmatic breaths, R axillary nodes and establishment of interaxillary pathway, L inguinal nodes and establishment of axillo inguinal pathway, L breast moving fluid towards pathways  spending increased time on area of fibrosis then retracing all steps   05/30/22: In supine with head support on pillows as follows: short neck, 5 diaphragmatic breaths, R axillary nodes and establishment of interaxillary pathway, L inguinal nodes and establishment of axillo inguinal pathway, L breast moving fluid towards pathways spending increased time on area of fibrosis then retracing all steps while educating pt in proper skin stretch technique and sequence and having pt return demonstrate all steps  05/21/22: In supine with head support on pillows as follows: short neck, 5 diaphragmatic breaths, R axillary nodes and establishment of interaxillary pathway, L inguinal nodes and establishment of axillo inguinal pathway, L breast moving fluid towards pathways spending increased time on area of fibrosis then retracing all steps while educating pt in anatomy and physiology of the lymphatic system and basic principles of MLD; created foam chip pack for pt to wear in her bra  PATIENT EDUCATION:  Education details: anatomy and physiology of lymphatic system and basic principles of MLD, compression bra Person educated: Patient Education method: Explanation Education comprehension: verbalized understanding   HOME EXERCISE PROGRAM: Get compression bra  ASSESSMENT:  CLINICAL IMPRESSION: Continued with MLD to L breast. Focused on having pt demonstrate correct sequence and skin stretch today. Pt is becoming more independent in self MLD and requiring less verbal and tactile  cues for a correct skin stretch. Cut a piece of peach foam for pt to wear over area of fibrosis since the chip pack was not tolerated well.    OBJECTIVE IMPAIRMENTS decreased knowledge of condition, increased edema, postural  dysfunction, and pain.   ACTIVITY LIMITATIONS  none  PARTICIPATION LIMITATIONS:  none  PERSONAL FACTORS  none  are also affecting patient's functional outcome.   REHAB POTENTIAL: Excellent  CLINICAL DECISION MAKING: Stable/uncomplicated  EVALUATION COMPLEXITY: Low  GOALS: Goals reviewed with patient? Yes  SHORT TERM GOALS=LONG TERM GOALS Target date: 07/12/2022    Pt will be independent in self MLD for long term management of lymphedema.  Baseline: Goal status: INITIAL  2.  Pt will obtain a compression bra for long term management of lymphedema. Baseline:  Goal status: MET 06/14/22  3.  Pt will demonstrate improved tissue texture (no more fibrosis) in inferior L breast to decrease risk of infection.  Baseline:  Goal status: INITIAL   PLAN: PT FREQUENCY: 2x/week  PT DURATION: 4 weeks  PLANNED INTERVENTIONS: Therapeutic exercises, Patient/Family education, Self Care, Orthotic/Fit training, and Manual therapy  PLAN FOR NEXT SESSION: cont to instruct in self MLD, how was peach foam?   [employer], PT 06/14/2022, 11:58 AM

## 2022-06-19 ENCOUNTER — Encounter: Payer: Self-pay | Admitting: Physical Therapy

## 2022-06-19 ENCOUNTER — Ambulatory Visit: Payer: Medicare HMO | Admitting: Physical Therapy

## 2022-06-19 DIAGNOSIS — I890 Lymphedema, not elsewhere classified: Secondary | ICD-10-CM | POA: Diagnosis not present

## 2022-06-19 DIAGNOSIS — Z170 Estrogen receptor positive status [ER+]: Secondary | ICD-10-CM

## 2022-06-19 NOTE — Therapy (Signed)
OUTPATIENT PHYSICAL THERAPY ONCOLOGY TREATMENT  Patient Name: Christina Jacobs MRN: [id number] DOB:03/19/1941, 81 y.o., female Today's Date: 06/19/2022   PT End of Session - 06/19/22 1405     Visit Number 5    Number of Visits 9    Date for PT Re-Evaluation 06/18/22    PT Start Time 1404    PT Stop Time 1453    PT Time Calculation (min) 49 min    Activity Tolerance Patient tolerated treatment well    Behavior During Therapy WFL for tasks assessed/performed             Past Medical History:  Diagnosis Date   Adenomatous colon polyp    Anal fissure    Anxiety    Asthma    Dr. Halford Chessman   Atrial fibrillation [facility])    Bowel obstruction ([place])    Bursitis    CKD (chronic kidney disease) stage 3, GFR 30-59 ml/min ([place])    sees Dr. [name]    Complication of anesthesia    post op N/V   Diverticulosis    Dyspnea    After a meal   Dysrhythmia    Afib   GERD (gastroesophageal reflux disease)    Glaucoma    Hiatal hernia    Giant type IV hiatal hernia; sees Dr. [name] at [facility]   Hyperlipidemia    Hypertension    Hypothyroidism    Myasthenia gravis ([place]) 2013   followed at [facility] Dr Scheryl Marten, diplopia only   Osteoarthritis    see's Dr. [name]   Osteoporosis    last DEXA 08-03-11   Past Surgical History:  Procedure Laterality Date   ABDOMINAL HYSTERECTOMY     APPENDECTOMY     BREAST LUMPECTOMY Bilateral     several, enign breast lumps removed   BREAST LUMPECTOMY WITH RADIOACTIVE SEED AND SENTINEL LYMPH NODE BIOPSY Left 05/18/2021   Procedure: LEFT BREAST LUMPECTOMY WITH RADIOACTIVE SEED AND LEFT AXILLARY SENTINEL LYMPH NODE BIOPSY;  Surgeon: [name], MD;  Location: [place];  Service: General;  Laterality: Left;   CARDIOVERSION N/A 12/31/2017   Procedure: CARDIOVERSION;  Surgeon: Josue Hector, MD;  Location: [place];  Service: Cardiovascular;  Laterality: N/A;   CARDIOVERSION N/A 02/11/2018   Procedure: CARDIOVERSION;  Surgeon:  Josue Hector, MD;  Location: [facility] ENDOSCOPY;  Service: Cardiovascular;  Laterality: N/A;   COLONOSCOPY  09/27/2010   per Dr. Sharlett Iles, benign polyp, no repeats needed    [place]  10/18/2010   Dr. Hassell Done attempted but could reduce this, the entire stomach is above the diaphragm   PORT-A-CATH REMOVAL N/A 05/18/2021   Procedure: REMOVAL PORT-A-CATH;  Surgeon: [name], MD;  Location: [place];  Service: General;  Laterality: N/A;   PORTACATH PLACEMENT Right 01/25/2021   Procedure: INSERTION PORT-A-CATH;  Surgeon: [name], MD;  [facility];  Service: General;  Laterality: Right;  START TIME OF 8:30 AM FOR 60 MINUTES ROOM 4 [place]   RADIOACTIVE SEED GUIDED AXILLARY SENTINEL LYMPH NODE Left 05/18/2021   Procedure: RADIOACTIVE SEED GUIDED LEFT AXILLARY NODE EXCISION;  Surgeon: [name], MD;  Location: [place];  Service: General;  Laterality: Left;   THYROIDECTOMY, [place]     Patient Active Problem List   Diagnosis Date Noted   Port-A-Cath in place 01/26/2021   Malignant neoplasm of upper-outer quadrant of left breast in female, estrogen receptor positive ([place]) 01/02/2021   Paroxysmal  atrial fibrillation ([place]) 08/19/2019   CKD (chronic kidney disease) stage 3, GFR 30-59 ml/min ([facility]) 08/09/2017   Hiatal hernia 08/09/2017   Glaucoma 08/08/2016   Restrictive lung disease 09/29/2013   Myasthenia gravis ([place]) 07/14/2012   HERNIA 07/17/2010   DIVERTICULOSIS, COLON 07/14/2010   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 level reflux 07/14/2010   EXTERNAL HEMORRHOIDS 07/13/2009   GERD 02/16/2009   Osteoarthrosis, unspecified whether generalized or localized, unspecified site 07/01/2008   MITRAL VALVE PROLAPSE 11/17/2007   Hypothyroidism 07/16/2007   Dyslipidemia 07/16/2007   Essential hypertension 07/16/2007   Osteoporosis 07/16/2007    PCP: Alysia Penna, MD  REFERRING PROVIDER: [name], MD    REFERRING DIAG: [phone] (ICD-10-CM) - Malignant neoplasm of upper-outer quadrant of left breast in female, estrogen receptor positive ([place])   THERAPY DIAG:  Lymphedema, not elsewhere classified  Malignant neoplasm of upper-outer quadrant of left breast in female, estrogen receptor positive ([place])  ONSET DATE: 04/19/22  Rationale for Evaluation and Treatment Rehabilitation  SUBJECTIVE                                                                                                                                                                                           SUBJECTIVE STATEMENT: I tried to sleep with the compression bra on but I only made it half the night and I had to take it off.   PERTINENT HISTORY:  Patient was diagnosed on 12/06/2020 with left grade III invasive ductal carcinoma breast cancer. She underwent neoadjuvant chemotherapy from 01/26/2021-03/11/2021 followed by a left lumpectomy and sentinel node biopsy on 05/18/2021 with 1 of 2 lymph nodes being positive for cancer. It is ER positive, PR negative, and HER2 negative with a Ki67 of 20%. She has a hiatal hernia which is severe and limits her ability to exercise.   PAIN:  Are you having pain? No  PRECAUTIONS:  left UE Lymphedema risk, None   WEIGHT BEARING RESTRICTIONS No  FALLS:  Has patient fallen in last 6 months? No  LIVING ENVIRONMENT: Lives with: lives with their spouse Lives in: House/apartment Stairs: Yes; pt does not use the stairs Has following equipment at home: None  OCCUPATION: retired  LEISURE: pt does not exercise  HAND DOMINANCE : right   PRIOR LEVEL OF FUNCTION: Independent  PATIENT GOALS to get rid of the swelling in the breast   OBJECTIVE  COGNITION:  Overall cognitive status: Within functional limits for tasks assessed   PALPATION: Increased fibrosis in inferior medial L breast and all along inferior L breast  OBSERVATIONS / OTHER ASSESSMENTS: L breast larger than R breast  with increased pore  size surrounding L areola  POSTURE: forward head, rounded shoulders  UPPER EXTREMITY AROM/PROM:  A/PROM RIGHT   eval   Shoulder extension   Shoulder flexion 149  Shoulder abduction 164  Shoulder internal rotation   Shoulder external rotation     (Blank rows = not tested)  A/PROM LEFT   eval  Shoulder extension   Shoulder flexion 147  Shoulder abduction 160  Shoulder internal rotation   Shoulder external rotation     (Blank rows = not tested)    LYMPHEDEMA ASSESSMENTS:   SURGERY TYPE/DATE: L lumpectomy and SLNB on 05/18/21  NUMBER OF LYMPH NODES REMOVED: 2 (1 was positive)  CHEMOTHERAPY: 01/26/21=03/11/21  RADIATION:completed  HORMONE TREATMENT: currently on letrozole  INFECTIONS: none  LYMPHEDEMA ASSESSMENTS:   LANDMARK RIGHT  eval  10 cm proximal to olecranon process 28.7  Olecranon process 22.5  10 cm proximal to ulnar styloid process 18.4  Just proximal to ulnar styloid process 14.2  Across hand at thumb web space 17.6  At base of 2nd digit 5.5  (Blank rows = not tested)  LANDMARK LEFT  eval  10 cm proximal to olecranon process 27.2  Olecranon process 23.1  10 cm proximal to ulnar styloid process 18  Just proximal to ulnar styloid process 13.1  Across hand at thumb web space 15.9  At base of 2nd digit 5.9  (Blank rows = not tested)   QUICK DASH SURVEY:    [facility] Breast: 15   TODAY'S TREATMENT  06/14/22: In supine with head support on pillows as follows with pt demonstrating each of the following steps: short neck, 5 diaphragmatic breaths, R axillary nodes and establishment of interaxillary pathway, L inguinal nodes and establishment of axillo inguinal pathway, L breast moving fluid towards pathways  spending increased time on area of fibrosis then retracing all steps. Had pt return demonstrate correct technique and sequence today and gave verbal and tactile cues as needed for appropriate skin stretch.     06/14/22: In supine with head support on pillows as follows: short neck, 5 diaphragmatic breaths, R axillary nodes and establishment of interaxillary pathway, L inguinal nodes and establishment of axillo inguinal pathway, L breast moving fluid towards pathways  spending increased time on area of fibrosis then retracing all steps. Had pt return demonstrate correct technique and sequence today and gave verbal and tactile cues as needed for appropriate skin stretch.    06/12/22: In supine with head support on pillows as follows: short neck, 5 diaphragmatic breaths, R axillary nodes and establishment of interaxillary pathway, L inguinal nodes and establishment of axillo inguinal pathway, L breast moving fluid towards pathways  spending increased time on area of fibrosis then retracing all steps   05/30/22: In supine with head support on pillows as follows: short neck, 5 diaphragmatic breaths, R axillary nodes and establishment of interaxillary pathway, L inguinal nodes and establishment of axillo inguinal pathway, L breast moving fluid towards pathways spending increased time on area of fibrosis then retracing all steps while educating pt in proper skin stretch technique and sequence and having pt return demonstrate all steps  05/21/22: In supine with head support on pillows as follows: short neck, 5 diaphragmatic breaths, R axillary nodes and establishment of interaxillary pathway, L inguinal nodes and establishment of axillo inguinal pathway, L breast moving fluid towards pathways spending increased time on area of fibrosis then retracing all steps while educating pt in anatomy and physiology of the lymphatic system and basic principles of MLD; created foam  chip pack for pt to wear in her bra  PATIENT EDUCATION:  Education details: anatomy and physiology of lymphatic system and basic principles of MLD, compression bra Person educated: Patient Education method: Explanation Education comprehension:  verbalized understanding   HOME EXERCISE PROGRAM: Get compression bra  ASSESSMENT:  CLINICAL IMPRESSION: Assessed pt's progress towards goals in therapy and pt reports that she still does not feel comfortable with self MLD. She has been wearing her compression bra though she still has fibrosis in medial breast that improves with MLD. Continued with MLD today and had pt demonstrate all steps while therapist provided verbal and tactile cues for correct skin stretch. Pt would benefit from additional PT services to continue to decrease L breast lymphedema and progress spt towards independent management.    OBJECTIVE IMPAIRMENTS decreased knowledge of condition, increased edema, postural dysfunction, and pain.   ACTIVITY LIMITATIONS  none  PARTICIPATION LIMITATIONS:  none  PERSONAL FACTORS  none  are also affecting patient's functional outcome.   REHAB POTENTIAL: Excellent  CLINICAL DECISION MAKING: Stable/uncomplicated  EVALUATION COMPLEXITY: Low  GOALS: Goals reviewed with patient? Yes  SHORT TERM GOALS=LONG TERM GOALS Target date: 07/17/2022    Pt will be independent in self MLD for long term management of lymphedema.  Baseline: Goal status: IN PROGRESS 06/19/22- pt still does not feel independent with this  2.  Pt will obtain a compression bra for long term management of lymphedema. Baseline:  Goal status: MET 06/14/22  3.  Pt will demonstrate improved tissue texture (no more fibrosis) in inferior L breast to decrease risk of infection.  Baseline:  Goal status: IN PROGRESS 06/19/22- pt is still demonstrating fibrosis though at times it improves   PLAN: PT FREQUENCY: 2x/week  PT DURATION: 4 weeks  PLANNED INTERVENTIONS: Therapeutic exercises, Patient/Family education, Self Care, Orthotic/Fit training, and Manual therapy  PLAN FOR NEXT SESSION: cont to instruct in self MLD, how was peach foam?   [employer], PT 06/19/2022, 3:04 PM

## 2022-06-21 ENCOUNTER — Ambulatory Visit: Payer: Medicare HMO | Admitting: Physical Therapy

## 2022-06-21 ENCOUNTER — Encounter: Payer: Self-pay | Admitting: Physical Therapy

## 2022-06-21 DIAGNOSIS — I890 Lymphedema, not elsewhere classified: Secondary | ICD-10-CM

## 2022-06-21 DIAGNOSIS — C50412 Malignant neoplasm of upper-outer quadrant of left female breast: Secondary | ICD-10-CM

## 2022-06-21 NOTE — Therapy (Signed)
OUTPATIENT PHYSICAL THERAPY ONCOLOGY TREATMENT  Patient Name: RAYLINN KOSAR MRN: [id number] DOB:03-28-1941, 81 y.o., female Today's Date: 06/21/2022   PT End of Session - 06/21/22 1304     Visit Number 6    Number of Visits 13    Date for PT Re-Evaluation 07/17/22    PT Start Time 1303    PT Stop Time 1351    PT Time Calculation (min) 48 min    Activity Tolerance Patient tolerated treatment well    Behavior During Therapy WFL for tasks assessed/performed              Past Medical History:  Diagnosis Date   Adenomatous colon polyp    Anal fissure    Anxiety    Asthma    Dr. Halford Chessman   Atrial fibrillation [facility])    Bowel obstruction ([place])    Bursitis    CKD (chronic kidney disease) stage 3, GFR 30-59 ml/min ([facility])    sees Dr. [name]    Complication of anesthesia    post op N/V   Diverticulosis    Dyspnea    After a meal   Dysrhythmia    Afib   GERD (gastroesophageal reflux disease)    Glaucoma    Hiatal hernia    Giant type IV hiatal hernia; sees Dr. [name] at [facility]   Hyperlipidemia    Hypertension    Hypothyroidism    Myasthenia gravis ([place]) 2013   followed at [facility] Dr Scheryl Marten, diplopia only   Osteoarthritis    see's Dr. [name]   Osteoporosis    last DEXA 08-03-11   Past Surgical History:  Procedure Laterality Date   ABDOMINAL HYSTERECTOMY     APPENDECTOMY     BREAST LUMPECTOMY Bilateral     several, enign breast lumps removed   BREAST LUMPECTOMY WITH RADIOACTIVE SEED AND SENTINEL LYMPH NODE BIOPSY Left 05/18/2021   Procedure: LEFT BREAST LUMPECTOMY WITH RADIOACTIVE SEED AND LEFT AXILLARY SENTINEL LYMPH NODE BIOPSY;  Surgeon: [name], MD;  [facility];  Service: General;  Laterality: Left;   CARDIOVERSION N/A 12/31/2017   Procedure: CARDIOVERSION;  Surgeon: Josue Hector, MD;  Location: [place];  Service: Cardiovascular;  Laterality: N/A;   CARDIOVERSION N/A 02/11/2018   Procedure: CARDIOVERSION;  Surgeon:  Josue Hector, MD;  Location: [facility] ENDOSCOPY;  Service: Cardiovascular;  Laterality: N/A;   COLONOSCOPY  09/27/2010   per Dr. Sharlett Iles, benign polyp, no repeats needed    [place]  10/18/2010   Dr. Hassell Done attempted but could reduce this, the entire stomach is above the diaphragm   PORT-A-CATH REMOVAL N/A 05/18/2021   Procedure: REMOVAL PORT-A-CATH;  Surgeon: [name], MD;  Location: [place];  Service: General;  Laterality: N/A;   PORTACATH PLACEMENT Right 01/25/2021   Procedure: INSERTION PORT-A-CATH;  Surgeon: [name], MD;  [facility];  Service: General;  Laterality: Right;  START TIME OF 8:30 AM FOR 60 MINUTES ROOM 4 [place] IQ   RADIOACTIVE SEED GUIDED AXILLARY SENTINEL LYMPH NODE Left 05/18/2021   Procedure: RADIOACTIVE SEED GUIDED LEFT AXILLARY NODE EXCISION;  Surgeon: [name], MD;  Location: [place];  Service: General;  Laterality: Left;   THYROIDECTOMY, [place]     Patient Active Problem List   Diagnosis Date Noted   Port-A-Cath in place 01/26/2021   Malignant neoplasm of upper-outer quadrant of left breast in female, estrogen receptor positive ([place]) 01/02/2021  Paroxysmal atrial fibrillation ([place]) 08/19/2019   CKD (chronic kidney disease) stage 3, GFR 30-59 ml/min ([facility]) 08/09/2017   Hiatal hernia 08/09/2017   Glaucoma 08/08/2016   Restrictive lung disease 09/29/2013   Myasthenia gravis ([place]) 07/14/2012   HERNIA 07/17/2010   DIVERTICULOSIS, COLON 07/14/2010   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 level reflux 07/14/2010   EXTERNAL HEMORRHOIDS 07/13/2009   GERD 02/16/2009   Osteoarthrosis, unspecified whether generalized or localized, unspecified site 07/01/2008   MITRAL VALVE PROLAPSE 11/17/2007   Hypothyroidism 07/16/2007   Dyslipidemia 07/16/2007   Essential hypertension 07/16/2007   Osteoporosis 07/16/2007    PCP: Alysia Penna, MD  REFERRING PROVIDER: [name], MD    REFERRING DIAG: [phone] (ICD-10-CM) - Malignant neoplasm of upper-outer quadrant of left breast in female, estrogen receptor positive ([place])   THERAPY DIAG:  Lymphedema, not elsewhere classified  Malignant neoplasm of upper-outer quadrant of left breast in female, estrogen receptor positive ([place])  ONSET DATE: 04/19/22  Rationale for Evaluation and Treatment Rehabilitation  SUBJECTIVE                                                                                                                                                                                           SUBJECTIVE STATEMENT: I am feeling better about the self massage.  PERTINENT HISTORY:  Patient was diagnosed on 12/06/2020 with left grade III invasive ductal carcinoma breast cancer. She underwent neoadjuvant chemotherapy from 01/26/2021-03/11/2021 followed by a left lumpectomy and sentinel node biopsy on 05/18/2021 with 1 of 2 lymph nodes being positive for cancer. It is ER positive, PR negative, and HER2 negative with a Ki67 of 20%. She has a hiatal hernia which is severe and limits her ability to exercise.   PAIN:  Are you having pain? No  PRECAUTIONS:  left UE Lymphedema risk, None   WEIGHT BEARING RESTRICTIONS No  FALLS:  Has patient fallen in last 6 months? No  LIVING ENVIRONMENT: Lives with: lives with their spouse Lives in: House/apartment Stairs: Yes; pt does not use the stairs Has following equipment at home: None  OCCUPATION: retired  LEISURE: pt does not exercise  HAND DOMINANCE : right   PRIOR LEVEL OF FUNCTION: Independent  PATIENT GOALS to get rid of the swelling in the breast   OBJECTIVE  COGNITION:  Overall cognitive status: Within functional limits for tasks assessed   PALPATION: Increased fibrosis in inferior medial L breast and all along inferior L breast  OBSERVATIONS / OTHER ASSESSMENTS: L breast larger than R breast with increased pore size surrounding L areola  POSTURE:  forward head, rounded shoulders  UPPER EXTREMITY AROM/PROM:  A/PROM  RIGHT   eval   Shoulder extension   Shoulder flexion 149  Shoulder abduction 164  Shoulder internal rotation   Shoulder external rotation     (Blank rows = not tested)  A/PROM LEFT   eval  Shoulder extension   Shoulder flexion 147  Shoulder abduction 160  Shoulder internal rotation   Shoulder external rotation     (Blank rows = not tested)    LYMPHEDEMA ASSESSMENTS:   SURGERY TYPE/DATE: L lumpectomy and SLNB on 05/18/21  NUMBER OF LYMPH NODES REMOVED: 2 (1 was positive)  CHEMOTHERAPY: 01/26/21=03/11/21  RADIATION:completed  HORMONE TREATMENT: currently on letrozole  INFECTIONS: none  LYMPHEDEMA ASSESSMENTS:   LANDMARK RIGHT  eval  10 cm proximal to olecranon process 28.7  Olecranon process 22.5  10 cm proximal to ulnar styloid process 18.4  Just proximal to ulnar styloid process 14.2  Across hand at thumb web space 17.6  At base of 2nd digit 5.5  (Blank rows = not tested)  LANDMARK LEFT  eval  10 cm proximal to olecranon process 27.2  Olecranon process 23.1  10 cm proximal to ulnar styloid process 18  Just proximal to ulnar styloid process 13.1  Across hand at thumb web space 15.9  At base of 2nd digit 5.9  (Blank rows = not tested)   QUICK DASH SURVEY:    Dena Billet Breast: 15   TODAY'S TREATMENT  06/21/22: In supine with head support on pillows as follows: short neck, 5 diaphragmatic breaths, R axillary nodes and establishment of interaxillary pathway, L inguinal nodes and establishment of axillo inguinal pathway, L breast moving fluid towards pathways  spending increased time on area of fibrosis then retracing all steps   06/14/22: In supine with head support on pillows as follows with pt demonstrating each of the following steps: short neck, 5 diaphragmatic breaths, R axillary nodes and establishment of interaxillary pathway, L inguinal nodes and establishment of  axillo inguinal pathway, L breast moving fluid towards pathways  spending increased time on area of fibrosis then retracing all steps. Had pt return demonstrate correct technique and sequence today and gave verbal and tactile cues as needed for appropriate skin stretch.    06/14/22: In supine with head support on pillows as follows: short neck, 5 diaphragmatic breaths, R axillary nodes and establishment of interaxillary pathway, L inguinal nodes and establishment of axillo inguinal pathway, L breast moving fluid towards pathways  spending increased time on area of fibrosis then retracing all steps. Had pt return demonstrate correct technique and sequence today and gave verbal and tactile cues as needed for appropriate skin stretch.    06/12/22: In supine with head support on pillows as follows: short neck, 5 diaphragmatic breaths, R axillary nodes and establishment of interaxillary pathway, L inguinal nodes and establishment of axillo inguinal pathway, L breast moving fluid towards pathways  spending increased time on area of fibrosis then retracing all steps   05/30/22: In supine with head support on pillows as follows: short neck, 5 diaphragmatic breaths, R axillary nodes and establishment of interaxillary pathway, L inguinal nodes and establishment of axillo inguinal pathway, L breast moving fluid towards pathways spending increased time on area of fibrosis then retracing all steps while educating pt in proper skin stretch technique and sequence and having pt return demonstrate all steps  05/21/22: In supine with head support on pillows as follows: short neck, 5 diaphragmatic breaths, R axillary nodes and establishment of interaxillary pathway, L inguinal nodes and establishment of axillo inguinal  pathway, L breast moving fluid towards pathways spending increased time on area of fibrosis then retracing all steps while educating pt in anatomy and physiology of the lymphatic system and basic principles of  MLD; created foam chip pack for pt to wear in her bra  PATIENT EDUCATION:  Education details: anatomy and physiology of lymphatic system and basic principles of MLD, compression bra Person educated: Patient Education method: Explanation Education comprehension: verbalized understanding   HOME EXERCISE PROGRAM: Get compression bra  ASSESSMENT:  CLINICAL IMPRESSION: Continued with MLD of L breast. Focused on medial breast in area of fibrosis and by end of session therapist and pt could feel a marked improvement in this area with softening noted.    OBJECTIVE IMPAIRMENTS decreased knowledge of condition, increased edema, postural dysfunction, and pain.   ACTIVITY LIMITATIONS  none  PARTICIPATION LIMITATIONS:  none  PERSONAL FACTORS  none  are also affecting patient's functional outcome.   REHAB POTENTIAL: Excellent  CLINICAL DECISION MAKING: Stable/uncomplicated  EVALUATION COMPLEXITY: Low  GOALS: Goals reviewed with patient? Yes  SHORT TERM GOALS=LONG TERM GOALS Target date: 07/17/2022    Pt will be independent in self MLD for long term management of lymphedema.  Baseline: Goal status: IN PROGRESS 06/19/22- pt still does not feel independent with this  2.  Pt will obtain a compression bra for long term management of lymphedema. Baseline:  Goal status: MET 06/14/22  3.  Pt will demonstrate improved tissue texture (no more fibrosis) in inferior L breast to decrease risk of infection.  Baseline:  Goal status: IN PROGRESS 06/19/22- pt is still demonstrating fibrosis though at times it improves   PLAN: PT FREQUENCY: 2x/week  PT DURATION: 4 weeks  PLANNED INTERVENTIONS: Therapeutic exercises, Patient/Family education, Self Care, Orthotic/Fit training, and Manual therapy  PLAN FOR NEXT SESSION: do SOZO, cont to instruct in self MLD, how was peach foam?   [employer], PT 06/21/2022, 1:56 PM

## 2022-06-21 NOTE — Progress Notes (Signed)
Cardiology Office Note   Date:  07/02/2022   ID:  Christina Jacobs, DOB [birth year], MRN [id number]  PCP:  Laurey Morale, MD  Cardiologist:  Dr. Johnsie Cancel, MD  No chief complaint on file.   History of Present Illness: Christina Jacobs is a 81 y.o. female who presents for follow-up of paroxysmal atrial fibrillation diagnosed 12/2017 previously on Eliquis however with an allergic reaction transition to Xarelto for anticoagulation (low-dose secondary to CKD).  She previously underwent DCCV cardioversion however this failed and she was started on flecainide after normal ETT 01/31/2018.  Echocardiogram from 12/2017 with an EF at 55 to 60% and no structural heart disease.  She ultimately underwent successful DCCV while on a AT 01/2018.  Post cardioversion, she wore a monitor which showed 99% NSR.  Also with a history of CKD stage III, HLD, HTN, hypothyroidism, myasthenia gravis and  diagnosis of breast cancer s/p left breast radioactive seed guided lumpectomy, sentinel lymph node biopsy and lymph node excision on 05/18/2021.with chemo and XRT done 08/2021   On low-dose Toprol as higher doses exacerbates her myasthenia gravis.    She has undergone chemotherapy and plans for radiation.   Some insomnia using melatonin Still anxious about prognosis from cancer Had some palpitations with XRT but no PAF   BP elevated 150/80 on repeat Discussed adding norvasc for better control   Past Medical History:  Diagnosis Date   Adenomatous colon polyp    Anal fissure    Anxiety    Asthma    Dr. Halford Chessman   Atrial fibrillation [facility])    Bowel obstruction ([facility])    Bursitis    CKD (chronic kidney disease) stage 3, GFR 30-59 ml/min ([place])    sees Dr. [name]    Complication of anesthesia    post op N/V   Diverticulosis    Dyspnea    After a meal   Dysrhythmia    Afib   GERD (gastroesophageal reflux disease)    Glaucoma    Hiatal hernia    Giant type IV hiatal hernia; sees Dr. [name] at  [facility]   Hyperlipidemia    Hypertension    Hypothyroidism    Myasthenia gravis ([place]) 2013   followed at [facility] Dr Scheryl Marten, diplopia only   Osteoarthritis    see's Dr. [name]   Osteoporosis    last DEXA 08-03-11    Past Surgical History:  Procedure Laterality Date   ABDOMINAL HYSTERECTOMY     APPENDECTOMY     BREAST LUMPECTOMY Bilateral     several, enign breast lumps removed   BREAST LUMPECTOMY WITH RADIOACTIVE SEED AND SENTINEL LYMPH NODE BIOPSY Left 05/18/2021   Procedure: LEFT BREAST LUMPECTOMY WITH RADIOACTIVE SEED AND LEFT AXILLARY SENTINEL LYMPH NODE BIOPSY;  Surgeon: [name], MD;  Location: [place];  Service: General;  Laterality: Left;   CARDIOVERSION N/A 12/31/2017   Procedure: CARDIOVERSION;  Surgeon: Josue Hector, MD;  Location: [place];  Service: Cardiovascular;  Laterality: N/A;   CARDIOVERSION N/A 02/11/2018   Procedure: CARDIOVERSION;  Surgeon: Josue Hector, MD;  Location: [place];  Service: Cardiovascular;  Laterality: N/A;   COLONOSCOPY  09/27/2010   per Dr. Sharlett Iles, benign polyp, no repeats needed    [place]  10/18/2010   Dr. Hassell Done attempted but could reduce this, the entire stomach is above the diaphragm   PORT-A-CATH REMOVAL N/A 05/18/2021   Procedure: REMOVAL PORT-A-CATH;  Surgeon: Donne Hazel,  Rodman Key, MD;  Location: [place];  Service: General;  Laterality: N/A;   PORTACATH PLACEMENT Right 01/25/2021   Procedure: INSERTION PORT-A-CATH;  Surgeon: [name], MD;  [facility];  Service: General;  Laterality: Right;  START TIME OF 8:30 AM FOR 60 MINUTES ROOM 4 [place] IQ   RADIOACTIVE SEED GUIDED AXILLARY SENTINEL LYMPH NODE Left 05/18/2021   Procedure: RADIOACTIVE SEED GUIDED LEFT AXILLARY NODE EXCISION;  Surgeon: [name], MD;  Location: [place];  Service: General;  Laterality: Left;   THYROIDECTOMY, PARTIAL  1999   TONSILLECTOMY       Current Outpatient Medications  Medication Sig  Dispense Refill   albuterol (VENTOLIN HFA) 108 (90 Base) MCG/ACT inhaler INHALE 2 PUFFS INTO THE LUNGS EVERY 4 HOURS AS NEEDED FOR WHEEZE OR FOR SHORTNESS OF BREATH 18 each 5   alendronate (FOSAMAX) 70 MG tablet Take 1 tablet (70 mg total) by mouth once a week. Take with a full glass of water on an empty stomach. 12 tablet 3   atorvastatin (LIPITOR) 10 MG tablet Take 1 tablet (10 mg total) by mouth daily. 90 tablet 3   flecainide (TAMBOCOR) 50 MG tablet TAKE 1 TABLET BY MOUTH TWICE A DAY 180 tablet 2   latanoprost (XALATAN) 0.005 % ophthalmic solution Place 1 drop into both eyes at bedtime.     letrozole (FEMARA) 2.5 MG tablet Take 1 tablet (2.5 mg total) by mouth daily. 90 tablet 3   levothyroxine (SYNTHROID) 100 MCG tablet Take 1 tablet (100 mcg total) by mouth daily. 90 tablet 3   metoprolol succinate (TOPROL-XL) 50 MG 24 hr tablet Take 50 mg by mouth daily. Take with or immediately following a meal.     Multiple Vitamin (MULTIVITAMIN WITH MINERALS) TABS tablet Take 1 tablet by mouth in the morning. Centrum for Women     omeprazole (PRILOSEC) 40 MG capsule TAKE 1 CAPSULE BY MOUTH EVERY DAY AS NEEDED FOR REFLUX 90 capsule 0   predniSONE (DELTASONE) 1 MG tablet Take 4 tablets (4 mg total) by mouth daily with breakfast.     timolol (TIMOPTIC) 0.5 % ophthalmic solution Place 1 drop into both eyes in the morning and at bedtime.     XARELTO 15 MG TABS tablet TAKE 1 TABLET (15 MG TOTAL) BY MOUTH DAILY WITH SUPPER. 90 tablet 1   No current facility-administered medications for this visit.    Allergies:   Codeine, Eliquis [apixaban], Meperidine hcl, Propoxyphene hcl, and Tape    Social History:  The patient  reports that she has never smoked. She has never used smokeless tobacco. She reports that she does not drink alcohol and does not use drugs.   Family History:  The patient's family history includes Breast cancer in her maternal grandmother; Cancer in her brother, father, and sister; Clotting  disorder in her brother; Colon cancer (age of onset: 78) in her son; Diabetes in her father; Hyperlipidemia in her father; Hypertension in her father; Kidney cancer in her mother.    ROS:  Please see the history of present illness. Otherwise, review of systems are positive for none.   All other systems are reviewed and negative.    PHYSICAL EXAM: VS:  BP (!) 156/102   Pulse (!) 59   Ht '5\' 7"'$  (1.702 m)   Wt 157 lb (71.2 kg)   SpO2 94%   BMI 24.59 kg/m  , BMI Body mass index is 24.59 kg/m.  Affect appropriate Healthy:  appears stated age 54: normal Neck supple with  no adenopathy JVP normal no bruits no thyromegaly Lungs clear with no wheezing and good diaphragmatic motion Heart:  S1/S2 no murmur, no rub, gallop or click PMI normal Abdomen: benighn, BS positve, no tenderness, no AAA no bruit.  No HSM or HJR Distal pulses intact with no bruits No edema Neuro non-focal Skin warm and dry No muscular weakness    EKG:  SR rate 61 nonspecific ST changes 01/23/21   Recent Labs: 11/20/2021: ALT 13; BUN 25; Hemoglobin 14.9; Platelets 159.0; Potassium 3.7; Sodium 140; TSH 0.97 02/02/2022: Creatinine, Ser 1.60    Lipid Panel    Component Value Date/Time   CHOL 180 11/20/2021 1058   TRIG 140.0 11/20/2021 1058   HDL 57.20 11/20/2021 1058   CHOLHDL 3 11/20/2021 1058   VLDL 28.0 11/20/2021 1058   LDLCALC 95 11/20/2021 1058   LDLCALC 91 08/23/2020 1144   LDLDIRECT 142.2 07/27/2013 1007      Wt Readings from Last 3 Encounters:  07/02/22 157 lb (71.2 kg)  05/15/22 155 lb (70.3 kg)  02/26/22 154 lb (69.9 kg)     ASSESSMENT AND PLAN:  1. PAF: -Maintaining NSR  -Continue Flecainide, Xarelto, and beta blocker  -No s/s of bleeding in stool or urine.  -CBC stable on recent labs -On reduced dose Xarelto due to renal dysfunction   2. CKD stage III: -Creatinine, 1.42   -Follows with nephrology      3.  HLD:  -Last LDL, 91 -Continue atorvastatin   4. HTN: -Add norvasc  Had been on diuretic in past but stopped by nephrology due to CKD   5. Breast cancer: -F/U oncology chemo/XRT Anti estrogen Letrozole Stage 3A    Current medicines are reviewed at length with the patient today.  The patient does not have concerns regarding medicines.  The following changes have been made:  Norvasc 5 mg daily   Labs/ tests ordered today include: NOne   No orders of the defined types were placed in this encounter.    Disposition:   FU in 6 months   Signed, Jenkins Rouge, MD  07/02/2022 8:57 AM    [facility] [address], [place], [place]  [address] Phone: [phone]; Fax: [phone]

## 2022-06-26 ENCOUNTER — Ambulatory Visit: Payer: Medicare HMO | Admitting: Physical Therapy

## 2022-06-26 ENCOUNTER — Encounter: Payer: Self-pay | Admitting: Physical Therapy

## 2022-06-26 DIAGNOSIS — C50412 Malignant neoplasm of upper-outer quadrant of left female breast: Secondary | ICD-10-CM

## 2022-06-26 DIAGNOSIS — I890 Lymphedema, not elsewhere classified: Secondary | ICD-10-CM | POA: Diagnosis not present

## 2022-06-26 NOTE — Therapy (Signed)
OUTPATIENT PHYSICAL THERAPY ONCOLOGY TREATMENT  Patient Name: Christina Jacobs MRN: [id number] DOB:03/03/1941, 81 y.o., female Today's Date: 06/26/2022   PT End of Session - 06/26/22 0906     Visit Number 7    Number of Visits 13    Date for PT Re-Evaluation 07/17/22    PT Start Time 0905    PT Stop Time 0953    PT Time Calculation (min) 48 min    Activity Tolerance Patient tolerated treatment well    Behavior During Therapy WFL for tasks assessed/performed              Past Medical History:  Diagnosis Date   Adenomatous colon polyp    Anal fissure    Anxiety    Asthma    Dr. Halford Chessman   Atrial fibrillation [facility])    Bowel obstruction ([place])    Bursitis    CKD (chronic kidney disease) stage 3, GFR 30-59 ml/min ([place])    sees Dr. [name]    Complication of anesthesia    post op N/V   Diverticulosis    Dyspnea    After a meal   Dysrhythmia    Afib   GERD (gastroesophageal reflux disease)    Glaucoma    Hiatal hernia    Giant type IV hiatal hernia; sees Dr. [name] at [facility]   Hyperlipidemia    Hypertension    Hypothyroidism    Myasthenia gravis ([place]) 2013   followed at [facility] Dr Scheryl Marten, diplopia only   Osteoarthritis    see's Dr. [name]   Osteoporosis    last DEXA 08-03-11   Past Surgical History:  Procedure Laterality Date   ABDOMINAL HYSTERECTOMY     APPENDECTOMY     BREAST LUMPECTOMY Bilateral     several, enign breast lumps removed   BREAST LUMPECTOMY WITH RADIOACTIVE SEED AND SENTINEL LYMPH NODE BIOPSY Left 05/18/2021   Procedure: LEFT BREAST LUMPECTOMY WITH RADIOACTIVE SEED AND LEFT AXILLARY SENTINEL LYMPH NODE BIOPSY;  Surgeon: [name], MD;  Location: [place];  Service: General;  Laterality: Left;   CARDIOVERSION N/A 12/31/2017   Procedure: CARDIOVERSION;  Surgeon: Josue Hector, MD;  Location: [place];  Service: Cardiovascular;  Laterality: N/A;   CARDIOVERSION N/A 02/11/2018   Procedure: CARDIOVERSION;  Surgeon:  Josue Hector, MD;  Location: [facility] ENDOSCOPY;  Service: Cardiovascular;  Laterality: N/A;   COLONOSCOPY  09/27/2010   per Dr. Sharlett Iles, benign polyp, no repeats needed    [place]  10/18/2010   Dr. Hassell Done attempted but could reduce this, the entire stomach is above the diaphragm   PORT-A-CATH REMOVAL N/A 05/18/2021   Procedure: REMOVAL PORT-A-CATH;  Surgeon: [name], MD;  Location: [place];  Service: General;  Laterality: N/A;   PORTACATH PLACEMENT Right 01/25/2021   Procedure: INSERTION PORT-A-CATH;  Surgeon: [name], MD;  [facility];  Service: General;  Laterality: Right;  START TIME OF 8:30 AM FOR 60 MINUTES ROOM 4 [place] IQ   RADIOACTIVE SEED GUIDED AXILLARY SENTINEL LYMPH NODE Left 05/18/2021   Procedure: RADIOACTIVE SEED GUIDED LEFT AXILLARY NODE EXCISION;  Surgeon: [name], MD;  Location: [place];  Service: General;  Laterality: Left;   THYROIDECTOMY, [place]     Patient Active Problem List   Diagnosis Date Noted   Port-A-Cath in place 01/26/2021   Malignant neoplasm of upper-outer quadrant of left breast in female, estrogen receptor positive ([place]) 01/02/2021  Paroxysmal atrial fibrillation ([place]) 08/19/2019   CKD (chronic kidney disease) stage 3, GFR 30-59 ml/min ([facility]) 08/09/2017   Hiatal hernia 08/09/2017   Glaucoma 08/08/2016   Restrictive lung disease 09/29/2013   Myasthenia gravis ([place]) 07/14/2012   HERNIA 07/17/2010   DIVERTICULOSIS, COLON 07/14/2010   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 level reflux 07/14/2010   EXTERNAL HEMORRHOIDS 07/13/2009   GERD 02/16/2009   Osteoarthrosis, unspecified whether generalized or localized, unspecified site 07/01/2008   MITRAL VALVE PROLAPSE 11/17/2007   Hypothyroidism 07/16/2007   Dyslipidemia 07/16/2007   Essential hypertension 07/16/2007   Osteoporosis 07/16/2007    PCP: Alysia Penna, MD  REFERRING PROVIDER: [name], MD    REFERRING DIAG: [phone] (ICD-10-CM) - Malignant neoplasm of upper-outer quadrant of left breast in female, estrogen receptor positive ([place])   THERAPY DIAG:  Lymphedema, not elsewhere classified  Malignant neoplasm of upper-outer quadrant of left breast in female, estrogen receptor positive ([place])  ONSET DATE: 04/19/22  Rationale for Evaluation and Treatment Rehabilitation  SUBJECTIVE                                                                                                                                                                                           SUBJECTIVE STATEMENT: I have been trying the massage.   PERTINENT HISTORY:  Patient was diagnosed on 12/06/2020 with left grade III invasive ductal carcinoma breast cancer. She underwent neoadjuvant chemotherapy from 01/26/2021-03/11/2021 followed by a left lumpectomy and sentinel node biopsy on 05/18/2021 with 1 of 2 lymph nodes being positive for cancer. It is ER positive, PR negative, and HER2 negative with a Ki67 of 20%. She has a hiatal hernia which is severe and limits her ability to exercise.   PAIN:  Are you having pain? No  PRECAUTIONS:  left UE Lymphedema risk, None   WEIGHT BEARING RESTRICTIONS No  FALLS:  Has patient fallen in last 6 months? No  LIVING ENVIRONMENT: Lives with: lives with their spouse Lives in: House/apartment Stairs: Yes; pt does not use the stairs Has following equipment at home: None  OCCUPATION: retired  LEISURE: pt does not exercise  HAND DOMINANCE : right   PRIOR LEVEL OF FUNCTION: Independent  PATIENT GOALS to get rid of the swelling in the breast   OBJECTIVE  COGNITION:  Overall cognitive status: Within functional limits for tasks assessed   PALPATION: Increased fibrosis in inferior medial L breast and all along inferior L breast  OBSERVATIONS / OTHER ASSESSMENTS: L breast larger than R breast with increased pore size surrounding L areola  POSTURE: forward head,  rounded shoulders  UPPER EXTREMITY AROM/PROM:  A/PROM RIGHT  eval   Shoulder extension   Shoulder flexion 149  Shoulder abduction 164  Shoulder internal rotation   Shoulder external rotation     (Blank rows = not tested)  A/PROM LEFT   eval  Shoulder extension   Shoulder flexion 147  Shoulder abduction 160  Shoulder internal rotation   Shoulder external rotation     (Blank rows = not tested)    LYMPHEDEMA ASSESSMENTS:   SURGERY TYPE/DATE: L lumpectomy and SLNB on 05/18/21  NUMBER OF LYMPH NODES REMOVED: 2 (1 was positive)  CHEMOTHERAPY: 01/26/21=03/11/21  RADIATION:completed  HORMONE TREATMENT: currently on letrozole  INFECTIONS: none  LYMPHEDEMA ASSESSMENTS:   LANDMARK RIGHT  eval  10 cm proximal to olecranon process 28.7  Olecranon process 22.5  10 cm proximal to ulnar styloid process 18.4  Just proximal to ulnar styloid process 14.2  Across hand at thumb web space 17.6  At base of 2nd digit 5.5  (Blank rows = not tested)  LANDMARK LEFT  eval  10 cm proximal to olecranon process 27.2  Olecranon process 23.1  10 cm proximal to ulnar styloid process 18  Just proximal to ulnar styloid process 13.1  Across hand at thumb web space 15.9  At base of 2nd digit 5.9  (Blank rows = not tested)   QUICK DASH SURVEY:    [facility] Breast: 15   TODAY'S TREATMENT  06/26/22: In supine with head supported on pillows as follows with pt demonstrating each of the following steps: short neck, 5 diaphragmatic breaths, R axillary nodes and establishment of interaxillary pathway, L inguinal nodes and establishment of axillo inguinal pathway, L breast moving fluid towards pathways  spending increased time on area of fibrosis then retracing all steps. Had pt return demonstrate correct technique and sequence today and gave verbal and tactile cues as needed for appropriate skin stretch.    06/21/22: In supine with head support on pillows as follows: short neck, 5  diaphragmatic breaths, R axillary nodes and establishment of interaxillary pathway, L inguinal nodes and establishment of axillo inguinal pathway, L breast moving fluid towards pathways  spending increased time on area of fibrosis then retracing all steps   06/14/22: In supine with head support on pillows as follows with pt demonstrating each of the following steps: short neck, 5 diaphragmatic breaths, R axillary nodes and establishment of interaxillary pathway, L inguinal nodes and establishment of axillo inguinal pathway, L breast moving fluid towards pathways  spending increased time on area of fibrosis then retracing all steps. Had pt return demonstrate correct technique and sequence today and gave verbal and tactile cues as needed for appropriate skin stretch.    06/14/22: In supine with head support on pillows as follows: short neck, 5 diaphragmatic breaths, R axillary nodes and establishment of interaxillary pathway, L inguinal nodes and establishment of axillo inguinal pathway, L breast moving fluid towards pathways  spending increased time on area of fibrosis then retracing all steps. Had pt return demonstrate correct technique and sequence today and gave verbal and tactile cues as needed for appropriate skin stretch.    06/12/22: In supine with head support on pillows as follows: short neck, 5 diaphragmatic breaths, R axillary nodes and establishment of interaxillary pathway, L inguinal nodes and establishment of axillo inguinal pathway, L breast moving fluid towards pathways  spending increased time on area of fibrosis then retracing all steps   05/30/22: In supine with head support on pillows as follows: short neck, 5 diaphragmatic breaths, R axillary nodes and  establishment of interaxillary pathway, L inguinal nodes and establishment of axillo inguinal pathway, L breast moving fluid towards pathways spending increased time on area of fibrosis then retracing all steps while educating pt in proper  skin stretch technique and sequence and having pt return demonstrate all steps  05/21/22: In supine with head support on pillows as follows: short neck, 5 diaphragmatic breaths, R axillary nodes and establishment of interaxillary pathway, L inguinal nodes and establishment of axillo inguinal pathway, L breast moving fluid towards pathways spending increased time on area of fibrosis then retracing all steps while educating pt in anatomy and physiology of the lymphatic system and basic principles of MLD; created foam chip pack for pt to wear in her bra  PATIENT EDUCATION:  Education details: anatomy and physiology of lymphatic system and basic principles of MLD, compression bra Person educated: Patient Education method: Explanation Education comprehension: verbalized understanding   HOME EXERCISE PROGRAM: Get compression bra  ASSESSMENT:  CLINICAL IMPRESSION: Pt is demonstrating improving independence with self massage. She still required verbal cueing to not perform a whole circle but a half circle stretch to direct the fluid. Her pressure and speed were good today. She will continue to practice correct technique at home. Pt has one more visit then will be ready to be discharged.  OBJECTIVE IMPAIRMENTS decreased knowledge of condition, increased edema, postural dysfunction, and pain.   ACTIVITY LIMITATIONS  none  PARTICIPATION LIMITATIONS:  none  PERSONAL FACTORS  none  are also affecting patient's functional outcome.   REHAB POTENTIAL: Excellent  CLINICAL DECISION MAKING: Stable/uncomplicated  EVALUATION COMPLEXITY: Low  GOALS: Goals reviewed with patient? Yes  SHORT TERM GOALS=LONG TERM GOALS Target date: 07/17/2022    Pt will be independent in self MLD for long term management of lymphedema.  Baseline: Goal status: IN PROGRESS 06/19/22- pt still does not feel independent with this  2.  Pt will obtain a compression bra for long term management of lymphedema. Baseline:  Goal  status: MET 06/14/22  3.  Pt will demonstrate improved tissue texture (no more fibrosis) in inferior L breast to decrease risk of infection.  Baseline:  Goal status: IN PROGRESS 06/19/22- pt is still demonstrating fibrosis though at times it improves   PLAN: PT FREQUENCY: 2x/week  PT DURATION: 4 weeks  PLANNED INTERVENTIONS: Therapeutic exercises, Patient/Family education, Self Care, Orthotic/Fit training, and Manual therapy  PLAN FOR NEXT SESSION: do SOZO, cont to instruct in self MLD, how was peach foam?   [employer], PT 06/26/2022, 9:59 AM

## 2022-06-28 ENCOUNTER — Encounter: Payer: Self-pay | Admitting: Physical Therapy

## 2022-06-28 ENCOUNTER — Ambulatory Visit: Payer: Medicare HMO | Admitting: Physical Therapy

## 2022-06-28 DIAGNOSIS — Z170 Estrogen receptor positive status [ER+]: Secondary | ICD-10-CM

## 2022-06-28 DIAGNOSIS — I890 Lymphedema, not elsewhere classified: Secondary | ICD-10-CM | POA: Diagnosis not present

## 2022-06-28 NOTE — Therapy (Signed)
OUTPATIENT PHYSICAL THERAPY ONCOLOGY TREATMENT  Patient Name: Christina Jacobs MRN: [id number] DOB:1941-08-26, 81 y.o., female Today's Date: 06/28/2022   PT End of Session - 06/28/22 1356     Visit Number 8    Number of Visits 13    Date for PT Re-Evaluation 07/17/22    PT Start Time 1304    PT Stop Time 3016    PT Time Calculation (min) 51 min    Activity Tolerance Patient tolerated treatment well    Behavior During Therapy WFL for tasks assessed/performed               Past Medical History:  Diagnosis Date   Adenomatous colon polyp    Anal fissure    Anxiety    Asthma    Dr. Halford Chessman   Atrial fibrillation [facility])    Bowel obstruction ([place])    Bursitis    CKD (chronic kidney disease) stage 3, GFR 30-59 ml/min ([place])    sees Dr. [name]    Complication of anesthesia    post op N/V   Diverticulosis    Dyspnea    After a meal   Dysrhythmia    Afib   GERD (gastroesophageal reflux disease)    Glaucoma    Hiatal hernia    Giant type IV hiatal hernia; sees Dr. [name] at [facility]   Hyperlipidemia    Hypertension    Hypothyroidism    Myasthenia gravis ([place]) 2013   followed at [facility] Dr Scheryl Marten, diplopia only   Osteoarthritis    see's Dr. [name]   Osteoporosis    last DEXA 08-03-11   Past Surgical History:  Procedure Laterality Date   ABDOMINAL HYSTERECTOMY     APPENDECTOMY     BREAST LUMPECTOMY Bilateral     several, enign breast lumps removed   BREAST LUMPECTOMY WITH RADIOACTIVE SEED AND SENTINEL LYMPH NODE BIOPSY Left 05/18/2021   Procedure: LEFT BREAST LUMPECTOMY WITH RADIOACTIVE SEED AND LEFT AXILLARY SENTINEL LYMPH NODE BIOPSY;  Surgeon: [name], MD;  Location: [place];  Service: General;  Laterality: Left;   CARDIOVERSION N/A 12/31/2017   Procedure: CARDIOVERSION;  Surgeon: Josue Hector, MD;  Location: [place];  Service: Cardiovascular;  Laterality: N/A;   CARDIOVERSION N/A 02/11/2018   Procedure: CARDIOVERSION;   Surgeon: Josue Hector, MD;  Location: [facility] ENDOSCOPY;  Service: Cardiovascular;  Laterality: N/A;   COLONOSCOPY  09/27/2010   per Dr. Sharlett Iles, benign polyp, no repeats needed    [place]  10/18/2010   Dr. Hassell Done attempted but could reduce this, the entire stomach is above the diaphragm   PORT-A-CATH REMOVAL N/A 05/18/2021   Procedure: REMOVAL PORT-A-CATH;  Surgeon: [name], MD;  Location: [place];  Service: General;  Laterality: N/A;   PORTACATH PLACEMENT Right 01/25/2021   Procedure: INSERTION PORT-A-CATH;  Surgeon: [name], MD;  [facility];  Service: General;  Laterality: Right;  START TIME OF 8:30 AM FOR 60 MINUTES ROOM 4 [place] IQ   RADIOACTIVE SEED GUIDED AXILLARY SENTINEL LYMPH NODE Left 05/18/2021   Procedure: RADIOACTIVE SEED GUIDED LEFT AXILLARY NODE EXCISION;  Surgeon: [name], MD;  Location: [place];  Service: General;  Laterality: Left;   THYROIDECTOMY, [place]     Patient Active Problem List   Diagnosis Date Noted   Port-A-Cath in place 01/26/2021   Malignant neoplasm of upper-outer quadrant of left breast in female, estrogen receptor positive ([place]) 01/02/2021  Paroxysmal atrial fibrillation ([place]) 08/19/2019   CKD (chronic kidney disease) stage 3, GFR 30-59 ml/min ([facility]) 08/09/2017   Hiatal hernia 08/09/2017   Glaucoma 08/08/2016   Restrictive lung disease 09/29/2013   Myasthenia gravis ([place]) 07/14/2012   HERNIA 07/17/2010   DIVERTICULOSIS, COLON 07/14/2010   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 level reflux 07/14/2010   EXTERNAL HEMORRHOIDS 07/13/2009   GERD 02/16/2009   Osteoarthrosis, unspecified whether generalized or localized, unspecified site 07/01/2008   MITRAL VALVE PROLAPSE 11/17/2007   Hypothyroidism 07/16/2007   Dyslipidemia 07/16/2007   Essential hypertension 07/16/2007   Osteoporosis 07/16/2007    PCP: Alysia Penna, MD  REFERRING PROVIDER: [name], MD   REFERRING DIAG: [phone] (ICD-10-CM) - Malignant neoplasm of upper-outer quadrant of left breast in female, estrogen receptor positive ([place])   THERAPY DIAG:  Lymphedema, not elsewhere classified  Malignant neoplasm of upper-outer quadrant of left breast in female, estrogen receptor positive ([place])  ONSET DATE: 04/19/22  Rationale for Evaluation and Treatment Rehabilitation  SUBJECTIVE                                                                                                                                                                                           SUBJECTIVE STATEMENT: The fibrosis softens when I do the massage but I have not been spending as much time on it as you do.    PERTINENT HISTORY:  Patient was diagnosed on 12/06/2020 with left grade III invasive ductal carcinoma breast cancer. She underwent neoadjuvant chemotherapy from 01/26/2021-03/11/2021 followed by a left lumpectomy and sentinel node biopsy on 05/18/2021 with 1 of 2 lymph nodes being positive for cancer. It is ER positive, PR negative, and HER2 negative with a Ki67 of 20%. She has a hiatal hernia which is severe and limits her ability to exercise.   PAIN:  Are you having pain? No  PRECAUTIONS:  left UE Lymphedema risk, None   WEIGHT BEARING RESTRICTIONS No  FALLS:  Has patient fallen in last 6 months? No  LIVING ENVIRONMENT: Lives with: lives with their spouse Lives in: House/apartment Stairs: Yes; pt does not use the stairs Has following equipment at home: None  OCCUPATION: retired  LEISURE: pt does not exercise  HAND DOMINANCE : right   PRIOR LEVEL OF FUNCTION: Independent  PATIENT GOALS to get rid of the swelling in the breast   OBJECTIVE  COGNITION:  Overall cognitive status: Within functional limits for tasks assessed   PALPATION: Increased fibrosis in inferior medial L breast and all along inferior L breast  OBSERVATIONS / OTHER ASSESSMENTS: L breast larger than  R breast with increased pore  size surrounding L areola  POSTURE: forward head, rounded shoulders  UPPER EXTREMITY AROM/PROM:  A/PROM RIGHT   eval   Shoulder extension   Shoulder flexion 149  Shoulder abduction 164  Shoulder internal rotation   Shoulder external rotation     (Blank rows = not tested)  A/PROM LEFT   eval  Shoulder extension   Shoulder flexion 147  Shoulder abduction 160  Shoulder internal rotation   Shoulder external rotation     (Blank rows = not tested)    LYMPHEDEMA ASSESSMENTS:   SURGERY TYPE/DATE: L lumpectomy and SLNB on 05/18/21  NUMBER OF LYMPH NODES REMOVED: 2 (1 was positive)  CHEMOTHERAPY: 01/26/21=03/11/21  RADIATION:completed  HORMONE TREATMENT: currently on letrozole  INFECTIONS: none  LYMPHEDEMA ASSESSMENTS:   LANDMARK RIGHT  eval  10 cm proximal to olecranon process 28.7  Olecranon process 22.5  10 cm proximal to ulnar styloid process 18.4  Just proximal to ulnar styloid process 14.2  Across hand at thumb web space 17.6  At base of 2nd digit 5.5  (Blank rows = not tested)  LANDMARK LEFT  eval  10 cm proximal to olecranon process 27.2  Olecranon process 23.1  10 cm proximal to ulnar styloid process 18  Just proximal to ulnar styloid process 13.1  Across hand at thumb web space 15.9  At base of 2nd digit 5.9  (Blank rows = not tested)   QUICK DASH SURVEY:    [facility] Breast: 15   TODAY'S TREATMENT  06/28/22: In supine with head support on pillows as follows: short neck, 5 diaphragmatic breaths, R axillary nodes and establishment of interaxillary pathway, L inguinal nodes and establishment of axillo inguinal pathway, L breast moving fluid towards pathways  spending increased time on area of fibrosis then retracing all steps  L-DEX LYMPHEDEMA SCREENING Measurement Type: Unilateral L-DEX MEASUREMENT EXTREMITY: Upper Extremity POSITION : Standing DOMINANT SIDE: Right At Risk Side: Left BASELINE SCORE  (UNILATERAL): -1 L-DEX SCORE (UNILATERAL): -2.6 VALUE CHANGE (UNILAT): -1.6  The patient was assessed using the L-Dex machine today to produce a lymphedema index baseline score. The patient will be reassessed on a regular basis (typically every 3 months) to obtain new L-Dex scores. If the score is > 6.5 points away from his/her baseline score indicating onset of subclinical lymphedema, it will be recommended to wear a compression garment for 4 weeks, 12 hours per day and then be reassessed. If the score continues to be > 6.5 points from baseline at reassessment, we will initiate lymphedema treatment. Assessing in this manner has a 95% rate of preventing clinically significant lymphedema.   06/26/22: In supine with head supported on pillows as follows with pt demonstrating each of the following steps: short neck, 5 diaphragmatic breaths, R axillary nodes and establishment of interaxillary pathway, L inguinal nodes and establishment of axillo inguinal pathway, L breast moving fluid towards pathways  spending increased time on area of fibrosis then retracing all steps. Had pt return demonstrate correct technique and sequence today and gave verbal and tactile cues as needed for appropriate skin stretch.    06/21/22: In supine with head support on pillows as follows: short neck, 5 diaphragmatic breaths, R axillary nodes and establishment of interaxillary pathway, L inguinal nodes and establishment of axillo inguinal pathway, L breast moving fluid towards pathways  spending increased time on area of fibrosis then retracing all steps    PATIENT EDUCATION:  Education details: anatomy and physiology of lymphatic system and basic principles of MLD, compression bra  Person educated: Patient Education method: Explanation Education comprehension: verbalized understanding   HOME EXERCISE PROGRAM: Get compression bra  ASSESSMENT:  CLINICAL IMPRESSION: Pt is feeling independent with self MLD. She reports  that the fibrosis improves when she does self MLD at home. She has now met all goals for PT and will be discharged. Will continue to assess pt every 3 months for the first 2 years post surgery using SOZO to assess for subclinical lymphedema.   OBJECTIVE IMPAIRMENTS decreased knowledge of condition, increased edema, postural dysfunction, and pain.   ACTIVITY LIMITATIONS  none  PARTICIPATION LIMITATIONS:  none  PERSONAL FACTORS  none  are also affecting patient's functional outcome.   REHAB POTENTIAL: Excellent  CLINICAL DECISION MAKING: Stable/uncomplicated  EVALUATION COMPLEXITY: Low  GOALS: Goals reviewed with patient? Yes  SHORT TERM GOALS=LONG TERM GOALS Target date: 07/17/2022    Pt will be independent in self MLD for long term management of lymphedema.  Baseline: Goal status: MET 06/19/22- pt still does not feel independent with this; 06/28/22- pt feels independent with this  2.  Pt will obtain a compression bra for long term management of lymphedema. Baseline:  Goal status: MET 06/14/22  3.  Pt will demonstrate improved tissue texture (no more fibrosis) in inferior L breast to decrease risk of infection.  Baseline:  Goal status: MET 06/19/22- pt is still demonstrating fibrosis though at times it improves; 06/28/22- pt still has fibrosis in inferior breast but it does improve greatly with MLD   PLAN: PT FREQUENCY: 2x/week  PT DURATION: 4 weeks  PLANNED INTERVENTIONS: Therapeutic exercises, Patient/Family education, Self Care, Orthotic/Fit training, and Manual therapy  PLAN FOR NEXT SESSION: d/c this session, continue Ldex screenings every 3 months for 2 years post surgery   Manus Gunning, PT 06/28/2022, 2:00 PM   PHYSICAL THERAPY DISCHARGE SUMMARY  Visits from Start of Care: 8  Current functional level related to goals / functional outcomes: All goals met   Remaining deficits: None   Education / Equipment: Self MLD, compression bra   Patient agrees  to discharge. Patient goals were met. Patient is being discharged due to meeting the stated rehab goals.  [facility] [place], [place] 06/28/22 2:00 PM

## 2022-07-02 ENCOUNTER — Ambulatory Visit: Payer: Medicare HMO | Attending: Cardiovascular Disease | Admitting: Cardiovascular Disease

## 2022-07-02 ENCOUNTER — Encounter: Payer: Self-pay | Admitting: Cardiovascular Disease

## 2022-07-02 ENCOUNTER — Ambulatory Visit: Payer: Medicare HMO

## 2022-07-02 VITALS — BP 156/102 | HR 59 | Ht 67.0 in | Wt 157.0 lb

## 2022-07-02 DIAGNOSIS — I480 Paroxysmal atrial fibrillation: Secondary | ICD-10-CM

## 2022-07-02 DIAGNOSIS — I10 Essential (primary) hypertension: Secondary | ICD-10-CM | POA: Diagnosis not present

## 2022-07-02 DIAGNOSIS — E785 Hyperlipidemia, unspecified: Secondary | ICD-10-CM | POA: Diagnosis not present

## 2022-07-02 MED ORDER — AMLODIPINE BESYLATE 5 MG PO TABS: 5.0000 mg | ORAL_TABLET | Freq: Every day | ORAL | 3 refills | Status: DC

## 2022-07-02 NOTE — Patient Instructions (Addendum)
Medication Instructions:  Your physician has recommended you make the following change in your medication:  1-START Amlodipine 5 mg by mouth daily.  *If you need a refill on your cardiac medications before your next appointment, please call your pharmacy*  Lab Work: If you have labs (blood work) drawn today and your tests are completely normal, you will receive your results only by: [place] (if you have MyChart) OR A paper copy in the mail If you have any lab test that is abnormal or we need to change your treatment, we will call you to review the results.  Testing/Procedures: None ordered today.  Follow-Up: At [facility], you and your health needs are our priority.  As part of our continuing mission to provide you with exceptional heart care, we have created designated Provider Care Teams.  These Care Teams include your primary Cardiologist (physician) and Advanced Practice Providers (APPs -  Physician Assistants and Nurse Practitioners) who all work together to provide you with the care you need, when you need it.  We recommend signing up for the patient portal called "MyChart".  Sign up information is provided on this After Visit Summary.  MyChart is used to connect with patients for Virtual Visits (Telemedicine).  Patients are able to view lab/test results, encounter notes, upcoming appointments, etc.  Non-urgent messages can be sent to your provider as well.   To learn more about what you can do with MyChart, go to [email or web address].    Your next appointment:   6 month(s)  The format for your next appointment:   In Person  Provider:   Jenkins Rouge, MD      Important Information About Sugar

## 2022-07-21 ENCOUNTER — Other Ambulatory Visit: Payer: Self-pay | Admitting: Family Medicine

## 2022-07-23 ENCOUNTER — Telehealth: Payer: Self-pay

## 2022-07-23 ENCOUNTER — Other Ambulatory Visit: Payer: Self-pay | Admitting: Gynecologic Oncology

## 2022-07-23 DIAGNOSIS — N83201 Unspecified ovarian cyst, right side: Secondary | ICD-10-CM

## 2022-07-23 NOTE — Telephone Encounter (Signed)
I spoke with the patient. Discussed options for monitoring the adnexal cyst (ultrasound or MRI). We discussed other treatment options including no follow-up (not my recommendation) and surgery. She had significant discomfort with the endovaginal portion of the pelvic ultrasound in 02/2022. I offered that we could do an abdominal US only (may or may not get decent images) or a pelvic MRI. Her preference is a pelvic MRI if we can find one that is not completely enclosed. I have asked my office to work on getting this scheduled.

## 2022-07-23 NOTE — Telephone Encounter (Signed)
Pt called today stating she has a transvaginal ultrasound scheduled for 10/17. She states she just had one a few months ago and it was very painful. She also states she had/has a hard time holding her urine. She is very "apprehensive" about the ultrasound and doesn't understand why it needs to be done. She also wants to know if there is any alternative testing?   Pt asked to speak to Dr.Tucker. I advised pt Dr. Berline Lopes is out of the office today but I would make her aware of pt's concerns.   Pt aware we could possibly schedule a phone visit with Berline Lopes to discuss concerns. She is fine with waiting for a return call either from the office with an appointment or from [place] just to discuss.

## 2022-07-24 ENCOUNTER — Encounter: Payer: Self-pay | Admitting: Hematology and Oncology

## 2022-07-25 ENCOUNTER — Telehealth: Payer: Self-pay | Admitting: *Deleted

## 2022-07-25 NOTE — Telephone Encounter (Signed)
Per Dr Berline Lopes patient needs to be scheduled with a MRI with open slides; Found MRI with open slides at [facility]. MRI order and insurance authorization will be sent to that office and they will call the patient to schedule scan.   Spoke with the patient and explained that Christina Jacobs will call her with an app; and to please call our office back with the appt date/time to follow up with results.

## 2022-07-25 NOTE — Telephone Encounter (Signed)
Per Dr Berline Lopes

## 2022-07-26 NOTE — Telephone Encounter (Signed)
Patient called yesterday and stated "I want to want on the MRI at [facility] for a little while since they had a data brench." Message forwarded to Dr Berline Lopes

## 2022-07-30 ENCOUNTER — Encounter: Payer: Self-pay | Admitting: Hematology and Oncology

## 2022-08-07 ENCOUNTER — Telehealth: Payer: Self-pay

## 2022-08-07 ENCOUNTER — Other Ambulatory Visit: Payer: Self-pay | Admitting: Gynecologic Oncology

## 2022-08-07 DIAGNOSIS — F40240 Claustrophobia: Secondary | ICD-10-CM

## 2022-08-07 DIAGNOSIS — F409 Phobic anxiety disorder, unspecified: Secondary | ICD-10-CM

## 2022-08-07 MED ORDER — ALPRAZOLAM 0.25 MG PO TABS: 0.2500 mg | ORAL_TABLET | Freq: Once | ORAL | 0 refills | Status: AC

## 2022-08-07 NOTE — Telephone Encounter (Addendum)
Patient has MRI on 08/21/22. She is requesting prescription to help with anxiety pre-procedure.   Called pt to advise that a prescription for Xanax had been sent to her pharmacy. Advised that she will need someone to drive her to MRI appointment.

## 2022-08-07 NOTE — Progress Notes (Signed)
See RN note about upcoming MRI and need to anti-anxiety med.

## 2022-08-08 ENCOUNTER — Telehealth: Payer: Self-pay | Admitting: *Deleted

## 2022-08-08 NOTE — Telephone Encounter (Signed)
Spoke with the patient and canceled her [place] scan and scheduled her for a phone visit to follow her MRI

## 2022-08-18 ENCOUNTER — Other Ambulatory Visit: Payer: Self-pay | Admitting: Hematology and Oncology

## 2022-08-18 ENCOUNTER — Other Ambulatory Visit: Payer: Self-pay | Admitting: Cardiovascular Disease

## 2022-08-20 NOTE — Telephone Encounter (Signed)
Prescription refill request for Xarelto received.  Indication:Afib Last office visit:8/23 Weight:71.2 kg Age:81 Scr:1.2 CrCl:41.33 ml/min  Prescription refilled

## 2022-08-21 ENCOUNTER — Ambulatory Visit (HOSPITAL_COMMUNITY): Payer: Medicare HMO

## 2022-08-21 ENCOUNTER — Ambulatory Visit
Admission: RE | Admit: 2022-08-21 | Discharge: 2022-08-21 | Disposition: A | Discharge status: Home / Self Care | Payer: Medicare HMO | Source: Ambulatory Visit | Attending: Gynecologic Oncology | Admitting: Gynecologic Oncology

## 2022-08-21 DIAGNOSIS — N83201 Unspecified ovarian cyst, right side: Secondary | ICD-10-CM

## 2022-08-21 MED ORDER — GADOPICLENOL 0.5 MMOL/ML IV SOLN
7.5000 mL | Freq: Once | INTRAVENOUS | Status: AC | PRN
  Administered 2022-08-21: 7.5 mL via INTRAVENOUS

## 2022-08-23 ENCOUNTER — Inpatient Hospital Stay: Payer: Medicare HMO | Attending: Gynecologic Oncology | Admitting: Gynecologic Oncology

## 2022-08-23 ENCOUNTER — Encounter: Payer: Self-pay | Admitting: Gynecologic Oncology

## 2022-08-23 DIAGNOSIS — N83201 Unspecified ovarian cyst, right side: Secondary | ICD-10-CM | POA: Diagnosis not present

## 2022-08-23 NOTE — Progress Notes (Signed)
Gynecologic Oncology Telehealth Note: Gyn-Onc  I connected with [name] on 08/23/22 at  4:45 PM EDT by telephone and verified that I am speaking with the correct person using two identifiers.  I discussed the limitations, risks, security and privacy concerns of performing an evaluation and management service by telemedicine and the availability of in-person appointments. I also discussed with the patient that there may be a patient responsible charge related to this service. The patient expressed understanding and agreed to proceed.  Other persons participating in the visit and their role in the encounter: none.  Patient's location: home Provider's location: [facility]  Reason for Visit: follow-up MRI  Treatment History: Oncology History  Malignant neoplasm of upper-outer quadrant of left breast in female, estrogen receptor positive ([place])  01/02/2021 Initial Diagnosis   Patient experienced chronic left breast tenderness for 2 years with a negative mammogram last year. Screening mammogram showed a 2.5cm central left breast mass and one enlarged lymph node. US showed a 2.6cm mass at the 12 o'clock position and a 1.3cm mass in the left axillary tail. 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 Donne Hazel: Residual IDC 2.2 cm, DCIS intermediate grade, margins negative, 1/2 lymph nodes positive ER 40% weak, PR negative, HER2 negative, Ki-67 20%   05/18/2021  Cancer Staging   Staging form: Breast, AJCC 8th Edition - Pathologic stage from 05/18/2021: No Stage Recommended (ypT2, pN1a, cM0, G2) - Signed by [name], NP on 10/31/2021 Stage prefix: Post-therapy Histologic grading system: 3 grade system   06/27/2021 - 08/15/2021 Radiation Therapy   Site Technique Total Dose (Gy) Dose per Fx (Gy) Completed Fx Beam Energies  Breast, Left: Breast_Lt 3D 50.4/50.4 1.8 28/28 6X, 10X  Breast, Left: Breast_Lt_SCLV 3D 50.4/50.4 1.8 28/28 6X, 10X  Breast, Left: Breast_Lt_Bst 3D 10/10 2 5/5 6X, 10X     08/19/2021 - 08/2028 Anti-estrogen oral therapy   Letrozole 1 mg daily     Interval History: Doing well. No new symptoms.  Past Medical/Surgical History: Past Medical History:  Diagnosis Date   Adenomatous colon polyp    Anal fissure    Anxiety    Asthma    Dr. Halford Chessman   Atrial fibrillation [facility])    Bowel obstruction (HCC)    Bursitis    CKD (chronic kidney disease) stage 3, GFR 30-59 ml/min (HCC)    sees Dr. [name]    Complication of anesthesia    post op N/V   Diverticulosis    Dyspnea    After a meal   Dysrhythmia    Afib   GERD (gastroesophageal reflux disease)    Glaucoma    Hiatal hernia    Giant type IV hiatal hernia; sees Dr. [name] at [facility]   Hyperlipidemia    Hypertension    Hypothyroidism    Myasthenia gravis [facility]) 2013   followed at [facility] Dr Scheryl Marten, diplopia only   Osteoarthritis  see's Dr. [name]   Osteoporosis    last DEXA 08-03-11    Past Surgical History:  Procedure Laterality Date   ABDOMINAL HYSTERECTOMY     APPENDECTOMY     BREAST LUMPECTOMY Bilateral     several, enign breast lumps removed   BREAST LUMPECTOMY WITH RADIOACTIVE SEED AND SENTINEL LYMPH NODE BIOPSY Left 05/18/2021   Procedure: LEFT BREAST LUMPECTOMY WITH RADIOACTIVE SEED AND LEFT AXILLARY SENTINEL LYMPH NODE BIOPSY;  Surgeon: [name], MD;  Location: [place];  Service: General;  Laterality: Left;    CARDIOVERSION N/A 12/31/2017   Procedure: CARDIOVERSION;  Surgeon: Josue Hector, MD;  Location: [facility] ENDOSCOPY;  Service: Cardiovascular;  Laterality: N/A;   CARDIOVERSION N/A 02/11/2018   Procedure: CARDIOVERSION;  Surgeon: Josue Hector, MD;  Location: [facility] ENDOSCOPY;  Service: Cardiovascular;  Laterality: N/A;   COLONOSCOPY  09/27/2010   per Dr. Sharlett Iles, benign polyp, no repeats needed    [place]  10/18/2010   Dr. Hassell Done attempted but could reduce this, the entire stomach is above the diaphragm   PORT-A-CATH REMOVAL N/A 05/18/2021   Procedure: REMOVAL PORT-A-CATH;  Surgeon: [name], MD;  Location: [place];  Service: General;  Laterality: N/A;   PORTACATH PLACEMENT Right 01/25/2021   Procedure: INSERTION PORT-A-CATH;  Surgeon: [name], MD;  Location: [facility];  Service: General;  Laterality: Right;  START TIME OF 8:30 AM FOR 60 MINUTES ROOM 4 [place] IQ   RADIOACTIVE SEED GUIDED AXILLARY SENTINEL LYMPH NODE Left 05/18/2021   Procedure: RADIOACTIVE SEED GUIDED LEFT AXILLARY NODE EXCISION;  Surgeon: [name], MD;  Location: [place];  Service: General;  Laterality: Left;   THYROIDECTOMY, PARTIAL  1999   TONSILLECTOMY      Family History  Problem Relation Age of Onset   Kidney cancer Mother    Diabetes Father    Hyperlipidemia Father    Hypertension Father    Cancer Father        origin unknown   Cancer Sister        2015, anal ca   Cancer Brother        Thinks it may have been "bowel cancer"   Clotting disorder Brother    Breast cancer Maternal Grandmother    Colon cancer Son 54    Social History   Socioeconomic History   Marital status: Married    Spouse name: Not on file   Number of children: 3   Years of education: Not on file   Highest education level: Not on file  Occupational History   Occupation: retired    Comment: [employer]  Tobacco Use   Smoking status: Never   Smokeless tobacco: Never  Vaping Use   Vaping  Use: Never used  Substance and Sexual Activity   Alcohol use: No    Alcohol/week: 0.0 standard drinks of alcohol   Drug use: No   Sexual activity: Not Currently  Other Topics Concern   Not on file  Social History Narrative   Married      Social Determinants of Health   Financial Resource Strain: Low Risk  (01/04/2021)   Overall Financial Resource Strain (CARDIA)    Difficulty of Paying Living Expenses: Not hard at all  Food Insecurity: No Food Insecurity (01/04/2021)   Hunger Vital Sign    Worried About Running Out of Food in the Last Year: Never true    [place] in the Last Year: Never true  Transportation  Needs: No Transportation Needs (01/04/2021)   PRAPARE - Hydrologist (Medical): No    Lack of Transportation (Non-Medical): No  Physical Activity: Not on file  Stress: Not on file  Social Connections: Not on file    Current Medications:  Current Outpatient Medications:    albuterol (VENTOLIN HFA) 108 (90 Base) MCG/ACT inhaler, INHALE 2 PUFFS INTO THE LUNGS EVERY 4 HOURS AS NEEDED FOR WHEEZE OR FOR SHORTNESS OF BREATH, Disp: 18 each, Rfl: 5   alendronate (FOSAMAX) 70 MG tablet, Take 1 tablet (70 mg total) by mouth once a week. Take with a full glass of water on an empty stomach., Disp: 12 tablet, Rfl: 3   amLODipine (NORVASC) 5 MG tablet, Take 1 tablet (5 mg total) by mouth daily., Disp: 90 tablet, Rfl: 3   atorvastatin (LIPITOR) 10 MG tablet, Take 1 tablet (10 mg total) by mouth daily., Disp: 90 tablet, Rfl: 3   flecainide (TAMBOCOR) 50 MG tablet, TAKE 1 TABLET BY MOUTH TWICE A DAY, Disp: 180 tablet, Rfl: 2   latanoprost (XALATAN) 0.005 % ophthalmic solution, Place 1 drop into both eyes at bedtime., Disp: , Rfl:    letrozole (FEMARA) 2.5 MG tablet, TAKE 1 TABLET BY MOUTH EVERY DAY, Disp: 90 tablet, Rfl: 3   levothyroxine (SYNTHROID) 100 MCG tablet, Take 1 tablet (100 mcg total) by mouth daily., Disp: 90 tablet, Rfl: 3   metoprolol succinate  (TOPROL-XL) 50 MG 24 hr tablet, Take 50 mg by mouth daily. Take with or immediately following a meal., Disp: , Rfl:    Multiple Vitamin (MULTIVITAMIN WITH MINERALS) TABS tablet, Take 1 tablet by mouth in the morning. Centrum for Women, Disp: , Rfl:    omeprazole (PRILOSEC) 40 MG capsule, TAKE 1 CAPSULE BY MOUTH EVERY DAY AS NEEDED FOR REFLUX, Disp: 90 capsule, Rfl: 0   predniSONE (DELTASONE) 1 MG tablet, Take 4 tablets (4 mg total) by mouth daily with breakfast., Disp: , Rfl:    timolol (TIMOPTIC) 0.5 % ophthalmic solution, Place 1 drop into both eyes in the morning and at bedtime., Disp: , Rfl:    XARELTO 15 MG TABS tablet, TAKE 1 TABLET (15 MG TOTAL) BY MOUTH DAILY WITH SUPPER, Disp: 90 tablet, Rfl: 1  Review of Symptoms: Pertinent positives as per HPI.  Physical Exam: Deferred given limitations of phone visit.  Laboratory & Radiologic Studies: MRI pelvis: IMPRESSION: Stable 4.5 cm simple right ovarian cyst. No follow up imaging recommended. Note: This recommendation does not apply to premenarchal patients and to those with increased risk (genetic, family history, elevated tumor markers or other high-risk factors) of ovarian cancer. Reference: JACR 2020 Feb; 17(2):248-254  Assessment & Plan: Christina Jacobs is a 81 y.o. woman with history of breast cancer with incidental findings of adnexal mass.   Patient doing well. Reviewed recent MRI which shows stable size of simple cyst. No features that would raise concern for possible malignancy. I do not think that routine imaging surveillance is indicated unless she were to develop new symptoms. I asked her to call my office if this were to happen.  I discussed the assessment and treatment plan with the patient. The patient was provided with an opportunity to ask questions and all were answered. The patient agreed with the plan and demonstrated an understanding of the instructions.   The patient was advised to call back or see an in-person  evaluation if the symptoms worsen or if the condition fails to improve as anticipated.  6 minutes of total time was spent for this patient encounter, including preparation, phone counseling with the patient and coordination of care, and documentation of the encounter.   Jeral Pinch, MD  Division of Gynecologic Oncology  Department of Obstetrics and Gynecology  [facility]

## 2022-09-10 ENCOUNTER — Encounter: Payer: Self-pay | Admitting: Family Medicine

## 2022-09-10 ENCOUNTER — Ambulatory Visit (INDEPENDENT_AMBULATORY_CARE_PROVIDER_SITE_OTHER): Payer: Medicare HMO | Admitting: Family Medicine

## 2022-09-10 VITALS — BP 142/96 | HR 63 | Wt 152.5 lb

## 2022-09-10 DIAGNOSIS — B029 Zoster without complications: Secondary | ICD-10-CM

## 2022-09-10 MED ORDER — VALACYCLOVIR HCL 1 G PO TABS
1000.0000 mg | ORAL_TABLET | Freq: Three times a day (TID) | ORAL | 0 refills | Status: DC
Start: 2022-09-10 — End: 2023-02-11

## 2022-09-10 NOTE — Progress Notes (Signed)
   Subjective:    Patient ID: Christina Jacobs, female    DOB: 04/06/1941, 81 y.o.   MRN: [id number]  HPI Here for 3 days of a burning rash on the right flank. She has had shingles twice before.    Review of Systems  Constitutional: Negative.   Respiratory: Negative.    Cardiovascular: Negative.   Skin:  Positive for rash.       Objective:   Physical Exam Constitutional:      Appearance: Normal appearance.  Cardiovascular:     Rate and Rhythm: Normal rate and regular rhythm.     Pulses: Normal pulses.     Heart sounds: Normal heart sounds.  Pulmonary:     Effort: Pulmonary effort is normal.  Skin:    Comments: There are patches of red vesicles on the right flank   Neurological:     Mental Status: She is alert.           Assessment & Plan:  Shingles, treat with 10 days of Valtrex. Alysia Penna, MD

## 2022-09-11 ENCOUNTER — Other Ambulatory Visit: Payer: Self-pay | Admitting: Family Medicine

## 2022-09-11 ENCOUNTER — Telehealth: Payer: Self-pay | Admitting: Family Medicine

## 2022-09-11 MED ORDER — LIDOCAINE 5 % EX PTCH: 1.0000 | MEDICATED_PATCH | CUTANEOUS | 2 refills | Status: DC

## 2022-09-11 NOTE — Telephone Encounter (Signed)
Lvm for pt husband prescription sent.

## 2022-09-11 NOTE — Telephone Encounter (Signed)
Pt husband call and stated pt want to get the patch for the shingles that they talk about he stated she saw dr.Fry yesterday.

## 2022-09-11 NOTE — Telephone Encounter (Signed)
I sent a RX for these to her pharmacy

## 2022-09-12 NOTE — Telephone Encounter (Signed)
Contacted pt husband Juanda Crumble, lvm  concerning questions about prescription

## 2022-09-13 NOTE — Telephone Encounter (Signed)
Spoke with patients husband concerning Lidocaine 5% patches.     [facility] cover [facility] 10 patches at discount cost of $31.14.    Patients husband Christina Jacobs will contact office if patient receives no relief with prescription patches.

## 2022-09-23 IMAGING — RF DG C-ARM 1-60 MIN
1 series · 2 of 2 positions shown · IV contrast (agent unspecified)
Comparison: None.

CLINICAL DATA: Port-A-Cath placement.

EXAM:
DG C-ARM 1-60 MIN
CONTRAST:  None.
FLUOROSCOPY TIME:  Fluoroscopy Time:  Not reported.
Radiation Exposure Index (if provided by the fluoroscopic device):
Not reported.
Number of Acquired Spot Images: 2.

[Series 1: run · 2 of 2 slices shown]
[im 1/2]
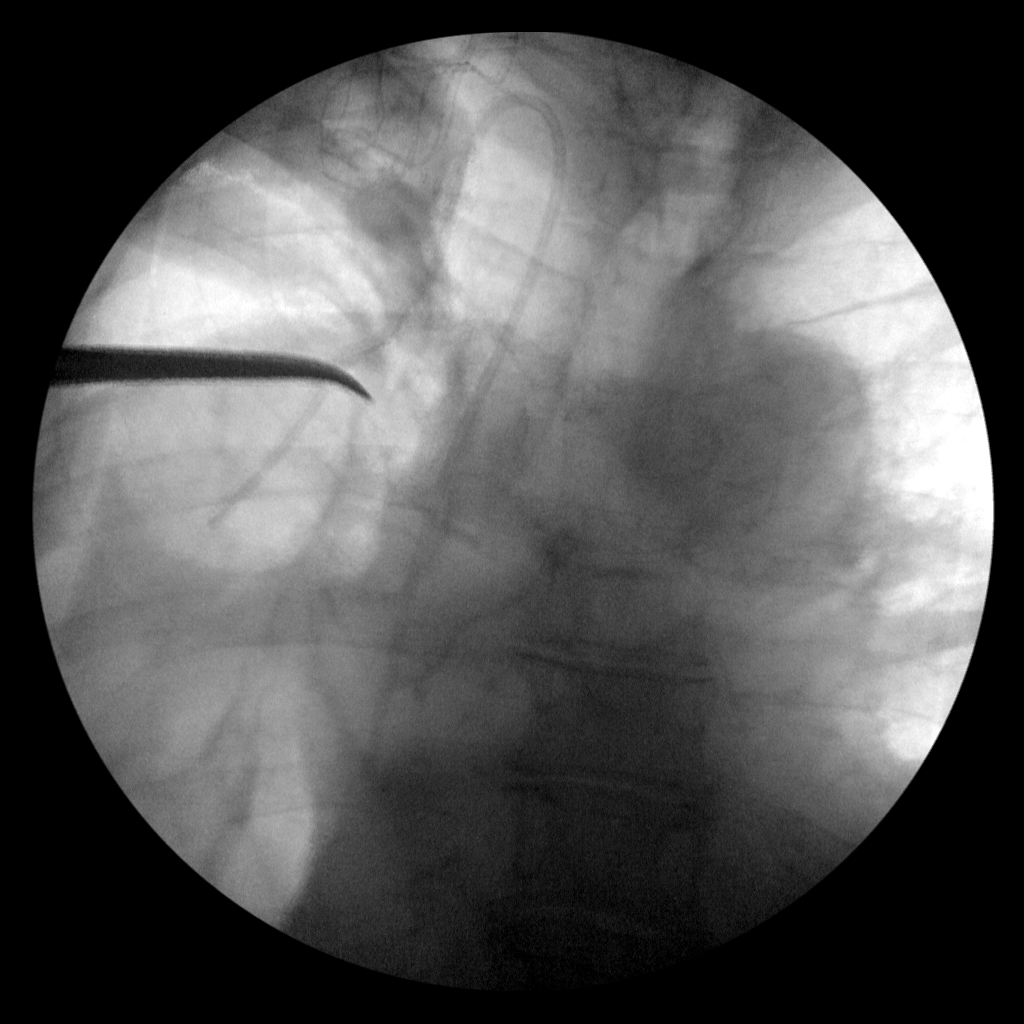
[im 2/2]
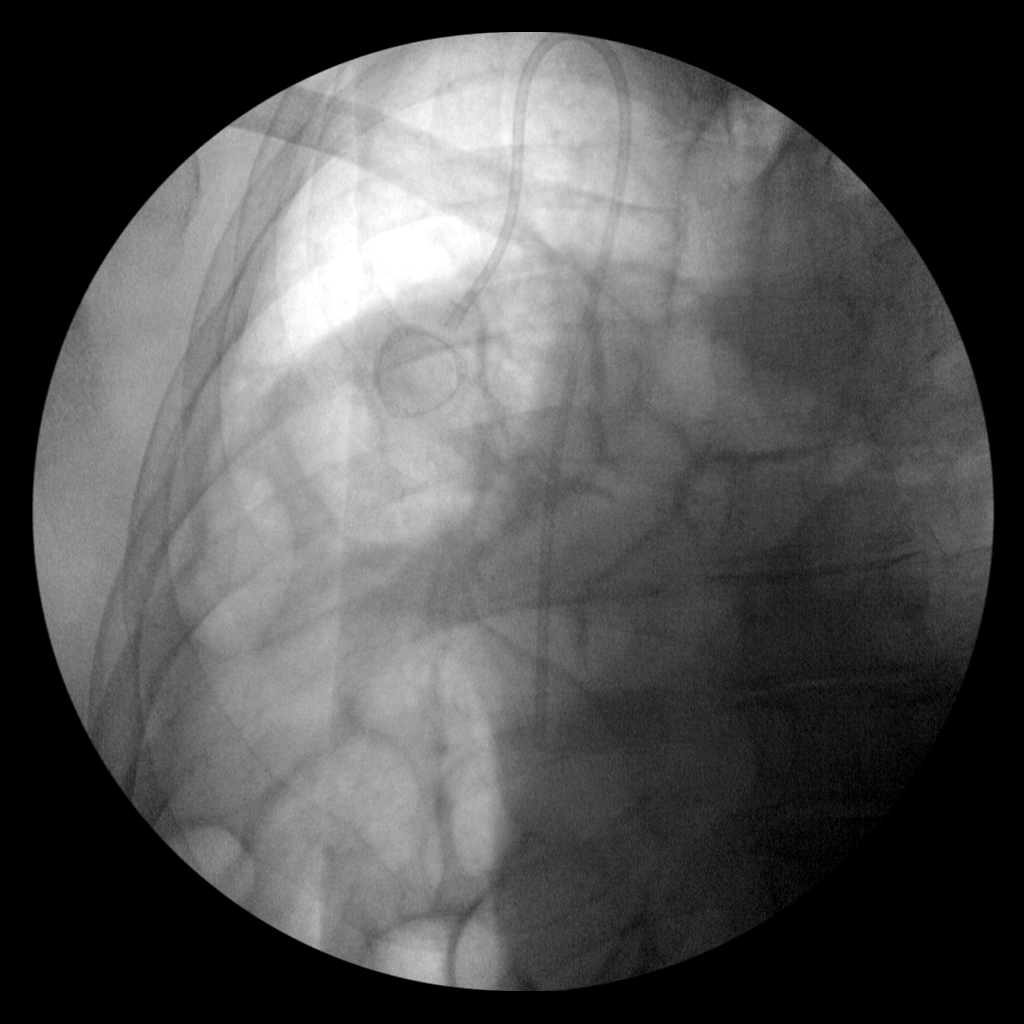

[2 of 2 positions shown; findings below may reference images not displayed]

FINDINGS: Two intraoperative fluoroscopic images demonstrate right internal
jugular Port-A-Cath with distal tip in expected position of
cavoatrial junction.
IMPRESSION: Fluoroscopic guidance provided during right internal jugular
Port-A-Cath placement.

## 2022-10-01 ENCOUNTER — Ambulatory Visit: Payer: Medicare HMO | Attending: General Surgery

## 2022-10-01 VITALS — Wt 153.4 lb

## 2022-10-01 DIAGNOSIS — Z483 Aftercare following surgery for neoplasm: Secondary | ICD-10-CM | POA: Insufficient documentation

## 2022-10-01 NOTE — Therapy (Signed)
OUTPATIENT PHYSICAL THERAPY SOZO SCREENING NOTE   Patient Name: Christina Jacobs MRN: [id number] DOB:1941/05/29, 81 y.o., female Today's Date: 10/01/2022  PCP: Laurey Morale, MD REFERRING PROVIDER: [name], MD   PT End of Session - 10/01/22 1528     Visit Number 8   # unchanged due to screen only   PT Start Time 1525    PT Stop Time 1530    PT Time Calculation (min) 5 min    Activity Tolerance Patient tolerated treatment well    Behavior During Therapy WFL for tasks assessed/performed             Past Medical History:  Diagnosis Date   Adenomatous colon polyp    Anal fissure    Anxiety    Asthma    Dr. Halford Chessman   Atrial fibrillation [facility])    Bowel obstruction ([place])    Bursitis    CKD (chronic kidney disease) stage 3, GFR 30-59 ml/min ([place])    sees Dr. [name]    Complication of anesthesia    post op N/V   Diverticulosis    Dyspnea    After a meal   Dysrhythmia    Afib   GERD (gastroesophageal reflux disease)    Glaucoma    Hiatal hernia    Giant type IV hiatal hernia; sees Dr. [name] at [facility]   Hyperlipidemia    Hypertension    Hypothyroidism    Myasthenia gravis ([place]) 2013   followed at [facility] Dr Scheryl Marten, diplopia only   Osteoarthritis    see's Dr. [name]   Osteoporosis    last DEXA 08-03-11   Past Surgical History:  Procedure Laterality Date   ABDOMINAL HYSTERECTOMY     APPENDECTOMY     BREAST LUMPECTOMY Bilateral     several, enign breast lumps removed   BREAST LUMPECTOMY WITH RADIOACTIVE SEED AND SENTINEL LYMPH NODE BIOPSY Left 05/18/2021   Procedure: LEFT BREAST LUMPECTOMY WITH RADIOACTIVE SEED AND LEFT AXILLARY SENTINEL LYMPH NODE BIOPSY;  Surgeon: [name], MD;  Location: [place];  Service: General;  Laterality: Left;   CARDIOVERSION N/A 12/31/2017   Procedure: CARDIOVERSION;  Surgeon: Josue Hector, MD;  Location: [place];  Service: Cardiovascular;  Laterality: N/A;   CARDIOVERSION N/A  02/11/2018   Procedure: CARDIOVERSION;  Surgeon: Josue Hector, MD;  Location: [facility] ENDOSCOPY;  Service: Cardiovascular;  Laterality: N/A;   COLONOSCOPY  09/27/2010   per Dr. Sharlett Iles, benign polyp, no repeats needed    [place]  10/18/2010   Dr. Hassell Done attempted but could reduce this, the entire stomach is above the diaphragm   PORT-A-CATH REMOVAL N/A 05/18/2021   Procedure: REMOVAL PORT-A-CATH;  Surgeon: [name], MD;  Location: [place];  Service: General;  Laterality: N/A;   PORTACATH PLACEMENT Right 01/25/2021   Procedure: INSERTION PORT-A-CATH;  Surgeon: [name], MD;  Location: [facility];  Service: General;  Laterality: Right;  START TIME OF 8:30 AM FOR 60 MINUTES ROOM 4 [place] IQ   RADIOACTIVE SEED GUIDED AXILLARY SENTINEL LYMPH NODE Left 05/18/2021   Procedure: RADIOACTIVE SEED GUIDED LEFT AXILLARY NODE EXCISION;  Surgeon: [name], MD;  Location: [place];  Service: General;  Laterality: Left;   THYROIDECTOMY, [place]     Patient Active Problem List   Diagnosis Date Noted   Port-A-Cath in place 01/26/2021   Malignant neoplasm of upper-outer quadrant of left breast in female, estrogen receptor positive (  [place]) 01/02/2021   Paroxysmal atrial fibrillation ([place]) 08/19/2019   CKD (chronic kidney disease) stage 3, GFR 30-59 ml/min (HCC) 08/09/2017   Hiatal hernia 08/09/2017   Glaucoma 08/08/2016   Restrictive lung disease 09/29/2013   Myasthenia gravis ([place]) 07/14/2012   HERNIA 07/17/2010   DIVERTICULOSIS, COLON 07/14/2010   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 level reflux 07/14/2010   EXTERNAL HEMORRHOIDS 07/13/2009   GERD 02/16/2009   Osteoarthrosis, unspecified whether generalized or localized, unspecified site 07/01/2008   MITRAL VALVE PROLAPSE 11/17/2007   Hypothyroidism 07/16/2007   Dyslipidemia 07/16/2007   Essential hypertension 07/16/2007   Osteoporosis 07/16/2007    REFERRING  DIAG: left breast cancer at risk for lymphedema  THERAPY DIAG: Aftercare following surgery for neoplasm  PERTINENT HISTORY: Patient was diagnosed on 12/06/2020 with left grade III invasive ductal carcinoma breast cancer. She underwent neoadjuvant chemotherapy from 01/26/2021-03/11/2021 followed by a left lumpectomy and sentinel node biopsy on 05/18/2021 with 1 of 2 lymph nodes being positive for cancer. It is ER positive, PR negative, and HER2 negative with a Ki67 of 20%. She has a hiatal hernia which is severe and limits her ability to exercise   PRECAUTIONS: left UE Lymphedema risk, None  SUBJECTIVE: Pt returns for her 3 month L-Dex screen .  PAIN:  Are you having pain? No.  SOZO SCREENING: Patient was assessed today using the SOZO machine to determine the lymphedema index score. This was compared to her baseline score. It was determined that she is within the recommended range when compared to her baseline and no further action is needed at this time. She will continue SOZO screenings. These are done every 3 months for 2 years post operatively followed by every 6 months for 2 years, and then annually.   L-DEX FLOWSHEETS - 10/01/22 1500       L-DEX LYMPHEDEMA SCREENING   Measurement Type Unilateral    L-DEX MEASUREMENT EXTREMITY Upper Extremity    POSITION  Standing    DOMINANT SIDE Right    At Risk Side Left    BASELINE SCORE (UNILATERAL) -1    L-DEX SCORE (UNILATERAL) 3.9    VALUE CHANGE (UNILAT) 4.9               Otelia Limes, PTA 10/01/2022, 3:33 PM

## 2022-10-03 ENCOUNTER — Telehealth: Payer: Self-pay | Admitting: Family Medicine

## 2022-10-03 NOTE — Telephone Encounter (Signed)
I would have her drink fluids and keep an eye on this. If she starts having a ST or cough, etc, she should make an OV

## 2022-10-03 NOTE — Telephone Encounter (Signed)
Pt husband Juanda Crumble is calling pt was dx with shingles on 09-10-2022 and last night pt is having  achiness and temp 98.2. Please advise  [id number] [address] [facility] Phone: [phone] Fax: [phone]

## 2022-10-03 NOTE — Telephone Encounter (Signed)
Please advise 

## 2022-10-04 NOTE — Telephone Encounter (Signed)
Called and spoke with patient's husband Juanda Crumble, informed him of the message.  He stated that the previous symptoms the patient was having has subsided.      Pt 's husband asked when can the patient get the Flu and Shingles shot.     He stated that all the shingles on the patient has disappeared.     Please advise.

## 2022-10-05 NOTE — Telephone Encounter (Signed)
I would wait for 2 weeks before getting the flu shot or shingles shot

## 2022-10-05 NOTE — Telephone Encounter (Signed)
Spoke with pt verbalized understanding of Dr Fry advise 

## 2022-10-23 ENCOUNTER — Other Ambulatory Visit: Payer: Self-pay | Admitting: Family Medicine

## 2022-11-04 ENCOUNTER — Other Ambulatory Visit: Payer: Self-pay | Admitting: Family Medicine

## 2022-11-06 ENCOUNTER — Encounter: Payer: Self-pay | Admitting: Cardiovascular Disease

## 2022-11-06 ENCOUNTER — Telehealth: Payer: Self-pay

## 2022-11-06 NOTE — Telephone Encounter (Signed)
Per patient's note "I am having water retention in my feet and ankles and was wondering if I could take a few of Charlie's water pills until I see Dr. Sarajane Jews for a physical 1/23 ". On review of chart, Dr. Kyla Balzarine note from 07/02/22 states that patient had been on diuretic but was taken off by nephrologist  due to CKD. Last labs 11/20/21 Cr was 1.54. Patient denies increased SOB, states she is sometimes SOB due to "very large hiatal hernia" but can do all her household chores, is able to perform her usual ADL's. She states her feet have been swollen for about 2-3 weeks but she can still wear her regular shoes.   Advised patient to contact nephrologist about restarting diuretic and advised not to take her husband's diuretic pills until she has spoked to Nephrologist as MD may want to check her labs first. Patient verbalizes understanding.

## 2022-11-12 ENCOUNTER — Other Ambulatory Visit: Payer: Self-pay | Admitting: Family Medicine

## 2022-11-16 ENCOUNTER — Other Ambulatory Visit: Payer: Self-pay | Admitting: Family Medicine

## 2022-11-21 ENCOUNTER — Other Ambulatory Visit: Payer: Self-pay | Admitting: *Deleted

## 2022-11-21 DIAGNOSIS — C50412 Malignant neoplasm of upper-outer quadrant of left female breast: Secondary | ICD-10-CM

## 2022-11-21 NOTE — Progress Notes (Signed)
Per pt request, orders for mammogram faxed to [facility].

## 2022-11-27 ENCOUNTER — Encounter: Payer: Self-pay | Admitting: Family Medicine

## 2022-11-27 ENCOUNTER — Ambulatory Visit (INDEPENDENT_AMBULATORY_CARE_PROVIDER_SITE_OTHER): Payer: Medicare HMO | Admitting: Family Medicine

## 2022-11-27 VITALS — BP 132/84 | HR 80 | Temp 97.9°F | Ht 67.0 in | Wt 150.2 lb

## 2022-11-27 DIAGNOSIS — R739 Hyperglycemia, unspecified: Secondary | ICD-10-CM | POA: Diagnosis not present

## 2022-11-27 DIAGNOSIS — J4530 Mild persistent asthma, uncomplicated: Secondary | ICD-10-CM

## 2022-11-27 DIAGNOSIS — N183 Chronic kidney disease, stage 3 unspecified: Secondary | ICD-10-CM

## 2022-11-27 DIAGNOSIS — I10 Essential (primary) hypertension: Secondary | ICD-10-CM | POA: Diagnosis not present

## 2022-11-27 DIAGNOSIS — G7000 Myasthenia gravis without (acute) exacerbation: Secondary | ICD-10-CM

## 2022-11-27 DIAGNOSIS — K219 Gastro-esophageal reflux disease without esophagitis: Secondary | ICD-10-CM

## 2022-11-27 DIAGNOSIS — M810 Age-related osteoporosis without current pathological fracture: Secondary | ICD-10-CM

## 2022-11-27 DIAGNOSIS — E039 Hypothyroidism, unspecified: Secondary | ICD-10-CM | POA: Diagnosis not present

## 2022-11-27 DIAGNOSIS — M159 Polyosteoarthritis, unspecified: Secondary | ICD-10-CM

## 2022-11-27 DIAGNOSIS — E785 Hyperlipidemia, unspecified: Secondary | ICD-10-CM | POA: Diagnosis not present

## 2022-11-27 DIAGNOSIS — J984 Other disorders of lung: Secondary | ICD-10-CM

## 2022-11-27 DIAGNOSIS — I480 Paroxysmal atrial fibrillation: Secondary | ICD-10-CM

## 2022-11-27 DIAGNOSIS — H409 Unspecified glaucoma: Secondary | ICD-10-CM

## 2022-11-27 LAB — HEPATIC FUNCTION PANEL
ALT: 15 U/L (ref 0–35)
AST: 16 U/L (ref 0–37)
Albumin: 3.8 g/dL (ref 3.5–5.2)
Alkaline Phosphatase: 48 U/L (ref 39–117)
Bilirubin, Direct: 0.2 mg/dL (ref 0.0–0.3)
Total Bilirubin: 1.1 mg/dL (ref 0.2–1.2)
Total Protein: 6.8 g/dL (ref 6.0–8.3)

## 2022-11-27 LAB — CBC WITH DIFFERENTIAL/PLATELET
Basophils Absolute: 0 10*3/uL (ref 0.0–0.1)
Basophils Relative: 0.5 % (ref 0.0–3.0)
Eosinophils Absolute: 0 10*3/uL (ref 0.0–0.7)
Eosinophils Relative: 0.7 % (ref 0.0–5.0)
HCT: 45.9 % (ref 36.0–46.0)
Hemoglobin: 15.3 g/dL — ABNORMAL HIGH (ref 12.0–15.0)
Lymphocytes Relative: 18.8 % (ref 12.0–46.0)
Lymphs Abs: 1.2 10*3/uL (ref 0.7–4.0)
MCHC: 33.3 g/dL (ref 30.0–36.0)
MCV: 92.4 fl (ref 78.0–100.0)
Monocytes Absolute: 0.5 10*3/uL (ref 0.1–1.0)
Monocytes Relative: 8.5 % (ref 3.0–12.0)
Neutro Abs: 4.5 10*3/uL (ref 1.4–7.7)
Neutrophils Relative %: 71.5 % (ref 43.0–77.0)
Platelets: 161 10*3/uL (ref 150.0–400.0)
RBC: 4.97 Mil/uL (ref 3.87–5.11)
RDW: 15.4 % (ref 11.5–15.5)
WBC: 6.3 10*3/uL (ref 4.0–10.5)

## 2022-11-27 LAB — BASIC METABOLIC PANEL
BUN: 26 mg/dL — ABNORMAL HIGH (ref 6–23)
CO2: 31 mEq/L (ref 19–32)
Calcium: 9.4 mg/dL (ref 8.4–10.5)
Chloride: 104 mEq/L (ref 96–112)
Creatinine, Ser: 1.39 mg/dL — ABNORMAL HIGH (ref 0.40–1.20)
GFR: 35.56 mL/min — ABNORMAL LOW (ref >60.00)
Glucose, Bld: 89 mg/dL (ref 70–99)
Potassium: 4 mEq/L (ref 3.5–5.1)
Sodium: 144 mEq/L (ref 135–145)

## 2022-11-27 LAB — HEMOGLOBIN A1C: Hgb A1c MFr Bld: 6 % (ref 4.6–6.5)

## 2022-11-27 LAB — T4, FREE: Free T4: 1.43 ng/dL (ref 0.60–1.60)

## 2022-11-27 LAB — TSH: TSH: 0.94 u[IU]/mL (ref 0.35–5.50)

## 2022-11-27 LAB — LIPID PANEL
Cholesterol: 200 mg/dL (ref 0–200)
HDL: 59.9 mg/dL (ref >39.00)
LDL Cholesterol: 117 mg/dL — ABNORMAL HIGH (ref 0–99)
NonHDL: 139.83
Total CHOL/HDL Ratio: 3
Triglycerides: 115 mg/dL (ref 0.0–149.0)
VLDL: 23 mg/dL (ref 0.0–40.0)

## 2022-11-27 LAB — T3, FREE: T3, Free: 2.9 pg/mL (ref 2.3–4.2)

## 2022-11-27 MED ORDER — TRIAMTERENE-HCTZ 37.5-25 MG PO TABS: 1.0000 | ORAL_TABLET | Freq: Every day | ORAL | 3 refills | Status: DC

## 2022-11-27 NOTE — Progress Notes (Signed)
Subjective:    Patient ID: DOT SPLINTER, female    DOB: 10-Mar-1941, 82 y.o.   MRN: [id number]  HPI Here to follow up on issues. She is doing well in general. Her HTN and atrial fibrillation are stable. 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 Her myasthenia gravis is stable. Her glaucoma and GERD are stable.    Review of Systems  Constitutional: Negative.   HENT: Negative.    Eyes: Negative.   Respiratory: Negative.    Cardiovascular: Negative.   Gastrointestinal: Negative.   Genitourinary:  Negative for decreased urine volume, difficulty urinating, dyspareunia, dysuria, enuresis, flank pain, frequency, hematuria, pelvic pain and urgency.  Musculoskeletal: Negative.   Skin: Negative.   Neurological: Negative.  Negative for headaches.  Psychiatric/Behavioral: Negative.         Objective:   Physical Exam Constitutional:      General: She is not in acute distress.    Appearance: Normal appearance. She is well-developed.  HENT:     Head: Normocephalic and atraumatic.     Right Ear: External ear normal.     Left Ear: External ear normal.     Nose: Nose normal.     Mouth/Throat:     Pharynx: No oropharyngeal exudate.  Eyes:     General: No scleral icterus.    Conjunctiva/sclera: Conjunctivae normal.     Pupils: Pupils are equal, round, and reactive to light.  Neck:     Thyroid: No thyromegaly.     Vascular: No JVD.  Cardiovascular:     Rate and Rhythm: Normal rate and regular rhythm.     Heart sounds: Normal heart sounds. No murmur heard.    No friction rub. No gallop.  Pulmonary:     Effort: Pulmonary effort is normal. No respiratory distress.     Breath sounds: Normal breath sounds. No wheezing or rales.  Chest:     Chest wall: No tenderness.  Abdominal:     General: Bowel sounds are normal. There is no distension.     Palpations: Abdomen is soft. There is no mass.     Tenderness: There is no abdominal tenderness. There is no guarding or rebound.   Musculoskeletal:        General: No tenderness. Normal range of motion.     Cervical back: Normal range of motion and neck supple.  Lymphadenopathy:     Cervical: No cervical adenopathy.  Skin:    General: Skin is warm and dry.     Findings: No erythema or rash.  Neurological:     General: No focal deficit present.     Mental Status: She is alert and oriented to person, place, and time.     Cranial Nerves: No cranial nerve deficit.     Motor: No abnormal muscle tone.     Coordination: Coordination normal.     Deep Tendon Reflexes: Reflexes are normal and symmetric. Reflexes normal.  Psychiatric:        Mood and Affect: Mood normal.        Behavior: Behavior normal.        Thought Content: Thought content normal.        Judgment: Judgment normal.           Assessment & Plan:  Her atrial fibrillation and HTN are stable. Her GERD and myasthenia gravis are stable. 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 She recently saw her nephrologist, Dr. Moshe Cipro, and she will see her cardiologist, Dr.  Nishan, in February. We will get fasting labs to check lipids, A1c, etc. We spent a total of (34   ) minutes reviewing records and discussing these issues.  Alysia Penna, MD

## 2022-11-28 MED ORDER — FUROSEMIDE 20 MG PO TABS: 20.0000 mg | ORAL_TABLET | Freq: Every day | ORAL | 3 refills | Status: AC | PRN

## 2022-11-28 NOTE — Telephone Encounter (Signed)
Noted. I added this to her med list

## 2022-12-05 NOTE — Progress Notes (Signed)
Cardiology Office Note   Date:  12/12/2022   ID:  Christina Jacobs, DOB 10/09/1941, MRN [id number]  PCP:  Laurey Morale, MD  Cardiologist:  Dr. Johnsie Cancel, MD  No chief complaint on file.   History of Present Illness: Christina Jacobs is a 82 y.o. female who presents for follow-up of paroxysmal atrial fibrillation diagnosed 12/2017 previously on Eliquis however with an allergic reaction transition to Xarelto for anticoagulation (low-dose secondary to CKD).  She previously underwent DCCV cardioversion however this failed and she was started on flecainide after normal ETT 01/31/2018.  Echocardiogram from 12/2017 with an EF at 55 to 60% and no structural heart disease.  She ultimately underwent successful DCCV while on a AT 01/2018.  Post cardioversion, she wore a monitor which showed 99% NSR.  Also with a history of CKD stage III, HLD, HTN, hypothyroidism, myasthenia gravis and  diagnosis of breast cancer s/p left breast radioactive seed guided lumpectomy, sentinel lymph node biopsy and lymph node excision on 05/18/2021.with chemo and XRT done 08/2021   On low-dose Toprol as higher doses exacerbates her myasthenia gravis.    She has undergone chemotherapy and plans for radiation.   Some insomnia using melatonin Still anxious about prognosis from cancer Had some palpitations with XRT but no PAF   BP elevated on last visit and norvasc added 07/02/22 She has been having more pedal edema Her nephrologist stopped her diuretic last year due to Cr 1.6 range Seen by Dr Sarajane Jews and med list includes Maxzide and Lasix BMET 11/27/22 with Cr 1.39 BUN 26 and K 4.0   Discussed stopping Norvasc with worse peripheral edema in future if needed   Past Medical History:  Diagnosis Date   Adenomatous colon polyp    Anal fissure    Anxiety    Asthma    Dr. Halford Chessman   Atrial fibrillation [facility])    Bowel obstruction (HCC)    Bursitis    CKD (chronic kidney disease) stage 3, GFR 30-59 ml/min ([place])    sees Dr. [name]    Complication of anesthesia    post op N/V   Diverticulosis    Dyspnea    After a meal   Dysrhythmia    Afib   GERD (gastroesophageal reflux disease)    Glaucoma    Hiatal hernia    Giant type IV hiatal hernia; sees Dr. [name] at [facility]   Hyperlipidemia    Hypertension    Hypothyroidism    Myasthenia gravis ([place]) 2013   followed at [facility] Dr Scheryl Marten, diplopia only   Osteoarthritis    see's Dr. [name]   Osteoporosis    last DEXA 08-03-11    Past Surgical History:  Procedure Laterality Date   ABDOMINAL HYSTERECTOMY     APPENDECTOMY     BREAST LUMPECTOMY Bilateral     several, enign breast lumps removed   BREAST LUMPECTOMY WITH RADIOACTIVE SEED AND SENTINEL LYMPH NODE BIOPSY Left 05/18/2021   Procedure: LEFT BREAST LUMPECTOMY WITH RADIOACTIVE SEED AND LEFT AXILLARY SENTINEL LYMPH NODE BIOPSY;  Surgeon: [name], MD;  Location: [place];  Service: General;  Laterality: Left;   CARDIOVERSION N/A 12/31/2017   Procedure: CARDIOVERSION;  Surgeon: Josue Hector, MD;  Location: [place];  Service: Cardiovascular;  Laterality: N/A;   CARDIOVERSION N/A 02/11/2018   Procedure: CARDIOVERSION;  Surgeon: Josue Hector, MD;  Location: [place];  Service: Cardiovascular;  Laterality: N/A;   COLONOSCOPY  09/27/2010  per Dr. Sharlett Iles, benign polyp, no repeats needed    EYE SURGERY     HERNIA REPAIR  10/18/2010   Dr. Hassell Done attempted but could reduce this, the entire stomach is above the diaphragm   PORT-A-CATH REMOVAL N/A 05/18/2021   Procedure: REMOVAL PORT-A-CATH;  Surgeon: [name], MD;  Location: [place];  Service: General;  Laterality: N/A;   PORTACATH PLACEMENT Right 01/25/2021   Procedure: INSERTION PORT-A-CATH;  Surgeon: [name], MD;  [facility];  Service: General;  Laterality: Right;  START TIME OF 8:30 AM FOR 60 MINUTES ROOM 4 [place] IQ   RADIOACTIVE SEED GUIDED AXILLARY SENTINEL LYMPH NODE Left 05/18/2021    Procedure: RADIOACTIVE SEED GUIDED LEFT AXILLARY NODE EXCISION;  Surgeon: [name], MD;  Location: [place];  Service: General;  Laterality: Left;   THYROIDECTOMY, PARTIAL  1999   TONSILLECTOMY       Current Outpatient Medications  Medication Sig Dispense Refill   albuterol (VENTOLIN HFA) 108 (90 Base) MCG/ACT inhaler INHALE 2 PUFFS INTO THE LUNGS EVERY 4 HOURS AS NEEDED FOR WHEEZE OR FOR SHORTNESS OF BREATH 18 each 5   alendronate (FOSAMAX) 70 MG tablet Take 1 tablet (70 mg total) by mouth once a week. Take with a full glass of water on an empty stomach. 12 tablet 3   amLODipine (NORVASC) 5 MG tablet Take 1 tablet (5 mg total) by mouth daily. 90 tablet 3   atorvastatin (LIPITOR) 10 MG tablet TAKE 1 TABLET BY MOUTH EVERY DAY 90 tablet 0   flecainide (TAMBOCOR) 50 MG tablet TAKE 1 TABLET BY MOUTH TWICE A DAY 180 tablet 2   furosemide (LASIX) 20 MG tablet Take 1 tablet (20 mg total) by mouth daily as needed for edema. 30 tablet 3   latanoprost (XALATAN) 0.005 % ophthalmic solution Place 1 drop into both eyes at bedtime.     letrozole (FEMARA) 2.5 MG tablet TAKE 1 TABLET BY MOUTH EVERY DAY 90 tablet 3   levothyroxine (SYNTHROID) 100 MCG tablet TAKE 1 TABLET BY MOUTH EVERY DAY 90 tablet 3   metoprolol succinate (TOPROL-XL) 50 MG 24 hr tablet Take 50 mg by mouth daily. Take with or immediately following a meal.     Multiple Vitamin (MULTIVITAMIN WITH MINERALS) TABS tablet Take 1 tablet by mouth in the morning. Centrum for Women     omeprazole (PRILOSEC) 40 MG capsule TAKE 1 CAPSULE BY MOUTH EVERY DAY AS NEEDED FOR REFLUX 90 capsule 0   predniSONE (DELTASONE) 1 MG tablet Take 4 tablets (4 mg total) by mouth daily with breakfast.     timolol (TIMOPTIC) 0.5 % ophthalmic solution Place 1 drop into both eyes in the morning and at bedtime.     triamterene-hydrochlorothiazide (MAXZIDE-25) 37.5-25 MG tablet Take 1 tablet by mouth daily. 90 tablet 3   valACYclovir (VALTREX) 1000 MG tablet Take 1  tablet (1,000 mg total) by mouth 3 (three) times daily. 30 tablet 0   XARELTO 15 MG TABS tablet TAKE 1 TABLET (15 MG TOTAL) BY MOUTH DAILY WITH SUPPER 90 tablet 1   lidocaine (LIDODERM) 5 % Place 1 patch onto the skin daily. Remove & Discard patch within 12 hours or as directed by MD (Patient not taking: Reported on 12/12/2022) 30 patch 2   No current facility-administered medications for this visit.    Allergies:   Codeine, Eliquis [apixaban], Meperidine hcl, Propoxyphene hcl, and Tape    Social History:  The patient  reports that she has never smoked. She has never  used smokeless tobacco. She reports that she does not drink alcohol and does not use drugs.   Family History:  The patient's family history includes Breast cancer in her maternal grandmother; Cancer in her brother, father, and sister; Clotting disorder in her brother; Colon cancer (age of onset: 30) in her son; Diabetes in her father; Hyperlipidemia in her father; Hypertension in her father; Kidney cancer in her mother.    ROS:  Please see the history of present illness. Otherwise, review of systems are positive for none.   All other systems are reviewed and negative.    PHYSICAL EXAM: VS:  BP (!) 120/92   Pulse 75   Ht '5\' 3"'$  (1.6 m)   Wt 149 lb (67.6 kg)   SpO2 99%   BMI 26.39 kg/m  , BMI Body mass index is 26.39 kg/m.  Affect appropriate Healthy:  appears stated age 46: normal Neck supple with no adenopathy JVP normal no bruits no thyromegaly Lungs clear with no wheezing and good diaphragmatic motion Heart:  S1/S2 no murmur, no rub, gallop or click PMI normal Abdomen: benighn, BS positve, no tenderness, no AAA no bruit.  No HSM or HJR Distal pulses intact with no bruits No edema Neuro non-focal Skin warm and dry No muscular weakness    EKG:  SR rate 61 nonspecific ST changes 01/23/21   Recent Labs: 11/27/2022: ALT 15; BUN 26; Creatinine, Ser 1.39; Hemoglobin 15.3; Platelets 161.0; Potassium 4.0; Sodium  144; TSH 0.94    Lipid Panel    Component Value Date/Time   CHOL 200 11/27/2022 1026   TRIG 115.0 11/27/2022 1026   HDL 59.90 11/27/2022 1026   CHOLHDL 3 11/27/2022 1026   VLDL 23.0 11/27/2022 1026   LDLCALC 117 (H) 11/27/2022 1026   LDLCALC 91 08/23/2020 1144   LDLDIRECT 142.2 07/27/2013 1007      Wt Readings from Last 3 Encounters:  12/12/22 149 lb (67.6 kg)  11/27/22 150 lb 3.2 oz (68.1 kg)  10/01/22 153 lb 6 oz (69.6 kg)     ASSESSMENT AND PLAN:  1. PAF: -Maintaining NSR  -Continue Flecainide, Xarelto, and beta blocker  -No s/s of bleeding in stool or urine.  -CBC stable on recent labs -On reduced dose Xarelto due to renal dysfunction   2. CKD stage III: -Creatinine, 1.39   -Follows with nephrology  Dr Clover Mealy     3.  HLD:  -Last LDL, 91 -Continue atorvastatin   4. HTN: -continue current meds  - consider d/c norvasc if edema worse   5. Breast cancer: -F/U oncology chemo/XRT Anti estrogen Letrozole Stage 3A    Current medicines are reviewed at length with the patient today.  The patient does not have concerns regarding medicines.  The following changes have been made:  None  Labs/ tests ordered today include:    None  No orders of the defined types were placed in this encounter.    Disposition:   FU in 6 months   Signed, Jenkins Rouge, MD  12/12/2022 9:23 AM    [facility] [address], [place], [place]  [address] Phone: [phone]; Fax: [phone]

## 2022-12-12 ENCOUNTER — Ambulatory Visit: Payer: Medicare HMO | Attending: Cardiovascular Disease | Admitting: Cardiovascular Disease

## 2022-12-12 ENCOUNTER — Encounter: Payer: Self-pay | Admitting: Cardiovascular Disease

## 2022-12-12 VITALS — BP 120/92 | HR 75 | Ht 63.0 in | Wt 149.0 lb

## 2022-12-12 DIAGNOSIS — I10 Essential (primary) hypertension: Secondary | ICD-10-CM

## 2022-12-12 DIAGNOSIS — C50919 Malignant neoplasm of unspecified site of unspecified female breast: Secondary | ICD-10-CM | POA: Diagnosis not present

## 2022-12-12 DIAGNOSIS — E785 Hyperlipidemia, unspecified: Secondary | ICD-10-CM | POA: Diagnosis not present

## 2022-12-12 DIAGNOSIS — I480 Paroxysmal atrial fibrillation: Secondary | ICD-10-CM | POA: Diagnosis not present

## 2022-12-12 NOTE — Patient Instructions (Signed)
Medication Instructions:  Your physician recommends that you continue on your current medications as directed. Please refer to the Current Medication list given to you today.  *If you need a refill on your cardiac medications before your next appointment, please call your pharmacy*   Lab Work: None.  If you have labs (blood work) drawn today and your tests are completely normal, you will receive your results only by: [place] (if you have MyChart) OR A paper copy in the mail If you have any lab test that is abnormal or we need to change your treatment, we will call you to review the results.   Testing/Procedures: None.   Follow-Up: At [facility], you and your health needs are our priority.  As part of our continuing mission to provide you with exceptional heart care, we have created designated Provider Care Teams.  These Care Teams include your primary Cardiologist (physician) and Advanced Practice Providers (APPs -  Physician Assistants and Nurse Practitioners) who all work together to provide you with the care you need, when you need it.  We recommend signing up for the patient portal called "MyChart".  Sign up information is provided on this After Visit Summary.  MyChart is used to connect with patients for Virtual Visits (Telemedicine).  Patients are able to view lab/test results, encounter notes, upcoming appointments, etc.  Non-urgent messages can be sent to your provider as well.   To learn more about what you can do with MyChart, go to [email or web address].    Your next appointment:   6 month(s)  Provider:   Jenkins Rouge, MD

## 2022-12-31 ENCOUNTER — Ambulatory Visit: Payer: Medicare HMO | Attending: General Surgery

## 2022-12-31 VITALS — Wt 147.2 lb

## 2022-12-31 DIAGNOSIS — Z483 Aftercare following surgery for neoplasm: Secondary | ICD-10-CM | POA: Insufficient documentation

## 2022-12-31 NOTE — Therapy (Signed)
OUTPATIENT PHYSICAL THERAPY SOZO SCREENING NOTE   Patient Name: Christina Jacobs MRN: [id number] DOB:26-May-1941, 82 y.o., female Today's Date: 12/31/2022  PCP: Laurey Morale, MD REFERRING PROVIDER: Laurey Morale, MD   PT End of Session - 12/31/22 1607     Visit Number 8   # unchanged due to screen only   PT Start Time 1605    PT Stop Time 1609    PT Time Calculation (min) 4 min    Activity Tolerance Patient tolerated treatment well    Behavior During Therapy WFL for tasks assessed/performed             Past Medical History:  Diagnosis Date   Adenomatous colon polyp    Anal fissure    Anxiety    Asthma    Dr. Halford Chessman   Atrial fibrillation [facility])    Bowel obstruction ([place])    Bursitis    CKD (chronic kidney disease) stage 3, GFR 30-59 ml/min ([place])    sees Dr. [name]    Complication of anesthesia    post op N/V   Diverticulosis    Dyspnea    After a meal   Dysrhythmia    Afib   GERD (gastroesophageal reflux disease)    Glaucoma    Hiatal hernia    Giant type IV hiatal hernia; sees Dr. [name] at [facility]   Hyperlipidemia    Hypertension    Hypothyroidism    Myasthenia gravis ([place]) 2013   [facility] Dr Scheryl Marten, diplopia only   Osteoarthritis    see's Dr. [name]   Osteoporosis    last DEXA 08-03-11   Past Surgical History:  Procedure Laterality Date   ABDOMINAL HYSTERECTOMY     APPENDECTOMY     BREAST LUMPECTOMY Bilateral     several, enign breast lumps removed   BREAST LUMPECTOMY WITH RADIOACTIVE SEED AND SENTINEL LYMPH NODE BIOPSY Left 05/18/2021   Procedure: LEFT BREAST LUMPECTOMY WITH RADIOACTIVE SEED AND LEFT AXILLARY SENTINEL LYMPH NODE BIOPSY;  Surgeon: [name], MD;  Location: [place];  Service: General;  Laterality: Left;   CARDIOVERSION N/A 12/31/2017   Procedure: CARDIOVERSION;  Surgeon: Josue Hector, MD;  Location: [place];  Service: Cardiovascular;  Laterality: N/A;   CARDIOVERSION N/A 02/11/2018    Procedure: CARDIOVERSION;  Surgeon: Josue Hector, MD;  Location: [facility];  Service: Cardiovascular;  Laterality: N/A;   COLONOSCOPY  09/27/2010   per Dr. Sharlett Iles, benign polyp, no repeats needed    [place]  10/18/2010   Dr. Hassell Done attempted but could reduce this, the entire stomach is above the diaphragm   PORT-A-CATH REMOVAL N/A 05/18/2021   Procedure: REMOVAL PORT-A-CATH;  Surgeon: [name], MD;  Location: [place];  Service: General;  Laterality: N/A;   PORTACATH PLACEMENT Right 01/25/2021   Procedure: INSERTION PORT-A-CATH;  Surgeon: [name], MD;  [facility];  Service: General;  Laterality: Right;  START TIME OF 8:30 AM FOR 60 MINUTES ROOM 4 [place] IQ   RADIOACTIVE SEED GUIDED AXILLARY SENTINEL LYMPH NODE Left 05/18/2021   Procedure: RADIOACTIVE SEED GUIDED LEFT AXILLARY NODE EXCISION;  Surgeon: [name], MD;  Location: [place];  Service: General;  Laterality: Left;   THYROIDECTOMY, [place]     Patient Active Problem List   Diagnosis Date Noted   Port-A-Cath in place 01/26/2021   Malignant neoplasm of upper-outer quadrant of left breast in female, estrogen receptor  positive ([employer]) 01/02/2021   Paroxysmal atrial fibrillation ([place]) 08/19/2019   CKD (chronic kidney disease) stage 3, GFR 30-59 ml/min (HCC) 08/09/2017   Hiatal hernia 08/09/2017   Glaucoma 08/08/2016   Restrictive lung disease 09/29/2013   Myasthenia gravis ([place]) 07/14/2012   HERNIA 07/17/2010   DIVERTICULOSIS, COLON 07/14/2010   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 level reflux 07/14/2010   EXTERNAL HEMORRHOIDS 07/13/2009   GERD 02/16/2009   OA (osteoarthritis) 07/01/2008   MITRAL VALVE PROLAPSE 11/17/2007   Hypothyroidism 07/16/2007   Dyslipidemia 07/16/2007   Essential hypertension 07/16/2007   Osteoporosis 07/16/2007    REFERRING DIAG: left breast cancer at risk for lymphedema  THERAPY DIAG: Aftercare  following surgery for neoplasm  PERTINENT HISTORY: Patient was diagnosed on 12/06/2020 with left grade III invasive ductal carcinoma breast cancer. She underwent neoadjuvant chemotherapy from 01/26/2021-03/11/2021 followed by a left lumpectomy and sentinel node biopsy on 05/18/2021 with 1 of 2 lymph nodes being positive for cancer. It is ER positive, PR negative, and HER2 negative with a Ki67 of 20%. She has a hiatal hernia which is severe and limits her ability to exercise   PRECAUTIONS: left UE Lymphedema risk, None  SUBJECTIVE: Pt returns for her 3 month L-Dex screen .  PAIN:  Are you having pain? No.  SOZO SCREENING: Patient was assessed today using the SOZO machine to determine the lymphedema index score. This was compared to her baseline score. It was determined that she is within the recommended range when compared to her baseline and no further action is needed at this time. She will continue SOZO screenings. These are done every 3 months for 2 years post operatively followed by every 6 months for 2 years, and then annually.   L-DEX FLOWSHEETS - 12/31/22 1600       L-DEX LYMPHEDEMA SCREENING   Measurement Type Unilateral    L-DEX MEASUREMENT EXTREMITY Upper Extremity    POSITION  Standing    DOMINANT SIDE Right    At Risk Side Left    BASELINE SCORE (UNILATERAL) -1    L-DEX SCORE (UNILATERAL) -0.4    VALUE CHANGE (UNILAT) 0.6               Otelia Limes, PTA 12/31/2022, 4:09 PM

## 2023-02-01 ENCOUNTER — Other Ambulatory Visit: Payer: Self-pay | Admitting: Family Medicine

## 2023-02-07 ENCOUNTER — Encounter: Payer: Self-pay | Admitting: Hematology and Oncology

## 2023-02-11 ENCOUNTER — Encounter: Payer: Self-pay | Admitting: Family Medicine

## 2023-02-11 ENCOUNTER — Ambulatory Visit (INDEPENDENT_AMBULATORY_CARE_PROVIDER_SITE_OTHER): Payer: Medicare HMO | Admitting: Family Medicine

## 2023-02-11 VITALS — BP 120/80 | HR 62 | Temp 97.7°F | Wt 149.4 lb

## 2023-02-11 DIAGNOSIS — R413 Other amnesia: Secondary | ICD-10-CM

## 2023-02-11 MED ORDER — DONEPEZIL HCL 5 MG PO TABS: 5.0000 mg | ORAL_TABLET | Freq: Every day | ORAL | 0 refills | Status: DC

## 2023-02-11 NOTE — Progress Notes (Signed)
   Subjective:    Patient ID: Christina Jacobs, female    DOB: Feb 11, 1941, 82 y.o.   MRN: [id number]  HPI Here with her husband to discuss memory issues. She does not notice this much, but her husband says she will forget conversations the next day and she often has to search for a word when she is talking. No family hx of memory issues.   Review of Systems  Constitutional: Negative.   Respiratory: Negative.    Cardiovascular: Negative.   Neurological: Negative.        Objective:   Physical Exam Constitutional:      Appearance: Normal appearance.  Cardiovascular:     Rate and Rhythm: Normal rate and regular rhythm.     Pulses: Normal pulses.     Heart sounds: Normal heart sounds.  Pulmonary:     Effort: Pulmonary effort is normal.     Breath sounds: Normal breath sounds.  Neurological:     General: No focal deficit present.     Mental Status: She is alert and oriented to person, place, and time.           Assessment & Plan:  Memory loss. She will try Aricept 5 mg daily for 30 days. Recheck after that. Gershon Crane, MD

## 2023-02-12 ENCOUNTER — Other Ambulatory Visit: Payer: Self-pay | Admitting: Cardiovascular Disease

## 2023-02-12 DIAGNOSIS — I480 Paroxysmal atrial fibrillation: Secondary | ICD-10-CM

## 2023-02-12 NOTE — Telephone Encounter (Signed)
Xarelto 15mg  refill request received. Pt is 82 years old, weight-67.8kg, Crea-1.39 on 11/27/22, last seen by Dr. Eden Emms on 12/12/22, Diagnosis-Afib, CrCl-33.97 mL/min; Dose is appropriate based on dosing criteria. Will send in refill to requested pharmacy.

## 2023-02-24 ENCOUNTER — Encounter: Payer: Self-pay | Admitting: Family Medicine

## 2023-02-26 ENCOUNTER — Other Ambulatory Visit: Payer: Self-pay

## 2023-02-26 MED ORDER — MEMANTINE HCL 10 MG PO TABS
10.0000 mg | ORAL_TABLET | Freq: Two times a day (BID) | ORAL | 5 refills | Status: DC
Start: 2023-02-26 — End: 2023-06-13

## 2023-02-26 NOTE — Telephone Encounter (Signed)
Yes, I stopped the Donepizil. Instead she can try Namenda (this does not cause diarrhea)

## 2023-03-09 ENCOUNTER — Other Ambulatory Visit: Payer: Self-pay | Admitting: Cardiovascular Disease

## 2023-03-13 ENCOUNTER — Other Ambulatory Visit: Payer: Self-pay | Admitting: Hematology and Oncology

## 2023-04-14 ENCOUNTER — Other Ambulatory Visit: Payer: Self-pay | Admitting: Family Medicine

## 2023-04-21 ENCOUNTER — Other Ambulatory Visit: Payer: Self-pay | Admitting: Family Medicine

## 2023-05-15 NOTE — Progress Notes (Signed)
Patient Care Team: Nelwyn Salisbury, MD as PCP - General Wendall Stade, MD as PCP - Cardiology (Cardiology) Storm Frisk, MD (Pulmonary Disease) Emelia Loron, MD as Consulting Physician (General Surgery) Serena Croissant, MD as Consulting Physician (Hematology and Oncology) Dorothy Puffer, MD as Consulting Physician (Radiation Oncology) Evangeline Gula, MD as Consulting Physician (Neurology)  DIAGNOSIS: No diagnosis found.  SUMMARY OF ONCOLOGIC HISTORY: Oncology History  Malignant neoplasm of upper-outer quadrant of left breast in female, estrogen receptor positive (HCC)  01/02/2021 Initial Diagnosis   Patient experienced chronic left breast tenderness for 2 years with a negative mammogram last year. Screening mammogram showed a 2.5cm central left breast mass and one enlarged lymph node. US showed a 2.6cm mass at the 12 o'clock position and a 1.3cm mass in the left axillary tail. 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 Dwain Sarna: Residual IDC 2.2 cm, DCIS intermediate grade, margins negative, 1/2 lymph nodes positive ER 40% weak, PR negative, HER2 negative, Ki-67 20%   05/18/2021 Cancer Staging   Staging form: Breast, AJCC 8th Edition - Pathologic stage from 05/18/2021: No Stage Recommended (ypT2, pN1a, cM0, G2) - Signed by Loa Socks, NP on 10/31/2021 Stage prefix: Post-therapy Histologic grading system: 3 grade system    06/27/2021 - 08/15/2021 Radiation Therapy   Site Technique Total Dose (Gy) Dose per Fx (Gy) Completed Fx Beam Energies  Breast, Left: Breast_Lt 3D 50.4/50.4 1.8 28/28 6X, 10X  Breast, Left: Breast_Lt_SCLV 3D 50.4/50.4 1.8 28/28 6X, 10X  Breast, Left: Breast_Lt_Bst 3D 10/10 2 5/5 6X, 10X     08/19/2021 - 08/2028 Anti-estrogen oral therapy   Letrozole 1 mg daily     CHIEF COMPLIANT:   INTERVAL HISTORY: Christina Jacobs is a   ALLERGIES:  is allergic to codeine, eliquis [apixaban], meperidine hcl, propoxyphene hcl, and tape.  MEDICATIONS:  Current Outpatient Medications  Medication Sig Dispense Refill   albuterol (VENTOLIN HFA) 108 (90 Base) MCG/ACT inhaler INHALE 2 PUFFS INTO THE LUNGS EVERY 4 HOURS AS NEEDED FOR WHEEZE OR FOR SHORTNESS OF BREATH 18 each 5   alendronate (FOSAMAX) 70 MG tablet Take 1 tablet (70 mg total) by mouth once a week. Take with a full glass of water on an empty stomach. 12 tablet 3   amLODipine (NORVASC) 5 MG tablet Take 1 tablet (5 mg total) by mouth daily. 90 tablet 3   atorvastatin (LIPITOR) 10 MG tablet TAKE 1 TABLET BY MOUTH EVERY DAY 90 tablet 1   flecainide (TAMBOCOR) 50 MG tablet TAKE 1 TABLET BY MOUTH TWICE A DAY 180 tablet 3   furosemide (LASIX) 20 MG tablet Take 1 tablet (20 mg total) by mouth daily as needed for edema. 30 tablet 3   latanoprost (XALATAN) 0.005 % ophthalmic solution Place 1 drop into both eyes at bedtime.     letrozole (FEMARA) 2.5 MG tablet TAKE 1 TABLET BY MOUTH EVERY DAY 90 tablet 3  levothyroxine (SYNTHROID) 100 MCG tablet TAKE 1 TABLET BY MOUTH EVERY DAY 90 tablet 3   memantine (NAMENDA) 10 MG tablet Take 1 tablet (10 mg total) by mouth 2 (two) times daily. 60 tablet 5   metoprolol succinate (TOPROL-XL) 50 MG 24 hr tablet TAKE 2 TABLETS BY MOUTH IN THE MORNING AND TAKE 1 TABLET IN THE EVENING 270 tablet 3   Multiple Vitamin (MULTIVITAMIN WITH MINERALS) TABS tablet Take 1 tablet by mouth in the morning. Centrum for Women      omeprazole (PRILOSEC) 40 MG capsule TAKE 1 CAPSULE BY MOUTH EVERY DAY AS NEEDED FOR REFLUX 90 capsule 0   predniSONE (DELTASONE) 1 MG tablet Take 4 tablets (4 mg total) by mouth daily with breakfast.     timolol (TIMOPTIC) 0.5 % ophthalmic solution Place 1 drop into both eyes in the morning and at bedtime.     triamterene-hydrochlorothiazide (MAXZIDE-25) 37.5-25 MG tablet Take 1 tablet by mouth daily. 90 tablet 3   XARELTO 15 MG TABS tablet TAKE 1 TABLET (15 MG TOTAL) BY MOUTH DAILY WITH SUPPER 90 tablet 1   No current facility-administered medications for this visit.    PHYSICAL EXAMINATION: ECOG PERFORMANCE STATUS: {CHL ONC ECOG PS:[phone]}  There were no vitals filed for this visit. There were no vitals filed for this visit.  BREAST:*** No palpable masses or nodules in either right or left breasts. No palpable axillary supraclavicular or infraclavicular adenopathy no breast tenderness or nipple discharge. (exam performed in the presence of a chaperone)  LABORATORY DATA:  I have reviewed the data as listed    Latest Ref Rng & Units 11/27/2022   10:26 AM 02/02/2022   10:19 AM 11/20/2021   10:58 AM  CMP  Glucose 70 - 99 mg/dL 89   161   BUN 6 - 23 mg/dL 26   25   Creatinine 0.96 - 1.20 mg/dL 0.45  4.09  8.11   Sodium 135 - 145 mEq/L 144   140   Potassium 3.5 - 5.1 mEq/L 4.0   3.7   Chloride 96 - 112 mEq/L 104   104   CO2 19 - 32 mEq/L 31   28   Calcium 8.4 - 10.5 mg/dL 9.4   9.8   Total Protein 6.0 - 8.3 g/dL 6.8   7.3   Total Bilirubin 0.2 - 1.2 mg/dL 1.1   1.0   Alkaline Phos 39 - 117 U/L 48   62   AST 0 - 37 U/L 16   19   ALT 0 - 35 U/L 15   13     Lab Results  Component Value Date   WBC 6.3 11/27/2022   HGB 15.3 (H) 11/27/2022   HCT 45.9 11/27/2022   MCV 92.4 11/27/2022   PLT 161.0 11/27/2022   NEUTROABS 4.5 11/27/2022    ASSESSMENT & PLAN:  No problem-specific Assessment & Plan notes found for this encounter.    No orders of the defined types were placed in  this encounter.  The patient has a good understanding of the overall plan. she agrees with it. she will call with any problems that may develop before the next visit here. Total time spent: 30 mins including face to face time and time spent for planning, charting and co-ordination of care   Sherlyn Lick, CMA 05/15/23    I Janan Ridge am acting as a Neurosurgeon for [employer]  ***

## 2023-05-16 ENCOUNTER — Inpatient Hospital Stay: Payer: Medicare HMO | Attending: Hematology and Oncology | Admitting: Hematology and Oncology

## 2023-05-16 VITALS — BP 131/65 | HR 86 | Temp 97.7°F | Resp 18 | Ht 63.0 in | Wt 150.4 lb

## 2023-05-16 DIAGNOSIS — M810 Age-related osteoporosis without current pathological fracture: Secondary | ICD-10-CM | POA: Insufficient documentation

## 2023-05-16 DIAGNOSIS — C50412 Malignant neoplasm of upper-outer quadrant of left female breast: Secondary | ICD-10-CM | POA: Insufficient documentation

## 2023-05-16 DIAGNOSIS — Z923 Personal history of irradiation: Secondary | ICD-10-CM | POA: Diagnosis not present

## 2023-05-16 DIAGNOSIS — Z9221 Personal history of antineoplastic chemotherapy: Secondary | ICD-10-CM | POA: Insufficient documentation

## 2023-05-16 DIAGNOSIS — Z79899 Other long term (current) drug therapy: Secondary | ICD-10-CM | POA: Insufficient documentation

## 2023-05-16 DIAGNOSIS — Z780 Asymptomatic menopausal state: Secondary | ICD-10-CM

## 2023-05-16 DIAGNOSIS — Z79811 Long term (current) use of aromatase inhibitors: Secondary | ICD-10-CM | POA: Insufficient documentation

## 2023-05-16 DIAGNOSIS — Z170 Estrogen receptor positive status [ER+]: Secondary | ICD-10-CM | POA: Diagnosis not present

## 2023-05-16 NOTE — Assessment & Plan Note (Signed)
01/02/2021: Screening mammogram detected left breast mass at 12 o'clock position 2.5 cm spiculated mass.  Abnormal left axillary lymph node 1.3 cm.  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  Neoadjuvant chemotherapy with 4 cycles of Taxotere and Cytoxan completed 03/30/2021 2. 05/18/2021: Left lumpectomy with Dr. Dwain Sarna: Residual IDC 2.2 cm, DCIS intermediate grade, margins negative, 1/2 lymph nodes positive ER 40% weak, PR negative, HER2 negative, Ki-67 20% 3.  Adjuvant radiation completed 08/14/21 4.  Follow-up adjuvant antiestrogen therapy with letrozole 1 mg daily x7 years started 08/19/21 ------------------------------------------------------------------------------------------------------------  letrozole toxicities: denies any side effects   Osteoporosis: Bones T score -2.8: Osteoporosis: Taking calcium and vitamin D, and Fosamax. 02/03/2022: CT CAP: No evidence of metastatic disease.  Interval increase in the right ovarian cyst 4.9 cm Send a referral to Dr. Pricilla Holm who saw the patient with a plan to perform pelvic ultrasound in 3 to 6 months and a CA125.   Breast cancer surveillance: 1.  Mammogram 02/04/2023: [facility]: Benign, breast density category C 2. breast exam 05/16/2023: Benign   Return to clinic in 1 year for follow-up

## 2023-05-21 ENCOUNTER — Telehealth: Payer: Self-pay | Admitting: Hematology and Oncology

## 2023-05-21 NOTE — Telephone Encounter (Signed)
 Scheduled appointment per 7/11 los. Patient is aware of the made appointment.

## 2023-06-05 ENCOUNTER — Ambulatory Visit (HOSPITAL_BASED_OUTPATIENT_CLINIC_OR_DEPARTMENT_OTHER)
Admission: RE | Admit: 2023-06-05 | Discharge: 2023-06-05 | Disposition: A | Discharge status: Home / Self Care | Payer: Medicare HMO | Source: Ambulatory Visit | Attending: Hematology and Oncology | Admitting: Hematology and Oncology

## 2023-06-05 DIAGNOSIS — Z170 Estrogen receptor positive status [ER+]: Secondary | ICD-10-CM | POA: Insufficient documentation

## 2023-06-05 DIAGNOSIS — C50412 Malignant neoplasm of upper-outer quadrant of left female breast: Secondary | ICD-10-CM | POA: Insufficient documentation

## 2023-06-05 DIAGNOSIS — Z780 Asymptomatic menopausal state: Secondary | ICD-10-CM | POA: Diagnosis present

## 2023-06-10 NOTE — Progress Notes (Unsigned)
Cardiology Office Note   Date:  06/13/2023   ID:  Christina Jacobs, DOB 1941-03-09, MRN [id number]  PCP:  Nelwyn Salisbury, MD  Cardiologist:  Dr. Eden Emms, MD  No chief complaint on file.   History of Present Illness: Christina Jacobs is a 82 y.o. female who presents for follow-up of paroxysmal atrial fibrillation diagnosed 12/2017 previously on Eliquis however with an allergic reaction transition to Xarelto for anticoagulation (low-dose secondary to CKD).  She previously underwent DCCV cardioversion however this failed and she was started on flecainide after normal ETT 01/31/2018.  Echocardiogram from 12/2017 with an EF at 55 to 60% and no structural heart disease.  She ultimately underwent successful DCCV while on a AT 01/2018.  Post cardioversion, she wore a monitor which showed 99% NSR.  Also with a history of CKD stage III, HLD, HTN, hypothyroidism, myasthenia gravis and  diagnosis of breast cancer s/p left breast radioactive seed guided lumpectomy, sentinel lymph node biopsy and lymph node excision on 05/18/2021.with chemo and XRT done 08/2021   On low-dose Toprol as higher doses exacerbates her myasthenia gravis.    She sees [place] for breast cancer left lumpectomy 2022 with chemo and XRT completed 08/14/21  adjuvant Letrozole antiestrogen Rx  Some insomnia using melatonin Still anxious about prognosis from cancer Had some palpitations with XRT but no PAF   BP elevated on last visit and norvasc added 07/02/22 She has been having more pedal edema Her nephrologist stopped her diuretic last year due to Cr 1.6 range Seen by Dr Clent Ridges and med list includes Maxzide and Lasix BMET 11/27/22 with Cr 1.39 BUN 26 and K 4.0   Discussed stopping Norvasc with worse peripheral edema in future if needed  Started on Aricept for memory loss by Dr Clent Ridges  She was concerned about Prolia and I told her lots of my patients were in it and tolerated well   Past Medical History:  Diagnosis Date   Adenomatous  colon polyp    Anal fissure    Anxiety    Asthma    Dr. Craige Cotta   Atrial fibrillation [facility])    Bowel obstruction (HCC)    Bursitis    CKD (chronic kidney disease) stage 3, GFR 30-59 ml/min (HCC)    sees Dr. Dayle Points    Complication of anesthesia    post op N/V   Diverticulosis    Dyspnea    After a meal   Dysrhythmia    Afib   GERD (gastroesophageal reflux disease)    Glaucoma    Hiatal hernia    Giant type IV hiatal hernia; sees Dr. Ranjan Iraq at [facility]   Hyperlipidemia    Hypertension    Hypothyroidism    Myasthenia gravis (HCC) 2013   followed at [facility] Dr Jeremy Johann, diplopia only   Osteoarthritis    see's Dr. Charlett Blake   Osteoporosis    last DEXA 08-03-11    Past Surgical History:  Procedure Laterality Date   ABDOMINAL HYSTERECTOMY     APPENDECTOMY     BREAST LUMPECTOMY Bilateral     several, enign breast lumps removed   BREAST LUMPECTOMY WITH RADIOACTIVE SEED AND SENTINEL LYMPH NODE BIOPSY Left 05/18/2021   Procedure: LEFT BREAST LUMPECTOMY WITH RADIOACTIVE SEED AND LEFT AXILLARY SENTINEL LYMPH NODE BIOPSY;  Surgeon: Emelia Loron, MD;  Location[facility];  Service: General;  Laterality: Left;   CARDIOVERSION N/A 12/31/2017   Procedure: CARDIOVERSION;  Surgeon: Wendall Stade, MD;  Location: [facility] ENDOSCOPY;  Service: Cardiovascular;  Laterality: N/A;   CARDIOVERSION N/A 02/11/2018   Procedure: CARDIOVERSION;  Surgeon: Wendall Stade, MD;  Location: [facility] ENDOSCOPY;  Service: Cardiovascular;  Laterality: N/A;   COLONOSCOPY  09/27/2010   per Dr. Jarold Motto, benign polyp, no repeats needed    EYE SURGERY     HERNIA REPAIR  10/18/2010   Dr. Daphine Deutscher attempted but could reduce this, the entire stomach is above the diaphragm   PORT-A-CATH REMOVAL N/A 05/18/2021   Procedure: REMOVAL PORT-A-CATH;  Surgeon: Emelia Loron, MD;  Location: [facility] OR;  Service: General;  Laterality: N/A;   PORTACATH PLACEMENT Right 01/25/2021   Procedure: INSERTION PORT-A-CATH;   Surgeon: Emelia Loron, MD;  Location: [facility] ORS;  Service: General;  Laterality: Right;  START TIME OF 8:30 AM FOR 60 MINUTES ROOM 4 [place] [facility]   RADIOACTIVE SEED GUIDED AXILLARY SENTINEL LYMPH NODE Left 05/18/2021   Procedure: RADIOACTIVE SEED GUIDED LEFT AXILLARY NODE EXCISION;  Surgeon: Emelia Loron, MD;  Location: [facility] OR;  Service: General;  Laterality: Left;   THYROIDECTOMY, PARTIAL  1999   TONSILLECTOMY       Current Outpatient Medications  Medication Sig Dispense Refill   albuterol (VENTOLIN HFA) 108 (90 Base) MCG/ACT inhaler INHALE 2 PUFFS INTO THE LUNGS EVERY 4 HOURS AS NEEDED FOR WHEEZE OR FOR SHORTNESS OF BREATH 18 each 5   amLODipine (NORVASC) 5 MG tablet Take 1 tablet (5 mg total) by mouth daily. 90 tablet 3   atorvastatin (LIPITOR) 10 MG tablet TAKE 1 TABLET BY MOUTH EVERY DAY 90 tablet 1   flecainide (TAMBOCOR) 50 MG tablet TAKE 1 TABLET BY MOUTH TWICE A DAY 180 tablet 3   furosemide (LASIX) 20 MG tablet Take 1 tablet (20 mg total) by mouth daily as needed for edema. 30 tablet 3   latanoprost (XALATAN) 0.005 % ophthalmic solution Place 1 drop into both eyes at bedtime.     letrozole (FEMARA) 2.5 MG tablet TAKE 1 TABLET BY MOUTH EVERY DAY 90 tablet 3   levothyroxine (SYNTHROID) 100 MCG tablet TAKE 1 TABLET BY MOUTH EVERY DAY 90 tablet 3   memantine (NAMENDA) 10 MG tablet Take 1 tablet (10 mg total) by mouth 2 (two) times daily. 60 tablet 5   metoprolol succinate (TOPROL-XL) 50 MG 24 hr tablet TAKE 2 TABLETS BY MOUTH IN THE MORNING AND TAKE 1 TABLET IN THE EVENING 270 tablet 3   Multiple Vitamin (MULTIVITAMIN WITH MINERALS) TABS tablet Take 1 tablet by mouth in the morning. Centrum for Women     omeprazole (PRILOSEC) 40 MG capsule TAKE 1 CAPSULE BY MOUTH EVERY DAY AS NEEDED FOR REFLUX 90 capsule 0   predniSONE (DELTASONE) 1 MG tablet Take 4 tablets (4 mg total) by mouth daily with breakfast.     timolol (TIMOPTIC) 0.5 % ophthalmic solution Place 1 drop into both eyes in  the morning and at bedtime.     triamterene-hydrochlorothiazide (MAXZIDE-25) 37.5-25 MG tablet Take 1 tablet by mouth daily. 90 tablet 3   XARELTO 15 MG TABS tablet TAKE 1 TABLET (15 MG TOTAL) BY MOUTH DAILY WITH SUPPER 90 tablet 1   No current facility-administered medications for this visit.    Allergies:   Codeine, Eliquis [apixaban], Meperidine hcl, Propoxyphene hcl, and Tape    Social History:  The patient  reports that she has never smoked. She has never used smokeless tobacco. She reports that she does not drink alcohol and does not use drugs.   Family History:  The  patient's family history includes Breast cancer in her maternal grandmother; Cancer in her brother, father, and sister; Clotting disorder in her brother; Colon cancer (age of onset: 34) in her son; Diabetes in her father; Hyperlipidemia in her father; Hypertension in her father; Kidney cancer in her mother.    ROS:  Please see the history of present illness. Otherwise, review of systems are positive for none.   All other systems are reviewed and negative.    PHYSICAL EXAM: VS:  BP 120/70   Pulse 78   Ht 5\' 3"  (1.6 m)   Wt 151 lb (68.5 kg)   SpO2 96%   BMI 26.75 kg/m  , BMI Body mass index is 26.75 kg/m.  Affect appropriate Healthy:  appears stated age HEENT: normal Neck supple with no adenopathy JVP normal no bruits no thyromegaly Lungs clear with no wheezing and good diaphragmatic motion Heart:  S1/S2 no murmur, no rub, gallop or click PMI normal Abdomen: benighn, BS positve, no tenderness, no AAA no bruit.  No HSM or HJR Distal pulses intact with no bruits No edema Neuro non-focal Skin warm and dry No muscular weakness    EKG:  SR rate 61 nonspecific ST changes 01/23/21   Recent Labs: 11/27/2022: ALT 15; BUN 26; Creatinine, Ser 1.39; Hemoglobin 15.3; Platelets 161.0; Potassium 4.0; Sodium 144; TSH 0.94    Lipid Panel    Component Value Date/Time   CHOL 200 11/27/2022 1026   TRIG 115.0  11/27/2022 1026   HDL 59.90 11/27/2022 1026   CHOLHDL 3 11/27/2022 1026   VLDL 23.0 11/27/2022 1026   LDLCALC 117 (H) 11/27/2022 1026   LDLCALC 91 08/23/2020 1144   LDLDIRECT 142.2 07/27/2013 1007      Wt Readings from Last 3 Encounters:  06/13/23 151 lb (68.5 kg)  05/16/23 150 lb 6.4 oz (68.2 kg)  02/11/23 149 lb 6.4 oz (67.8 kg)     ASSESSMENT AND PLAN:  1. PAF: -Maintaining NSR  -Continue Flecainide, Xarelto, and beta blocker  -No s/s of bleeding in stool or urine.  -CBC stable on recent labs -On reduced dose Xarelto due to renal dysfunction   2. CKD stage III: -Creatinine, 1.39   -Follows with nephrology  Dr Lacy Duverney     3.  HLD:  -Last LDL, 91 -Continue atorvastatin   4. HTN: -continue current meds  - consider d/c norvasc if edema worse   5. Breast cancer: -F/U oncology chemo/XRT Anti estrogen Letrozole Stage 3A   6. Memory Loss:  subtle on Aricept no excessive bradycardia    Current medicines are reviewed at length with the patient today.  The patient does not have concerns regarding medicines.  The following changes have been made:  None  Labs/ tests ordered today include:    None  Orders Placed This Encounter  Procedures   EKG 12-Lead     Disposition:   FU in 6 months   Signed, Charlton Haws, MD  06/13/2023 2:04 PM    [facility] [address] [place], [place], [place]  [address] Phone: [phone]; Fax: [phone]

## 2023-06-10 NOTE — Progress Notes (Signed)
HEMATOLOGY-ONCOLOGY TELEPHONE VISIT PROGRESS NOTE  I connected with our patient on 06/11/23 at  8:00 AM EDT by telephone and verified that I am speaking with the correct person using two identifiers.  I discussed the limitations, risks, security and privacy concerns of performing an evaluation and management service by telephone and the availability of in person appointments.  I also discussed with the patient that there may be a patient responsible charge related to this service. The patient expressed understanding and agreed to proceed.   History of Present Illness: Christina Jacobs is a 82 y.o. with above-mentioned history of breast cancer having undergone chemotherapy. Currently on letrozole. She presents to the clinic today for a telephone follow-up to review bone density.   Oncology History  Malignant neoplasm of upper-outer quadrant of left breast in female, estrogen receptor positive (HCC)  01/02/2021 Initial Diagnosis   Patient experienced chronic left breast tenderness for 2 years with a negative mammogram last year. Screening mammogram showed a 2.5cm central left breast mass and one enlarged lymph node. US showed a 2.6cm mass at the 12 o'clock position and a 1.3cm mass in the left axillary tail. 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 Dwain Sarna: Residual IDC 2.2 cm, DCIS intermediate grade, margins negative, 1/2 lymph nodes positive ER 40%  weak, PR negative, HER2 negative, Ki-67 20%   05/18/2021 Cancer Staging   Staging form: Breast, AJCC 8th Edition - Pathologic stage from 05/18/2021: No Stage Recommended (ypT2, pN1a, cM0, G2) - Signed by Loa Socks, NP on 10/31/2021 Stage prefix: Post-therapy Histologic grading system: 3 grade system   06/27/2021 - 08/15/2021 Radiation Therapy   Site Technique Total Dose (Gy) Dose per Fx (Gy) Completed Fx Beam Energies  Breast, Left: Breast_Lt 3D 50.4/50.4 1.8 28/28 6X, 10X  Breast, Left: Breast_Lt_SCLV 3D 50.4/50.4 1.8 28/28 6X, 10X  Breast, Left: Breast_Lt_Bst 3D 10/10 2 5/5 6X, 10X     08/19/2021 - 08/2028 Anti-estrogen oral therapy   Letrozole 1 mg daily     REVIEW OF SYSTEMS:   Constitutional: Denies fevers, chills or abnormal weight loss All other systems were reviewed with the patient and are negative. Observations/Objective:     Assessment Plan:  Malignant neoplasm of upper-outer quadrant of left breast in female, estrogen receptor positive (HCC) 01/02/2021: Screening mammogram detected left breast mass at 12 o'clock position 2.5 cm spiculated mass.  Abnormal left axillary lymph node 1.3 cm.  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  Neoadjuvant chemotherapy with 4 cycles of Taxotere and Cytoxan completed 03/30/2021 2. 05/18/2021: Left lumpectomy with Dr. Dwain Sarna: Residual IDC 2.2 cm, DCIS intermediate grade, margins negative, 1/2 lymph nodes positive ER 40% weak, PR negative, HER2 negative, Ki-67 20% 3.  Adjuvant  radiation completed 08/14/21 4.  Follow-up adjuvant antiestrogen therapy with letrozole 1 mg daily x7 years started 08/19/21 ------------------------------------------------------------------------------------------------------------  letrozole toxicities: denies any  side effects   Osteoporosis: Bones T score -2.8: Osteoporosis: Taking calcium and vitamin D.  Patient stopped taking Fosamax because of adverse effects  Bone density 06/05/2023: T-score -3.7: Worsening osteoporosis: Recommended Prolia every 6 months  02/03/2022: CT CAP: No evidence of metastatic disease.  Interval increase in the right ovarian cyst 4.9 cm sees Dr. Pricilla Holm  Breast cancer surveillance: 1.  Mammogram 02/04/2023: [facility]: Benign, breast density category C 2. breast exam 05/16/2023: Benign We discussed the pros and cons of obtaining Signatera blood testing and decided not to pursue it.  Patient does not have any dental issues.  She sees her dentist every 6 months.  Okay to proceed without a formal dental clearance certificate for Prolia.  Return to clinic in 1 year for follow-up    I discussed the assessment and treatment plan with the patient. The patient was provided an opportunity to ask questions and all were answered. The patient agreed with the plan and demonstrated an understanding of the instructions. The patient was advised to call back or seek an in-person evaluation if the symptoms worsen or if the condition fails to improve as anticipated.   I provided 12 minutes of non-face-to-face time during this encounter.  This includes time for charting and coordination of care   Tamsen Meek, MD  I Janan Ridge am acting as a scribe for Dr.   I have reviewed the above documentation for accuracy and completeness, and I agree with the above.

## 2023-06-11 ENCOUNTER — Inpatient Hospital Stay: Payer: Medicare HMO | Attending: Hematology and Oncology | Admitting: Hematology and Oncology

## 2023-06-11 ENCOUNTER — Telehealth: Payer: Self-pay | Admitting: Hematology and Oncology

## 2023-06-11 DIAGNOSIS — Z170 Estrogen receptor positive status [ER+]: Secondary | ICD-10-CM | POA: Diagnosis not present

## 2023-06-11 DIAGNOSIS — C50412 Malignant neoplasm of upper-outer quadrant of left female breast: Secondary | ICD-10-CM | POA: Diagnosis not present

## 2023-06-11 DIAGNOSIS — M810 Age-related osteoporosis without current pathological fracture: Secondary | ICD-10-CM | POA: Insufficient documentation

## 2023-06-11 NOTE — Telephone Encounter (Signed)
Patient is aware of all scheduled appointment times/dates; patient also has callback number if needed for reschedules

## 2023-06-11 NOTE — Assessment & Plan Note (Signed)
01/02/2021: Screening mammogram detected left breast mass at 12 o'clock position 2.5 cm spiculated mass.  Abnormal left axillary lymph node 1.3 cm.  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  Neoadjuvant chemotherapy with 4 cycles of Taxotere and Cytoxan completed 03/30/2021 2. 05/18/2021: Left lumpectomy with Dr. Dwain Sarna: Residual IDC 2.2 cm, DCIS intermediate grade, margins negative, 1/2 lymph nodes positive ER 40% weak, PR negative, HER2 negative, Ki-67 20% 3.  Adjuvant radiation completed 08/14/21 4.  Follow-up adjuvant antiestrogen therapy with letrozole 1 mg daily x7 years started 08/19/21 ------------------------------------------------------------------------------------------------------------  letrozole toxicities: denies any side effects   Osteoporosis: Bones T score -2.8: Osteoporosis: Taking calcium and vitamin D.  Patient stopped taking Fosamax because of adverse effects  Bone density 06/05/2023: T-score -3.7: Worsening osteoporosis: Recommended Prolia every 6 months  02/03/2022: CT CAP: No evidence of metastatic disease.  Interval increase in the right ovarian cyst 4.9 cm sees Dr. Pricilla Holm  Breast cancer surveillance: 1.  Mammogram 02/04/2023: [facility]: Benign, breast density category C 2. breast exam 05/16/2023: Benign We discussed the pros and cons of obtaining Signatera blood testing and decided not to pursue it. Return to clinic in 1 year for follow-up

## 2023-06-13 ENCOUNTER — Ambulatory Visit: Payer: Medicare HMO | Attending: Cardiovascular Disease | Admitting: Cardiovascular Disease

## 2023-06-13 ENCOUNTER — Encounter: Payer: Self-pay | Admitting: Cardiovascular Disease

## 2023-06-13 VITALS — BP 120/70 | HR 78 | Ht 63.0 in | Wt 151.0 lb

## 2023-06-13 DIAGNOSIS — E785 Hyperlipidemia, unspecified: Secondary | ICD-10-CM | POA: Diagnosis not present

## 2023-06-13 DIAGNOSIS — I480 Paroxysmal atrial fibrillation: Secondary | ICD-10-CM | POA: Diagnosis not present

## 2023-06-13 DIAGNOSIS — I10 Essential (primary) hypertension: Secondary | ICD-10-CM

## 2023-06-13 NOTE — Patient Instructions (Signed)
Medication Instructions:  Your physician recommends that you continue on your current medications as directed. Please refer to the Current Medication list given to you today.  *If you need a refill on your cardiac medications before your next appointment, please call your pharmacy*  Lab Work: If you have labs (blood work) drawn today and your tests are completely normal, you will receive your results only by: MyChart Message (if you have MyChart) OR A paper copy in the mail If you have any lab test that is abnormal or we need to change your treatment, we will call you to review the results.  Testing/Procedures: None ordered today.  Follow-Up: At [facility], you and your health needs are our priority.  As part of our continuing mission to provide you with exceptional heart care, we have created designated Provider Care Teams.  These Care Teams include your primary Cardiologist (physician) and Advanced Practice Providers (APPs -  Physician Assistants and Nurse Practitioners) who all work together to provide you with the care you need, when you need it.  We recommend signing up for the patient portal called "MyChart".  Sign up information is provided on this After Visit Summary.  MyChart is used to connect with patients for Virtual Visits (Telemedicine).  Patients are able to view lab/test results, encounter notes, upcoming appointments, etc.  Non-urgent messages can be sent to your provider as well.   To learn more about what you can do with MyChart, go to [email or web address].    Your next appointment:   6 month(s)  Provider:   Peter Nishan, MD      

## 2023-06-18 ENCOUNTER — Other Ambulatory Visit: Payer: Self-pay

## 2023-06-18 DIAGNOSIS — Z170 Estrogen receptor positive status [ER+]: Secondary | ICD-10-CM

## 2023-06-19 ENCOUNTER — Other Ambulatory Visit: Payer: Self-pay

## 2023-06-19 ENCOUNTER — Inpatient Hospital Stay: Payer: Medicare HMO

## 2023-06-19 ENCOUNTER — Ambulatory Visit: Payer: Medicare HMO | Admitting: Cardiovascular Disease

## 2023-06-19 VITALS — BP 145/98 | HR 76 | Temp 98.0°F | Resp 16

## 2023-06-19 DIAGNOSIS — M810 Age-related osteoporosis without current pathological fracture: Secondary | ICD-10-CM | POA: Diagnosis present

## 2023-06-19 DIAGNOSIS — C50412 Malignant neoplasm of upper-outer quadrant of left female breast: Secondary | ICD-10-CM

## 2023-06-19 DIAGNOSIS — Z170 Estrogen receptor positive status [ER+]: Secondary | ICD-10-CM | POA: Diagnosis not present

## 2023-06-19 LAB — CBC WITH DIFFERENTIAL (CANCER CENTER ONLY)
Abs Immature Granulocytes: 0.03 10*3/uL (ref 0.00–0.07)
Basophils Absolute: 0 10*3/uL (ref 0.0–0.1)
Basophils Relative: 0 %
Eosinophils Absolute: 0.1 10*3/uL (ref 0.0–0.5)
Eosinophils Relative: 1 %
HCT: 42.8 % (ref 36.0–46.0)
Hemoglobin: 14.5 g/dL (ref 12.0–15.0)
Immature Granulocytes: 0 %
Lymphocytes Relative: 18 %
Lymphs Abs: 1.3 10*3/uL (ref 0.7–4.0)
MCH: 31.8 pg (ref 26.0–34.0)
MCHC: 33.9 g/dL (ref 30.0–36.0)
MCV: 93.9 fL (ref 80.0–100.0)
Monocytes Absolute: 0.5 10*3/uL (ref 0.1–1.0)
Monocytes Relative: 7 %
Neutro Abs: 5.5 10*3/uL (ref 1.7–7.7)
Neutrophils Relative %: 74 %
Platelet Count: 136 10*3/uL — ABNORMAL LOW (ref 150–400)
RBC: 4.56 MIL/uL (ref 3.87–5.11)
RDW: 14.4 % (ref 11.5–15.5)
WBC Count: 7.4 10*3/uL (ref 4.0–10.5)
nRBC: 0 % (ref 0.0–0.2)

## 2023-06-19 LAB — CMP (CANCER CENTER ONLY)
ALT: 18 U/L (ref 0–44)
AST: 17 U/L (ref 15–41)
Albumin: 3.7 g/dL (ref 3.5–5.0)
Alkaline Phosphatase: 48 U/L (ref 38–126)
Anion gap: 5 (ref 5–15)
BUN: 31 mg/dL — ABNORMAL HIGH (ref 8–23)
CO2: 30 mmol/L (ref 22–32)
Calcium: 9.5 mg/dL (ref 8.9–10.3)
Chloride: 107 mmol/L (ref 98–111)
Creatinine: 1.57 mg/dL — ABNORMAL HIGH (ref 0.44–1.00)
GFR, Estimated: 33 mL/min — ABNORMAL LOW (ref >60)
Glucose, Bld: 117 mg/dL — ABNORMAL HIGH (ref 70–99)
Potassium: 4.3 mmol/L (ref 3.5–5.1)
Sodium: 142 mmol/L (ref 135–145)
Total Bilirubin: 0.9 mg/dL (ref 0.3–1.2)
Total Protein: 6.7 g/dL (ref 6.5–8.1)

## 2023-06-19 MED ORDER — DENOSUMAB 60 MG/ML ~~LOC~~ SOSY
60.0000 mg | PREFILLED_SYRINGE | Freq: Once | SUBCUTANEOUS | Status: AC
  Administered 2023-06-19: 60 mg via SUBCUTANEOUS
  Filled 2023-06-19: qty 1

## 2023-06-19 NOTE — Patient Instructions (Signed)
Denosumab Injection (Osteoporosis) What is this medication? DENOSUMAB (den oh SUE mab) prevents and treats osteoporosis. It works by making your bones stronger and less likely to break (fracture). It is a monoclonal antibody. This medicine may be used for other purposes; ask your health care provider or pharmacist if you have questions. COMMON BRAND NAME(S): Prolia What should I tell my care team before I take this medication? They need to know if you have any of these conditions: Dental or gum disease Had thyroid or parathyroid (glands located in neck) surgery Having dental surgery or a tooth pulled Kidney disease Low levels of calcium in the blood On dialysis Poor nutrition Thyroid disease Trouble absorbing nutrients from your food An unusual or allergic reaction to denosumab, other medications, foods, dyes, or preservatives Pregnant or trying to get pregnant Breastfeeding How should I use this medication? This medication is injected under the skin. It is given by your care team in a hospital or clinic setting. A special MedGuide will be given to you before each treatment. Be sure to read this information carefully each time. Talk to your care team about the use of this medication in children. Special care may be needed. Overdosage: If you think you have taken too much of this medicine contact a poison control center or emergency room at once. NOTE: This medicine is only for you. Do not share this medicine with others. What if I miss a dose? Keep appointments for follow-up doses. It is important not to miss your dose. Call your care team if you are unable to keep an appointment. What may interact with this medication? Do not take this medication with any of the following: Other medications that contain denosumab This medication may also interact with the following: Medications that lower your chance of fighting infection Steroid medications, such as prednisone or cortisone This  list may not describe all possible interactions. Give your health care provider a list of all the medicines, herbs, non-prescription drugs, or dietary supplements you use. Also tell them if you smoke, drink alcohol, or use illegal drugs. Some items may interact with your medicine. What should I watch for while using this medication? Your condition will be monitored carefully while you are receiving this medication. You may need blood work done while taking this medication. This medication may increase your risk of getting an infection. Call your care team for advice if you get a fever, chills, sore throat, or other symptoms of a cold or flu. Do not treat yourself. Try to avoid being around people who are sick. Tell your dentist and dental surgeon that you are taking this medication. You should not have major dental surgery while on this medication. See your dentist to have a dental exam and fix any dental problems before starting this medication. Take good care of your teeth while on this medication. Make sure you see your dentist for regular follow-up appointments. This medication may cause low levels of calcium in your body. The risk of severe side effects is increased in people with kidney disease. Your care team may prescribe calcium and vitamin D to help prevent low calcium levels while you take this medication. It is important to take calcium and vitamin D as directed by your care team. Talk to your care team if you may be pregnant. Serious birth defects may occur if you take this medication during pregnancy and for 5 months after the last dose. You will need a negative pregnancy test before starting this medication. Contraception   is recommended while taking this medication and for 5 months after the last dose. Your care team can help you find the option that works for you. Talk to your care team before breastfeeding. Changes to your treatment plan may be needed. What side effects may I notice from  receiving this medication? Side effects that you should report to your care team as soon as possible: Allergic reactions--skin rash, itching, hives, swelling of the face, lips, tongue, or throat Infection--fever, chills, cough, sore throat, wounds that don't heal, pain or trouble when passing urine, general feeling of discomfort or being unwell Low calcium level--muscle pain or cramps, confusion, tingling, or numbness in the hands or feet Osteonecrosis of the jaw--pain, swelling, or redness in the mouth, numbness of the jaw, poor healing after dental work, unusual discharge from the mouth, visible bones in the mouth Severe bone, joint, or muscle pain Skin infection--skin redness, swelling, warmth, or pain Side effects that usually do not require medical attention (report these to your care team if they continue or are bothersome): Back pain Headache Joint pain Muscle pain Pain in the hands, arms, legs, or feet Runny or stuffy nose Sore throat This list may not describe all possible side effects. Call your doctor for medical advice about side effects. You may report side effects to FDA at 1-800-FDA-1088. Where should I keep my medication? This medication is given in a hospital or clinic. It will not be stored at home. NOTE: This sheet is a summary. It may not cover all possible information. If you have questions about this medicine, talk to your doctor, pharmacist, or health care provider.  2024 Elsevier/Gold Standard (2022-11-27 00:00:00)  

## 2023-06-20 ENCOUNTER — Other Ambulatory Visit: Payer: Self-pay | Admitting: Cardiovascular Disease

## 2023-06-24 ENCOUNTER — Ambulatory Visit: Payer: Medicare HMO

## 2023-07-01 ENCOUNTER — Ambulatory Visit: Payer: Medicare HMO | Attending: General Surgery

## 2023-07-01 VITALS — Wt 154.0 lb

## 2023-07-01 DIAGNOSIS — Z483 Aftercare following surgery for neoplasm: Secondary | ICD-10-CM | POA: Insufficient documentation

## 2023-07-01 NOTE — Therapy (Signed)
OUTPATIENT PHYSICAL THERAPY SOZO SCREENING NOTE   Patient Name: Christina Jacobs MRN: [id number] DOB:Jun 29, 1941, 82 y.o., female Today's Date: 07/01/2023  PCP: Nelwyn Salisbury, MD REFERRING PROVIDER: Emelia Loron, MD   PT End of Session - 07/01/23 0919     Visit Number 8   # unchanged due to screen only   PT Start Time 0917    PT Stop Time 0922    PT Time Calculation (min) 5 min    Activity Tolerance Patient tolerated treatment well    Behavior During Therapy WFL for tasks assessed/performed             Past Medical History:  Diagnosis Date   Adenomatous colon polyp    Anal fissure    Anxiety    Asthma    Dr. Craige Cotta   Atrial fibrillation [facility])    Bowel obstruction (HCC)    Bursitis    CKD (chronic kidney disease) stage 3, GFR 30-59 ml/min (HCC)    sees Dr. Dayle Points    Complication of anesthesia    post op N/V   Diverticulosis    Dyspnea    After a meal   Dysrhythmia    Afib   GERD (gastroesophageal reflux disease)    Glaucoma    Hiatal hernia    Giant type IV hiatal hernia; sees Dr. Ranjan Iraq at [facility]   Hyperlipidemia    Hypertension    Hypothyroidism    Myasthenia gravis (HCC) 2013   followed at [facility] Dr Jeremy Johann, diplopia only   Osteoarthritis    see's Dr. Charlett Blake   Osteoporosis    last DEXA 08-03-11   Past Surgical History:  Procedure Laterality Date   ABDOMINAL HYSTERECTOMY     APPENDECTOMY     BREAST LUMPECTOMY Bilateral     several, enign breast lumps removed   BREAST LUMPECTOMY WITH RADIOACTIVE SEED AND SENTINEL LYMPH NODE BIOPSY Left 05/18/2021   Procedure: LEFT BREAST LUMPECTOMY WITH RADIOACTIVE SEED AND LEFT AXILLARY SENTINEL LYMPH NODE BIOPSY;  Surgeon: Emelia Loron, MD;  Location: [facility] OR;  Service: General;  Laterality: Left;   CARDIOVERSION N/A 12/31/2017   Procedure: CARDIOVERSION;  Surgeon: Wendall Stade, MD;  Location: [facility] ENDOSCOPY;  Service: Cardiovascular;  Laterality: N/A;   CARDIOVERSION N/A 02/11/2018    Procedure: CARDIOVERSION;  Surgeon: Wendall Stade, MD;  Location: [facility] ENDOSCOPY;  Service: Cardiovascular;  Laterality: N/A;   COLONOSCOPY  09/27/2010   per Dr. Jarold Motto, benign polyp, no repeats needed    EYE SURGERY     HERNIA REPAIR  10/18/2010   Dr. Daphine Deutscher attempted but could reduce this, the entire stomach is above the diaphragm   PORT-A-CATH REMOVAL N/A 05/18/2021   Procedure: REMOVAL PORT-A-CATH;  Surgeon: Emelia Loron, MD;  Location: [facility] OR;  Service: General;  Laterality: N/A;   PORTACATH PLACEMENT Right 01/25/2021   Procedure: INSERTION PORT-A-CATH;  Surgeon: Emelia Loron, MD;  Location: [facility] ORS;  Service: General;  Laterality: Right;  START TIME OF 8:30 AM FOR 60 MINUTES ROOM 4 [place] IQ   RADIOACTIVE SEED GUIDED AXILLARY SENTINEL LYMPH NODE Left 05/18/2021   Procedure: RADIOACTIVE SEED GUIDED LEFT AXILLARY NODE EXCISION;  Surgeon: Emelia Loron, MD;  Location: [facility] OR;  Service: General;  Laterality: Left;   THYROIDECTOMY, PARTIAL  1999   TONSILLECTOMY     Patient Active Problem List   Diagnosis Date Noted   Memory loss 02/11/2023   Port-A-Cath in place 01/26/2021   Malignant neoplasm of upper-outer quadrant of left breast  in female, estrogen receptor positive (HCC) 01/02/2021   Paroxysmal atrial fibrillation (HCC) 08/19/2019   CKD (chronic kidney disease) stage 3, GFR 30-59 ml/min (HCC) 08/09/2017   Hiatal hernia 08/09/2017   Glaucoma 08/08/2016   Restrictive lung disease 09/29/2013   Myasthenia gravis (HCC) 07/14/2012   HERNIA 07/17/2010   DIVERTICULOSIS, COLON 07/14/2010   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 level reflux 07/14/2010   EXTERNAL HEMORRHOIDS 07/13/2009   GERD 02/16/2009   OA (osteoarthritis) 07/01/2008   MITRAL VALVE PROLAPSE 11/17/2007   Hypothyroidism 07/16/2007   Dyslipidemia 07/16/2007   Essential hypertension 07/16/2007   Osteoporosis 07/16/2007    REFERRING DIAG: left breast cancer at risk for  lymphedema  THERAPY DIAG: Aftercare following surgery for neoplasm  PERTINENT HISTORY: Patient was diagnosed on 12/06/2020 with left grade III invasive ductal carcinoma breast cancer. She underwent neoadjuvant chemotherapy from 01/26/2021-03/11/2021 followed by a left lumpectomy and sentinel node biopsy on 05/18/2021 with 1 of 2 lymph nodes being positive for cancer. It is ER positive, PR negative, and HER2 negative with a Ki67 of 20%. She has a hiatal hernia which is severe and limits her ability to exercise   PRECAUTIONS: left UE Lymphedema risk, None  SUBJECTIVE: Pt returns for her 3 month L-Dex screen .  PAIN:  Are you having pain? No.  SOZO SCREENING: Patient was assessed today using the SOZO machine to determine the lymphedema index score. This was compared to her baseline score. It was determined that she is within the recommended range when compared to her baseline and no further action is needed at this time. She will continue SOZO screenings. These are done every 3 months for 2 years post operatively followed by every 6 months for 2 years, and then annually.   L-DEX FLOWSHEETS - 07/01/23 0900       L-DEX LYMPHEDEMA SCREENING   Measurement Type Unilateral    L-DEX MEASUREMENT EXTREMITY Upper Extremity    POSITION  Standing    DOMINANT SIDE Right    At Risk Side Left    BASELINE SCORE (UNILATERAL) -1    L-DEX SCORE (UNILATERAL) -8    VALUE CHANGE (UNILAT) -7             P: Begin 6 month L-Dex screens next.   Hermenia Bers, PTA 07/01/2023, 9:20 AM

## 2023-07-25 ENCOUNTER — Other Ambulatory Visit: Payer: Self-pay | Admitting: Family Medicine

## 2023-08-02 ENCOUNTER — Other Ambulatory Visit: Payer: Self-pay | Admitting: Family Medicine

## 2023-08-16 ENCOUNTER — Other Ambulatory Visit: Payer: Self-pay | Admitting: Family Medicine

## 2023-08-21 ENCOUNTER — Other Ambulatory Visit: Payer: Self-pay | Admitting: Hematology and Oncology

## 2023-08-29 ENCOUNTER — Other Ambulatory Visit: Payer: Self-pay | Admitting: Cardiovascular Disease

## 2023-08-29 DIAGNOSIS — I480 Paroxysmal atrial fibrillation: Secondary | ICD-10-CM

## 2023-08-29 NOTE — Telephone Encounter (Signed)
Prescription refill request for Xarelto received.  Indication: PAF Last office visit: 06/13/23  Burna Forts MD Weight: 68.5kg Age: 82 Scr: 1.57 on 06/19/23  Epic CrCl: 29.87  Based on above findings Xarelto 15mg  daily is the appropriate dose.  Refill approved.

## 2023-09-05 ENCOUNTER — Encounter: Payer: Self-pay | Admitting: Cardiovascular Disease

## 2023-09-05 MED ORDER — METOPROLOL SUCCINATE ER 50 MG PO TB24: 50.0000 mg | ORAL_TABLET | Freq: Every day | ORAL | 3 refills | Status: AC

## 2023-09-05 NOTE — Telephone Encounter (Signed)
Reviewed chart.  Patient was on Toprol XL 50 mg at ov in Feb 2024, there was a refill sent in May that changed the directions to 2 in AM, 1 in PM.  The next ov in Aug still recommends "low dose Toprol as higher doses bother patient's myasthenia gravis".  I replied to patient and send previous dose of Toprol XL 50 mg daily, which the patient reports she has been on and tolerating.    If MD has other recommendations we will let her know.

## 2023-09-25 ENCOUNTER — Encounter: Payer: Self-pay | Admitting: Hematology and Oncology

## 2023-09-25 NOTE — Telephone Encounter (Signed)
Telephone call  

## 2023-11-06 ENCOUNTER — Other Ambulatory Visit: Payer: Self-pay | Admitting: Family Medicine

## 2023-11-15 ENCOUNTER — Encounter: Payer: Self-pay | Admitting: Cardiovascular Disease

## 2023-11-20 ENCOUNTER — Other Ambulatory Visit: Payer: Self-pay | Admitting: Family Medicine

## 2023-11-22 ENCOUNTER — Encounter: Payer: Self-pay | Admitting: Cardiovascular Disease

## 2023-12-09 ENCOUNTER — Other Ambulatory Visit: Payer: Self-pay | Admitting: Family Medicine

## 2023-12-10 ENCOUNTER — Other Ambulatory Visit: Payer: Self-pay | Admitting: *Deleted

## 2023-12-10 DIAGNOSIS — Z170 Estrogen receptor positive status [ER+]: Secondary | ICD-10-CM

## 2023-12-11 ENCOUNTER — Inpatient Hospital Stay: Payer: Medicare HMO | Attending: Hematology and Oncology

## 2023-12-11 ENCOUNTER — Inpatient Hospital Stay: Payer: Medicare HMO

## 2023-12-11 VITALS — BP 127/76 | HR 77 | Temp 97.8°F | Resp 16

## 2023-12-11 DIAGNOSIS — C50412 Malignant neoplasm of upper-outer quadrant of left female breast: Secondary | ICD-10-CM | POA: Insufficient documentation

## 2023-12-11 DIAGNOSIS — Z170 Estrogen receptor positive status [ER+]: Secondary | ICD-10-CM | POA: Diagnosis not present

## 2023-12-11 DIAGNOSIS — M810 Age-related osteoporosis without current pathological fracture: Secondary | ICD-10-CM | POA: Insufficient documentation

## 2023-12-11 LAB — CMP (CANCER CENTER ONLY)
ALT: 15 U/L (ref 0–44)
AST: 18 U/L (ref 15–41)
Albumin: 3.6 g/dL (ref 3.5–5.0)
Alkaline Phosphatase: 51 U/L (ref 38–126)
Anion gap: 5 (ref 5–15)
BUN: 25 mg/dL — ABNORMAL HIGH (ref 8–23)
CO2: 29 mmol/L (ref 22–32)
Calcium: 9.5 mg/dL (ref 8.9–10.3)
Chloride: 105 mmol/L (ref 98–111)
Creatinine: 1.43 mg/dL — ABNORMAL HIGH (ref 0.44–1.00)
GFR, Estimated: 37 mL/min — ABNORMAL LOW (ref >60)
Glucose, Bld: 79 mg/dL (ref 70–99)
Potassium: 3.4 mmol/L — ABNORMAL LOW (ref 3.5–5.1)
Sodium: 139 mmol/L (ref 135–145)
Total Bilirubin: 0.8 mg/dL (ref 0.0–1.2)
Total Protein: 6.8 g/dL (ref 6.5–8.1)

## 2023-12-11 LAB — CBC WITH DIFFERENTIAL (CANCER CENTER ONLY)
Abs Immature Granulocytes: 0.05 10*3/uL (ref 0.00–0.07)
Basophils Absolute: 0 10*3/uL (ref 0.0–0.1)
Basophils Relative: 0 %
Eosinophils Absolute: 0.1 10*3/uL (ref 0.0–0.5)
Eosinophils Relative: 1 %
HCT: 43.6 % (ref 36.0–46.0)
Hemoglobin: 14.3 g/dL (ref 12.0–15.0)
Immature Granulocytes: 1 %
Lymphocytes Relative: 18 %
Lymphs Abs: 1.4 10*3/uL (ref 0.7–4.0)
MCH: 30.6 pg (ref 26.0–34.0)
MCHC: 32.8 g/dL (ref 30.0–36.0)
MCV: 93.2 fL (ref 80.0–100.0)
Monocytes Absolute: 0.5 10*3/uL (ref 0.1–1.0)
Monocytes Relative: 6 %
Neutro Abs: 5.9 10*3/uL (ref 1.7–7.7)
Neutrophils Relative %: 74 %
Platelet Count: 192 10*3/uL (ref 150–400)
RBC: 4.68 MIL/uL (ref 3.87–5.11)
RDW: 14.6 % (ref 11.5–15.5)
WBC Count: 7.9 10*3/uL (ref 4.0–10.5)
nRBC: 0 % (ref 0.0–0.2)

## 2023-12-11 MED ORDER — DENOSUMAB 60 MG/ML ~~LOC~~ SOSY
60.0000 mg | PREFILLED_SYRINGE | Freq: Once | SUBCUTANEOUS | Status: AC
  Administered 2023-12-11: 60 mg via SUBCUTANEOUS
  Filled 2023-12-11: qty 1

## 2023-12-26 NOTE — Therapy (Incomplete)
OUTPATIENT PHYSICAL THERAPY SOZO SCREENING NOTE   Patient Name: Christina Jacobs MRN: [id number] DOB:1941-03-06, 83 y.o., female Today's Date: 12/26/2023  PCP: Nelwyn Salisbury, MD REFERRING PROVIDER: Emelia Loron, MD     Past Medical History:  Diagnosis Date   Adenomatous colon polyp    Anal fissure    Anxiety    Asthma    Dr. Craige Cotta   Atrial fibrillation [facility])    Bowel obstruction (HCC)    Bursitis    CKD (chronic kidney disease) stage 3, GFR 30-59 ml/min (HCC)    sees Dr. Dayle Points    Complication of anesthesia    post op N/V   Diverticulosis    Dyspnea    After a meal   Dysrhythmia    Afib   GERD (gastroesophageal reflux disease)    Glaucoma    Hiatal hernia    Giant type IV hiatal hernia; sees Dr. Ranjan Iraq at [facility]   Hyperlipidemia    Hypertension    Hypothyroidism    Myasthenia gravis (HCC) 2013   followed at [facility] Dr Jeremy Johann, diplopia only   Osteoarthritis    see's Dr. Charlett Blake   Osteoporosis    last DEXA 08-03-11   Past Surgical History:  Procedure Laterality Date   ABDOMINAL HYSTERECTOMY     APPENDECTOMY     BREAST LUMPECTOMY Bilateral     several, enign breast lumps removed   BREAST LUMPECTOMY WITH RADIOACTIVE SEED AND SENTINEL LYMPH NODE BIOPSY Left 05/18/2021   Procedure: LEFT BREAST LUMPECTOMY WITH RADIOACTIVE SEED AND LEFT AXILLARY SENTINEL LYMPH NODE BIOPSY;  Surgeon: Emelia Loron, MD;  Location: [facility] OR;  Service: General;  Laterality: Left;   CARDIOVERSION N/A 12/31/2017   Procedure: CARDIOVERSION;  Surgeon: Wendall Stade, MD;  Location: [facility] ENDOSCOPY;  Service: Cardiovascular;  Laterality: N/A;   CARDIOVERSION N/A 02/11/2018   Procedure: CARDIOVERSION;  Surgeon: Wendall Stade, MD;  Location: [facility] ENDOSCOPY;  Service: Cardiovascular;  Laterality: N/A;   COLONOSCOPY  09/27/2010   per Dr. Jarold Motto, benign polyp, no repeats needed    EYE SURGERY     HERNIA REPAIR  10/18/2010   Dr. Daphine Deutscher attempted but could reduce  this, the entire stomach is above the diaphragm   PORT-A-CATH REMOVAL N/A 05/18/2021   Procedure: REMOVAL PORT-A-CATH;  Surgeon: Emelia Loron, MD;  Location: [facility] OR;  Service: General;  Laterality: N/A;   PORTACATH PLACEMENT Right 01/25/2021   Procedure: INSERTION PORT-A-CATH;  Surgeon: Emelia Loron, MD;  Location: [facility] ORS;  Service: General;  Laterality: Right;  START TIME OF 8:30 AM FOR 60 MINUTES ROOM 4 [place] IQ   RADIOACTIVE SEED GUIDED AXILLARY SENTINEL LYMPH NODE Left 05/18/2021   Procedure: RADIOACTIVE SEED GUIDED LEFT AXILLARY NODE EXCISION;  Surgeon: Emelia Loron, MD;  Location: [facility] OR;  Service: General;  Laterality: Left;   THYROIDECTOMY, PARTIAL  1999   TONSILLECTOMY     Patient Active Problem List   Diagnosis Date Noted   Memory loss 02/11/2023   Port-A-Cath in place 01/26/2021   Malignant neoplasm of upper-outer quadrant of left breast in female, estrogen receptor positive (HCC) 01/02/2021   Paroxysmal atrial fibrillation (HCC) 08/19/2019   CKD (chronic kidney disease) stage 3, GFR 30-59 ml/min (HCC) 08/09/2017   Hiatal hernia 08/09/2017   Glaucoma 08/08/2016   Restrictive lung disease 09/29/2013   Myasthenia gravis (HCC) 07/14/2012   Intra-abdominal hernia 07/17/2010   Diverticulosis of colon 07/14/2010   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 level reflux 07/14/2010  External hemorrhoids 07/13/2009   GERD 02/16/2009   OA (osteoarthritis) 07/01/2008   Mitral valve disease 11/17/2007   Hypothyroidism 07/16/2007   Dyslipidemia 07/16/2007   Essential hypertension 07/16/2007   Osteoporosis 07/16/2007    REFERRING DIAG: left breast cancer at risk for lymphedema  THERAPY DIAG: No diagnosis found.  PERTINENT HISTORY: Patient was diagnosed on 12/06/2020 with left grade III invasive ductal carcinoma breast cancer. She underwent neoadjuvant chemotherapy from 01/26/2021-03/11/2021 followed by a left lumpectomy and sentinel node biopsy on  05/18/2021 with 1 of 2 lymph nodes being positive for cancer. It is ER positive, PR negative, and HER2 negative with a Ki67 of 20%. She has a hiatal hernia which is severe and limits her ability to exercise   PRECAUTIONS: left UE Lymphedema risk, None  SUBJECTIVE: Pt returns for her 3 month L-Dex screen .  PAIN:  Are you having pain? No.  SOZO SCREENING: Patient was assessed today using the SOZO machine to determine the lymphedema index score. This was compared to her baseline score. It was determined that she is within the recommended range when compared to her baseline and no further action is needed at this time. She will continue SOZO screenings. These are done every 3 months for 2 years post operatively followed by every 6 months for 2 years, and then annually.     P: Begin 6 month L-Dex screens next.   [address] [place], [place] 12/26/2023, 3:42 PM

## 2023-12-27 ENCOUNTER — Other Ambulatory Visit: Payer: Self-pay | Admitting: Family Medicine

## 2023-12-30 ENCOUNTER — Ambulatory Visit: Payer: Medicare HMO | Attending: General Surgery | Admitting: Physical Therapy

## 2023-12-30 DIAGNOSIS — Z483 Aftercare following surgery for neoplasm: Secondary | ICD-10-CM | POA: Insufficient documentation

## 2024-01-31 ENCOUNTER — Other Ambulatory Visit: Payer: Self-pay | Admitting: Family Medicine

## 2024-02-10 ENCOUNTER — Encounter: Payer: Self-pay | Admitting: Hematology and Oncology

## 2024-02-12 ENCOUNTER — Other Ambulatory Visit: Payer: Self-pay | Admitting: Family Medicine

## 2024-02-29 ENCOUNTER — Other Ambulatory Visit: Payer: Self-pay | Admitting: Family Medicine

## 2024-02-29 ENCOUNTER — Other Ambulatory Visit: Payer: Self-pay | Admitting: Cardiovascular Disease

## 2024-02-29 DIAGNOSIS — I480 Paroxysmal atrial fibrillation: Secondary | ICD-10-CM

## 2024-03-10 NOTE — Progress Notes (Signed)
 Cardiology Office Note   Date:  03/13/2024   ID:  NASHALIE RINN, DOB [birth year], MRN [id number]  PCP:  Donley Furth, MD  Cardiologist:  Dr. Stann Earnest, MD  No chief complaint on file.   History of Present Illness: Christina Jacobs is a 83 y.o. female who presents for follow-up of paroxysmal atrial fibrillation diagnosed 12/2017 previously on Eliquis  however with an allergic reaction transition to Xarelto  for anticoagulation (low-dose secondary to CKD).  She previously underwent DCCV cardioversion however this failed and she was started on flecainide  after normal ETT 01/31/2018.  Echocardiogram from 12/2017 with an EF at 55 to 60% and no structural heart disease.  She ultimately underwent successful DCCV while on a AT 01/2018.  Post cardioversion, she wore a monitor which showed 99% NSR.  Also with a history of CKD stage III, HLD, HTN, hypothyroidism, myasthenia gravis and  diagnosis of breast cancer s/p left breast radioactive seed guided lumpectomy, sentinel lymph node biopsy and lymph node excision on 05/18/2021.with chemo and XRT done 08/2021   On low-dose Toprol  as higher doses exacerbates her myasthenia gravis.    She sees [facility] for breast cancer left lumpectomy 2022 with chemo and XRT completed 08/14/21  adjuvant Letrozole  antiestrogen Rx  Some insomnia using melatonin Still anxious about prognosis from cancer Had some palpitations with XRT but no PAF   Norvasc  added 07/02/22 She has been having more pedal edema Her nephrologist stopped her diuretic last year due to Cr 1.6 range Seen by Dr Alyne Babinski and med list includes Maxzide and Lasix  BMET 11/27/22 with Cr 1.39 BUN 26 and K 4.0   Discussed stopping Norvasc  with worse peripheral edema in future if needed  Started on Aricept  for memory loss by Dr Alyne Babinski  She was concerned about Prolia  and I told her lots of my patients were in it and tolerated well   Aricept  not helpful and d/c Edema in ankles Does not like lasix  due to incontinence  Discussed stopping Norvasc  Some stress after moving to smaller town home from primary residence   Past Medical History:  Diagnosis Date   Adenomatous colon polyp    Anal fissure    Anxiety    Asthma    Dr. Matilde Son   Atrial fibrillation [employer])    Bowel obstruction (HCC)    Bursitis    CKD (chronic kidney disease) stage 3, GFR 30-59 ml/min (HCC)    sees Dr. Midge Albert    Complication of anesthesia    post op N/V   Diverticulosis    Dyspnea    After a meal   Dysrhythmia    Afib   GERD (gastroesophageal reflux disease)    Glaucoma    Hiatal hernia    Giant type IV hiatal hernia; sees Dr. Ranjan Iraq at [facility]   Hyperlipidemia    Hypertension    Hypothyroidism    Myasthenia gravis (HCC) 2013   followed at [facility] Dr Madolyn Schlatter, diplopia only   Osteoarthritis    see's Dr. [place]   Osteoporosis    last DEXA 08-03-11    Past Surgical History:  Procedure Laterality Date   ABDOMINAL HYSTERECTOMY     APPENDECTOMY     BREAST LUMPECTOMY Bilateral     several, enign breast lumps removed   BREAST LUMPECTOMY WITH RADIOACTIVE SEED AND SENTINEL LYMPH NODE BIOPSY Left 05/18/2021   Procedure: LEFT BREAST LUMPECTOMY WITH RADIOACTIVE SEED AND LEFT AXILLARY SENTINEL LYMPH NODE BIOPSY;  Surgeon: Enid Harry, MD;  Location: [facility] OR;  Service: General;  Laterality: Left;   CARDIOVERSION N/A 12/31/2017   Procedure: CARDIOVERSION;  Surgeon: Loyde Rule, MD;  Location: [facility] ENDOSCOPY;  Service: Cardiovascular;  Laterality: N/A;   CARDIOVERSION N/A 02/11/2018   Procedure: CARDIOVERSION;  Surgeon: Loyde Rule, MD;  Location: [facility] ENDOSCOPY;  Service: Cardiovascular;  Laterality: N/A;   COLONOSCOPY  09/27/2010   per Dr. Adan Holms, benign polyp, no repeats needed    EYE SURGERY     HERNIA REPAIR  10/18/2010   Dr. Gaylyn Keas attempted but could reduce this, the entire stomach is above the diaphragm   PORT-A-CATH REMOVAL N/A 05/18/2021   Procedure: REMOVAL PORT-A-CATH;  Surgeon:  Enid Harry, MD;  Location: [facility] OR;  Service: General;  Laterality: N/A;   PORTACATH PLACEMENT Right 01/25/2021   Procedure: INSERTION PORT-A-CATH;  Surgeon: Enid Harry, MD;  Location: [facility] ORS;  Service: General;  Laterality: Right;  START TIME OF 8:30 AM FOR 60 MINUTES ROOM 4 [place] [facility]   RADIOACTIVE SEED GUIDED AXILLARY SENTINEL LYMPH NODE Left 05/18/2021   Procedure: RADIOACTIVE SEED GUIDED LEFT AXILLARY NODE EXCISION;  Surgeon: Enid Harry, MD;  Location: [facility] OR;  Service: General;  Laterality: Left;   THYROIDECTOMY, PARTIAL  1999   TONSILLECTOMY       Current Outpatient Medications  Medication Sig Dispense Refill   albuterol  (VENTOLIN  HFA) 108 (90 Base) MCG/ACT inhaler INHALE 2 PUFFS INTO THE LUNGS EVERY 4 HOURS AS NEEDED FOR WHEEZE OR FOR SHORTNESS OF BREATH 18 each 5   atorvastatin  (LIPITOR) 10 MG tablet TAKE 1 TABLET BY MOUTH EVERY DAY 90 tablet 1   flecainide  (TAMBOCOR ) 50 MG tablet TAKE 1 TABLET BY MOUTH TWICE A DAY 60 tablet 0   furosemide  (LASIX ) 20 MG tablet Take 1 tablet (20 mg total) by mouth daily as needed for edema. 30 tablet 3   latanoprost (XALATAN) 0.005 % ophthalmic solution Place 1 drop into both eyes at bedtime.     letrozole  (FEMARA ) 2.5 MG tablet TAKE 1 TABLET BY MOUTH EVERY DAY 90 tablet 3   levothyroxine  (SYNTHROID ) 100 MCG tablet TAKE 1 TABLET BY MOUTH EVERY DAY 90 tablet 3   metoprolol  succinate (TOPROL  XL) 50 MG 24 hr tablet Take 1 tablet (50 mg total) by mouth daily. Take with or immediately following a meal. 90 tablet 3   Multiple Vitamin (MULTIVITAMIN WITH MINERALS) TABS tablet Take 1 tablet by mouth in the morning. Centrum for Women     omeprazole  (PRILOSEC) 40 MG capsule TAKE 1 CAPSULE BY MOUTH EVERY DAY AS NEEDED FOR REFLUX 90 capsule 0   predniSONE  (DELTASONE ) 1 MG tablet Take 4 tablets (4 mg total) by mouth daily with breakfast.     Rivaroxaban  (XARELTO ) 15 MG TABS tablet TAKE 1 TABLET (15 MG TOTAL) BY MOUTH DAILY WITH SUPPER 30 tablet  0   timolol (TIMOPTIC) 0.5 % ophthalmic solution Place 1 drop into both eyes in the morning and at bedtime.     triamterene -hydrochlorothiazide (MAXZIDE-25) 37.5-25 MG tablet TAKE 1 TABLET BY MOUTH EVERY DAY 90 tablet 3   No current facility-administered medications for this visit.    Allergies:   Codeine, Eliquis  [apixaban ], Meperidine hcl, Propoxyphene hcl, and Tape    Social History:  The patient  reports that she has never smoked. She has never used smokeless tobacco. She reports that she does not drink alcohol and does not use drugs.   Family History:  The patient's family history includes Breast cancer in her maternal grandmother; Cancer in  her brother, father, and sister; Clotting disorder in her brother; Colon cancer (age of onset: 81) in her son; Diabetes in her father; Hyperlipidemia in her father; Hypertension in her father; Kidney cancer in her mother.    ROS:  Please see the history of present illness. Otherwise, review of systems are positive for none.   All other systems are reviewed and negative.    PHYSICAL EXAM: VS:  BP 124/80   Pulse 64   Ht 5\' 7"  (1.702 m)   Wt 151 lb (68.5 kg)   SpO2 94%   BMI 23.65 kg/m  , BMI Body mass index is 23.65 kg/m.  Affect appropriate Healthy:  appears stated age HEENT: normal Neck supple with no adenopathy JVP normal no bruits no thyromegaly Lungs clear with no wheezing and good diaphragmatic motion Heart:  S1/S2 no murmur, no rub, gallop or click PMI normal Abdomen: benighn, BS positve, no tenderness, no AAA no bruit.  No HSM or HJR Distal pulses intact with no bruits No edema Neuro non-focal Skin warm and dry No muscular weakness    EKG:  SR rate 61 nonspecific ST changes 01/23/21   Recent Labs: 12/11/2023: ALT 15; BUN 25; Creatinine 1.43; Hemoglobin 14.3; Platelet Count 192; Potassium 3.4; Sodium 139    Lipid Panel    Component Value Date/Time   CHOL 200 11/27/2022 1026   TRIG 115.0 11/27/2022 1026   HDL 59.90  11/27/2022 1026   CHOLHDL 3 11/27/2022 1026   VLDL 23.0 11/27/2022 1026   LDLCALC 117 (H) 11/27/2022 1026   LDLCALC 91 08/23/2020 1144   LDLDIRECT 142.2 07/27/2013 1007      Wt Readings from Last 3 Encounters:  03/13/24 151 lb (68.5 kg)  07/01/23 154 lb (69.9 kg)  06/13/23 151 lb (68.5 kg)     ASSESSMENT AND PLAN:  1. PAF: -Maintaining NSR  -Continue Flecainide , Xarelto , and beta blocker  -No s/s of bleeding in stool or urine.  -CBC stable on recent labs -On reduced dose Xarelto  due to renal dysfunction   2. CKD stage III: -Creatinine, 1.43  -Follows with nephrology  Dr Henretta Lodge     3.  HLD:  -Last LDL, 91 -Continue atorvastatin    4. HTN: -continue current meds  - consider d/c norvasc  if edema worse   5. Breast cancer: -F/U oncology chemo/XRT Anti estrogen Letrozole  Stage 3A  Mammogram 02/04/23 normal On Prolia  for osteoporosis   6. Memory Loss:  subtle on Aricept  no excessive bradycardia   7. Peripheral Edema:  d/c norvasc  continue Maxide and PRN lasix  when home    Current medicines are reviewed at length with the patient today.  The patient does not have concerns regarding medicines.  The following changes have been made:  None  Labs/ tests ordered today include:    None  No orders of the defined types were placed in this encounter.    Disposition:   FU in 6 months   Signed, Janelle Mediate, MD  03/13/2024 9:11 AM    [facility] [address] [place], [place], [place]  [address] Phone: [phone]; Fax: [phone]

## 2024-03-13 ENCOUNTER — Ambulatory Visit: Payer: Medicare HMO | Attending: Cardiology | Admitting: Cardiovascular Disease

## 2024-03-13 ENCOUNTER — Encounter: Payer: Self-pay | Admitting: Cardiovascular Disease

## 2024-03-13 VITALS — BP 124/80 | HR 64 | Ht 67.0 in | Wt 151.0 lb

## 2024-03-13 DIAGNOSIS — I10 Essential (primary) hypertension: Secondary | ICD-10-CM

## 2024-03-13 DIAGNOSIS — I480 Paroxysmal atrial fibrillation: Secondary | ICD-10-CM

## 2024-03-13 DIAGNOSIS — R600 Localized edema: Secondary | ICD-10-CM

## 2024-03-13 NOTE — Patient Instructions (Addendum)
 Medication Instructions:  Your physician has recommended you make the following change in your medication:  1-STOP Norvasc   *If you need a refill on your cardiac medications before your next appointment, please call your pharmacy*  Lab Work: If you have labs (blood work) drawn today and your tests are completely normal, you will receive your results only by: MyChart Message (if you have MyChart) OR A paper copy in the mail If you have any lab test that is abnormal or we need to change your treatment, we will call you to review the results.  Testing/Procedures: None ordered today.  Follow-Up: At [facility], you and your health needs are our priority.  As part of our continuing mission to provide you with exceptional heart care, our providers are all part of one team.  This team includes your primary Cardiologist (physician) and Advanced Practice Providers or APPs (Physician Assistants and Nurse Practitioners) who all work together to provide you with the care you need, when you need it.  Your next appointment:   6 month(s)  Provider:   Janelle Mediate, MD    We recommend signing up for the patient portal called "MyChart".  Sign up information is provided on this After Visit Summary.  MyChart is used to connect with patients for Virtual Visits (Telemedicine).  Patients are able to view lab/test results, encounter notes, upcoming appointments, etc.  Non-urgent messages can be sent to your provider as well.   To learn more about what you can do with MyChart, go to [email or web address].   Other Instructions

## 2024-03-26 ENCOUNTER — Other Ambulatory Visit: Payer: Self-pay | Admitting: Hematology and Oncology

## 2024-03-26 ENCOUNTER — Other Ambulatory Visit: Payer: Self-pay | Admitting: Cardiovascular Disease

## 2024-03-26 DIAGNOSIS — I480 Paroxysmal atrial fibrillation: Secondary | ICD-10-CM

## 2024-03-26 NOTE — Telephone Encounter (Signed)
 Prescription refill request for Xarelto  received.  Indication: afib  Last office visit: Christina Jacobs 03/13/2024 Weight: 68.5 kg  Age: 83 yo  Scr: 1.43, 12/11/2023 CrCl: 33 ml/min    Refill sent.

## 2024-04-17 ENCOUNTER — Other Ambulatory Visit: Payer: Self-pay

## 2024-04-17 MED ORDER — FLECAINIDE ACETATE 50 MG PO TABS: 50.0000 mg | ORAL_TABLET | Freq: Two times a day (BID) | ORAL | 3 refills | Status: AC

## 2024-05-04 ENCOUNTER — Telehealth: Payer: Self-pay | Admitting: Family Medicine

## 2024-05-04 MED ORDER — ALBUTEROL SULFATE HFA 108 (90 BASE) MCG/ACT IN AERS: 2.0000 | INHALATION_SPRAY | RESPIRATORY_TRACT | 5 refills | Status: AC | PRN

## 2024-05-04 NOTE — Telephone Encounter (Signed)
 Done

## 2024-05-18 ENCOUNTER — Ambulatory Visit: Payer: Medicare HMO | Admitting: Hematology and Oncology

## 2024-06-09 ENCOUNTER — Telehealth: Payer: Self-pay

## 2024-06-09 ENCOUNTER — Other Ambulatory Visit: Payer: Self-pay | Admitting: *Deleted

## 2024-06-09 ENCOUNTER — Other Ambulatory Visit: Payer: Self-pay | Admitting: Family Medicine

## 2024-06-09 DIAGNOSIS — C50412 Malignant neoplasm of upper-outer quadrant of left female breast: Secondary | ICD-10-CM

## 2024-06-09 NOTE — Telephone Encounter (Signed)
 Left message to confirm appt for 8/6

## 2024-06-10 ENCOUNTER — Inpatient Hospital Stay: Payer: Medicare HMO

## 2024-06-10 ENCOUNTER — Inpatient Hospital Stay: Attending: Hematology and Oncology

## 2024-06-10 ENCOUNTER — Inpatient Hospital Stay: Payer: Medicare HMO | Admitting: Hematology and Oncology

## 2024-06-10 ENCOUNTER — Inpatient Hospital Stay: Admitting: Hematology and Oncology

## 2024-06-10 ENCOUNTER — Inpatient Hospital Stay

## 2024-06-10 VITALS — BP 143/76 | HR 86 | Temp 98.3°F | Resp 17 | Wt 151.5 lb

## 2024-06-10 DIAGNOSIS — Z170 Estrogen receptor positive status [ER+]: Secondary | ICD-10-CM

## 2024-06-10 DIAGNOSIS — C50412 Malignant neoplasm of upper-outer quadrant of left female breast: Secondary | ICD-10-CM

## 2024-06-10 DIAGNOSIS — Z1721 Progesterone receptor positive status: Secondary | ICD-10-CM | POA: Diagnosis not present

## 2024-06-10 DIAGNOSIS — Z1732 Human epidermal growth factor receptor 2 negative status: Secondary | ICD-10-CM | POA: Diagnosis not present

## 2024-06-10 DIAGNOSIS — M810 Age-related osteoporosis without current pathological fracture: Secondary | ICD-10-CM | POA: Diagnosis present

## 2024-06-10 LAB — CMP (CANCER CENTER ONLY)
ALT: 14 U/L (ref 0–44)
AST: 17 U/L (ref 15–41)
Albumin: 3.8 g/dL (ref 3.5–5.0)
Alkaline Phosphatase: 49 U/L (ref 38–126)
Anion gap: 4 — ABNORMAL LOW (ref 5–15)
BUN: 24 mg/dL — ABNORMAL HIGH (ref 8–23)
CO2: 31 mmol/L (ref 22–32)
Calcium: 9.6 mg/dL (ref 8.9–10.3)
Chloride: 106 mmol/L (ref 98–111)
Creatinine: 1.57 mg/dL — ABNORMAL HIGH (ref 0.44–1.00)
GFR, Estimated: 33 mL/min — ABNORMAL LOW (ref >60)
Glucose, Bld: 97 mg/dL (ref 70–99)
Potassium: 4.5 mmol/L (ref 3.5–5.1)
Sodium: 141 mmol/L (ref 135–145)
Total Bilirubin: 0.8 mg/dL (ref 0.0–1.2)
Total Protein: 6.8 g/dL (ref 6.5–8.1)

## 2024-06-10 LAB — CBC WITH DIFFERENTIAL (CANCER CENTER ONLY)
Abs Immature Granulocytes: 0.03 K/uL (ref 0.00–0.07)
Basophils Absolute: 0 K/uL (ref 0.0–0.1)
Basophils Relative: 1 %
Eosinophils Absolute: 0.1 K/uL (ref 0.0–0.5)
Eosinophils Relative: 1 %
HCT: 42.9 % (ref 36.0–46.0)
Hemoglobin: 14.4 g/dL (ref 12.0–15.0)
Immature Granulocytes: 0 %
Lymphocytes Relative: 14 %
Lymphs Abs: 1.2 K/uL (ref 0.7–4.0)
MCH: 31.4 pg (ref 26.0–34.0)
MCHC: 33.6 g/dL (ref 30.0–36.0)
MCV: 93.5 fL (ref 80.0–100.0)
Monocytes Absolute: 0.6 K/uL (ref 0.1–1.0)
Monocytes Relative: 6 %
Neutro Abs: 6.9 K/uL (ref 1.7–7.7)
Neutrophils Relative %: 78 %
Platelet Count: 160 K/uL (ref 150–400)
RBC: 4.59 MIL/uL (ref 3.87–5.11)
RDW: 14.9 % (ref 11.5–15.5)
WBC Count: 8.9 K/uL (ref 4.0–10.5)
nRBC: 0 % (ref 0.0–0.2)

## 2024-06-10 MED ORDER — DENOSUMAB 60 MG/ML ~~LOC~~ SOSY
60.0000 mg | PREFILLED_SYRINGE | Freq: Once | SUBCUTANEOUS | Status: AC
Start: 2024-06-10 — End: 2024-06-10
  Administered 2024-06-10: 60 mg via SUBCUTANEOUS
  Filled 2024-06-10: qty 1

## 2024-06-10 NOTE — Progress Notes (Unsigned)
 HEMATOLOGY-ONCOLOGY  PROGRESS NOTE  History of Present Illness: Christina Jacobs is a 83 y.o. with above-mentioned history of breast cancer having undergone chemotherapy. Currently on letrozole . She presents to the clinic today for a telephone follow-up to review bone density.   Oncology History  Malignant neoplasm of upper-outer quadrant of left breast in female, estrogen receptor positive (HCC)  01/02/2021 Initial Diagnosis   Patient experienced chronic left breast tenderness for 2 years with a negative mammogram last year. Screening mammogram showed a 2.5cm central left breast mass and one enlarged lymph node. US  showed a 2.6cm mass at the 12 o'clock position and a 1.3cm mass in the left axillary tail. 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 Ebbie: Residual IDC 2.2 cm, DCIS intermediate grade, margins negative, 1/2 lymph nodes positive ER 40% weak, PR negative, HER2 negative, Ki-67 20%   05/18/2021 Cancer Staging   Staging form: Breast, AJCC 8th Edition - Pathologic stage from 05/18/2021: No Stage Recommended (ypT2, pN1a, cM0, G2) - Signed by Crawford Morna Pickle, NP on 10/31/2021 Stage prefix: Post-therapy Histologic grading system: 3 grade system   06/27/2021 - 08/15/2021 Radiation Therapy   Site Technique Total Dose (Gy) Dose per Fx (Gy) Completed Fx Beam Energies  Breast, Left: Breast_Lt 3D 50.4/50.4 1.8 28/28 6X, 10X  Breast, Left:  Breast_Lt_SCLV 3D 50.4/50.4 1.8 28/28 6X, 10X  Breast, Left: Breast_Lt_Bst 3D 10/10 2 5/5 6X, 10X     08/19/2021 - 08/2028 Anti-estrogen oral therapy   Letrozole  1 mg daily     REVIEW OF SYSTEMS:   Constitutional: Denies fevers, chills or abnormal weight loss All other systems were reviewed with the patient and are negative. Observations/Objective:     Assessment Plan:  Malignant neoplasm of upper-outer quadrant of left breast in female, estrogen receptor positive (HCC) 01/02/2021: Screening mammogram detected left breast mass at 12 o'clock position 2.5 cm spiculated mass.  Abnormal left axillary lymph node 1.3 cm.  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  Neoadjuvant chemotherapy with 4 cycles of Taxotere  and Cytoxan  completed 03/30/2021 2. 05/18/2021: Left lumpectomy with Dr. Ebbie: Residual IDC 2.2 cm, DCIS intermediate grade, margins negative, 1/2 lymph nodes positive ER 40% weak, PR negative, HER2 negative, Ki-67 20% 3.  Adjuvant radiation completed 08/14/21 4.  Follow-up adjuvant antiestrogen therapy with letrozole  1 mg daily x7 years started 08/19/21 ------------------------------------------------------------------------------------------------------------  letrozole  toxicities: denies any side effects   Osteoporosis: Bones T score -2.8: Osteoporosis: Taking calcium  and vitamin D .  Patient stopped taking Fosamax  because of adverse effects  Bone density 06/05/2023: T-score -3.7: Worsening osteoporosis: Recommended Prolia  every 6 months  02/03/2022: CT CAP: No evidence of metastatic disease.  Interval increase in the right ovarian cyst 4.9 cm sees Dr. Viktoria  Breast cancer surveillance: 1.  Mammogram 02/04/2023: [facility]: Benign, breast density category C 2. breast exam 05/16/2023: Benign We  discussed the pros and cons of obtaining Signatera blood testing and decided not to pursue it.  Patient does not have any dental issues.  She sees her dentist every 6 months.  Okay to proceed without a formal dental clearance certificate for Prolia .  Return to clinic in 1 year for follow-up   I have reviewed the above documentation for accuracy and completeness, and I agree with the above.

## 2024-06-11 ENCOUNTER — Encounter: Payer: Self-pay | Admitting: Hematology and Oncology

## 2024-07-23 ENCOUNTER — Other Ambulatory Visit: Payer: Self-pay | Admitting: Family Medicine

## 2024-08-13 ENCOUNTER — Other Ambulatory Visit: Payer: Self-pay | Admitting: Family Medicine

## 2024-08-13 MED ORDER — ATORVASTATIN CALCIUM 10 MG PO TABS: 10.0000 mg | ORAL_TABLET | Freq: Every day | ORAL | 1 refills | Status: AC

## 2024-08-13 NOTE — Telephone Encounter (Signed)
 Copied from CRM [id number]. Topic: Clinical - Medication Refill >> Aug 13, 2024 11:24 AM Larissa RAMAN wrote: Medication: atorvastatin  (LIPITOR) 10 MG tablet  Has the patient contacted their pharmacy? Yes, contact pcp (Agent: If no, request that the patient contact the pharmacy for the refill. If patient does not wish to contact the pharmacy document the reason why and proceed with request.) (Agent: If yes, when and what did the pharmacy advise?)  This is the patient's preferred pharmacy:  [id number] [address], [place] - [address] [place] [address] Phone: [phone] Fax: [phone]  Is this the correct pharmacy for this prescription? Yes If no, delete pharmacy and type the correct one.   Has the prescription been filled recently? No  Is the patient out of the medication? Yes  Has the patient been seen for an appointment in the last year OR does the patient have an upcoming appointment? No  Can we respond through MyChart? Yes  Agent: Please be advised that Rx refills may take up to 3 business days. We ask that you follow-up with your pharmacy.

## 2024-09-21 NOTE — Progress Notes (Deleted)
 Cardiology Office Note   Date:  09/21/2024   ID:  Christina Jacobs, DOB 08/14/1941, MRN [id number]  PCP:  Johnny Garnette LABOR, MD  Cardiologist:  Dr. Delford, MD  No chief complaint on file.   History of Present Illness: Christina Jacobs is a 83 y.o. female who presents for follow-up of paroxysmal atrial fibrillation diagnosed 12/2017 previously on Eliquis  however with an allergic reaction transition to Xarelto  for anticoagulation (low-dose secondary to CKD).  She previously underwent DCCV cardioversion however this failed and she was started on flecainide  after normal ETT 01/31/2018.  Echocardiogram from 12/2017 with an EF at 55 to 60% and no structural heart disease.  She ultimately underwent successful DCCV while on a AT 01/2018.  Post cardioversion, she wore a monitor which showed 99% NSR.  Also with a history of CKD stage III, HLD, HTN, hypothyroidism, myasthenia gravis and  diagnosis of breast cancer s/p left breast radioactive seed guided lumpectomy, sentinel lymph node biopsy and lymph node excision on 05/18/2021.with chemo and XRT done 08/2021   On low-dose Toprol  as higher doses exacerbates her myasthenia gravis.    She sees Gudena for breast cancer left lumpectomy 2022 with chemo and XRT completed 08/14/21  adjuvant Letrozole  antiestrogen Rx  Some insomnia using melatonin Still anxious about prognosis from cancer Had some palpitations with XRT but no PAF   Norvasc  added 07/02/22 She has been having more pedal edema Her nephrologist stopped her diuretic last year due to Cr 1.6 range Seen by Dr Johnny and med list includes Maxzide and Lasix  BMET 11/27/22 with Cr 1.39 BUN 26 and K 4.0   Discussed stopping Norvasc  with worse peripheral edema in future if needed  Started on Aricept  for memory loss by Dr Johnny  She was concerned about Prolia  and I told her lots of my patients were in it and tolerated well   Aricept  not helpful and d/c Edema in ankles Does not like lasix  due to incontinence  Discussed stopping Norvasc  Some stress after moving to smaller town home from primary residence   ***  Past Medical History:  Diagnosis Date   Adenomatous colon polyp    Anal fissure    Anxiety    Asthma    Dr. Shellia   Atrial fibrillation [facility])    Bowel obstruction (HCC)    Bursitis    CKD (chronic kidney disease) stage 3, GFR 30-59 ml/min (HCC)    sees Dr. Andrez Scala    Complication of anesthesia    post op N/V   Diverticulosis    Dyspnea    After a meal   Dysrhythmia    Afib   GERD (gastroesophageal reflux disease)    Glaucoma    Hiatal hernia    Giant type IV hiatal hernia; sees Dr. Ranjan Sudan at [facility]   Hyperlipidemia    Hypertension    Hypothyroidism    Myasthenia gravis (HCC) 2013   followed at [facility] Dr Romero Chute, diplopia only   Osteoarthritis    see's Dr. Gaspar   Osteoporosis    last DEXA 08-03-11    Past Surgical History:  Procedure Laterality Date   ABDOMINAL HYSTERECTOMY     APPENDECTOMY     BREAST LUMPECTOMY Bilateral     several, enign breast lumps removed   BREAST LUMPECTOMY WITH RADIOACTIVE SEED AND SENTINEL LYMPH NODE BIOPSY Left 05/18/2021   Procedure: LEFT BREAST LUMPECTOMY WITH RADIOACTIVE SEED AND LEFT AXILLARY SENTINEL LYMPH NODE BIOPSY;  Surgeon: Ebbie Cough,  MD;  [facility]: [facility] OR;  Service: General;  Laterality: Left;   CARDIOVERSION N/A 12/31/2017   Procedure: CARDIOVERSION;  Surgeon: Delford Maude BROCKS, MD;  Location: [facility] ENDOSCOPY;  Service: Cardiovascular;  Laterality: N/A;   CARDIOVERSION N/A 02/11/2018   Procedure: CARDIOVERSION;  Surgeon: Delford Maude BROCKS, MD;  Location: [facility] ENDOSCOPY;  Service: Cardiovascular;  Laterality: N/A;   COLONOSCOPY  09/27/2010   per Dr. Jakie, benign polyp, no repeats needed    EYE SURGERY     HERNIA REPAIR  10/18/2010   Dr. Gladis attempted but could reduce this, the entire stomach is above the diaphragm   PORT-A-CATH REMOVAL N/A 05/18/2021   Procedure: REMOVAL PORT-A-CATH;  Surgeon:  Ebbie Cough, MD;  Location: [facility] OR;  Service: General;  Laterality: N/A;   PORTACATH PLACEMENT Right 01/25/2021   Procedure: INSERTION PORT-A-CATH;  Surgeon: Ebbie Cough, MD;  Location: [facility] ORS;  Service: General;  Laterality: Right;  START TIME OF 8:30 AM FOR 60 MINUTES ROOM 4 [place] IQ   RADIOACTIVE SEED GUIDED AXILLARY SENTINEL LYMPH NODE Left 05/18/2021   Procedure: RADIOACTIVE SEED GUIDED LEFT AXILLARY NODE EXCISION;  Surgeon: Ebbie Cough, MD;  Location: [facility] OR;  Service: General;  Laterality: Left;   THYROIDECTOMY, PARTIAL  1999   TONSILLECTOMY       Current Outpatient Medications  Medication Sig Dispense Refill   albuterol  (VENTOLIN  HFA) 108 (90 Base) MCG/ACT inhaler Inhale 2 puffs into the lungs every 4 (four) hours as needed for wheezing or shortness of breath. 18 each 5   atorvastatin  (LIPITOR) 10 MG tablet Take 1 tablet (10 mg total) by mouth daily. 90 tablet 1   flecainide  (TAMBOCOR ) 50 MG tablet Take 1 tablet (50 mg total) by mouth 2 (two) times daily. 180 tablet 3   furosemide  (LASIX ) 20 MG tablet Take 1 tablet (20 mg total) by mouth daily as needed for edema. 30 tablet 3   latanoprost (XALATAN) 0.005 % ophthalmic solution Place 1 drop into both eyes at bedtime.     letrozole  (FEMARA ) 2.5 MG tablet TAKE 1 TABLET BY MOUTH EVERY DAY 90 tablet 3   levothyroxine  (SYNTHROID ) 100 MCG tablet TAKE 1 TABLET BY MOUTH EVERY DAY 90 tablet 3   metoprolol  succinate (TOPROL  XL) 50 MG 24 hr tablet Take 1 tablet (50 mg total) by mouth daily. Take with or immediately following a meal. 90 tablet 3   Multiple Vitamin (MULTIVITAMIN WITH MINERALS) TABS tablet Take 1 tablet by mouth in the morning. Centrum for Women     omeprazole  (PRILOSEC) 40 MG capsule TAKE 1 CAPSULE BY MOUTH EVERY DAY AS NEEDED FOR REFLUX 90 capsule 3   predniSONE  (DELTASONE ) 1 MG tablet Take 4 tablets (4 mg total) by mouth daily with breakfast.     Rivaroxaban  (XARELTO ) 15 MG TABS tablet TAKE 1 TABLET (15 MG  TOTAL) BY MOUTH DAILY WITH SUPPER 30 tablet 5   timolol (TIMOPTIC) 0.5 % ophthalmic solution Place 1 drop into both eyes in the morning and at bedtime.     triamterene -hydrochlorothiazide (MAXZIDE-25) 37.5-25 MG tablet TAKE 1 TABLET BY MOUTH EVERY DAY 90 tablet 3   No current facility-administered medications for this visit.    Allergies:   Codeine, Eliquis  [apixaban ], Meperidine hcl, Propoxyphene hcl, and Tape    Social History:  The patient  reports that she has never smoked. She has never used smokeless tobacco. She reports that she does not drink alcohol and does not use drugs.   Family History:  The patient's family history includes  Breast cancer in her maternal grandmother; Cancer in her brother, father, and sister; Clotting disorder in her brother; Colon cancer (age of onset: 46) in her son; Diabetes in her father; Hyperlipidemia in her father; Hypertension in her father; Kidney cancer in her mother.    ROS:  Please see the history of present illness. Otherwise, review of systems are positive for none.   All other systems are reviewed and negative.    PHYSICAL EXAM: VS:  There were no vitals taken for this visit. , BMI There is no height or weight on file to calculate BMI.  Affect appropriate Healthy:  appears stated age HEENT: normal Neck supple with no adenopathy JVP normal no bruits no thyromegaly Lungs clear with no wheezing and good diaphragmatic motion Heart:  S1/S2 no murmur, no rub, gallop or click PMI normal Abdomen: benighn, BS positve, no tenderness, no AAA no bruit.  No HSM or HJR Distal pulses intact with no bruits No edema Neuro non-focal Skin warm and dry No muscular weakness    EKG:  SR rate 61 nonspecific ST changes 01/23/21   Recent Labs: 06/10/2024: ALT 14; BUN 24; Creatinine 1.57; Hemoglobin 14.4; Platelet Count 160; Potassium 4.5; Sodium 141    Lipid Panel    Component Value Date/Time   CHOL 200 11/27/2022 1026   TRIG 115.0 11/27/2022 1026    HDL 59.90 11/27/2022 1026   CHOLHDL 3 11/27/2022 1026   VLDL 23.0 11/27/2022 1026   LDLCALC 117 (H) 11/27/2022 1026   LDLCALC 91 08/23/2020 1144   LDLDIRECT 142.2 07/27/2013 1007      Wt Readings from Last 3 Encounters:  06/10/24 151 lb 8 oz (68.7 kg)  03/13/24 151 lb (68.5 kg)  07/01/23 154 lb (69.9 kg)     ASSESSMENT AND PLAN:  1. PAF: -Maintaining NSR  -Continue Flecainide , Xarelto , and beta blocker  -No s/s of bleeding in stool or urine.  -CBC stable on recent labs -On reduced dose Xarelto  due to renal dysfunction   2. CKD stage III: -Creatinine, 1.43  -Follows with nephrology  Dr Clayborn     3.  HLD:  -Last LDL, 91 -Continue atorvastatin    4. HTN: -continue current meds  - consider d/c norvasc  if edema worse   5. Breast cancer: -F/U oncology chemo/XRT Anti estrogen Letrozole  Stage 3A  Mammogram 02/04/23 normal On Prolia  for osteoporosis   6. Memory Loss:  subtle on Aricept  no excessive bradycardia   7. Peripheral Edema:  d/c norvasc  continue Maxide and PRN lasix  when home    Current medicines are reviewed at length with the patient today.  The patient does not have concerns regarding medicines.  The following changes have been made:  None  Labs/ tests ordered today include:    None  No orders of the defined types were placed in this encounter.    Disposition:   FU in 6 months   Signed, Maude Emmer, MD  09/21/2024 10:01 AM    [facility] [address] [place], [place], [place]  [address] Phone: [phone]; Fax: [phone]

## 2024-09-28 ENCOUNTER — Ambulatory Visit: Admitting: Cardiovascular Disease

## 2024-10-21 ENCOUNTER — Other Ambulatory Visit: Payer: Self-pay | Admitting: Cardiology

## 2024-10-21 DIAGNOSIS — I480 Paroxysmal atrial fibrillation: Secondary | ICD-10-CM

## 2024-10-21 NOTE — Telephone Encounter (Signed)
 Prescription refill request for Xarelto  received.  Indication:afib Last office visit:5/25 Weight:68.7  kg Age:83 Scr:1.57  2025 CrCl:29.45  ml/min  Prescription refilled

## 2024-11-26 NOTE — Progress Notes (Signed)
 "    Cardiology Office Note   Date:  12/04/2024   ID:  Christina Jacobs, DOB 07-01-1941, MRN [id number]  PCP:  Johnny Garnette LABOR, MD  Cardiologist:  Dr. Delford, MD  No chief complaint on file.   History of Present Illness: Christina Jacobs is a 84 y.o. female who presents for follow-up of paroxysmal atrial fibrillation diagnosed 12/2017 previously on Eliquis  however with an allergic reaction transition to Xarelto  for anticoagulation (low-dose secondary to CKD).  She previously underwent DCCV cardioversion however this failed and she was started on flecainide  after normal ETT 01/31/2018.  Echocardiogram from 12/2017 with an EF at 55 to 60% and no structural heart disease.  She ultimately underwent successful DCCV while on a AT 01/2018.  Post cardioversion, she wore a monitor which showed 99% NSR.  Also with a history of CKD stage III, HLD, HTN, hypothyroidism, myasthenia gravis and  diagnosis of breast cancer s/p left breast radioactive seed guided lumpectomy, sentinel lymph node biopsy and lymph node excision on 05/18/2021.with chemo and XRT done 08/2021   On low-dose Toprol  as higher doses exacerbates her myasthenia gravis.    She sees Gudena for breast cancer left lumpectomy 2022 with chemo and XRT completed 08/14/21  adjuvant Letrozole  antiestrogen Rx  Some insomnia using melatonin Still anxious about prognosis from cancer Had some palpitations with XRT but no PAF   Norvasc  added 07/02/22 She has been having more pedal edema Her nephrologist stopped her diuretic last year due to Cr 1.6 range Seen by Dr Johnny and med list includes Maxzide and Lasix  BMET 11/27/22 with Cr 1.39 BUN 26 and K 4.0   Discussed stopping Norvasc  with worse peripheral edema in future if needed  Started on Aricept  for memory loss by Dr Johnny  She was concerned about Prolia  and I told her lots of my patients were in it and tolerated well   Aricept  not helpful and d/c Edema in ankles Does not like lasix  due to incontinence  Discussed stopping Norvasc  Some stress after moving to smaller town home from primary residence   Sees neurology Diagnosed with hypothyroidism and myasthenia Gravis She has some dyspnea from large hiatal hernia that is non operative Previously on mestinon and now low dose prednisone . Primarily ocular symptoms    Downsized and move to a much smaller house Had a cabin crew  Past Medical History:  Diagnosis Date   Adenomatous colon polyp    Anal fissure    Anxiety    Asthma    Dr. Shellia   Atrial fibrillation [facility])    Bowel obstruction (HCC)    Bursitis    CKD (chronic kidney disease) stage 3, GFR 30-59 ml/min (HCC)    sees Dr. Andrez Scala    Complication of anesthesia    post op N/V   Diverticulosis    Dyspnea    After a meal   Dysrhythmia    Afib   GERD (gastroesophageal reflux disease)    Glaucoma    Hiatal hernia    Giant type IV hiatal hernia; sees Dr. Ranjan Sudan at [facility]   Hyperlipidemia    Hypertension    Hypothyroidism    Myasthenia gravis (HCC) 2013   followed at [facility] Dr Romero Chute, diplopia only   Osteoarthritis    see's Dr. Gaspar   Osteoporosis    last DEXA 08-03-11    Past Surgical History:  Procedure Laterality Date   ABDOMINAL HYSTERECTOMY     APPENDECTOMY  BREAST LUMPECTOMY Bilateral     several, enign breast lumps removed   BREAST LUMPECTOMY WITH RADIOACTIVE SEED AND SENTINEL LYMPH NODE BIOPSY Left 05/18/2021   Procedure: LEFT BREAST LUMPECTOMY WITH RADIOACTIVE SEED AND LEFT AXILLARY SENTINEL LYMPH NODE BIOPSY;  Surgeon: Ebbie Cough, MD;  Location: [facility] OR;  Service: General;  Laterality: Left;   CARDIOVERSION N/A 12/31/2017   Procedure: CARDIOVERSION;  Surgeon: Delford Maude BROCKS, MD;  Location: [facility] ENDOSCOPY;  Service: Cardiovascular;  Laterality: N/A;   CARDIOVERSION N/A 02/11/2018   Procedure: CARDIOVERSION;  Surgeon: Delford Maude BROCKS, MD;  Location: [facility] ENDOSCOPY;  Service: Cardiovascular;  Laterality: N/A;   COLONOSCOPY   09/27/2010   per Dr. Jakie, benign polyp, no repeats needed    EYE SURGERY     HERNIA REPAIR  10/18/2010   Dr. Gladis attempted but could reduce this, the entire stomach is above the diaphragm   PORT-A-CATH REMOVAL N/A 05/18/2021   Procedure: REMOVAL PORT-A-CATH;  Surgeon: Ebbie Cough, MD;  Location: [facility] OR;  Service: General;  Laterality: N/A;   PORTACATH PLACEMENT Right 01/25/2021   Procedure: INSERTION PORT-A-CATH;  Surgeon: Ebbie Cough, MD;  Location: [facility] ORS;  Service: General;  Laterality: Right;  START TIME OF 8:30 AM FOR 60 MINUTES ROOM 4 [place] IQ   RADIOACTIVE SEED GUIDED AXILLARY SENTINEL LYMPH NODE Left 05/18/2021   Procedure: RADIOACTIVE SEED GUIDED LEFT AXILLARY NODE EXCISION;  Surgeon: Ebbie Cough, MD;  Location: [facility] OR;  Service: General;  Laterality: Left;   THYROIDECTOMY, PARTIAL  1999   TONSILLECTOMY       Current Outpatient Medications  Medication Sig Dispense Refill   albuterol  (VENTOLIN  HFA) 108 (90 Base) MCG/ACT inhaler Inhale 2 puffs into the lungs every 4 (four) hours as needed for wheezing or shortness of breath. 18 each 5   atorvastatin  (LIPITOR) 10 MG tablet Take 1 tablet (10 mg total) by mouth daily. 90 tablet 1   flecainide  (TAMBOCOR ) 50 MG tablet Take 1 tablet (50 mg total) by mouth 2 (two) times daily. 180 tablet 3   furosemide  (LASIX ) 20 MG tablet Take 1 tablet (20 mg total) by mouth daily as needed for edema. 30 tablet 3   latanoprost (XALATAN) 0.005 % ophthalmic solution Place 1 drop into both eyes at bedtime.     letrozole  (FEMARA ) 2.5 MG tablet TAKE 1 TABLET BY MOUTH EVERY DAY 90 tablet 3   levothyroxine  (SYNTHROID ) 100 MCG tablet TAKE 1 TABLET BY MOUTH EVERY DAY 90 tablet 3   metoprolol  succinate (TOPROL  XL) 50 MG 24 hr tablet Take 1 tablet (50 mg total) by mouth daily. Take with or immediately following a meal. 90 tablet 3   Multiple Vitamin (MULTIVITAMIN WITH MINERALS) TABS tablet Take 1 tablet by mouth in the morning. Centrum  for Women     omeprazole  (PRILOSEC) 40 MG capsule TAKE 1 CAPSULE BY MOUTH EVERY DAY AS NEEDED FOR REFLUX 90 capsule 3   predniSONE  (DELTASONE ) 1 MG tablet Take 4 tablets (4 mg total) by mouth daily with breakfast.     timolol (TIMOPTIC) 0.5 % ophthalmic solution Place 1 drop into both eyes in the morning and at bedtime.     triamterene -hydrochlorothiazide (MAXZIDE-25) 37.5-25 MG tablet TAKE 1 TABLET BY MOUTH EVERY DAY 90 tablet 3   XARELTO  15 MG TABS tablet TAKE 1 TABLET (15 MG TOTAL) BY MOUTH DAILY WITH SUPPER 30 tablet 5   No current facility-administered medications for this visit.    Allergies:   Codeine, Eliquis  [apixaban ], Meperidine hcl, Propoxyphene hcl, and  Tape    Social History:  The patient  reports that she has never smoked. She has never used smokeless tobacco. She reports that she does not drink alcohol and does not use drugs.   Family History:  The patient's family history includes Breast cancer in her maternal grandmother; Cancer in her brother, father, and sister; Clotting disorder in her brother; Colon cancer (age of onset: 65) in her son; Diabetes in her father; Hyperlipidemia in her father; Hypertension in her father; Kidney cancer in her mother.    ROS:  Please see the history of present illness. Otherwise, review of systems are positive for none.   All other systems are reviewed and negative.    PHYSICAL EXAM: VS:  There were no vitals taken for this visit. , BMI There is no height or weight on file to calculate BMI.  Affect appropriate Healthy:  appears stated age HEENT: normal Neck supple with no adenopathy JVP normal no bruits no thyromegaly Lungs clear with no wheezing and good diaphragmatic motion Heart:  S1/S2 no murmur, no rub, gallop or click PMI normal Abdomen: benighn, BS positve, no tenderness, no AAA no bruit.  No HSM or HJR Distal pulses intact with no bruits No edema Neuro non-focal Skin warm and dry No muscular weakness    EKG:  SR rate  61 nonspecific ST changes 01/23/21   Recent Labs: 06/10/2024: ALT 14; BUN 24; Creatinine 1.57; Hemoglobin 14.4; Platelet Count 160; Potassium 4.5; Sodium 141    Lipid Panel    Component Value Date/Time   CHOL 200 11/27/2022 1026   TRIG 115.0 11/27/2022 1026   HDL 59.90 11/27/2022 1026   CHOLHDL 3 11/27/2022 1026   VLDL 23.0 11/27/2022 1026   LDLCALC 117 (H) 11/27/2022 1026   LDLCALC 91 08/23/2020 1144   LDLDIRECT 142.2 07/27/2013 1007      Wt Readings from Last 3 Encounters:  06/10/24 151 lb 8 oz (68.7 kg)  03/13/24 151 lb (68.5 kg)  07/01/23 154 lb (69.9 kg)     ASSESSMENT AND PLAN:  1. PAF: -Maintaining NSR  -Continue Flecainide , Xarelto , and beta blocker  -No s/s of bleeding in stool or urine.  -CBC stable on recent labs -On reduced dose Xarelto  due to renal dysfunction  - update echo given AAT and some dyspnea   2. CKD stage III: -Creatinine, 1.57 -Follows with nephrology  Dr Clayborn     3.  HLD:  -Last LDL, 91 -Continue atorvastatin    4. HTN: -continue current meds  - consider d/c norvasc  if edema worse   5. Breast cancer: -F/U oncology chemo/XRT Anti estrogen Letrozole  Stage 3A  Mammogram 02/10/24 normal post left lumpectomy On Prolia  for osteoporosis   6. Memory Loss:  subtle on Aricept  no excessive bradycardia   7. Peripheral Edema: Improved off Norvasc  continue Maxide and PRN lasix  when home   8. 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 Hypothyroidism:  on synthroid  replacement TSH normal    Current medicines are reviewed at length with the patient today.  The patient does not have concerns regarding medicines.  The following changes have been made:  None  Labs/ tests ordered today include:    Echo for PAF/Dyspnea   Disposition:   FU in 6 months   Signed, Maude Emmer, MD  12/04/2024 2:20 PM    [facility] [address] [place], [place], [place]  [address] Phone: [phone]; Fax: [phone]  "

## 2024-12-04 ENCOUNTER — Encounter: Payer: Self-pay | Admitting: Cardiovascular Disease

## 2024-12-04 ENCOUNTER — Ambulatory Visit: Attending: Cardiovascular Disease | Admitting: Cardiovascular Disease

## 2024-12-04 VITALS — BP 137/83 | HR 76 | Ht 65.0 in | Wt 152.8 lb

## 2024-12-04 DIAGNOSIS — I480 Paroxysmal atrial fibrillation: Secondary | ICD-10-CM

## 2024-12-04 DIAGNOSIS — I10 Essential (primary) hypertension: Secondary | ICD-10-CM

## 2024-12-04 DIAGNOSIS — R600 Localized edema: Secondary | ICD-10-CM

## 2024-12-04 DIAGNOSIS — R0600 Dyspnea, unspecified: Secondary | ICD-10-CM | POA: Diagnosis not present

## 2024-12-04 DIAGNOSIS — E785 Hyperlipidemia, unspecified: Secondary | ICD-10-CM

## 2024-12-04 NOTE — Patient Instructions (Signed)
 Medication Instructions:  Your physician recommends that you continue on your current medications as directed. Please refer to the Current Medication list given to you today.  *If you need a refill on your cardiac medications before your next appointment, please call your pharmacy*  Lab Work: None ordered If you have labs (blood work) drawn today and your tests are completely normal, you will receive your results only by: MyChart Message (if you have MyChart) OR A paper copy in the mail If you have any lab test that is abnormal or we need to change your treatment, we will call you to review the results.  Testing/Procedures: ECHOCARDIOGRAM  Follow-Up: At [facility], you and your health needs are our priority.  As part of our continuing mission to provide you with exceptional heart care, our providers are all part of one team.  This team includes your primary Cardiologist (physician) and Advanced Practice Providers or APPs (Physician Assistants and Nurse Practitioners) who all work together to provide you with the care you need, when you need it.  Your next appointment:   6 month(s)  Provider:   Maude Emmer, MD    We recommend signing up for the patient portal called MyChart.  Sign up information is provided on this After Visit Summary.  MyChart is used to connect with patients for Virtual Visits (Telemedicine).  Patients are able to view lab/test results, encounter notes, upcoming appointments, etc.  Non-urgent messages can be sent to your provider as well.   To learn more about what you can do with MyChart, go to [email or web address].   Other Instructions Your physician has requested that you have an echocardiogram. Echocardiography is a painless test that uses sound waves to create images of your heart. It provides your doctor with information about the size and shape of your heart and how well your hearts chambers and valves are working. This procedure takes  approximately one hour. There are no restrictions for this procedure. Please do NOT wear cologne, perfume, aftershave, or lotions (deodorant is allowed). Please arrive 15 minutes prior to your appointment time.  Please note: We ask at that you not bring children with you during ultrasound (echo/ vascular) testing. Due to room size and safety concerns, children are not allowed in the ultrasound rooms during exams. Our front office staff cannot provide observation of children in our lobby area while testing is being conducted. An adult accompanying a patient to their appointment will only be allowed in the ultrasound room at the discretion of the ultrasound technician under special circumstances. We apologize for any inconvenience.

## 2024-12-11 ENCOUNTER — Inpatient Hospital Stay

## 2024-12-11 ENCOUNTER — Other Ambulatory Visit: Payer: Self-pay

## 2024-12-11 VITALS — BP 148/96 | HR 97 | Resp 17

## 2024-12-11 DIAGNOSIS — C50412 Malignant neoplasm of upper-outer quadrant of left female breast: Secondary | ICD-10-CM

## 2024-12-11 DIAGNOSIS — M810 Age-related osteoporosis without current pathological fracture: Secondary | ICD-10-CM

## 2024-12-11 LAB — CBC WITH DIFFERENTIAL (CANCER CENTER ONLY)
Abs Immature Granulocytes: 0.02 10*3/uL (ref 0.00–0.07)
Basophils Absolute: 0 10*3/uL (ref 0.0–0.1)
Basophils Relative: 0 %
Eosinophils Absolute: 0.1 10*3/uL (ref 0.0–0.5)
Eosinophils Relative: 2 %
HCT: 44.5 % (ref 36.0–46.0)
Hemoglobin: 14.9 g/dL (ref 12.0–15.0)
Immature Granulocytes: 0 %
Lymphocytes Relative: 26 %
Lymphs Abs: 1.6 10*3/uL (ref 0.7–4.0)
MCH: 30.8 pg (ref 26.0–34.0)
MCHC: 33.5 g/dL (ref 30.0–36.0)
MCV: 91.9 fL (ref 80.0–100.0)
Monocytes Absolute: 0.5 10*3/uL (ref 0.1–1.0)
Monocytes Relative: 8 %
Neutro Abs: 3.9 10*3/uL (ref 1.7–7.7)
Neutrophils Relative %: 64 %
Platelet Count: 146 10*3/uL — ABNORMAL LOW (ref 150–400)
RBC: 4.84 MIL/uL (ref 3.87–5.11)
RDW: 14.7 % (ref 11.5–15.5)
WBC Count: 6.2 10*3/uL (ref 4.0–10.5)
nRBC: 0 % (ref 0.0–0.2)

## 2024-12-11 LAB — CMP (CANCER CENTER ONLY)
ALT: 27 U/L (ref 0–44)
AST: 25 U/L (ref 15–41)
Albumin: 3.9 g/dL (ref 3.5–5.0)
Alkaline Phosphatase: 49 U/L (ref 38–126)
Anion gap: 10 (ref 5–15)
BUN: 28 mg/dL — ABNORMAL HIGH (ref 8–23)
CO2: 27 mmol/L (ref 22–32)
Calcium: 9.6 mg/dL (ref 8.9–10.3)
Chloride: 106 mmol/L (ref 98–111)
Creatinine: 1.58 mg/dL — ABNORMAL HIGH (ref 0.44–1.00)
GFR, Estimated: 32 mL/min — ABNORMAL LOW
Glucose, Bld: 93 mg/dL (ref 70–99)
Potassium: 4 mmol/L (ref 3.5–5.1)
Sodium: 143 mmol/L (ref 135–145)
Total Bilirubin: 1.1 mg/dL (ref 0.0–1.2)
Total Protein: 6.9 g/dL (ref 6.5–8.1)

## 2024-12-11 MED ORDER — SODIUM CHLORIDE 0.9 % IV SOLN: Freq: Once | INTRAVENOUS | Status: DC

## 2024-12-11 MED ORDER — DENOSUMAB 60 MG/ML ~~LOC~~ SOSY
60.0000 mg | PREFILLED_SYRINGE | Freq: Once | SUBCUTANEOUS | Status: AC
  Administered 2024-12-11: 60 mg via SUBCUTANEOUS
  Filled 2024-12-11: qty 1

## 2025-01-06 ENCOUNTER — Other Ambulatory Visit (HOSPITAL_BASED_OUTPATIENT_CLINIC_OR_DEPARTMENT_OTHER)

## 2025-06-10 ENCOUNTER — Ambulatory Visit: Admitting: Hematology and Oncology
